# Patient Record
Sex: Female | Born: 1935 | Race: Black or African American | Hispanic: No | Smoking: Never smoker
Health system: Southern US, Community
[De-identification: ages and names within clinical notes are randomized; demographics above are authoritative.]

## PROBLEM LIST (undated history)

## (undated) DIAGNOSIS — Z8489 Family history of other specified conditions: Secondary | ICD-10-CM

## (undated) DIAGNOSIS — I219 Acute myocardial infarction, unspecified: Secondary | ICD-10-CM

## (undated) DIAGNOSIS — I350 Nonrheumatic aortic (valve) stenosis: Secondary | ICD-10-CM

## (undated) DIAGNOSIS — I2510 Atherosclerotic heart disease of native coronary artery without angina pectoris: Secondary | ICD-10-CM

## (undated) DIAGNOSIS — E785 Hyperlipidemia, unspecified: Secondary | ICD-10-CM

## (undated) DIAGNOSIS — I10 Essential (primary) hypertension: Secondary | ICD-10-CM

## (undated) DIAGNOSIS — Z8701 Personal history of pneumonia (recurrent): Secondary | ICD-10-CM

## (undated) DIAGNOSIS — M1990 Unspecified osteoarthritis, unspecified site: Secondary | ICD-10-CM

## (undated) DIAGNOSIS — R0602 Shortness of breath: Secondary | ICD-10-CM

## (undated) HISTORY — PX: KNEE ARTHROSCOPY: SUR90

## (undated) HISTORY — DX: Personal history of pneumonia (recurrent): Z87.01

## (undated) HISTORY — PX: PARTIAL HYSTERECTOMY: SHX80

## (undated) HISTORY — DX: Essential (primary) hypertension: I10

## (undated) HISTORY — DX: Atherosclerotic heart disease of native coronary artery without angina pectoris: I25.10

## (undated) HISTORY — DX: Acute myocardial infarction, unspecified: I21.9

## (undated) HISTORY — DX: Hyperlipidemia, unspecified: E78.5

## (undated) HISTORY — PX: TONSILLECTOMY: SUR1361

## (undated) HISTORY — DX: Nonrheumatic aortic (valve) stenosis: I35.0

## (undated) HISTORY — DX: Unspecified osteoarthritis, unspecified site: M19.90

---

## 1997-08-30 ENCOUNTER — Encounter: Admission: RE | Admit: 1997-08-30 | Discharge: 1997-11-28 | Payer: Self-pay | Admitting: Orthopedic Surgery

## 1997-11-26 ENCOUNTER — Encounter: Payer: Self-pay | Admitting: Orthopedic Surgery

## 1997-11-26 ENCOUNTER — Ambulatory Visit (HOSPITAL_COMMUNITY): Admission: RE | Admit: 1997-11-26 | Discharge: 1997-11-26 | Payer: Self-pay | Admitting: Orthopedic Surgery

## 1997-12-22 ENCOUNTER — Encounter: Admission: RE | Admit: 1997-12-22 | Discharge: 1998-02-02 | Payer: Self-pay | Admitting: Orthopedic Surgery

## 1998-01-21 ENCOUNTER — Ambulatory Visit (HOSPITAL_COMMUNITY): Admission: RE | Admit: 1998-01-21 | Discharge: 1998-01-21 | Payer: Self-pay | Admitting: Cardiology

## 1998-01-21 ENCOUNTER — Encounter: Payer: Self-pay | Admitting: Cardiology

## 1998-02-08 ENCOUNTER — Encounter: Payer: Self-pay | Admitting: Orthopedic Surgery

## 1998-02-08 ENCOUNTER — Ambulatory Visit (HOSPITAL_COMMUNITY): Admission: RE | Admit: 1998-02-08 | Discharge: 1998-02-09 | Payer: Self-pay | Admitting: Orthopedic Surgery

## 1998-03-14 ENCOUNTER — Encounter: Admission: RE | Admit: 1998-03-14 | Discharge: 1998-04-13 | Payer: Self-pay | Admitting: Orthopedic Surgery

## 2004-03-27 ENCOUNTER — Ambulatory Visit: Payer: Self-pay | Admitting: Internal Medicine

## 2004-07-20 ENCOUNTER — Ambulatory Visit: Payer: Self-pay | Admitting: Internal Medicine

## 2004-10-24 ENCOUNTER — Ambulatory Visit: Payer: Self-pay | Admitting: Internal Medicine

## 2005-02-01 ENCOUNTER — Ambulatory Visit: Payer: Self-pay | Admitting: Internal Medicine

## 2005-05-28 ENCOUNTER — Ambulatory Visit: Payer: Self-pay | Admitting: Internal Medicine

## 2005-07-23 ENCOUNTER — Ambulatory Visit: Payer: Self-pay | Admitting: Internal Medicine

## 2005-11-05 ENCOUNTER — Ambulatory Visit: Payer: Self-pay | Admitting: Internal Medicine

## 2005-12-03 ENCOUNTER — Ambulatory Visit: Payer: Self-pay | Admitting: Internal Medicine

## 2005-12-25 ENCOUNTER — Ambulatory Visit: Payer: Self-pay

## 2006-01-07 ENCOUNTER — Ambulatory Visit: Payer: Self-pay | Admitting: Internal Medicine

## 2006-01-07 LAB — CONVERTED CEMR LAB
BUN: 5 mg/dL — ABNORMAL LOW (ref 6–23)
Potassium: 4 meq/L (ref 3.5–5.1)

## 2006-02-19 DIAGNOSIS — Z8701 Personal history of pneumonia (recurrent): Secondary | ICD-10-CM

## 2006-02-19 HISTORY — DX: Personal history of pneumonia (recurrent): Z87.01

## 2006-04-01 ENCOUNTER — Ambulatory Visit: Payer: Self-pay | Admitting: Internal Medicine

## 2006-06-28 ENCOUNTER — Ambulatory Visit: Payer: Self-pay | Admitting: Internal Medicine

## 2006-07-16 ENCOUNTER — Ambulatory Visit: Payer: Self-pay | Admitting: Internal Medicine

## 2006-07-16 LAB — CONVERTED CEMR LAB
ALT: 23 units/L (ref 0–40)
AST: 39 units/L — ABNORMAL HIGH (ref 0–37)
Basophils Relative: 0.5 % (ref 0.0–1.0)
Bilirubin, Direct: 0.2 mg/dL (ref 0.0–0.3)
CO2: 30 meq/L (ref 19–32)
Calcium: 9.4 mg/dL (ref 8.4–10.5)
Chloride: 106 meq/L (ref 96–112)
Creatinine, Ser: 1.1 mg/dL (ref 0.4–1.2)
Eosinophils Relative: 6.9 % — ABNORMAL HIGH (ref 0.0–5.0)
Glucose, Bld: 130 mg/dL — ABNORMAL HIGH (ref 70–99)
Ketones, ur: NEGATIVE mg/dL
Lymphocytes Relative: 25.7 % (ref 12.0–46.0)
Nitrite: NEGATIVE
Platelets: 253 10*3/uL (ref 150–400)
RBC: 4.35 M/uL (ref 3.87–5.11)
RDW: 12.2 % (ref 11.5–14.6)
Specific Gravity, Urine: 1.025 (ref 1.000–1.03)
Total CHOL/HDL Ratio: 2.6
Total Protein: 7.2 g/dL (ref 6.0–8.3)
Urine Glucose: NEGATIVE mg/dL
WBC: 5.3 10*3/uL (ref 4.5–10.5)

## 2006-07-24 ENCOUNTER — Ambulatory Visit: Payer: Self-pay | Admitting: Internal Medicine

## 2006-11-06 ENCOUNTER — Ambulatory Visit: Payer: Self-pay | Admitting: Internal Medicine

## 2006-11-06 ENCOUNTER — Ambulatory Visit: Payer: Self-pay | Admitting: Family Medicine

## 2006-11-14 ENCOUNTER — Ambulatory Visit: Payer: Self-pay

## 2006-11-22 ENCOUNTER — Ambulatory Visit: Payer: Self-pay | Admitting: Internal Medicine

## 2007-01-29 ENCOUNTER — Encounter: Payer: Self-pay | Admitting: Internal Medicine

## 2007-03-13 ENCOUNTER — Telehealth (INDEPENDENT_AMBULATORY_CARE_PROVIDER_SITE_OTHER): Payer: Self-pay | Admitting: *Deleted

## 2007-03-17 ENCOUNTER — Ambulatory Visit: Payer: Self-pay | Admitting: Internal Medicine

## 2007-03-17 DIAGNOSIS — E785 Hyperlipidemia, unspecified: Secondary | ICD-10-CM

## 2007-03-17 DIAGNOSIS — J069 Acute upper respiratory infection, unspecified: Secondary | ICD-10-CM | POA: Insufficient documentation

## 2007-03-17 DIAGNOSIS — N760 Acute vaginitis: Secondary | ICD-10-CM | POA: Insufficient documentation

## 2007-03-17 DIAGNOSIS — I10 Essential (primary) hypertension: Secondary | ICD-10-CM | POA: Insufficient documentation

## 2007-04-14 ENCOUNTER — Encounter: Payer: Self-pay | Admitting: Internal Medicine

## 2007-04-14 ENCOUNTER — Telehealth: Payer: Self-pay | Admitting: Internal Medicine

## 2007-07-28 ENCOUNTER — Encounter: Payer: Self-pay | Admitting: Internal Medicine

## 2007-07-28 DIAGNOSIS — R930 Abnormal findings on diagnostic imaging of skull and head, not elsewhere classified: Secondary | ICD-10-CM

## 2007-07-29 ENCOUNTER — Ambulatory Visit: Payer: Self-pay | Admitting: Internal Medicine

## 2007-08-04 ENCOUNTER — Encounter: Payer: Self-pay | Admitting: Internal Medicine

## 2007-08-07 ENCOUNTER — Ambulatory Visit: Payer: Self-pay | Admitting: Cardiology

## 2007-11-17 ENCOUNTER — Encounter: Payer: Self-pay | Admitting: Internal Medicine

## 2008-01-16 ENCOUNTER — Telehealth (INDEPENDENT_AMBULATORY_CARE_PROVIDER_SITE_OTHER): Payer: Self-pay | Admitting: *Deleted

## 2008-01-16 ENCOUNTER — Telehealth: Payer: Self-pay | Admitting: Internal Medicine

## 2008-02-04 ENCOUNTER — Ambulatory Visit: Payer: Self-pay | Admitting: Internal Medicine

## 2008-02-04 DIAGNOSIS — M25569 Pain in unspecified knee: Secondary | ICD-10-CM | POA: Insufficient documentation

## 2008-03-11 ENCOUNTER — Ambulatory Visit: Payer: Self-pay | Admitting: Internal Medicine

## 2008-03-11 DIAGNOSIS — R609 Edema, unspecified: Secondary | ICD-10-CM

## 2008-03-11 DIAGNOSIS — M1990 Unspecified osteoarthritis, unspecified site: Secondary | ICD-10-CM | POA: Insufficient documentation

## 2008-03-30 ENCOUNTER — Telehealth: Payer: Self-pay | Admitting: Internal Medicine

## 2008-04-07 ENCOUNTER — Encounter: Payer: Self-pay | Admitting: Internal Medicine

## 2008-04-21 ENCOUNTER — Ambulatory Visit: Payer: Self-pay | Admitting: Internal Medicine

## 2008-04-23 ENCOUNTER — Ambulatory Visit: Payer: Self-pay | Admitting: Internal Medicine

## 2008-08-30 ENCOUNTER — Ambulatory Visit: Payer: Self-pay | Admitting: Internal Medicine

## 2008-08-30 LAB — CONVERTED CEMR LAB
ALT: 17 units/L (ref 0–35)
AST: 28 units/L (ref 0–37)
BUN: 8 mg/dL (ref 6–23)
Basophils Absolute: 0 10*3/uL (ref 0.0–0.1)
Basophils Relative: 0.5 % (ref 0.0–3.0)
Bilirubin Urine: NEGATIVE
Bilirubin, Direct: 0.1 mg/dL (ref 0.0–0.3)
CO2: 30 meq/L (ref 19–32)
Chloride: 106 meq/L (ref 96–112)
Cholesterol: 220 mg/dL — ABNORMAL HIGH (ref 0–200)
Creatinine, Ser: 0.9 mg/dL (ref 0.4–1.2)
Direct LDL: 125 mg/dL
Eosinophils Relative: 7 % — ABNORMAL HIGH (ref 0.0–5.0)
HCT: 41.9 % (ref 36.0–46.0)
Hemoglobin: 14.5 g/dL (ref 12.0–15.0)
Ketones, ur: NEGATIVE mg/dL
Lymphs Abs: 1.3 10*3/uL (ref 0.7–4.0)
Monocytes Relative: 9.7 % (ref 3.0–12.0)
Neutro Abs: 3.1 10*3/uL (ref 1.4–7.7)
Potassium: 3.6 meq/L (ref 3.5–5.1)
RBC: 4.52 M/uL (ref 3.87–5.11)
RDW: 12.5 % (ref 11.5–14.6)
Specific Gravity, Urine: 1.01 (ref 1.000–1.030)
TSH: 1.45 microintl units/mL (ref 0.35–5.50)
Total Protein: 7.5 g/dL (ref 6.0–8.3)
Triglycerides: 185 mg/dL — ABNORMAL HIGH (ref 0.0–149.0)
Urine Glucose: NEGATIVE mg/dL
Vit D, 25-Hydroxy: 38 ng/mL (ref 30–89)
pH: 7 (ref 5.0–8.0)

## 2008-09-02 ENCOUNTER — Ambulatory Visit: Payer: Self-pay | Admitting: Internal Medicine

## 2008-09-02 DIAGNOSIS — R739 Hyperglycemia, unspecified: Secondary | ICD-10-CM

## 2008-11-10 ENCOUNTER — Telehealth: Payer: Self-pay | Admitting: Internal Medicine

## 2008-11-10 ENCOUNTER — Encounter: Payer: Self-pay | Admitting: Internal Medicine

## 2009-02-07 ENCOUNTER — Ambulatory Visit: Payer: Self-pay | Admitting: Internal Medicine

## 2009-02-08 ENCOUNTER — Telehealth: Payer: Self-pay | Admitting: Internal Medicine

## 2009-02-08 LAB — CONVERTED CEMR LAB
AST: 30 units/L (ref 0–37)
Albumin: 4 g/dL (ref 3.5–5.2)
Alkaline Phosphatase: 64 units/L (ref 39–117)
Calcium: 9.6 mg/dL (ref 8.4–10.5)
GFR calc non Af Amer: 69.91 mL/min (ref >60)
HDL: 59 mg/dL (ref >39.00)
Hgb A1c MFr Bld: 6.1 % (ref 4.6–6.5)
LDL Cholesterol: 106 mg/dL — ABNORMAL HIGH (ref 0–99)
Potassium: 3.1 meq/L — ABNORMAL LOW (ref 3.5–5.1)
Sodium: 140 meq/L (ref 135–145)
Total Bilirubin: 1 mg/dL (ref 0.3–1.2)
VLDL: 20.2 mg/dL (ref 0.0–40.0)

## 2009-02-17 ENCOUNTER — Ambulatory Visit: Payer: Self-pay | Admitting: Internal Medicine

## 2009-02-17 DIAGNOSIS — E876 Hypokalemia: Secondary | ICD-10-CM

## 2009-02-17 DIAGNOSIS — M2550 Pain in unspecified joint: Secondary | ICD-10-CM

## 2009-05-12 ENCOUNTER — Ambulatory Visit: Payer: Self-pay | Admitting: Internal Medicine

## 2009-05-12 LAB — CONVERTED CEMR LAB
CO2: 29 meq/L (ref 19–32)
Calcium: 9.7 mg/dL (ref 8.4–10.5)
Cholesterol: 290 mg/dL — ABNORMAL HIGH (ref 0–200)
Creatinine, Ser: 0.8 mg/dL (ref 0.4–1.2)
Direct LDL: 174 mg/dL
GFR calc non Af Amer: 90.37 mL/min (ref >60)
Glucose, Bld: 119 mg/dL — ABNORMAL HIGH (ref 70–99)
Sodium: 142 meq/L (ref 135–145)
Total CHOL/HDL Ratio: 5
Triglycerides: 290 mg/dL — ABNORMAL HIGH (ref 0.0–149.0)

## 2009-05-19 ENCOUNTER — Ambulatory Visit: Payer: Self-pay | Admitting: Internal Medicine

## 2009-07-04 ENCOUNTER — Ambulatory Visit: Payer: Self-pay | Admitting: Internal Medicine

## 2009-07-04 DIAGNOSIS — H9209 Otalgia, unspecified ear: Secondary | ICD-10-CM | POA: Insufficient documentation

## 2009-07-04 DIAGNOSIS — G500 Trigeminal neuralgia: Secondary | ICD-10-CM | POA: Insufficient documentation

## 2009-07-19 ENCOUNTER — Telehealth: Payer: Self-pay | Admitting: Internal Medicine

## 2009-09-02 ENCOUNTER — Ambulatory Visit: Payer: Self-pay | Admitting: Internal Medicine

## 2009-09-02 LAB — CONVERTED CEMR LAB
Albumin: 4 g/dL (ref 3.5–5.2)
CO2: 29 meq/L (ref 19–32)
Chloride: 111 meq/L (ref 96–112)
Cholesterol: 191 mg/dL (ref 0–200)
Hgb A1c MFr Bld: 6 % (ref 4.6–6.5)
LDL Cholesterol: 101 mg/dL — ABNORMAL HIGH (ref 0–99)
Potassium: 3.6 meq/L (ref 3.5–5.1)
Total CHOL/HDL Ratio: 3
Total Protein: 6.9 g/dL (ref 6.0–8.3)
Triglycerides: 130 mg/dL (ref 0.0–149.0)
VLDL: 26 mg/dL (ref 0.0–40.0)

## 2009-09-13 ENCOUNTER — Telehealth: Payer: Self-pay | Admitting: Internal Medicine

## 2009-09-14 ENCOUNTER — Ambulatory Visit: Payer: Self-pay | Admitting: Internal Medicine

## 2009-12-05 ENCOUNTER — Ambulatory Visit: Payer: Self-pay | Admitting: Internal Medicine

## 2009-12-05 ENCOUNTER — Encounter: Payer: Self-pay | Admitting: Internal Medicine

## 2009-12-05 DIAGNOSIS — J40 Bronchitis, not specified as acute or chronic: Secondary | ICD-10-CM | POA: Insufficient documentation

## 2010-01-02 ENCOUNTER — Telehealth: Payer: Self-pay | Admitting: Internal Medicine

## 2010-01-18 ENCOUNTER — Encounter: Payer: Self-pay | Admitting: Internal Medicine

## 2010-01-18 ENCOUNTER — Ambulatory Visit: Payer: Self-pay | Admitting: Internal Medicine

## 2010-01-18 DIAGNOSIS — R32 Unspecified urinary incontinence: Secondary | ICD-10-CM

## 2010-01-18 DIAGNOSIS — R509 Fever, unspecified: Secondary | ICD-10-CM

## 2010-01-18 DIAGNOSIS — R079 Chest pain, unspecified: Secondary | ICD-10-CM

## 2010-01-19 LAB — CONVERTED CEMR LAB
Nitrite: POSITIVE
Specific Gravity, Urine: >=1.03 (ref 1.000–1.030)
Urine Glucose: NEGATIVE mg/dL
Urobilinogen, UA: 0.2 (ref 0.0–1.0)

## 2010-01-31 LAB — CONVERTED CEMR LAB
AST: 25 units/L (ref 0–37)
Albumin: 3.9 g/dL (ref 3.5–5.2)
Alkaline Phosphatase: 82 units/L (ref 39–117)
Basophils Absolute: 0.1 10*3/uL (ref 0.0–0.1)
Basophils Relative: 0.9 % (ref 0.0–3.0)
Calcium: 9.9 mg/dL (ref 8.4–10.5)
Direct LDL: 168 mg/dL
Eosinophils Relative: 7.4 % — ABNORMAL HIGH (ref 0.0–5.0)
GFR calc non Af Amer: 80.8 mL/min (ref >60.00)
HDL: 56.2 mg/dL (ref >39.00)
Hemoglobin: 14 g/dL (ref 12.0–15.0)
Ketones, ur: NEGATIVE mg/dL
Lymphocytes Relative: 22.9 % (ref 12.0–46.0)
Monocytes Relative: 12.7 % — ABNORMAL HIGH (ref 3.0–12.0)
Neutro Abs: 3.2 10*3/uL (ref 1.4–7.7)
RBC: 4.46 M/uL (ref 3.87–5.11)
RDW: 13.4 % (ref 11.5–14.6)
Sodium: 143 meq/L (ref 135–145)
Specific Gravity, Urine: 1.02 (ref 1.000–1.030)
Total CHOL/HDL Ratio: 5
Triglycerides: 174 mg/dL — ABNORMAL HIGH (ref 0.0–149.0)
Urine Glucose: NEGATIVE mg/dL
Urobilinogen, UA: 0.2 (ref 0.0–1.0)
WBC: 5.7 10*3/uL (ref 4.5–10.5)

## 2010-02-02 ENCOUNTER — Ambulatory Visit: Payer: Self-pay | Admitting: Internal Medicine

## 2010-02-09 ENCOUNTER — Telehealth: Payer: Self-pay | Admitting: Internal Medicine

## 2010-02-09 ENCOUNTER — Ambulatory Visit: Payer: Self-pay | Admitting: Internal Medicine

## 2010-02-09 DIAGNOSIS — I2510 Atherosclerotic heart disease of native coronary artery without angina pectoris: Secondary | ICD-10-CM

## 2010-02-16 ENCOUNTER — Encounter: Payer: Self-pay | Admitting: Internal Medicine

## 2010-03-21 NOTE — Progress Notes (Signed)
Summary: temazepam  Phone Note Refill Request Message from:  Scriptline on Jul 19, 2009 9:39 AM  Refills Requested: Medication #1:  TEMAZEPAM 15 MG CAPS 1 to 2 at bedtime as needed # [address] [facility] [place] [phone] Last ov 07/04/09  Next Appointment Scheduled: 09/14/09 Initial call taken by: Orlan Leavens,  Jul 19, 2009 9:41 AM Caller: [facility] #[id number]*  Follow-up for Phone Call        Is this ok to refill ? Follow-up by: Orlan Leavens,  Jul 19, 2009 9:41 AM  Additional Follow-up for Phone Call Additional follow up Details #1::        ok 6 ref Additional Follow-up by: Tresa Garter MD,  Jul 19, 2009 2:23 PM    Additional Follow-up for Phone Call Additional follow up Details #2::    Notified pharm spoke with Abbie ok # 60 with 5 refills Follow-up by: Orlan Leavens,  Jul 19, 2009 3:36 PM  Prescriptions: TEMAZEPAM 15 MG CAPS (TEMAZEPAM) 1 to 2 at bedtime as needed  #60 x 5   Entered by:   Orlan Leavens   Authorized by:   Tresa Garter MD   Signed by:   Orlan Leavens on 07/19/2009   Method used:   Telephoned to ...       [facility]. [phone]* (retail)       [address]       [place], [place]  [address]       Ph: [phone]       Fax: [phone]   RxID:   [id number]

## 2010-03-21 NOTE — Assessment & Plan Note (Signed)
Summary: 4 MO ROV /NWS  #   Vital Signs:  Patient profile:   76 year old female Height:      63 inches Weight:      181 pounds BMI:     32.18 O2 Sat:      96 % on Room air Temp:     98.5 degrees F oral Pulse rate:   75 / minute Pulse rhythm:   regular Resp:     16 per minute BP sitting:   140 / 70  (left arm) Cuff size:   regular  Vitals Entered By: Lanier Prude, CMA(AAMA) (September 14, 2009 10:06 AM)  O2 Flow:  Room air CC: 4 MO F/U Is Patient Diabetic? No Comments pt is currently taking Lyrica but soon will change to gabapentin when she completes Lyrica due to non coverage   CC:  4 MO F/U.  History of Present Illness: The patient presents for a follow up of hypertension, diabetes, hyperlipidemia, knee pain   Current Medications (verified): 1)  Maxzide-25 37.5-25 Mg Tabs (Triamterene-Hctz) .Marland Kitchen.. 1 Once Daily 2)  Norvasc 5 Mg Tabs (Amlodipine Besylate) .Marland Kitchen.. 1 Once Daily 3)  Temazepam 15 Mg Caps (Temazepam) .Marland Kitchen.. 1 To 2 At Bedtime As Needed 4)  Alprazolam 0.25 Mg Tabs (Alprazolam) .Marland Kitchen.. 1 Two Times A Day As Needed For Anxiety 5)  Vitamin D3 1000 Unit  Tabs (Cholecalciferol) .Marland Kitchen.. 1 Qd 6)  Hydrocodone-Acetaminophen 5-325 Mg Tabs (Hydrocodone-Acetaminophen) .Marland Kitchen.. 1 By Mouth Up To 4 Times Per Day As Needed For Pain 7)  Aspirin 81 Mg Tabs (Aspirin) .... Once Daily 8)  Toprol Xl 100 Mg Xr24h-Tab (Metoprolol Succinate) .... Once Daily 9)  Zantac 150 Maximum Strength 150 Mg Tabs (Ranitidine Hcl) .... Once Daily 10)  Klor-Con M20 20 Meq Cr-Tabs (Potassium Chloride Crys Cr) .Marland Kitchen.. 1 Once Daily 11)  Crestor 10 Mg Tabs (Rosuvastatin Calcium) .Marland Kitchen.. 1 By Mouth Once Daily For Cholesterol 12)  Prednisone 10 Mg Tabs (Prednisone) .... Take 40mg  Qd For 3 Days, Then 20 Mg Qd For 3 Days, Then 10mg  Qd For 6 Days, Then Stop. Take Pc. 13)  Lyrica 75 Mg Caps (Pregabalin) .Marland Kitchen.. 1 By Mouth Three Times A Day For Pain 14)  Gabapentin 100 Mg Caps (Gabapentin) .Marland Kitchen.. 1-2 By Mouth At Bedtime As Needed  Numbness  Allergies (verified): 1)  ! Iodine 2)  ! 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    Physical Exam  General:  NAD Eyes:  No corneal or conjunctival inflammation noted. EOMI. Perrla. Funduscopic exam benign, without hemorrhages, exudates or papilledema. Vision grossly normal. Mouth:  Oral mucosa and oropharynx without lesions or exudates.  Teeth in good repair. Neck:  No deformities, masses, or tenderness noted. Lungs:  Normal respiratory effort, chest expands symmetrically. Lungs are clear to auscultation, no crackles or wheezes. Heart:  Normal rate and regular rhythm. S1 and S2 normal without gallop, murmur, click, rub or other extra sounds. Abdomen:  Bowel sounds positive,abdomen soft and non-tender without masses, organomegaly or hernias noted. Msk:  No deformity or scoliosis noted of thoracic or lumbar spine.  Knees tender w/ROM R>>L Neurologic:  No cranial nerve deficits noted. Station and gait are normal. Plantar reflexes are down-going bilaterally. DTRs are symmetrical throughout. Sensory, motor and coordinative functions appear intact. Skin:  Intact without suspicious lesions or rashes Inguinal Nodes:  no R inguinal adenopathy.   Psych:  Cognition and judgment appear intact. Alert and cooperative with normal attention span and concentration. No apparent delusions, illusions, hallucinations   Impression & Recommendations:  Problem # 1:  KNEE PAIN (ICD-719.46) Assessment Improved Lyrica helped Her updated medication list for this problem includes:    Hydrocodone-acetaminophen 5-325 Mg Tabs (Hydrocodone-acetaminophen) .Marland Kitchen... 1 by mouth up to 4 times per day as needed for pain    Aspirin 81 Mg Tabs (Aspirin) ..... Once  daily  Problem # 2:  EAR PAIN (ICD-388.70) resolved  Problem # 3:  NEURALGIA, TRIGEMINAL (ICD-350.1) resolved  Problem # 4:  PURE HYPERCHOLESTEROLEMIA (ICD-272.0) Assessment: Improved  Her updated medication list for this problem includes:    Crestor 10 Mg Tabs (Rosuvastatin calcium) .Marland Kitchen... 1 by mouth once daily for cholesterol  Problem # 5:  HYPERGLYCEMIA (ICD-790.29) Assessment: Improved  Problem # 6:  HYPERTENSION (ICD-401.9) Assessment: Improved  Her updated medication list for this problem includes:    Maxzide-25 37.5-25 Mg Tabs (Triamterene-hctz) .Marland Kitchen... 1 once daily    Norvasc 5 Mg Tabs (Amlodipine besylate) .Marland Kitchen... 1 once daily    Toprol Xl 100 Mg Xr24h-tab (Metoprolol succinate) ..... Once daily  Complete Medication List: 1)  Maxzide-25 37.5-25 Mg Tabs (Triamterene-hctz) .Marland Kitchen.. 1 once daily 2)  Norvasc 5 Mg Tabs (Amlodipine besylate) .Marland Kitchen.. 1 once daily 3)  Temazepam 15 Mg Caps (Temazepam) .Marland Kitchen.. 1 to 2 at bedtime as needed 4)  Alprazolam 0.25 Mg Tabs (Alprazolam) .Marland Kitchen.. 1 two times a day as needed for anxiety 5)  Vitamin D3 1000 Unit Tabs (Cholecalciferol) .Marland Kitchen.. 1 qd 6)  Hydrocodone-acetaminophen 5-325 Mg Tabs (Hydrocodone-acetaminophen) .Marland Kitchen.. 1 by mouth up to 4 times per day as needed for pain 7)  Aspirin 81 Mg Tabs (Aspirin) .... Once daily 8)  Toprol Xl 100 Mg Xr24h-tab (Metoprolol succinate) .... Once daily 9)  Zantac 150 Maximum Strength 150 Mg Tabs (Ranitidine hcl) .... Once daily 10)  Klor-con M20 20 Meq Cr-tabs (Potassium chloride crys cr) .Marland Kitchen.. 1 once daily 11)  Crestor 10 Mg Tabs (Rosuvastatin calcium) .Marland Kitchen.. 1 by mouth once daily for cholesterol 12)  Prednisone 10 Mg Tabs (Prednisone) .... Take 40mg  qd for 3 days, then 20 mg qd for 3 days, then 10mg  qd for 6 days, then stop. take pc. 13)  Lyrica 75 Mg Caps (Pregabalin) .Marland Kitchen.. 1 by mouth three times a day for pain 14)  Gabapentin 100 Mg Caps (Gabapentin) .Marland Kitchen.. 1-2 by mouth at bedtime as needed numbness  Patient Instructions: 1)   Please schedule a follow-up appointment in 6 months well w/labs. 2)  HbgA1C prior to visit, ICD-9:790.29

## 2010-03-21 NOTE — Assessment & Plan Note (Signed)
Summary: STOMACH VIRUS/ HURTS UNDER RIBS AND BACK/ NO FEVER /NWS   Vital Signs:  Patient profile:   76 year old female Height:      63 inches (160.02 cm) Weight:      181 pounds (82.27 kg) O2 Sat:      98 % on Room air Temp:     97.8 degrees F (36.56 degrees C) oral Pulse rate:   68 / minute BP sitting:   132 / 62  (left arm) Cuff size:   regular  Vitals Entered By: Orlan Leavens RMA (December 05, 2009 10:39 AM)  O2 Flow:  Room air CC: ? stomach virus x's 1 week/ diarrhea & vomiting/ Now pt has develop pain (L) rib/ cough Is Patient Diabetic? No Pain Assessment Patient in pain? yes     Location: (L) rib Type: soreness   Primary Care Provider:  Tresa Garter MD  CC:  ? stomach virus x's 1 week/ diarrhea & vomiting/ Now pt has develop pain (L) rib/ cough.  History of Present Illness: here with "stomach virus" onset symptoms 7 days ago a/w diarrhea and vomitting - last episode this AM no abd pain or SS chest pain but aches at left lower rib cage concerned about PNA given hx same with these symptoms in 2008 denies fever, melena or BRBPR - no hematemesiss also a/w cough, nonproductive and overwhelming fatigue symptoms not improved with use of OTC meds +sick contacts at work with same symptoms   Clinical Review Panels:  Lipid Management   Cholesterol:  191 (09/02/2009)   LDL (bad choesterol):  101 (09/02/2009)   HDL (good cholesterol):  63.70 (09/02/2009)  CBC   WBC:  5.3 (08/30/2008)   RBC:  4.52 (08/30/2008)   Hgb:  14.5 (08/30/2008)   Hct:  41.9 (08/30/2008)   Platelets:  260.0 (08/30/2008)   MCV  92.8 (08/30/2008)   MCHC  34.6 (08/30/2008)   RDW  12.5 (08/30/2008)   PMN:  59.2 (08/30/2008)   Lymphs:  23.6 (08/30/2008)   Monos:  9.7 (08/30/2008)   Eosinophils:  7.0 (08/30/2008)   Basophil:  0.5 (08/30/2008)  Complete Metabolic Panel   Glucose:  118 (09/02/2009)   Sodium:  142 (09/02/2009)   Potassium:  3.6 (09/02/2009)   Chloride:  111 (09/02/2009)   CO2:  29 (09/02/2009)   BUN:  10 (09/02/2009)   Creatinine:  0.8 (09/02/2009)   Albumin:  4.0 (09/02/2009)   Total Protein:  6.9 (09/02/2009)   Calcium:  9.5 (09/02/2009)   Total Bili:  0.8 (09/02/2009)   Alk Phos:  77 (09/02/2009)   SGPT (ALT):  12 (09/02/2009)   SGOT (AST):  23 (09/02/2009)   Current Medications (verified): 1)  Maxzide-25 37.5-25 Mg Tabs (Triamterene-Hctz) .Marland Kitchen.. 1 Once Daily 2)  Norvasc 5 Mg Tabs (Amlodipine Besylate) .Marland Kitchen.. 1 Once Daily 3)  Temazepam 15 Mg Caps (Temazepam) .Marland Kitchen.. 1 To 2 At Bedtime As Needed 4)  Alprazolam 0.25 Mg Tabs (Alprazolam) .Marland Kitchen.. 1 Two Times A Day As Needed For Anxiety 5)  Vitamin D3 1000 Unit  Tabs (Cholecalciferol) .Marland Kitchen.. 1 Qd 6)  Hydrocodone-Acetaminophen 5-325 Mg Tabs (Hydrocodone-Acetaminophen) .Marland Kitchen.. 1 By Mouth Up To 4 Times Per Day As Needed For Pain 7)  Aspirin 81 Mg Tabs (Aspirin) .... Once Daily 8)  Toprol Xl 100 Mg Xr24h-Tab (Metoprolol Succinate) .... Once Daily 9)  Zantac 150 Maximum Strength 150 Mg Tabs (Ranitidine Hcl) .... Once Daily 10)  Klor-Con M20 20 Meq Cr-Tabs (Potassium Chloride Crys Cr) .Marland Kitchen.. 1 Once Daily  11)  Crestor 10 Mg Tabs (Rosuvastatin Calcium) .Marland Kitchen.. 1 By Mouth Once Daily For Cholesterol 12)  Prednisone 10 Mg Tabs (Prednisone) .... Take 40mg  Qd For 3 Days, Then 20 Mg Qd For 3 Days, Then 10mg  Qd For 6 Days, Then Stop. Take Pc. 13)  Lyrica 75 Mg Caps (Pregabalin) .Marland Kitchen.. 1 By Mouth Three Times A Day For Pain 14)  Gabapentin 100 Mg Caps (Gabapentin) .Marland Kitchen.. 1-2 By Mouth At Bedtime As Needed Numbness  Allergies (verified): 1)  ! Iodine 2)  ! Ibuprofen 3)  Simvastatin  Past History:  Past Medical History: H/o pneumonia 2008   Hyperlipidemia Hypertension  Osteoarthritis R knee injury 2009  Review of Systems  The patient denies syncope, headaches, and hematochezia.    Physical Exam  General:  alert, well-developed, well-nourished, and cooperative to examination.   nontoxic but fatigued Lungs:  normal respiratory  effort, no intercostal retractions or use of accessory muscles; normal breath sounds bilaterally - no crackles and no wheezes.    Heart:  normal rate, regular rhythm, no murmur, and no rub. BLE without edema.  Abdomen:  soft, non-tender, normal bowel sounds, no distention; no masses and no appreciable hepatomegaly or splenomegaly.     Impression & Recommendations:  Problem # 1:  BRONCHITIS NOT SPECIFIED AS ACUTE OR CHRONIC (ICD-490)  check cxr and start abx now for bronchitis symptoms given pulm hx also symptoms relief of cough and n/v symptoms with phenergan+ codiene syrup out of work this week - note done  Her updated medication list for this problem includes:    Ceftin 500 Mg Tabs (Cefuroxime axetil) .Marland Kitchen... 1 by mouth two times a day x 7 days    Promethazine-codeine 6.25-10 Mg/80ml Syrp (Promethazine-codeine) .Marland KitchenMarland KitchenMarland KitchenMarland Kitchen 5 cc by mouth every 4 hours as needed for cough symptoms  Orders: T-2 View CXR (71020TC) Rocephin  250mg  (H0865) Admin of Therapeutic Inj  intramuscular or subcutaneous (78469)  Take antibiotics and other medications as directed. Encouraged to push clear liquids, get enough rest, and take acetaminophen as needed. To be seen in 5-7 days if no improvement, sooner if worse.  Complete Medication List: 1)  Maxzide-25 37.5-25 Mg Tabs (Triamterene-hctz) .Marland Kitchen.. 1 once daily 2)  Norvasc 5 Mg Tabs (Amlodipine besylate) .Marland Kitchen.. 1 once daily 3)  Temazepam 15 Mg Caps (Temazepam) .Marland Kitchen.. 1 to 2 at bedtime as needed 4)  Alprazolam 0.25 Mg Tabs (Alprazolam) .Marland Kitchen.. 1 two times a day as needed for anxiety 5)  Vitamin D3 1000 Unit Tabs (Cholecalciferol) .Marland Kitchen.. 1 qd 6)  Hydrocodone-acetaminophen 5-325 Mg Tabs (Hydrocodone-acetaminophen) .Marland Kitchen.. 1 by mouth up to 4 times per day as needed for pain 7)  Aspirin 81 Mg Tabs (Aspirin) .... Once daily 8)  Toprol Xl 100 Mg Xr24h-tab (Metoprolol succinate) .... Once daily 9)  Zantac 150 Maximum Strength 150 Mg Tabs (Ranitidine hcl) .... Once daily 10)  Klor-con M20  20 Meq Cr-tabs (Potassium chloride crys cr) .Marland Kitchen.. 1 once daily 11)  Crestor 10 Mg Tabs (Rosuvastatin calcium) .Marland Kitchen.. 1 by mouth once daily for cholesterol 12)  Prednisone 10 Mg Tabs (Prednisone) .... Take 40mg  qd for 3 days, then 20 mg qd for 3 days, then 10mg  qd for 6 days, then stop. take pc. 13)  Lyrica 75 Mg Caps (Pregabalin) .Marland Kitchen.. 1 by mouth three times a day for pain 14)  Gabapentin 100 Mg Caps (Gabapentin) .Marland Kitchen.. 1-2 by mouth at bedtime as needed numbness 15)  Ceftin 500 Mg Tabs (Cefuroxime axetil) .Marland Kitchen.. 1 by mouth two times a day x  7 days 16)  Promethazine-codeine 6.25-10 Mg/30ml Syrp (Promethazine-codeine) .... 5 cc by mouth every 4 hours as needed for cough symptoms  Patient Instructions: 1)  it was good to see you today. 2)  shot of antibioitcs today - also start ceftin antibiotics and cough syrup - your prescriptions have been given to you to submit to your pharmacy. Please take as directed. Contact our office if you believe you're having problems with the medication(s).  3)  xray ordered today - your results will be called to you after review in 48 hours from the time of test completion 4)  work note for this week - done as requested 5)  Get plenty of rest, drink lots of clear liquids, and use Tylenol or Ibuprofen for fever and comfort. Return in 7-10 days if you're not better:sooner if you're feeling worse. Prescriptions: PROMETHAZINE-CODEINE 6.25-10 MG/5ML SYRP (PROMETHAZINE-CODEINE) 5 cc by mouth every 4 hours as needed for cough symptoms  #200cc x 0   Entered and Authorized by:   Newt Lukes MD   Signed by:   Newt Lukes MD on 12/05/2009   Method used:   Print then Give to Patient   RxID:   [id number] CEFTIN 500 MG TABS (CEFUROXIME AXETIL) 1 by mouth two times a day x 7 days  #14 x 0   Entered and Authorized by:   Newt Lukes MD   Signed by:   Newt Lukes MD on 12/05/2009   Method used:   Print then Give to Patient   RxID:    [id number]    Medication Administration  Injection # 1:    Medication: Rocephin  250mg     Diagnosis: BRONCHITIS NOT SPECIFIED AS ACUTE OR CHRONIC (ICD-490)    Route: IM    Site: LUOQ gluteus    Exp Date: 06/2012    Lot #: [id number]    Mfr: NOVAPLUS    Comments: Gave total of 1000mg     Patient tolerated injection without complications    Given by: Orlan Leavens RMA (December 05, 2009 11:29 AM)  Orders Added: 1)  Est. Patient Level IV [06237] 2)  T-2 View CXR [71020TC] 3)  Rocephin  250mg  [J0696] 4)  Admin of Therapeutic Inj  intramuscular or subcutaneous [62831]

## 2010-03-21 NOTE — Assessment & Plan Note (Signed)
Summary: ?bladder inf/cd   Vital Signs:  Patient profile:   76 year old female Height:      63 inches Weight:      177 pounds BMI:     31.47 Temp:     99.2 degrees F oral Pulse rate:   72 / minute Pulse rhythm:   regular Resp:     16 per minute BP sitting:   148 / 70  (left arm) Cuff size:   regular  Vitals Entered By: Lanier Prude, CMA(AAMA) (January 18, 2010 9:28 AM) CC: urinary frequency/urgency and arm/chest pain after urinating X 3 days Is Patient Diabetic? No   Primary Care Provider:  Tresa Garter MD  CC:  urinary frequency/urgency and arm/chest pain after urinating X 3 days.  History of Present Illness: C/o urinary urgency and frequency x few days, incontinence. When she urinates she would have pain in B arms and chest.  Current Medications (verified): 1)  Maxzide-25 37.5-25 Mg Tabs (Triamterene-Hctz) .Marland Kitchen.. 1 Once Daily 2)  Norvasc 5 Mg Tabs (Amlodipine Besylate) .Marland Kitchen.. 1 Once Daily 3)  Temazepam 15 Mg Caps (Temazepam) .Marland Kitchen.. 1 To 2 At Bedtime As Needed 4)  Alprazolam 0.25 Mg Tabs (Alprazolam) .Marland Kitchen.. 1 Two Times A Day As Needed For Anxiety 5)  Vitamin D3 1000 Unit  Tabs (Cholecalciferol) .Marland Kitchen.. 1 Qd 6)  Hydrocodone-Acetaminophen 5-325 Mg Tabs (Hydrocodone-Acetaminophen) .Marland Kitchen.. 1 By Mouth Up To 4 Times Per Day As Needed For Pain 7)  Aspirin 81 Mg Tabs (Aspirin) .... Once Daily 8)  Toprol Xl 100 Mg Xr24h-Tab (Metoprolol Succinate) .... Once Daily 9)  Zantac 150 Maximum Strength 150 Mg Tabs (Ranitidine Hcl) .... Once Daily 10)  Klor-Con M20 20 Meq Cr-Tabs (Potassium Chloride Crys Cr) .Marland Kitchen.. 1 Once Daily 11)  Crestor 10 Mg Tabs (Rosuvastatin Calcium) .Marland Kitchen.. 1 By Mouth Once Daily For Cholesterol 12)  Prednisone 10 Mg Tabs (Prednisone) .... Take 40mg  Qd For 3 Days, Then 20 Mg Qd For 3 Days, Then 10mg  Qd For 6 Days, Then Stop. Take Pc. 13)  Lyrica 75 Mg Caps (Pregabalin) .Marland Kitchen.. 1 By Mouth Three Times A Day For Pain 14)  Gabapentin 100 Mg Caps (Gabapentin) .Marland Kitchen.. 1-2 By Mouth At  Bedtime As Needed Numbness 15)  Promethazine-Codeine 6.25-10 Mg/58ml Syrp (Promethazine-Codeine) .... 5 Cc By Mouth Every 4 Hours As Needed For Cough Symptoms  Allergies (verified): 1)  ! Iodine 2)  ! 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    Physical Exam  General:  alert, well-developed, well-nourished, and cooperative to examination.   nontoxic but fatigued Nose:  External nasal examination shows no deformity or inflammation. Nasal mucosa are pink and moist without lesions or exudates. Mouth:  Oral mucosa and oropharynx without lesions or exudates.  Teeth in good repair. Lungs:  normal respiratory effort, no intercostal retractions or use of accessory muscles; normal breath sounds bilaterally - no crackles and no wheezes.    Heart:  normal rate, regular rhythm, no murmur, and no rub. BLE without edema.  Abdomen:  soft, non-tender, normal bowel sounds, no distention; no masses and no appreciable hepatomegaly or splenomegaly.   Msk:  No deformity or scoliosis noted of thoracic or lumbar  spine. Extremities:  No edema Skin:  Intact without suspicious lesions or rashes Psych:  Cognition and judgment appear intact. Alert and cooperative with normal attention span and concentration. No apparent delusions, illusions, hallucinations   Impression & Recommendations:  Problem # 1:  URINARY INCONTINENCE (ICD-788.30) poss UTI Assessment New  Orders: TLB-Udip ONLY (81003-UDIP)  Problem # 2:  CHEST PAIN (ICD-786.50) w/urination only - atypical; due to #4 Assessment: New  Orders: EKG w/ Interpretation (93000) TLB-Udip ONLY  (81003-UDIP)  Problem # 3:  FEVER UNSPECIFIED (ICD-780.60) Assessment: Improved  Orders: TLB-Udip ONLY (81003-UDIP)  Problem # 4:  UTI Assessment: New Cipro Toviaz  Complete Medication List: 1)  Maxzide-25 37.5-25 Mg Tabs (Triamterene-hctz) .Marland Kitchen.. 1 once daily 2)  Norvasc 5 Mg Tabs (Amlodipine besylate) .Marland Kitchen.. 1 once daily 3)  Temazepam 15 Mg Caps (Temazepam) .Marland Kitchen.. 1 to 2 at bedtime as needed 4)  Alprazolam 0.25 Mg Tabs (Alprazolam) .Marland Kitchen.. 1 two times a day as needed for anxiety 5)  Vitamin D3 1000 Unit Tabs (Cholecalciferol) .Marland Kitchen.. 1 qd 6)  Hydrocodone-acetaminophen 5-325 Mg Tabs (Hydrocodone-acetaminophen) .Marland Kitchen.. 1 by mouth up to 4 times per day as needed for pain 7)  Aspirin 81 Mg Tabs (Aspirin) .... Once daily 8)  Toprol Xl 100 Mg Xr24h-tab (Metoprolol succinate) .... Once daily 9)  Zantac 150 Maximum Strength 150 Mg Tabs (Ranitidine hcl) .... Once daily 10)  Klor-con M20 20 Meq Cr-tabs (Potassium chloride crys cr) .Marland Kitchen.. 1 once daily 11)  Crestor 10 Mg Tabs (Rosuvastatin calcium) .Marland Kitchen.. 1 by mouth once daily for cholesterol 12)  Prednisone 10 Mg Tabs (Prednisone) .... Take 40mg  qd for 3 days, then 20 mg qd for 3 days, then 10mg  qd for 6 days, then stop. take pc. 13)  Lyrica 75 Mg Caps (Pregabalin) .Marland Kitchen.. 1 by mouth three times a day for pain 14)  Gabapentin 100 Mg Caps (Gabapentin) .Marland Kitchen.. 1-2 by mouth at bedtime as needed numbness 15)  Promethazine-codeine 6.25-10 Mg/74ml Syrp (Promethazine-codeine) .... 5 cc by mouth every 4 hours as needed for cough symptoms 16)  Ciprofloxacin Hcl 500 Mg Tabs (Ciprofloxacin hcl) .Marland Kitchen.. 1 by mouth bid  Patient Instructions: 1)  Call if you are not better in a reasonable amount of time or if worse. Go to ER if feeling really bad!  2)  Toviaz 8 mg 1 a day as needed urinary symptoms  Prescriptions: CIPROFLOXACIN HCL 500 MG TABS (CIPROFLOXACIN HCL) 1 by mouth bid  #20 x 1   Entered and Authorized by:   Tresa Garter MD   Signed by:   Tresa Garter MD on  01/18/2010   Method used:   Electronically to        [facility]. [phone]* (retail)       [address]       [place], [place]  [address]       Ph: [phone]       Fax: [phone]   RxID:   [id number]    Orders Added: 1)  EKG w/ Interpretation [93000] 2)  TLB-Udip ONLY [81003-UDIP] 3)  Est. Patient Level IV [87564]

## 2010-03-21 NOTE — Letter (Signed)
Summary: Out of Work  [facility]  [address] [place], [place] [address]   Phone: [phone]  Fax: [phone]    December 05, 2009   Employee:  Terri King    To Whom It May Concern:   For Medical reasons, please excuse the above named employee from work for the following dates:  Start:   12/05/09  End:   12/09/09, return monday 12/11/09  If you need additional information, please feel free to contact our office.         Sincerely,     Newt Lukes MD

## 2010-03-21 NOTE — Assessment & Plan Note (Signed)
Summary: ear ache  stc   Vital Signs:  Patient profile:   76 year old female Height:      63 inches Weight:      181.50 pounds BMI:     32.27 O2 Sat:      99 % on Room air Temp:     98.7 degrees F oral Pulse rate:   73 / minute BP sitting:   150 / 70  (left arm) Cuff size:   large  Vitals Entered By: Lucious Groves (Jul 04, 2009 3:33 PM)  O2 Flow:  Room air CC: C/O ear ache./kb Is Patient Diabetic? No Pain Assessment Patient in pain? yes     Location: ear/head/neck Intensity: 10 Onset of pain  Saturday Comments Patient notes that she woke with the ear ache on Saturday and it is causing ear, head, neck, and shoulder pain./kb   CC:  C/O ear ache./kb.  History of Present Illness: C/o severe lancinating pain in R ear and R face q 20 sec x 2 d. no injury  Current Medications (verified): 1)  Maxzide-25 37.5-25 Mg Tabs (Triamterene-Hctz) .Marland Kitchen.. 1 Once Daily 2)  Norvasc 5 Mg Tabs (Amlodipine Besylate) .Marland Kitchen.. 1 Once Daily 3)  Temazepam 15 Mg Caps (Temazepam) .Marland Kitchen.. 1 To 2 At Bedtime As Needed 4)  Alprazolam 0.25 Mg Tabs (Alprazolam) .Marland Kitchen.. 1 Two Times A Day As Needed For Anxiety 5)  Vitamin D3 1000 Unit  Tabs (Cholecalciferol) .Marland Kitchen.. 1 Qd 6)  Hydrocodone-Acetaminophen 5-325 Mg Tabs (Hydrocodone-Acetaminophen) .Marland Kitchen.. 1 By Mouth Up To 4 Times Per Day As Needed For Pain 7)  Aspirin 81 Mg Tabs (Aspirin) .... Once Daily 8)  Toprol Xl 100 Mg Xr24h-Tab (Metoprolol Succinate) .... Once Daily 9)  Zantac 150 Maximum Strength 150 Mg Tabs (Ranitidine Hcl) .... Once Daily 10)  Klor-Con M20 20 Meq Cr-Tabs (Potassium Chloride Crys Cr) .Marland Kitchen.. 1 Once Daily 11)  Crestor 10 Mg Tabs (Rosuvastatin Calcium) .Marland Kitchen.. 1 By Mouth Once Daily For Cholesterol  Allergies (verified): 1)  ! Iodine 2)  ! 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    Physical Exam  General:  NAD Grimacing wpain at times Head:  Normocephalic and atraumatic without obvious abnormalities. No apparent alopecia or balding. Eyes:  No corneal or conjunctival inflammation noted. EOMI. Perrla. Funduscopic exam benign, without hemorrhages, exudates or papilledema. Vision grossly normal. Ears:  External ear exam shows no significant lesions or deformities.  Otoscopic examination reveals clear canals, tympanic membranes are intact bilaterally without bulging, retraction, inflammation or discharge. Hearing is grossly normal bilaterally. Nose:  External nasal examination shows no deformity or inflammation. Nasal mucosa are pink and moist without lesions or exudates. Mouth:  Oral mucosa and oropharynx without lesions or exudates.  Teeth in good repair. Neck:  No deformities, masses, or tenderness noted. Lungs:  Normal respiratory effort, chest expands symmetrically. Lungs are clear to auscultation, no crackles or wheezes. Heart:  Normal rate and regular rhythm. S1 and S2 normal without gallop, murmur, click, rub or other extra sounds. Abdomen:  Bowel sounds positive,abdomen soft and non-tender without masses, organomegaly or hernias noted. Msk:  No deformity or scoliosis noted of thoracic or lumbar spine.   Neurologic:  No  cranial nerve deficits noted. Station and gait are normal. Plantar reflexes are down-going bilaterally. DTRs are symmetrical throughout. Sensory, motor and coordinative functions appear intact. Skin:  Intact without suspicious lesions or rashes Cervical Nodes:  No lymphadenopathy noted Psych:  Cognition and judgment appear intact. Alert and cooperative with normal attention span and concentration. No apparent delusions, illusions,  hallucinations   Impression & Recommendations:  Problem # 1:  EAR PAIN (ICD-388.70) R due to #2 Assessment New see meds  Problem # 2:  NEURALGIA, TRIGEMINAL (ICD-350.1) R Assessment: New see meds  Problem # 3:  HYPERTENSION (ICD-401.9) Assessment: Deteriorated  Her updated medication list for this problem includes:    Maxzide-25 37.5-25 Mg Tabs (Triamterene-hctz) .Marland Kitchen... 1 once daily    Norvasc 5 Mg Tabs (Amlodipine besylate) .Marland Kitchen... 1 once daily    Toprol Xl 100 Mg Xr24h-tab (Metoprolol succinate) ..... Once daily  BP today: 150/70 Prior BP: 124/54 (05/19/2009)  Labs Reviewed: K+: 3.8 (05/12/2009) Creat: : 0.8 (05/12/2009)   Chol: 290 (05/12/2009)   HDL: 52.80 (05/12/2009)   LDL: 106 (02/07/2009)   TG: 290.0 (05/12/2009)  Complete Medication List: 1)  Maxzide-25 37.5-25 Mg Tabs (Triamterene-hctz) .Marland Kitchen.. 1 once daily 2)  Norvasc 5 Mg Tabs (Amlodipine besylate) .Marland Kitchen.. 1 once daily 3)  Temazepam 15 Mg Caps (Temazepam) .Marland Kitchen.. 1 to 2 at bedtime as needed 4)  Alprazolam 0.25 Mg Tabs (Alprazolam) .Marland Kitchen.. 1 two times a day as needed for anxiety 5)  Vitamin D3 1000 Unit Tabs (Cholecalciferol) .Marland Kitchen.. 1 qd 6)  Hydrocodone-acetaminophen 5-325 Mg Tabs (Hydrocodone-acetaminophen) .Marland Kitchen.. 1 by mouth up to 4 times per day as needed for pain 7)  Aspirin 81 Mg Tabs (Aspirin) .... Once daily 8)  Toprol Xl 100 Mg Xr24h-tab (Metoprolol succinate) .... Once daily 9)  Zantac 150 Maximum Strength 150 Mg Tabs (Ranitidine hcl) .... Once daily 10)  Klor-con M20 20 Meq Cr-tabs (Potassium chloride crys cr) .Marland Kitchen.. 1 once daily 11)  Crestor 10 Mg Tabs (Rosuvastatin calcium) .Marland Kitchen.. 1 by mouth once daily for cholesterol 12)  Prednisone 10 Mg Tabs (Prednisone) .... Take 40mg  qd for 3 days, then 20 mg qd for 3 days, then 10mg  qd for 6 days, then stop. take pc. 13)  Acyclovir 800 Mg Tabs (Acyclovir) .Marland Kitchen.. 1 by mouth 5 times a day 14)  Lyrica 75 Mg Caps (Pregabalin) .Marland Kitchen.. 1 by mouth three times a day for pain  Patient  Instructions: 1)  Call if you are not better in a reasonable amount of time or if worse Prescriptions: LYRICA 75 MG CAPS (PREGABALIN) 1 by mouth three times a day for pain  #90 x 3   Entered and Authorized by:   Tresa Garter MD   Signed by:   Tresa Garter MD on 07/04/2009   Method used:   Print then Give to Patient   RxID:   [id number] ACYCLOVIR 800 MG TABS (ACYCLOVIR) 1 by mouth 5 times a day  #35 x 1   Entered and Authorized by:   Tresa Garter MD   Signed by:   Tresa Garter MD on 07/04/2009   Method used:   Print then Give to Patient   RxID:   [id number] PREDNISONE 10 MG TABS (PREDNISONE) Take 40mg  qd for 3 days, then 20 mg qd for 3 days, then 10mg  qd for 6 days, then stop. Take pc.  #24 x 1   Entered and Authorized by:   Tresa Garter MD   Signed by:   Georgina Quint  Medrith Veillon MD on 07/04/2009   Method used:   Print then Give to Patient   RxID:   [id number]

## 2010-03-21 NOTE — Progress Notes (Signed)
Summary: Rf temazepam  Phone Note Refill Request Message from:  Pharmacy  Refills Requested: Medication #1:  TEMAZEPAM 15 MG CAPS 1 to 2 at bedtime as needed   Dosage confirmed as above?Dosage Confirmed   Supply Requested: 60   Last Refilled: 12/06/2009  Method Requested: Telephone to Pharmacy Next Appointment Scheduled: 02-09-10 Initial call taken by: Lanier Prude, [facility]),  January 02, 2010 10:05 AM  Follow-up for Phone Call        ok 3 ref Follow-up by: Tresa Garter MD,  January 02, 2010 8:59 PM  Additional Follow-up for Phone Call Additional follow up Details #1::        Rx called to pharmacy Additional Follow-up by: Lanier Prude, [facility]),  January 03, 2010 8:23 AM    Prescriptions: TEMAZEPAM 15 MG CAPS (TEMAZEPAM) 1 to 2 at bedtime as needed  #60 x 3   Entered by:   Lanier Prude, [facility])   Authorized by:   Tresa Garter MD   Signed by:   Lanier Prude, CMA(AAMA) on 01/03/2010   Method used:   Telephoned to ...       [facility]. [phone]* (retail)       [address]       [place], [place]  [address]       Ph: [phone]       Fax: [phone]   RxID:   [id number]

## 2010-03-21 NOTE — Assessment & Plan Note (Signed)
Summary: 3 mo rov /nws  #   Vital Signs:  Patient profile:   76 year old female Weight:      187 pounds Temp:     98.6 degrees F oral Pulse rate:   91 / minute BP sitting:   124 / 54  (left arm)  Vitals Entered By: Tora Perches (May 19, 2009 9:36 AM) CC: f/u Is Patient Diabetic? No   CC:  f/u.  History of Present Illness: The patient presents for a follow up of hypertension, diabetes, hyperlipidemia, OA C/o fatigue: she is working all the time - day and night. Arthralgias have improved a lot after she stopped Simvastatin...   Preventive Screening-Counseling & Management  Alcohol-Tobacco     Smoking Status: never  Current Medications (verified): 1)  Maxzide-25 37.5-25 Mg Tabs (Triamterene-Hctz) .Marland Kitchen.. 1 Once Daily 2)  Norvasc 5 Mg Tabs (Amlodipine Besylate) .Marland Kitchen.. 1 Once Daily 3)  Temazepam 15 Mg Caps (Temazepam) .Marland Kitchen.. 1 To 2 At Bedtime As Needed 4)  Alprazolam 0.25 Mg Tabs (Alprazolam) .Marland Kitchen.. 1 Two Times A Day As Needed For Anxiety 5)  Vitamin D3 1000 Unit  Tabs (Cholecalciferol) .Marland Kitchen.. 1 Qd 6)  Hydrocodone-Acetaminophen 5-325 Mg Tabs (Hydrocodone-Acetaminophen) .Marland Kitchen.. 1 By Mouth Up To 4 Times Per Day As Needed For Pain 7)  Aspirin 81 Mg Tabs (Aspirin) .... Once Daily 8)  Toprol Xl 100 Mg Xr24h-Tab (Metoprolol Succinate) .... Once Daily 9)  Zantac 150 Maximum Strength 150 Mg Tabs (Ranitidine Hcl) .... Once Daily 10)  Klor-Con M20 20 Meq Cr-Tabs (Potassium Chloride Crys Cr) .Marland Kitchen.. 1 Once Daily  Allergies: 1)  ! Iodine 2)  ! 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 Nasal mucosa are pink and moist without lesions or exudates. Mouth:  Oral mucosa and oropharynx without lesions or exudates.  Teeth in good repair. Lungs:  CTA Heart:  RRR Abdomen:  S/NT Msk:  No deformity or scoliosis noted of thoracic or lumbar spine.  R knee is  less  tender w/ROM B calves are nl Extremities:  No edema B Neurologic:  No cranial nerve deficits noted. Station and gait are normal. Plantar reflexes are down-going bilaterally. DTRs are symmetrical throughout. Sensory, motor and coordinative functions appear intact. Skin:  Intact without suspicious lesions or rashes Psych:  Cognition and judgment appear intact. Alert and cooperative with normal attention span and concentration. No apparent delusions, illusions, hallucinations   Impression & Recommendations:  Problem # 1:  HYPERTENSION (ICD-401.9) Assessment Improved  Her updated medication list for this problem includes:    Maxzide-25 37.5-25 Mg Tabs (Triamterene-hctz) .Marland Kitchen... 1 once daily    Norvasc 5 Mg Tabs (Amlodipine besylate) .Marland Kitchen... 1 once daily    Toprol Xl 100 Mg Xr24h-tab (Metoprolol succinate) ..... Once daily  Problem # 2:  HYPERLIPIDEMIA (ICD-272.4) Assessment: Deteriorated  Her updated medication list for this problem includes:    Crestor 10 Mg Tabs (Rosuvastatin calcium) .Marland Kitchen... 1 by mouth once daily for cholesterol - start 1/2 M-W-F  Problem # 3:  ARTHRALGIA (ICD-719.40) Assessment: Improved  Problem # 4:  HYPERGLYCEMIA (ICD-790.29) Assessment: Unchanged Wt loss rec.  Problem # 5:  KNEE PAIN (ICD-719.46) Assessment: Improved  Her updated medication list for this problem includes:    Hydrocodone-acetaminophen 5-325 Mg Tabs (Hydrocodone-acetaminophen) .Marland Kitchen... 1 by mouth up to 4 times per day as needed for pain    Aspirin 81 Mg Tabs (Aspirin) ..... Once  daily  Complete Medication List: 1)  Maxzide-25 37.5-25 Mg Tabs (Triamterene-hctz) .Marland Kitchen.. 1 once daily 2)  Norvasc 5 Mg Tabs (Amlodipine besylate) .Marland Kitchen.. 1 once daily 3)  Temazepam 15 Mg Caps (Temazepam) .Marland Kitchen.. 1 to 2 at bedtime as needed 4)  Alprazolam 0.25 Mg Tabs (Alprazolam) .Marland Kitchen.. 1 two times a day as needed for anxiety 5)  Vitamin D3 1000 Unit Tabs (Cholecalciferol) .Marland Kitchen.. 1 qd 6)  Hydrocodone-acetaminophen 5-325 Mg Tabs (Hydrocodone-acetaminophen) .Marland Kitchen.. 1 by mouth up to 4 times per day as needed for pain 7)  Aspirin 81 Mg Tabs (Aspirin) .... Once daily 8)  Toprol Xl 100 Mg Xr24h-tab (Metoprolol succinate) .... Once daily 9)  Zantac 150 Maximum Strength 150 Mg Tabs (Ranitidine hcl) .... Once daily 10)  Klor-con M20 20 Meq Cr-tabs (Potassium chloride crys cr) .Marland Kitchen.. 1 once daily 11)  Crestor 10 Mg Tabs (Rosuvastatin calcium) .Marland Kitchen.. 1 by mouth once daily for cholesterol  Patient Instructions: 1)  Cut back on work hours 2)  Take a power nap 3)  Please schedule a follow-up appointment in 4 months. 4)  BMP prior to visit, ICD-9: 5)  Hepatic Panel prior to visit, ICD-9: 6)  Lipid Panel prior to visit, ICD-9:272.0 790.29  995.20 7)  HbgA1C prior to visit, ICD-9: Prescriptions: CRESTOR 10 MG TABS (ROSUVASTATIN CALCIUM) 1 by mouth once daily for cholesterol  #30 x 12   Entered and Authorized by:   Tresa Garter MD   Signed by:   Tresa Garter MD on 05/19/2009   Method used:   Print then Give to Patient   RxID:   [id number]

## 2010-03-21 NOTE — Progress Notes (Signed)
Summary: Lyrica  Phone Note From Pharmacy   Caller: [facility]. #[id number]* Summary of Call: Insurance will not cover Lyrica, they want pt to try gabapentin. Please advise.  Initial call taken by: Lamar Sprinkles, CMA,  September 13, 2009 3:49 PM  Follow-up for Phone Call        ok GABA Follow-up by: Tresa Garter MD,  September 13, 2009 6:01 PM  Additional Follow-up for Phone Call Additional follow up Details #1::        Pt informed  Additional Follow-up by: Lamar Sprinkles, CMA,  September 13, 2009 6:13 PM    New/Updated Medications: GABAPENTIN 100 MG CAPS (GABAPENTIN) 1-2 by mouth at bedtime as needed numbness Prescriptions: GABAPENTIN 100 MG CAPS (GABAPENTIN) 1-2 by mouth at bedtime as needed numbness  #60 x 6   Entered by:   Lamar Sprinkles, CMA   Authorized by:   Tresa Garter MD   Signed by:   Lamar Sprinkles, CMA on 09/13/2009   Method used:   Electronically to        [facility]. [phone]* (retail)       [address]       [place], [place]  [address]       Ph: [phone]       Fax: [phone]   RxID:   [id number] GABAPENTIN 100 MG CAPS (GABAPENTIN) 1-2 by mouth at bedtime as needed numbness  #60 x 6   Entered and Authorized by:   Tresa Garter MD   Signed by:   Tresa Garter MD on 09/13/2009   Method used:   Print then Give to Patient   RxID:   [id number]

## 2010-03-23 NOTE — Miscellaneous (Signed)
Summary: mammogram 2011   Clinical Lists Changes  Observations: Added new observation of MAMMOGRAM: normal (02/14/2010 16:16)      Preventive Care Screening  Mammogram:    Date:  02/14/2010    Results:  normal

## 2010-03-23 NOTE — Assessment & Plan Note (Signed)
Summary: YEARLY PER PT D/T-U[employer] AND MEDICARE---STC   Vital Signs:  Patient profile:   76 year old female Height:      63 inches Weight:      176 pounds BMI:     31.29 Temp:     98.5 degrees F oral Pulse rate:   76 / minute Pulse rhythm:   regular Resp:     16 per minute BP sitting:   132 / 80  (left arm) Cuff size:   regular  Vitals Entered By: Lanier Prude, Beverly Gust) (February 09, 2010 8:44 AM) CC: CPX Is Patient Diabetic? No   Primary Care Provider:  Tresa Garter MD  CC:  CPX.  History of Present Illness: The patient presents for a preventive health examination    Current Medications (verified): 1)  Maxzide-25 37.5-25 Mg Tabs (Triamterene-Hctz) .Marland Kitchen.. 1 Once Daily 2)  Norvasc 5 Mg Tabs (Amlodipine Besylate) .Marland Kitchen.. 1 Once Daily 3)  Temazepam 15 Mg Caps (Temazepam) .Marland Kitchen.. 1 To 2 At Bedtime As Needed 4)  Alprazolam 0.25 Mg Tabs (Alprazolam) .Marland Kitchen.. 1 Two Times A Day As Needed For Anxiety 5)  Vitamin D3 1000 Unit  Tabs (Cholecalciferol) .Marland Kitchen.. 1 Qd 6)  Hydrocodone-Acetaminophen 5-325 Mg Tabs (Hydrocodone-Acetaminophen) .Marland Kitchen.. 1 By Mouth Up To 4 Times Per Day As Needed For Pain 7)  Aspirin 81 Mg Tabs (Aspirin) .... Once Daily 8)  Toprol Xl 100 Mg Xr24h-Tab (Metoprolol Succinate) .... Once Daily 9)  Zantac 150 Maximum Strength 150 Mg Tabs (Ranitidine Hcl) .... Once Daily 10)  Klor-Con M20 20 Meq Cr-Tabs (Potassium Chloride Crys Cr) .Marland Kitchen.. 1 Once Daily 11)  Crestor 10 Mg Tabs (Rosuvastatin Calcium) .Marland Kitchen.. 1 By Mouth Once Daily For Cholesterol 12)  Prednisone 10 Mg Tabs (Prednisone) .... Take 40mg  Qd For 3 Days, Then 20 Mg Qd For 3 Days, Then 10mg  Qd For 6 Days, Then Stop. Take Pc. 13)  Lyrica 75 Mg Caps (Pregabalin) .Marland Kitchen.. 1 By Mouth Three Times A Day For Pain 14)  Gabapentin 100 Mg Caps (Gabapentin) .Marland Kitchen.. 1-2 By Mouth At Bedtime As Needed Numbness 15)  Promethazine-Codeine 6.25-10 Mg/73ml Syrp (Promethazine-Codeine) .... 5 Cc By Mouth Every 4 Hours As Needed For Cough Symptoms  Allergies  (verified): 1)  ! Iodine 2)  ! 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[employer], industrial/product) Never Smoked Alcohol use-no  Past Medical History: H/o pneumonia 2008   Hyperlipidemia Hypertension  Osteoarthritis R knee injury 2009 Coronary artery disease/MI   Dr Tenny Craw  Past Surgical History: tonsillectomy Partial Hysterectomy L knee arthroscopy Colonoscopy 2009  Family History: Family History Hypertension F glaucoma 1 S died in her sleep  Review of Systems  The patient denies anorexia, fever, weight loss, weight gain, vision loss, decreased hearing, hoarseness, chest pain, syncope, dyspnea on exertion, peripheral edema, prolonged cough, headaches, hemoptysis, abdominal pain, melena, hematochezia, severe indigestion/heartburn, hematuria, incontinence, genital sores, muscle weakness, suspicious skin lesions, transient blindness, difficulty walking, depression, unusual weight change, abnormal bleeding, enlarged lymph nodes, angioedema, and breast masses.    Physical Exam  General:  alert, well-developed, well-nourished, and cooperative to examination.   nontoxic but fatigued Head:  Normocephalic and atraumatic without obvious abnormalities. No apparent alopecia or balding. Eyes:  No corneal or conjunctival inflammation noted. EOMI. Perrla.  Ears:  External ear exam shows no significant lesions or deformities.  Otoscopic examination reveals clear canals, tympanic membranes are intact bilaterally without bulging, retraction,  inflammation or discharge. Hearing is grossly normal bilaterally. Nose:  External nasal examination shows no deformity or inflammation. Nasal mucosa are pink and moist without lesions or exudates. Mouth:  Oral mucosa and oropharynx without lesions or exudates.  Teeth in good repair. Neck:  No deformities, masses, or tenderness noted. Lungs:  normal  respiratory effort, no intercostal retractions or use of accessory muscles; normal breath sounds bilaterally - no crackles and no wheezes.    Heart:  normal rate, regular rhythm, no murmur, and no rub. BLE without edema.  Abdomen:  soft, non-tender, normal bowel sounds, no distention; no masses and no appreciable hepatomegaly or splenomegaly.   Msk:  No deformity or scoliosis noted of thoracic or lumbar spine. Pulses:  R and L carotid,radial,femoral,dorsalis pedis and posterior tibial pulses are full and equal bilaterally Extremities:  No clubbing, cyanosis, edema, or deformity noted with normal full range of motion of all joints.   Neurologic:  No cranial nerve deficits noted. Station and gait are normal. Plantar reflexes are down-going bilaterally. DTRs are symmetrical throughout. Sensory, motor and coordinative functions appear intact. Skin:  Intact without suspicious lesions or rashes Cervical Nodes:  No lymphadenopathy noted Inguinal Nodes:  No significant adenopathy Psych:  Cognition and judgment appear intact. Alert and cooperative with normal attention span and concentration. No apparent delusions, illusions, hallucinations   Impression & Recommendations:  Problem # 1:  HEALTH MAINTENANCE EXAM (ICD-V70.0) Assessment New Mammo is pending 12/27 Colon up to date The labs were reviewed with the patient.  Health and age related issues were discussed. Available screening tests and vaccinations were discussed as well. Healthy life style including good diet and exercise was discussed.   Problem # 2:  CORONARY ARTERY DISEASE (ICD-414.00) Assessment: Unchanged F/u w/Dr Tenny Craw Her updated medication list for this problem includes:    Maxzide-25 37.5-25 Mg Tabs (Triamterene-hctz) .Marland Kitchen... 1 once daily    Norvasc 5 Mg Tabs (Amlodipine besylate) .Marland Kitchen... 1 once daily    Aspirin 81 Mg Tabs (Aspirin) ..... Once daily    Toprol Xl 100 Mg Xr24h-tab (Metoprolol succinate) ..... Once daily  Problem # 3:   PURE HYPERCHOLESTEROLEMIA (ICD-272.0) Assessment: Deteriorated  Her updated medication list for this problem includes:    Crestor 10 Mg Tabs (Rosuvastatin calcium) .Marland Kitchen... 1 by mouth once daily for cholesterol    Lipitor 10 Mg Tabs (Atorvastatin calcium) .Marland Kitchen... 1 by mouth once daily for cholesterol  Problem # 4:  NEURALGIA, TRIGEMINAL (ICD-350.1) Assessment: Improved Lyrica prn  Problem # 5:  HYPERTENSION (ICD-401.9) Assessment: Improved  Her updated medication list for this problem includes:    Maxzide-25 37.5-25 Mg Tabs (Triamterene-hctz) .Marland Kitchen... 1 once daily    Norvasc 5 Mg Tabs (Amlodipine besylate) .Marland Kitchen... 1 once daily    Toprol Xl 100 Mg Xr24h-tab (Metoprolol succinate) ..... Once daily  BP today: 132/80 Prior BP: 148/70 (01/18/2010)  Labs Reviewed: K+: 3.9 (01/31/2010) Creat: : 0.9 (01/31/2010)   Chol: 284 (01/31/2010)   HDL: 56.20 (01/31/2010)   LDL: 101 (09/02/2009)   TG: 174.0 (01/31/2010)  Problem # 6:  KNEE PAIN (ZOX-096.04) L>>R Assessment: Improved  Her updated medication list for this problem includes:    Hydrocodone-acetaminophen 5-325 Mg Tabs (Hydrocodone-acetaminophen) .Marland Kitchen... 1 by mouth up to 4 times per day as needed for pain    Aspirin 81 Mg Tabs (Aspirin) ..... Once daily  Complete Medication List: 1)  Maxzide-25 37.5-25 Mg Tabs (Triamterene-hctz) .Marland Kitchen.. 1 once daily 2)  Norvasc 5 Mg Tabs (Amlodipine besylate) .Marland Kitchen.. 1 once daily 3)  Temazepam 15 Mg Caps (Temazepam) .Marland Kitchen.. 1 to 2 at bedtime as needed 4)  Alprazolam 0.25 Mg Tabs (Alprazolam) .Marland Kitchen.. 1 two times a day as needed for anxiety 5)  Vitamin D3 1000 Unit Tabs (Cholecalciferol) .Marland Kitchen.. 1 qd 6)  Hydrocodone-acetaminophen 5-325 Mg Tabs (Hydrocodone-acetaminophen) .Marland Kitchen.. 1 by mouth up to 4 times per day as needed for pain 7)  Aspirin 81 Mg Tabs (Aspirin) .... Once daily 8)  Toprol Xl 100 Mg Xr24h-tab (Metoprolol succinate) .... Once daily 9)  Zantac 150 Maximum Strength 150 Mg Tabs (Ranitidine hcl) .... Once daily 10)   Klor-con M20 20 Meq Cr-tabs (Potassium chloride crys cr) .Marland Kitchen.. 1 once daily 11)  Crestor 10 Mg Tabs (Rosuvastatin calcium) .Marland Kitchen.. 1 by mouth once daily for cholesterol 12)  Prednisone 10 Mg Tabs (Prednisone) .... Take 40mg  qd for 3 days, then 20 mg qd for 3 days, then 10mg  qd for 6 days, then stop. take pc. 13)  Lyrica 75 Mg Caps (Pregabalin) .Marland Kitchen.. 1 by mouth three times a day for pain 14)  Lipitor 10 Mg Tabs (Atorvastatin calcium) .Marland Kitchen.. 1 by mouth once daily for cholesterol  Other Orders: TD Toxoids IM 7 YR + (04540) Admin 1st Vaccine (98119)  Patient Instructions: 1)  Please schedule a follow-up appointment in 4 months. 2)  BMP prior to visit, ICD-9: 3)  Hepatic Panel prior to visit, ICD-9: 4)  Lipid Panel prior to visit, ICD-9:272.20 790.29 5)  HbgA1C prior to visit, ICD-9: Prescriptions: LYRICA 75 MG CAPS (PREGABALIN) 1 by mouth three times a day for pain  #90 x 3   Entered and Authorized by:   Tresa Garter MD   Signed by:   Tresa Garter MD on 02/09/2010   Method used:   Print then Give to Patient   RxID:   [id number] HYDROCODONE-ACETAMINOPHEN 5-325 MG TABS (HYDROCODONE-ACETAMINOPHEN) 1 by mouth up to 4 times per day as needed for pain  #90 x 1   Entered and Authorized by:   Tresa Garter MD   Signed by:   Tresa Garter MD on 02/09/2010   Method used:   Print then Give to Patient   RxID:   [id number] LIPITOR 10 MG TABS (ATORVASTATIN CALCIUM) 1 by mouth once daily for cholesterol  #30 x 12   Entered and Authorized by:   Tresa Garter MD   Signed by:   Tresa Garter MD on 02/09/2010   Method used:   Electronically to        [facility]. [phone]* (retail)       [address]       [place], [place]  [address]       Ph: [phone]       Fax: [phone]   RxID:   [id number]    Orders Added: 1)  Est. Patient age 56&> [01093] 2)  TD Toxoids IM 7 YR + [90714] 3)  Admin 1st Vaccine  [90471]   Immunizations Administered:  Tetanus Vaccine:    Vaccine Type: Td    Site: right deltoid    Mfr: Sanofi Pasteur    Dose: 0.5 ml    Route: IM    Given by: Lanier Prude, CMA(AAMA)    Exp. Date: 03/23/2011    Lot #: [id number]    VIS given: 01/07/08 version given February 09, 2010.   Contraindications/Deferment of Procedures/Staging:    Test/Procedure: FLU VAX    Reason for deferment: patient  declined   Immunizations Administered:  Tetanus Vaccine:    Vaccine Type: Td    Site: right deltoid    Mfr: Sanofi Pasteur    Dose: 0.5 ml    Route: IM    Given by: Lanier Prude, CMA(AAMA)    Exp. Date: 03/23/2011    Lot #: [id number]    VIS given: 01/07/08 version given February 09, 2010.

## 2010-03-23 NOTE — Progress Notes (Signed)
Summary: med change  Phone Note From Pharmacy Call back at [phone]   Caller: [facility]. #[phone]* Summary of Call: Per Abby at [facility], there are requesting a med change. Insurance will not pay for Lyrica that was rx instead they want rx changed to Gabapentin Initial call taken by: Brenton Grills CMA Duncan Dull),  February 09, 2010 10:44 AM  Follow-up for Phone Call        She tried Gabapentin and had nausea on it Please PA Lyrica Thank you!  Follow-up by: Tresa Garter MD,  February 09, 2010 1:18 PM  Additional Follow-up for Phone Call Additional follow up Details #1::        Pharmacy states that insurance refuses to pay for Lyrica unless pt goes through a mail order service and per pharmacy pt doesnt want to order rx through a mail order service.  Additional Follow-up by: Brenton Grills CMA Duncan Dull),  February 09, 2010 2:13 PM    Additional Follow-up for Phone Call Additional follow up Details #2::    left vm for pt req name of mail order pharm & advised that was only way ins will pay................Marland KitchenLamar Sprinkles, CMA  February 09, 2010 2:20 PM   Spoke w/pt, no samples avail at this time. She does not use mail order. Pt will call and confirm this is how to get med and then call our office w/any questions.  Follow-up by: Lamar Sprinkles, CMA,  February 09, 2010 5:03 PM

## 2010-05-01 ENCOUNTER — Telehealth: Payer: Self-pay | Admitting: Internal Medicine

## 2010-05-09 NOTE — Progress Notes (Signed)
Summary: refill request    Caller: [facility]. #[phone]* Reason for Call: Needs renewal Details for Reason: Temazepam 15mg  Summary of Call: Pharmacy request refill for Pt. via fax 04/30/10. Initial call taken by: Burnard Leigh [facility]),  May 01, 2010 10:02 AM    Additional Follow-up for Phone Call Additional follow up Details #2::    ok 3 ref Follow-up by: Tresa Garter MD,  May 01, 2010 6:03 PM  Prescriptions: TEMAZEPAM 15 MG CAPS (TEMAZEPAM) 1 to 2 at bedtime as needed  #60 x 3   Entered by:   Lanier Prude, [facility])   Authorized by:   Tresa Garter MD   Signed by:   Lanier Prude, CMA(AAMA) on 05/02/2010   Method used:   Telephoned to ...       [facility]. [phone]* (retail)       [address]       [place], [place]  [address]       Ph: [phone]       Fax: [phone]   RxID:   [id number]

## 2010-07-03 ENCOUNTER — Other Ambulatory Visit: Payer: Self-pay | Admitting: Internal Medicine

## 2010-07-04 ENCOUNTER — Other Ambulatory Visit (INDEPENDENT_AMBULATORY_CARE_PROVIDER_SITE_OTHER): Payer: Managed Care, Other (non HMO)

## 2010-07-04 ENCOUNTER — Other Ambulatory Visit (INDEPENDENT_AMBULATORY_CARE_PROVIDER_SITE_OTHER): Payer: Self-pay | Admitting: Internal Medicine

## 2010-07-04 DIAGNOSIS — Z13220 Encounter for screening for lipoid disorders: Secondary | ICD-10-CM

## 2010-07-04 DIAGNOSIS — R7309 Other abnormal glucose: Secondary | ICD-10-CM

## 2010-07-04 DIAGNOSIS — E7800 Pure hypercholesterolemia, unspecified: Secondary | ICD-10-CM

## 2010-07-04 LAB — BASIC METABOLIC PANEL
Calcium: 9.9 mg/dL (ref 8.4–10.5)
Creatinine, Ser: 0.9 mg/dL (ref 0.4–1.2)
GFR: 66.7 mL/min (ref >60.00)
Sodium: 142 mEq/L (ref 135–145)

## 2010-07-04 LAB — HEPATIC FUNCTION PANEL
Alkaline Phosphatase: 81 U/L (ref 39–117)
Bilirubin, Direct: 0.1 mg/dL (ref 0.0–0.3)
Total Bilirubin: 0.8 mg/dL (ref 0.3–1.2)
Total Protein: 7 g/dL (ref 6.0–8.3)

## 2010-07-04 LAB — LIPID PANEL
HDL: 77 mg/dL (ref >39.00)
Total CHOL/HDL Ratio: 3
Triglycerides: 98 mg/dL (ref 0.0–149.0)
VLDL: 19.6 mg/dL (ref 0.0–40.0)

## 2010-07-04 LAB — HEMOGLOBIN A1C: Hgb A1c MFr Bld: 5.9 % (ref 4.6–6.5)

## 2010-07-04 NOTE — Assessment & Plan Note (Signed)
[facility]                            CARDIOLOGY OFFICE NOTE   Terri, King                      MRN:          [id number]  DATE:04/23/2008                            DOB:          1934/03/15    IDENTIFICATION:  The patient is a 76 year old woman who I have seen in  the past. old woman who I have seen in  the past.  Her last clinic visit in cardiology was back in 2008.  She  has a history of hypertension, dyslipidemia.  She did have a normal  cardiac catheterization in 1986.   I have got a note from Dr. Leim Fabry office that she was being  evaluated for knee arthroscopy.  The patient was called to make an  appointment if she was going to undergo a preop risk stratification.   On talking to the patient, she injured her knee when she tripped over an  opened door at work back in December.  She is using a cane for this now  and has some limitation.  She has had arthroscopy on the left.   On talking to her, she denies chest pain, breathing is okay as noted, no  decline in her activity from a breathing standpoint.   CURRENT MEDICATIONS:  1. Maxzide 37.5/25  2. Norvasc 5.  3. Vitamin D.  4. Simvastatin 20.  5. Aspirin 81.  6. Toprol-XL 100.  7. Zantac.   PHYSICAL EXAMINATION:  GENERAL:  The patient is in no distress at rest.  VITAL SIGNS:  Blood pressure 147/69, pulse 75, weight 184 down 10 pounds  from 2008.  NECK:  JVP is normal.  No audible bruits.  LUNGS:  Clear.  No rales or wheezes.  CARDIAC:  Regular rate and rhythm.  S1, S2.  No S3, no significant  murmurs.  ABDOMEN:  Supple.  No hepatomegaly.  EXTREMITIES:  Good pulses.  No edema.   12-lead EKG shows normal sinus rhythm 70 beats per minute.   IMPRESSION:  1. Terri King is a 76 year old woman with no known history of      coronary artery disease. woman with no known history of      coronary artery disease.  She does have a history of hypertension,      dyslipidemia.  On evaluation today, she denies chest pain.      Overall, I think her activity is adequate to say she  would be at      low-risk if she has plans to undergo any surgical procedure as      planned.  I do not think any further testing is indicated.  2. Hypertension, fair control.  Again followed by Dr. Posey Rea in      Internal Medicine.  We will not make any changes.  3. Dyslipidemia, on Zocor.  Total cholesterol of 169, LDL of 88 in the      past, HDL 65, this was in May 2008.  Would continue again followup      by Dr. Posey Rea.   I have not set a definite followup for the patient.  I will be happy to  see her if her symptoms change.  I encouraged her to try to stay as  active as she can.  Pricilla Riffle, MD, [facility]  Electronically Signed    PVR/MedQ  DD: 04/25/2008  DT: 04/26/2008  Job #: [id number]

## 2010-07-04 NOTE — Assessment & Plan Note (Signed)
[facility]                            CARDIOLOGY OFFICE NOTE   Terri, King                      MRN:          [id number]  DATE:11/22/2006                            DOB:          05/22/1934    IDENTIFICATION:  Terri King is a 76 year old woman with a history of  hypertension and dyslipidemia.  I saw her last in February.   When I saw her, she seemed to be doing fairly well.  No chest pain.  No  shortness of breath.  Over the past 8 months though she has noted  increased shortness of breath, fatigability, she has slowed down.  She  actually has had some chest tightness walking hills at work.  She  actually took a nitro after 1 of the spells.  She said it was a very hot  day in the summer, and attributed it to that, but she is slowing down.   She was seen by Macarthur Critchley [name] and set up for a Myoview scan.  She  had this done November 14, 2006.  She walked only 4-1/2 minutes to peak  heart rate of 131, which is 87% maximal.  She had some chest pain that  eased off in recovery.  EKG had some changes, though she has had these  before on a remote Myoview.  Myoview scan showed normal perfusion  though.  Normal function.   CURRENT MEDICATIONS:  Include:  1. Temazepam daily.  2. Amlodipine 5 daily.  3. Zantac.  4. Toprol XL 50 daily.  5. K-Dur 10 mEq.  6. Simvastatin 20 (had been taken off Vytorin because of high CK).  7. Baby aspirin daily.   PHYSICAL EXAMINATION:  Patient is in no distress.  Blood pressure is 150/80.  Pulse is 70.  Weight 194.  LUNGS:  Good airflow.  No wheezes.  CARDIAC EXAM:  Regular rate and rhythm.  S1 and S2.  No S3.  No murmurs.  ABDOMEN:  Benign.  No right upper quadrant tenderness.  EXTREMITIES:  No edema.   IMPRESSION:  1. Chest pain.  Patient had a normal catheterization back in 1986.      She did not walk very long on the current test, and though she had      normal perfusion, it is concerning about the  spell on the hill      which she took a nitroglycerin for.  I have asked her now with the      heat down to go ahead and try doing some things and see if she can      reproduce any of the symptoms again.  If she can, I would recommend      a cardiac catheterization (right, left) to define pressures      anatomy.  I will call her back to see how she is doing.  2. Hypertension.  I did increase her amlodipine to 10 daily.  Continue      other medications.  3. Dyslipidemia.  She will need to have a fasting lipid checked.  I      will check a  CBC and a CK today to confirm resolution.   Tentatively, I will put followup for a few months, but again, I will be  in touch with her about her progress.     Pricilla Riffle, MD, [facility]  Electronically Signed    PVR/MedQ  DD: 11/22/2006  DT: 11/23/2006  Job #: [id number]

## 2010-07-05 ENCOUNTER — Other Ambulatory Visit: Payer: Self-pay | Admitting: Internal Medicine

## 2010-07-05 DIAGNOSIS — E7800 Pure hypercholesterolemia, unspecified: Secondary | ICD-10-CM

## 2010-07-05 DIAGNOSIS — R7309 Other abnormal glucose: Secondary | ICD-10-CM

## 2010-07-07 NOTE — Assessment & Plan Note (Signed)
[facility]                            CARDIOLOGY OFFICE NOTE   AHSLEY, ATTWOOD                      MRN:          [id number]  DATE:04/02/2006                            DOB:          11-02-1934    IDENTIFICATION:  The patient is a 76 year old woman, history of  hypertension, dyslipidemia.  I saw her back in June.   She comes in today without complaint.  No chest pain, no shortness of  breath.   CURRENT MEDICATIONS:  1. Triamterine/hydrochlorothiazide 37.5/25 mg.  2. Vytorin 10/20 mg.  3. Temazepam 15 mg.  4. Amlodipine 5 mg.  5. Zantac daily.  6. Potassium chloride 20 mEq daily.  7. Potassium 50 mg daily.   PHYSICAL EXAMINATION:  GENERAL:  The patient is in no distress.  VITAL SIGNS:  Blood pressure 154/76 on the left, 152/78 on my check.  Pulse is 78 and regular.  Weight not taken.  NECK:  JVP is normal.  Mild bruit.  LUNGS:  Clear.  CARDIAC:  A grade 1-2/6 systolic murmur heard best at the left sternal  border.  ABDOMEN:  Benign.  EXTREMITIES:  No edema.   IMPRESSION:  1. Hypertension.  The patient says she has been under increased stress      the past week.  Still, she will need follow-up.  Continue on the      medications.  She will follow up with Dr. Posey Rea in about a      month.  If high, her regimen should go up probably with increased      amlodipine.  I will follow up in October.  2. Carotid disease.  Mild vascular disease, stable from 2005.  This      last study was done in November 2007.  Continue on statin.  3. Vytorin.  Will need to follow up fasting lipids.  4. Again, follow up as scheduled in November.  I encouraged the      patient to stay active.     Pricilla Riffle, MD, [facility]  Electronically Signed    PVR/MedQ  DD: 04/01/2006  DT: 04/02/2006  Job #: [id number]

## 2010-07-11 ENCOUNTER — Encounter: Payer: Self-pay | Admitting: Internal Medicine

## 2010-07-12 ENCOUNTER — Ambulatory Visit (INDEPENDENT_AMBULATORY_CARE_PROVIDER_SITE_OTHER)
Admission: RE | Admit: 2010-07-12 | Discharge: 2010-07-12 | Disposition: A | Discharge status: Home / Self Care | Payer: Managed Care, Other (non HMO) | Source: Ambulatory Visit | Attending: Internal Medicine | Admitting: Internal Medicine

## 2010-07-12 ENCOUNTER — Other Ambulatory Visit: Payer: Managed Care, Other (non HMO)

## 2010-07-12 ENCOUNTER — Other Ambulatory Visit (INDEPENDENT_AMBULATORY_CARE_PROVIDER_SITE_OTHER): Payer: Managed Care, Other (non HMO) | Admitting: Internal Medicine

## 2010-07-12 ENCOUNTER — Ambulatory Visit (INDEPENDENT_AMBULATORY_CARE_PROVIDER_SITE_OTHER): Payer: Managed Care, Other (non HMO) | Admitting: Internal Medicine

## 2010-07-12 DIAGNOSIS — R05 Cough: Secondary | ICD-10-CM

## 2010-07-12 DIAGNOSIS — R202 Paresthesia of skin: Secondary | ICD-10-CM

## 2010-07-12 DIAGNOSIS — M2550 Pain in unspecified joint: Secondary | ICD-10-CM

## 2010-07-12 DIAGNOSIS — Z0000 Encounter for general adult medical examination without abnormal findings: Secondary | ICD-10-CM

## 2010-07-12 DIAGNOSIS — R5383 Other fatigue: Secondary | ICD-10-CM

## 2010-07-12 DIAGNOSIS — I2510 Atherosclerotic heart disease of native coronary artery without angina pectoris: Secondary | ICD-10-CM

## 2010-07-12 DIAGNOSIS — I10 Essential (primary) hypertension: Secondary | ICD-10-CM

## 2010-07-12 DIAGNOSIS — R209 Unspecified disturbances of skin sensation: Secondary | ICD-10-CM

## 2010-07-12 DIAGNOSIS — M1990 Unspecified osteoarthritis, unspecified site: Secondary | ICD-10-CM

## 2010-07-12 DIAGNOSIS — E785 Hyperlipidemia, unspecified: Secondary | ICD-10-CM

## 2010-07-12 LAB — URINALYSIS, ROUTINE W REFLEX MICROSCOPIC
Bilirubin Urine: NEGATIVE
Ketones, ur: NEGATIVE
Urobilinogen, UA: 0.2 (ref 0.0–1.0)

## 2010-07-12 MED ORDER — HYDROCODONE-ACETAMINOPHEN 5-325 MG PO TABS: 1.0000 | ORAL_TABLET | Freq: Four times a day (QID) | ORAL | Status: DC | PRN

## 2010-07-12 MED ORDER — MOXIFLOXACIN HCL 400 MG PO TABS: 400.0000 mg | ORAL_TABLET | Freq: Every day | ORAL | Status: AC

## 2010-07-12 MED ORDER — PREDNISONE 10 MG PO TABS: 10.0000 mg | ORAL_TABLET | ORAL | Status: DC

## 2010-07-12 MED ORDER — METOPROLOL SUCCINATE ER 50 MG PO TB24: 50.0000 mg | ORAL_TABLET | Freq: Every day | ORAL | Status: DC

## 2010-07-12 NOTE — Progress Notes (Signed)
  Subjective:    Patient ID: Terri King, female    DOB: [birth year], 76 y.o.   MRN: [id number]  HPI  C/o fatigue, sweats and am cough  X 1 mo  Review of Systems  Constitutional: Positive for fatigue. Negative for chills.  HENT: Negative for mouth sores and tinnitus.   Respiratory: Negative for cough.   Musculoskeletal: Positive for arthralgias and gait problem.  Psychiatric/Behavioral: Negative for behavioral problems and agitation. The patient is nervous/anxious.    Wt Readings from Last 3 Encounters:  07/12/10 173 lb (78.472 kg)  02/09/10 176 lb (79.833 kg)  01/18/10 177 lb (80.287 kg)   BP Readings from Last 3 Encounters:  07/12/10 108/60  02/09/10 132/80  01/18/10 148/70        Objective:   Physical Exam  Constitutional: She appears well-developed and well-nourished. No distress.  HENT:  Head: Normocephalic.  Right Ear: External ear normal.  Left Ear: External ear normal.  Nose: Nose normal.  Mouth/Throat: Oropharynx is clear and moist.  Eyes: Conjunctivae are normal. Pupils are equal, round, and reactive to light. Right eye exhibits no discharge. Left eye exhibits no discharge.  Neck: Normal range of motion. Neck supple. No JVD present. No tracheal deviation present. No thyromegaly present.  Cardiovascular: Normal rate, regular rhythm and normal heart sounds.   Pulmonary/Chest: No stridor. No respiratory distress. She has no wheezes.  Abdominal: Soft. Bowel sounds are normal. She exhibits no distension and no mass. There is no tenderness. There is no rebound and no guarding.  Musculoskeletal: She exhibits tenderness (B knees are tender). She exhibits no edema.  Lymphadenopathy:    She has no cervical adenopathy.  Neurological: She displays normal reflexes. No cranial nerve deficit. She exhibits normal muscle tone. Coordination normal.  Skin: No rash noted. No erythema.  Psychiatric: She has a normal mood and affect. Her behavior is normal. Judgment and thought  content normal.          Assessment & Plan:   OSTEOARTHRITIS On rx  HYPERTENSION BP Readings from Last 3 Encounters:  07/12/10 108/60  02/09/10 132/80  01/18/10 148/70  On Rx   HYPERLIPIDEMIA On Rx  CORONARY ARTERY DISEASE No sxs    See Orders

## 2010-07-12 NOTE — Patient Instructions (Signed)
Stop Lyrica Take Metoprolol 50 mg at night Call if worse

## 2010-07-13 ENCOUNTER — Telehealth: Payer: Self-pay | Admitting: Internal Medicine

## 2010-07-13 NOTE — Telephone Encounter (Signed)
Patient informed. 

## 2010-07-13 NOTE — Telephone Encounter (Signed)
Pt had xray & lab results yesterday, pt requests results today

## 2010-07-13 NOTE — Telephone Encounter (Signed)
Yes, except for Avelox (unless she has a lot of sinus congestion and colored drainage: then take Avelox too) Thx

## 2010-07-13 NOTE — Telephone Encounter (Signed)
Pt called again req results. She wants to know if she should fill med given?

## 2010-07-17 ENCOUNTER — Encounter: Payer: Self-pay | Admitting: Internal Medicine

## 2010-07-17 NOTE — Assessment & Plan Note (Signed)
On Rx 

## 2010-07-17 NOTE — Assessment & Plan Note (Signed)
On rx 

## 2010-07-17 NOTE — Assessment & Plan Note (Signed)
BP Readings from Last 3 Encounters:  07/12/10 108/60  02/09/10 132/80  01/18/10 148/70  On Rx

## 2010-07-17 NOTE — Assessment & Plan Note (Signed)
No sx's 

## 2010-07-30 ENCOUNTER — Other Ambulatory Visit: Payer: Self-pay | Admitting: Internal Medicine

## 2010-09-03 ENCOUNTER — Other Ambulatory Visit: Payer: Self-pay | Admitting: Internal Medicine

## 2010-09-04 ENCOUNTER — Telehealth: Payer: Self-pay | Admitting: *Deleted

## 2010-09-04 NOTE — Telephone Encounter (Signed)
OK to fill this Temazepam prescription with additional refills x5 Thank you!

## 2010-09-04 NOTE — Telephone Encounter (Signed)
   [name] - 9:35 AM More Detail >>      Rx Auth Request      Surescripts In Interface        Sent: Sun September 03, 2010  9:35 AM    To: Lysbeth Galas [name] Rx Refill          Message     The demographic information from the pharmacy is: Patient Name: Terri King, Terri King Patient DOB: Mar 23, 1934 Patient Gender: Female Address:    [address]    [facility]    [id number]      Terri King  76 y.o. / Female ([birth year])  Pharmacy: [facility] - [place], [place] - [address] Ph: [phone]   MRN: [id number]  PCP: Sonda Primes, MD  Wt: 173 lb (78.472 kg) (07/12/2010)   Home: [phone]  Work: [phone]  Mobile: [phone]       Requested Medications     temazepam (RESTORIL) 15 MG capsule [Pharmacy Med Name: TEMAZEPAM 15 MG CAPSULE]    take 1 to 2 capsules by mouth at bedtime if needed    Disp: 60 capsule R: 3  DAW Start: 09/03/2010 Class: Normal    Originally ordered on: 04/14/2010 Last refill: 07/31/2010 Order History       Contacts         Type Contact Phone    09/03/2010  9:35 AM Interface (Incoming) [facility] [phone]      Allergies as of 09/03/2010   Date Reviewed: 07/12/2010        Noted Type Reactions    Gabapentin 02/09/2010         REACTION: nausea         Ibuprofen 04/23/2008              Iodine 03/17/2007              Simvastatin 02/17/2009         REACTION: achy           Patient Flags     No FYI flags for this patient      Future Appointments   Date & Time Provider Department Center    09/15/2010 9:00 AM Sonda Primes, MD [facility]               Pharmacy     [facility] [address] - [place], [place] - [address]    [address] [place] [address]    Phone: [phone] Fax: [phone]

## 2010-09-05 MED ORDER — TEMAZEPAM 15 MG PO CAPS: 15.0000 mg | ORAL_CAPSULE | Freq: Every evening | ORAL | Status: DC | PRN

## 2010-09-11 ENCOUNTER — Other Ambulatory Visit (INDEPENDENT_AMBULATORY_CARE_PROVIDER_SITE_OTHER): Payer: Managed Care, Other (non HMO)

## 2010-09-11 DIAGNOSIS — R202 Paresthesia of skin: Secondary | ICD-10-CM

## 2010-09-11 DIAGNOSIS — M2550 Pain in unspecified joint: Secondary | ICD-10-CM

## 2010-09-11 DIAGNOSIS — R5383 Other fatigue: Secondary | ICD-10-CM

## 2010-09-11 DIAGNOSIS — R209 Unspecified disturbances of skin sensation: Secondary | ICD-10-CM

## 2010-09-11 DIAGNOSIS — R5381 Other malaise: Secondary | ICD-10-CM

## 2010-09-11 LAB — CBC WITH DIFFERENTIAL/PLATELET
Basophils Relative: 0.4 % (ref 0.0–3.0)
Eosinophils Absolute: 0.2 10*3/uL (ref 0.0–0.7)
Eosinophils Relative: 1.5 % (ref 0.0–5.0)
HCT: 42.6 % (ref 36.0–46.0)
Hemoglobin: 14.6 g/dL (ref 12.0–15.0)
Lymphs Abs: 2 10*3/uL (ref 0.7–4.0)
MCHC: 34.3 g/dL (ref 30.0–36.0)
MCV: 94.1 fl (ref 78.0–100.0)
Monocytes Absolute: 1.1 10*3/uL — ABNORMAL HIGH (ref 0.1–1.0)
Neutro Abs: 6.9 10*3/uL (ref 1.4–7.7)
RBC: 4.53 Mil/uL (ref 3.87–5.11)

## 2010-09-11 LAB — COMPREHENSIVE METABOLIC PANEL
AST: 26 U/L (ref 0–37)
Albumin: 4.2 g/dL (ref 3.5–5.2)
Alkaline Phosphatase: 76 U/L (ref 39–117)
BUN: 10 mg/dL (ref 6–23)
Creatinine, Ser: 0.8 mg/dL (ref 0.4–1.2)
Potassium: 3.6 mEq/L (ref 3.5–5.1)
Total Bilirubin: 0.7 mg/dL (ref 0.3–1.2)

## 2010-09-11 LAB — TSH: TSH: 1.52 u[IU]/mL (ref 0.35–5.50)

## 2010-09-11 LAB — URIC ACID: Uric Acid, Serum: 5.3 mg/dL (ref 2.4–7.0)

## 2010-09-11 LAB — SEDIMENTATION RATE: Sed Rate: 20 mm/hr (ref 0–22)

## 2010-09-12 LAB — VITAMIN D 25 HYDROXY (VIT D DEFICIENCY, FRACTURES): Vit D, 25-Hydroxy: 54 ng/mL (ref 30–89)

## 2010-09-15 ENCOUNTER — Ambulatory Visit (INDEPENDENT_AMBULATORY_CARE_PROVIDER_SITE_OTHER): Payer: Managed Care, Other (non HMO) | Admitting: Internal Medicine

## 2010-09-15 ENCOUNTER — Encounter: Payer: Self-pay | Admitting: Internal Medicine

## 2010-09-15 DIAGNOSIS — R509 Fever, unspecified: Secondary | ICD-10-CM

## 2010-09-15 DIAGNOSIS — I10 Essential (primary) hypertension: Secondary | ICD-10-CM

## 2010-09-15 DIAGNOSIS — M25569 Pain in unspecified knee: Secondary | ICD-10-CM

## 2010-09-15 DIAGNOSIS — R079 Chest pain, unspecified: Secondary | ICD-10-CM

## 2010-09-15 DIAGNOSIS — G500 Trigeminal neuralgia: Secondary | ICD-10-CM

## 2010-09-15 DIAGNOSIS — E785 Hyperlipidemia, unspecified: Secondary | ICD-10-CM

## 2010-09-15 DIAGNOSIS — H9209 Otalgia, unspecified ear: Secondary | ICD-10-CM

## 2010-09-15 MED ORDER — NITROGLYCERIN 0.4 MG SL SUBL: 0.4000 mg | SUBLINGUAL_TABLET | SUBLINGUAL | Status: DC | PRN

## 2010-09-15 MED ORDER — GABAPENTIN 100 MG PO CAPS: 100.0000 mg | ORAL_CAPSULE | Freq: Three times a day (TID) | ORAL | Status: DC | PRN

## 2010-09-15 NOTE — Assessment & Plan Note (Signed)
See Meds 

## 2010-09-15 NOTE — Assessment & Plan Note (Signed)
On Rx 

## 2010-09-15 NOTE — Assessment & Plan Note (Signed)
Will watch 

## 2010-09-15 NOTE — Assessment & Plan Note (Addendum)
Card cons Dr Tenny Craw NTG To ER if CP reccures

## 2010-09-15 NOTE — Assessment & Plan Note (Signed)
Better  

## 2010-09-15 NOTE — Progress Notes (Signed)
  Subjective:    Patient ID: Terri King, female    DOB: [birth year], 76 y.o.   MRN: [id number]  HPI  F/u knee OA, elev chol C/o episode of CP while she was cleaning her house, then it migrated to shoulders and back, heart fluttered, she was sweaty (45 min) - she went to bed - it was gone next am.  Review of Systems  Constitutional: Negative for chills, activity change, appetite change, fatigue and unexpected weight change.  HENT: Negative for congestion, mouth sores and sinus pressure.   Eyes: Negative for visual disturbance.  Respiratory: Negative for cough, chest tightness, wheezing and stridor.   Cardiovascular: Negative for chest pain, palpitations and leg swelling.  Gastrointestinal: Negative for nausea and abdominal pain.  Genitourinary: Negative for frequency, difficulty urinating and vaginal pain.  Musculoskeletal: Positive for joint swelling and gait problem. Negative for back pain.  Skin: Negative for pallor and rash.  Neurological: Negative for dizziness, tremors, weakness, numbness and headaches.  Psychiatric/Behavioral: Negative for confusion and sleep disturbance.       Objective:   Physical Exam  Constitutional: She appears well-developed and well-nourished. No distress.  HENT:  Head: Normocephalic.  Right Ear: External ear normal.  Left Ear: External ear normal.  Nose: Nose normal.  Mouth/Throat: Oropharynx is clear and moist.  Eyes: Conjunctivae are normal. Pupils are equal, round, and reactive to light. Right eye exhibits no discharge. Left eye exhibits no discharge.  Neck: Normal range of motion. Neck supple. No JVD present. No tracheal deviation present. No thyromegaly present.  Cardiovascular: Normal rate, regular rhythm and normal heart sounds.   Pulmonary/Chest: No stridor. No respiratory distress. She has no wheezes.  Abdominal: Soft. Bowel sounds are normal. She exhibits no distension and no mass. There is no tenderness. There is no rebound and no  guarding.  Musculoskeletal: She exhibits no edema and no tenderness.  Lymphadenopathy:    She has no cervical adenopathy.  Neurological: She displays normal reflexes. No cranial nerve deficit. She exhibits normal muscle tone. Coordination normal.  Skin: No rash noted. No erythema.  Psychiatric: She has a normal mood and affect. Her behavior is normal. Judgment and thought content normal.      Lab Results  Component Value Date   WBC 10.1 09/11/2010   HGB 14.6 09/11/2010   HCT 42.6 09/11/2010   PLT 272.0 09/11/2010   CHOL 229* 07/04/2010   TRIG 98.0 07/04/2010   HDL 77.00 07/04/2010   LDLDIRECT 133.6 07/04/2010   ALT 19 09/11/2010   AST 26 09/11/2010   NA 141 09/11/2010   K 3.6 09/11/2010   CL 104 09/11/2010   CREATININE 0.8 09/11/2010   BUN 10 09/11/2010   CO2 29 09/11/2010   TSH 1.52 09/11/2010   HGBA1C 5.9 07/04/2010       Assessment & Plan:   A complex case

## 2010-09-15 NOTE — Assessment & Plan Note (Signed)
Try Neurontin

## 2010-09-15 NOTE — Patient Instructions (Signed)
Wt Readings from Last 3 Encounters:  09/15/10 173 lb (78.472 kg)  07/12/10 173 lb (78.472 kg)  02/09/10 176 lb (79.833 kg)

## 2010-10-02 ENCOUNTER — Institutional Professional Consult (permissible substitution): Payer: Managed Care, Other (non HMO) | Admitting: Internal Medicine

## 2010-11-02 ENCOUNTER — Institutional Professional Consult (permissible substitution): Payer: Managed Care, Other (non HMO) | Admitting: Internal Medicine

## 2010-12-05 ENCOUNTER — Other Ambulatory Visit: Payer: Self-pay | Admitting: *Deleted

## 2010-12-05 ENCOUNTER — Telehealth: Payer: Self-pay | Admitting: *Deleted

## 2010-12-05 NOTE — Telephone Encounter (Signed)
Called patient: she has aches, myalgias and a rash under both breasts. She is concerned for shingles recurrence. Advised that recurrence is 5% risk, that shingles would not be bilateral. Offered to see her as a walk in tomorrow. Rx request denied.

## 2010-12-05 NOTE — Telephone Encounter (Signed)
Rec Rf req for Acyclovir 800 mg 1 po five times day. Med is not active in Epic or centricity but was prescribed in 06/2009. Ok to Rf?

## 2010-12-08 ENCOUNTER — Ambulatory Visit (INDEPENDENT_AMBULATORY_CARE_PROVIDER_SITE_OTHER): Payer: Managed Care, Other (non HMO) | Admitting: Internal Medicine

## 2010-12-08 ENCOUNTER — Encounter: Payer: Self-pay | Admitting: Internal Medicine

## 2010-12-08 DIAGNOSIS — R079 Chest pain, unspecified: Secondary | ICD-10-CM

## 2010-12-08 DIAGNOSIS — R609 Edema, unspecified: Secondary | ICD-10-CM

## 2010-12-08 DIAGNOSIS — I10 Essential (primary) hypertension: Secondary | ICD-10-CM

## 2010-12-08 DIAGNOSIS — R0602 Shortness of breath: Secondary | ICD-10-CM

## 2010-12-08 LAB — BASIC METABOLIC PANEL
Chloride: 102 mEq/L (ref 96–112)
Potassium: 3.5 mEq/L (ref 3.5–5.3)

## 2010-12-08 NOTE — Patient Instructions (Signed)
Your physician has requested that you have an echocardiogram. Echocardiography is a painless test that uses sound waves to create images of your heart. It provides your doctor with information about the size and shape of your heart and how well your heart's chambers and valves are working. This procedure takes approximately one hour. There are no restrictions for this procedure.  Please have lab work today.  Continue all medications as listed.  Follow up will be based on the results of your testing.

## 2010-12-10 NOTE — Assessment & Plan Note (Signed)
Tr LE edema.  Will check labs.  I am not convinced of signif volume overload.

## 2010-12-10 NOTE — Assessment & Plan Note (Signed)
BP is higher than it should be  Will need to be followed.

## 2010-12-10 NOTE — Assessment & Plan Note (Signed)
I am not convinced the patients chest pain is cardiac.  Occurs at any time. With her dyspnea I would recomm an echo  I will also check a bmet and BNP.

## 2010-12-10 NOTE — Progress Notes (Signed)
HPI Patient is a 76 year old who I last saw in 2010.  She has a hsitory of HTN and dyslpidemia.    She was seen by A [name]  And referred for CP and SOB.  On taking to the patient she says that had chest pains with and without activity.  She does say that she gets more SOB with activity. Does wake up with sweats.  NO chest pain with this.  Allergies  Allergen Reactions  . Gabapentin     REACTION: nausea  . Ibuprofen   . Iodine   . Simvastatin     REACTION: achy    Current Outpatient Prescriptions  Medication Sig Dispense Refill  . ALPRAZolam (XANAX) 0.25 MG tablet Take 0.25 mg by mouth 2 (two) times daily as needed. For anxiety       . amLODipine (NORVASC) 5 MG tablet TAKE 1 TABLET BY MOUTH ONCE DAILY  30 tablet  5  . aspirin 81 MG EC tablet Take 81 mg by mouth daily.        Marland Kitchen atorvastatin (LIPITOR) 10 MG tablet Take 10 mg by mouth daily. For cholesterol       . Cholecalciferol 1000 UNITS tablet Take 1,000 Units by mouth daily.        Marland Kitchen HYDROcodone-acetaminophen (NORCO) 5-325 MG per tablet Take 1 tablet by mouth 4 (four) times daily as needed. For pain  100 tablet  2  . metoprolol (TOPROL XL) 50 MG 24 hr tablet Take 1 tablet (50 mg total) by mouth daily. Take at hs  30 tablet  11  . nitroGLYCERIN (NITROSTAT) 0.4 MG SL tablet Place 1 tablet (0.4 mg total) under the tongue every 5 (five) minutes as needed for chest pain.  20 tablet  2  . potassium chloride SA (K-DUR,KLOR-CON) 20 MEQ tablet Take 20 mEq by mouth daily.        . ranitidine (ZANTAC) 150 MG capsule Take 150 mg by mouth daily.        . rosuvastatin (CRESTOR) 10 MG tablet Take 10 mg by mouth daily. For cholesterol       . temazepam (RESTORIL) 15 MG capsule Take 1-2 capsules (15-30 mg total) by mouth at bedtime as needed.  60 capsule  5  . triamterene-hydrochlorothiazide (MAXZIDE-25) 37.5-25 MG per tablet take 1 tablet by mouth once daily  30 tablet  5    Past Medical History  Diagnosis Date  . History of pneumonia 2008    . Hyperlipemia   . HTN (hypertension)   . Osteoarthritis   . CAD (coronary artery disease)     DR Tenny Craw  . MI (myocardial infarction)     DR Tenny Craw    Past Surgical History  Procedure Date  . Tonsillectomy   . Partial hysterectomy   . Knee arthroscopy     left    Family History  Problem Relation Age of Onset  . Hypertension    . Glaucoma Father   . Hypertension Mother     History   Social History  . Marital Status: Widowed    Spouse Name: N/A    Number of Children: N/A  . Years of Education: N/A   Occupational History  . 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 Smoking status: Never Smoker   . Smokeless tobacco: Not on file  . Alcohol Use: No  . Drug Use: No  . Sexually Active: No   Other Topics Concern  .  Not on file   Social History Narrative  . No narrative on file    Review of Systems:  All systems reviewed.  They are negative to the above problem except as previously stated.  Vital Signs: BP 155/75  Pulse 92  Ht 5\' 3"  (1.6 m)  Wt 179 lb (81.194 kg)  BMI 31.71 kg/m2  Physical Exam  Patient is in NAD  HEENT:  Normocephalic, atraumatic. EOMI, PERRLA.  Neck: JVP is normal. No thyromegaly. No bruits.  Lungs: clear to auscultation. No rales no wheezes.  Heart: Regular rate and rhythm. Normal S1, S2. No S3.   No significant murmurs. PMI not displaced.  Abdomen:  Supple, nontender. Normal bowel sounds. No masses. No hepatomegaly.  Extremities:   Good distal pulses throughout. Tr lower extremity edema.  Musculoskeletal :moving all extremities.  Neuro:   alert and oriented x3.  CN II-XII grossly intact.  EKG  87 bpm.    Assessment and Plan:

## 2010-12-11 ENCOUNTER — Telehealth: Payer: Self-pay | Admitting: *Deleted

## 2010-12-11 MED ORDER — ACYCLOVIR 800 MG PO TABS: 800.0000 mg | ORAL_TABLET | Freq: Every day | ORAL | Status: AC

## 2010-12-11 NOTE — Telephone Encounter (Signed)
OK to fill this prescription with additional refills x1 Thank you!  

## 2010-12-11 NOTE — Telephone Encounter (Signed)
Pharm faxed RF request -  Acyclovir 800 mg 1 po 5 x's day # 35. Last RX was #35 with 1 Rf.  OK FOR RF?

## 2010-12-11 NOTE — Telephone Encounter (Signed)
DONE

## 2010-12-14 ENCOUNTER — Ambulatory Visit (HOSPITAL_COMMUNITY): Payer: Managed Care, Other (non HMO) | Attending: Internal Medicine | Admitting: Radiology

## 2010-12-14 DIAGNOSIS — R072 Precordial pain: Secondary | ICD-10-CM

## 2010-12-14 DIAGNOSIS — I379 Nonrheumatic pulmonary valve disorder, unspecified: Secondary | ICD-10-CM | POA: Insufficient documentation

## 2010-12-14 DIAGNOSIS — M7989 Other specified soft tissue disorders: Secondary | ICD-10-CM | POA: Insufficient documentation

## 2010-12-14 DIAGNOSIS — R0609 Other forms of dyspnea: Secondary | ICD-10-CM | POA: Insufficient documentation

## 2010-12-14 DIAGNOSIS — R0989 Other specified symptoms and signs involving the circulatory and respiratory systems: Secondary | ICD-10-CM | POA: Insufficient documentation

## 2010-12-14 DIAGNOSIS — I10 Essential (primary) hypertension: Secondary | ICD-10-CM | POA: Insufficient documentation

## 2010-12-14 DIAGNOSIS — I059 Rheumatic mitral valve disease, unspecified: Secondary | ICD-10-CM | POA: Insufficient documentation

## 2010-12-14 DIAGNOSIS — R0602 Shortness of breath: Secondary | ICD-10-CM

## 2010-12-14 DIAGNOSIS — I079 Rheumatic tricuspid valve disease, unspecified: Secondary | ICD-10-CM | POA: Insufficient documentation

## 2010-12-19 ENCOUNTER — Telehealth: Payer: Self-pay | Admitting: Internal Medicine

## 2010-12-19 MED ORDER — DEXLANSOPRAZOLE 60 MG PO CPDR: 60.0000 mg | DELAYED_RELEASE_CAPSULE | Freq: Every day | ORAL | Status: DC

## 2010-12-19 NOTE — Telephone Encounter (Signed)
Rx sent electronically to [facility] [address] per pt request.  Pt was notified that it had been done.

## 2010-12-19 NOTE — Telephone Encounter (Signed)
Lab results given to pt.

## 2010-12-19 NOTE — Telephone Encounter (Signed)
Terri King states Dr Tenny Craw gave her some samples of Dexilant, she is requesting an rx be called to [facility].  Is this ok and what dose and strength?

## 2010-12-19 NOTE — Telephone Encounter (Signed)
Pt had test last thurs and would like results, pt was on xanax but was switched to something that starts with a  d, uses [facility] [address], pls call before 10a if poss

## 2010-12-19 NOTE — Telephone Encounter (Signed)
OK to call in Rx.  One pill daily (60 mg)

## 2010-12-22 ENCOUNTER — Other Ambulatory Visit: Payer: Self-pay | Admitting: *Deleted

## 2010-12-25 ENCOUNTER — Encounter: Payer: Self-pay | Admitting: Internal Medicine

## 2010-12-25 ENCOUNTER — Ambulatory Visit (INDEPENDENT_AMBULATORY_CARE_PROVIDER_SITE_OTHER): Payer: Managed Care, Other (non HMO) | Admitting: Internal Medicine

## 2010-12-25 ENCOUNTER — Telehealth: Payer: Self-pay | Admitting: *Deleted

## 2010-12-25 ENCOUNTER — Encounter: Payer: Self-pay | Admitting: Gastroenterology

## 2010-12-25 VITALS — BP 120/62 | HR 84 | Temp 98.9°F | Resp 16 | Wt 177.0 lb

## 2010-12-25 DIAGNOSIS — M1990 Unspecified osteoarthritis, unspecified site: Secondary | ICD-10-CM

## 2010-12-25 DIAGNOSIS — K219 Gastro-esophageal reflux disease without esophagitis: Secondary | ICD-10-CM

## 2010-12-25 DIAGNOSIS — R1310 Dysphagia, unspecified: Secondary | ICD-10-CM | POA: Insufficient documentation

## 2010-12-25 DIAGNOSIS — R079 Chest pain, unspecified: Secondary | ICD-10-CM

## 2010-12-25 DIAGNOSIS — I10 Essential (primary) hypertension: Secondary | ICD-10-CM

## 2010-12-25 MED ORDER — DEXLANSOPRAZOLE 60 MG PO CPDR: 60.0000 mg | DELAYED_RELEASE_CAPSULE | Freq: Every day | ORAL | Status: DC

## 2010-12-25 NOTE — Assessment & Plan Note (Signed)
2012 - new w/dysphagia Better w/Dexilant Wt Readings from Last 3 Encounters:  12/25/10 177 lb (80.287 kg)  12/08/10 179 lb (81.194 kg)  09/15/10 173 lb (78.472 kg)  GI consult

## 2010-12-25 NOTE — Assessment & Plan Note (Signed)
CL is pending 

## 2010-12-25 NOTE — Progress Notes (Signed)
  Subjective:    Patient ID: Terri King, female    DOB: [birth year], 76 y.o.   MRN: [id number]  HPI  The patient presents for a follow-up of  chronic hypertension, chronic dyslipidemia, CP controlled with medicines. ECHO was ok. C/o GERD sx's    Review of Systems  Constitutional: Negative.  Negative for fever, chills, diaphoresis, activity change, appetite change, fatigue and unexpected weight change.  HENT: Negative for hearing loss, ear pain, nosebleeds, congestion, sore throat, facial swelling, rhinorrhea, sneezing, mouth sores, trouble swallowing, neck pain, neck stiffness, postnasal drip, sinus pressure and tinnitus.   Eyes: Negative for pain, discharge, redness, itching and visual disturbance.  Respiratory: Positive for shortness of breath. Negative for cough, chest tightness, wheezing and stridor.   Cardiovascular: Negative for chest pain, palpitations and leg swelling.  Gastrointestinal: Negative for nausea, diarrhea, constipation, blood in stool, abdominal distention, anal bleeding and rectal pain.  Genitourinary: Negative for dysuria, urgency, frequency, hematuria, flank pain, vaginal bleeding, vaginal discharge, difficulty urinating, genital sores and pelvic pain.  Musculoskeletal: Positive for back pain, arthralgias and gait problem. Negative for joint swelling.  Skin: Negative.  Negative for rash.  Neurological: Negative for dizziness, tremors, seizures, syncope, speech difficulty, weakness, numbness and headaches.  Hematological: Negative for adenopathy. Does not bruise/bleed easily.  Psychiatric/Behavioral: Negative for suicidal ideas, behavioral problems, sleep disturbance, dysphoric mood and decreased concentration. The patient is not nervous/anxious.        Objective:   Physical Exam  Constitutional: She appears well-developed and well-nourished. No distress.  HENT:  Head: Normocephalic.  Right Ear: External ear normal.  Left Ear: External ear normal.  Nose: Nose  normal.  Mouth/Throat: Oropharynx is clear and moist.  Eyes: Conjunctivae are normal. Pupils are equal, round, and reactive to light. Right eye exhibits no discharge. Left eye exhibits no discharge.  Neck: Normal range of motion. Neck supple. No JVD present. No tracheal deviation present. No thyromegaly present.  Cardiovascular: Normal rate, regular rhythm and normal heart sounds.   Pulmonary/Chest: No stridor. No respiratory distress. She has no wheezes.  Abdominal: Soft. Bowel sounds are normal. She exhibits no distension and no mass. There is no tenderness. There is no rebound and no guarding.  Musculoskeletal: She exhibits tenderness (Knees B pain). She exhibits no edema.  Lymphadenopathy:    She has no cervical adenopathy.  Neurological: She displays normal reflexes. No cranial nerve deficit. She exhibits normal muscle tone. Coordination normal.  Skin: No rash noted. No erythema.  Psychiatric: She has a normal mood and affect. Her behavior is normal. Judgment and thought content normal.    Study Conclusions ECHO 2012:  - Left ventricle: The cavity size was normal. Wall thickness was normal. Systolic function was normal. The estimated ejection fraction was in the range of 55% to 60%. - Mitral valve: Calcified annulus. Mildly thickened leaflets . - Left atrium: The atrium was mildly dilated. - Atrial septum: No defect or patent foramen ovale was identified.        Assessment & Plan:

## 2010-12-25 NOTE — Assessment & Plan Note (Signed)
Continue with current prescription therapy as reflected on the Med list.  

## 2010-12-25 NOTE — Assessment & Plan Note (Signed)
GI cons dr Russella Dar

## 2010-12-25 NOTE — Telephone Encounter (Signed)
Called [employer] for approval of Dexilant 60mg  ID [id number] at [phone]. This medication was approved indefinitely. Patient called and is aware.

## 2010-12-25 NOTE — Assessment & Plan Note (Signed)
Study Conclusions ECHO 2012:  - Left ventricle: The cavity size was normal. Wall thickness was normal. Systolic function was normal. The estimated ejection fraction was in the range of 55% to 60%. - Mitral valve: Calcified annulus. Mildly thickened leaflets . - Left atrium: The atrium was mildly dilated. - Atrial septum: No defect or patent foramen ovale was Identified.  Continue with current prescription therapy as reflected on the Med list.

## 2010-12-28 ENCOUNTER — Other Ambulatory Visit: Payer: Self-pay | Admitting: *Deleted

## 2010-12-28 MED ORDER — TRIAMTERENE-HCTZ 37.5-25 MG PO TABS: 1.0000 | ORAL_TABLET | Freq: Every day | ORAL | Status: DC

## 2011-01-03 ENCOUNTER — Encounter (HOSPITAL_COMMUNITY): Payer: Managed Care, Other (non HMO) | Admitting: Radiology

## 2011-01-16 ENCOUNTER — Ambulatory Visit: Payer: Managed Care, Other (non HMO) | Admitting: Gastroenterology

## 2011-01-23 ENCOUNTER — Other Ambulatory Visit: Payer: Self-pay | Admitting: *Deleted

## 2011-01-23 MED ORDER — AMLODIPINE BESYLATE 5 MG PO TABS: 5.0000 mg | ORAL_TABLET | Freq: Every day | ORAL | Status: DC

## 2011-02-21 ENCOUNTER — Other Ambulatory Visit: Payer: Self-pay | Admitting: *Deleted

## 2011-02-21 ENCOUNTER — Telehealth: Payer: Self-pay | Admitting: *Deleted

## 2011-02-21 MED ORDER — ATORVASTATIN CALCIUM 10 MG PO TABS: 10.0000 mg | ORAL_TABLET | Freq: Every day | ORAL | Status: DC

## 2011-02-21 NOTE — Telephone Encounter (Signed)
OK to fill this prescription with additional refills x5 Thank you!  

## 2011-02-21 NOTE — Telephone Encounter (Signed)
Rf req for Temazepam 15 mg 1-2 po qhs. Ok to Rf?

## 2011-02-22 ENCOUNTER — Telehealth: Payer: Self-pay | Admitting: *Deleted

## 2011-02-22 MED ORDER — TEMAZEPAM 15 MG PO CAPS: 15.0000 mg | ORAL_CAPSULE | Freq: Every evening | ORAL | Status: DC | PRN

## 2011-02-22 NOTE — Telephone Encounter (Signed)
Rf req for Hydroco/APAP 5-325 1 po qid prn pain # 120. Ok to Rf?

## 2011-02-22 NOTE — Telephone Encounter (Signed)
OK to fill this prescription with additional refills x2 Thank you!  

## 2011-02-23 MED ORDER — HYDROCODONE-ACETAMINOPHEN 5-325 MG PO TABS: 1.0000 | ORAL_TABLET | Freq: Four times a day (QID) | ORAL | Status: DC | PRN

## 2011-02-26 ENCOUNTER — Other Ambulatory Visit: Payer: Self-pay

## 2011-02-26 MED ORDER — DEXLANSOPRAZOLE 60 MG PO CPDR: 60.0000 mg | DELAYED_RELEASE_CAPSULE | Freq: Every day | ORAL | Status: DC

## 2011-03-21 ENCOUNTER — Other Ambulatory Visit: Payer: Self-pay | Admitting: *Deleted

## 2011-03-21 MED ORDER — POTASSIUM CHLORIDE CRYS ER 20 MEQ PO TBCR: 20.0000 meq | EXTENDED_RELEASE_TABLET | Freq: Every day | ORAL | Status: DC

## 2011-03-27 ENCOUNTER — Ambulatory Visit: Payer: Managed Care, Other (non HMO) | Admitting: Internal Medicine

## 2011-03-28 ENCOUNTER — Encounter: Payer: Self-pay | Admitting: Internal Medicine

## 2011-03-28 ENCOUNTER — Other Ambulatory Visit (INDEPENDENT_AMBULATORY_CARE_PROVIDER_SITE_OTHER): Payer: Managed Care, Other (non HMO)

## 2011-03-28 ENCOUNTER — Ambulatory Visit (INDEPENDENT_AMBULATORY_CARE_PROVIDER_SITE_OTHER): Payer: Managed Care, Other (non HMO) | Admitting: Internal Medicine

## 2011-03-28 DIAGNOSIS — F329 Major depressive disorder, single episode, unspecified: Secondary | ICD-10-CM

## 2011-03-28 DIAGNOSIS — R209 Unspecified disturbances of skin sensation: Secondary | ICD-10-CM

## 2011-03-28 DIAGNOSIS — R202 Paresthesia of skin: Secondary | ICD-10-CM

## 2011-03-28 DIAGNOSIS — E785 Hyperlipidemia, unspecified: Secondary | ICD-10-CM

## 2011-03-28 DIAGNOSIS — E7800 Pure hypercholesterolemia, unspecified: Secondary | ICD-10-CM

## 2011-03-28 DIAGNOSIS — E876 Hypokalemia: Secondary | ICD-10-CM

## 2011-03-28 DIAGNOSIS — R7309 Other abnormal glucose: Secondary | ICD-10-CM

## 2011-03-28 DIAGNOSIS — F4323 Adjustment disorder with mixed anxiety and depressed mood: Secondary | ICD-10-CM | POA: Insufficient documentation

## 2011-03-28 DIAGNOSIS — M1990 Unspecified osteoarthritis, unspecified site: Secondary | ICD-10-CM

## 2011-03-28 LAB — CBC WITH DIFFERENTIAL/PLATELET
Basophils Absolute: 0 10*3/uL (ref 0.0–0.1)
Basophils Relative: 0.2 % (ref 0.0–3.0)
HCT: 38.9 % (ref 36.0–46.0)
Hemoglobin: 13.6 g/dL (ref 12.0–15.0)
Lymphs Abs: 1.7 10*3/uL (ref 0.7–4.0)
MCHC: 35.1 g/dL (ref 30.0–36.0)
Monocytes Relative: 12.1 % — ABNORMAL HIGH (ref 3.0–12.0)
Neutro Abs: 2.7 10*3/uL (ref 1.4–7.7)
RDW: 13.5 % (ref 11.5–14.6)

## 2011-03-28 LAB — BASIC METABOLIC PANEL
CO2: 29 mEq/L (ref 19–32)
GFR: 81.62 mL/min (ref >60.00)
Glucose, Bld: 102 mg/dL — ABNORMAL HIGH (ref 70–99)
Potassium: 3.2 mEq/L — ABNORMAL LOW (ref 3.5–5.1)
Sodium: 141 mEq/L (ref 135–145)

## 2011-03-28 MED ORDER — ESCITALOPRAM OXALATE 10 MG PO TABS: 10.0000 mg | ORAL_TABLET | Freq: Every day | ORAL | Status: DC

## 2011-03-28 MED ORDER — ALPRAZOLAM 0.25 MG PO TABS: 0.2500 mg | ORAL_TABLET | Freq: Two times a day (BID) | ORAL | Status: DC | PRN

## 2011-03-28 NOTE — Assessment & Plan Note (Signed)
Labs

## 2011-03-28 NOTE — Assessment & Plan Note (Signed)
Chronic Myalgias from the statins Labs Hold then restart Cresor 2/wk CK

## 2011-03-28 NOTE — Assessment & Plan Note (Signed)
Start rx

## 2011-03-28 NOTE — Assessment & Plan Note (Signed)
Continue with current prescription therapy as reflected on the Med list.  

## 2011-03-28 NOTE — Progress Notes (Signed)
  Subjective:    Patient ID: Terri King, female    DOB: [birth year], 77 y.o.   MRN: [id number]  HPI  The patient presents for a follow-up of  chronic hypertension, chronic dyslipidemia, OA controlled with medicines C/o depression, anxiety - worse    Review of Systems  Constitutional: Negative for chills, activity change, appetite change, fatigue and unexpected weight change.  HENT: Negative for congestion, mouth sores and sinus pressure.   Eyes: Negative for visual disturbance.  Respiratory: Negative for cough and chest tightness.   Gastrointestinal: Negative for nausea and abdominal pain.  Genitourinary: Negative for frequency, difficulty urinating and vaginal pain.  Musculoskeletal: Positive for myalgias, back pain and arthralgias. Negative for gait problem.  Skin: Negative for pallor, rash and wound.  Neurological: Negative for dizziness, tremors, weakness, numbness and headaches.  Psychiatric/Behavioral: Positive for sleep disturbance. Negative for suicidal ideas, behavioral problems, confusion and decreased concentration. The patient is nervous/anxious.        Objective:   Physical Exam  Constitutional: She appears well-developed. No distress.  HENT:  Head: Normocephalic.  Right Ear: External ear normal.  Left Ear: External ear normal.  Nose: Nose normal.  Mouth/Throat: Oropharynx is clear and moist.  Eyes: Conjunctivae are normal. Pupils are equal, round, and reactive to light. Right eye exhibits no discharge. Left eye exhibits no discharge.  Neck: Normal range of motion. Neck supple. No JVD present. No tracheal deviation present. No thyromegaly present.  Cardiovascular: Normal rate, regular rhythm and normal heart sounds.   Pulmonary/Chest: No stridor. No respiratory distress. She has no wheezes.  Abdominal: Soft. Bowel sounds are normal. She exhibits no distension and no mass. There is no tenderness. There is no rebound and no guarding.  Musculoskeletal: She exhibits  tenderness (L knee is in a brace). She exhibits no edema.  Lymphadenopathy:    She has no cervical adenopathy.  Neurological: She displays normal reflexes. No cranial nerve deficit. She exhibits normal muscle tone. Coordination normal.  Skin: No rash noted. No erythema.  Psychiatric: She has a normal mood and affect. Her behavior is normal. Judgment and thought content normal.       Not suicidal or homicidal          Assessment & Plan:

## 2011-03-29 ENCOUNTER — Telehealth: Payer: Self-pay | Admitting: Internal Medicine

## 2011-03-29 LAB — HEPATIC FUNCTION PANEL
ALT: 17 U/L (ref 0–35)
Albumin: 3.9 g/dL (ref 3.5–5.2)
Alkaline Phosphatase: 101 U/L (ref 39–117)
Total Protein: 7 g/dL (ref 6.0–8.3)

## 2011-03-29 NOTE — Telephone Encounter (Signed)
Terri King, please, inform patient that all labs are normal except for low potassium. Restart KCl if not taking Thx

## 2011-03-30 NOTE — Telephone Encounter (Signed)
Left detailed mess informing pt of below.  

## 2011-04-23 ENCOUNTER — Telehealth: Payer: Self-pay | Admitting: Internal Medicine

## 2011-04-23 NOTE — Telephone Encounter (Signed)
Called home phone and left message that I did not call her on Friday to set up a test.

## 2011-04-23 NOTE — Telephone Encounter (Signed)
FU Call: Pt returning call from our office from Friday regarding pt needing to schedule a test/procedure. Please return pt call to discuss further.

## 2011-06-11 ENCOUNTER — Ambulatory Visit: Payer: Managed Care, Other (non HMO) | Admitting: Internal Medicine

## 2011-06-19 ENCOUNTER — Other Ambulatory Visit: Payer: Self-pay | Admitting: Internal Medicine

## 2011-06-20 ENCOUNTER — Other Ambulatory Visit: Payer: Self-pay | Admitting: *Deleted

## 2011-06-20 MED ORDER — METOPROLOL SUCCINATE ER 50 MG PO TB24: 50.0000 mg | ORAL_TABLET | Freq: Every day | ORAL | Status: DC

## 2011-06-21 ENCOUNTER — Ambulatory Visit: Payer: Managed Care, Other (non HMO) | Admitting: Internal Medicine

## 2011-07-09 ENCOUNTER — Telehealth: Payer: Self-pay | Admitting: Internal Medicine

## 2011-07-09 ENCOUNTER — Ambulatory Visit (INDEPENDENT_AMBULATORY_CARE_PROVIDER_SITE_OTHER): Payer: Managed Care, Other (non HMO) | Admitting: Internal Medicine

## 2011-07-09 ENCOUNTER — Encounter: Payer: Self-pay | Admitting: Internal Medicine

## 2011-07-09 VITALS — BP 141/73 | HR 69 | Ht 63.0 in | Wt 171.4 lb

## 2011-07-09 DIAGNOSIS — M797 Fibromyalgia: Secondary | ICD-10-CM

## 2011-07-09 DIAGNOSIS — E7800 Pure hypercholesterolemia, unspecified: Secondary | ICD-10-CM

## 2011-07-09 DIAGNOSIS — R0602 Shortness of breath: Secondary | ICD-10-CM

## 2011-07-09 DIAGNOSIS — R61 Generalized hyperhidrosis: Secondary | ICD-10-CM

## 2011-07-09 DIAGNOSIS — E785 Hyperlipidemia, unspecified: Secondary | ICD-10-CM

## 2011-07-09 LAB — BASIC METABOLIC PANEL
BUN: 8 mg/dL (ref 6–23)
Calcium: 9.6 mg/dL (ref 8.4–10.5)
GFR: 88.56 mL/min (ref >60.00)
Potassium: 3.6 mEq/L (ref 3.5–5.1)
Sodium: 138 mEq/L (ref 135–145)

## 2011-07-09 LAB — AST: AST: 20 U/L (ref 0–37)

## 2011-07-09 LAB — CBC WITH DIFFERENTIAL/PLATELET
Basophils Relative: 1.1 % (ref 0.0–3.0)
Eosinophils Relative: 10.4 % — ABNORMAL HIGH (ref 0.0–5.0)
Lymphocytes Relative: 23.6 % (ref 12.0–46.0)
Neutrophils Relative %: 51.8 % (ref 43.0–77.0)
Platelets: 249 10*3/uL (ref 150.0–400.0)
RBC: 4.28 Mil/uL (ref 3.87–5.11)
WBC: 6 10*3/uL (ref 4.5–10.5)

## 2011-07-09 LAB — LIPID PANEL
HDL: 59.3 mg/dL (ref >39.00)
Total CHOL/HDL Ratio: 4
VLDL: 45.4 mg/dL — ABNORMAL HIGH (ref 0.0–40.0)

## 2011-07-09 LAB — LDL CHOLESTEROL, DIRECT: Direct LDL: 160.6 mg/dL

## 2011-07-09 NOTE — Telephone Encounter (Signed)
I had called the patient's pharmacy to verify what they were dispensing to her. I have compared this with the patient and her bottles at home. Her medication list includes: 1) Aspirin 81 mg once daily 2) Alprazolam 0.25 mg BID PRN 3) amlodipine 5 mg once daily 4) escitalopram (lexapro) 10 mg once daily 5) hydrocodone-actaminophen 5/325 PRN 6) metoprolol succ 50 mg once daily 7) temazepam (restoril) 15 mg 1-2 qhs PRN 8) triameterene-hctz 37.5-25 mg once daily 9) Gabapentin 100 mg (prescribed TID, but she only takes it QD) once daily 10) vit B12 once daily 11) vit D3 once daily 12) vit C once daily  I will forward to Dr. Tenny Craw as an FYI and update her office note from today if this is still open. Otherwise, I will adjust from here.

## 2011-07-09 NOTE — Telephone Encounter (Signed)
Pt call to give med list  triamtiepene 25mg  qd alprazolawi 25mg  qd gabapentin 100mg  qd esoitalopram 10mg  prn amlopdipine 5mg  qd Temazepam 15mg  qhs ASA 81mg  qd

## 2011-07-09 NOTE — Progress Notes (Signed)
HPI Patient is a 77 year old with a history of HTN, HL.  Cath in 1986 showed normal coronaries.  I saw her last in 2010 Breathing has been pretty good unless she gets into a hurry. Breaks out into sweats daily, sometimes at night.  Occasionally SOB with spells. Echo in the fall was normal.  She cancelled stress test because of work.  Says she has slowed down since the fall.  Allergies  Allergen Reactions  . Gabapentin     REACTION: nausea  . Ibuprofen   . Iodine   . Simvastatin     REACTION: achy    Current Outpatient Prescriptions  Medication Sig Dispense Refill  . aspirin 81 MG EC tablet Take 81 mg by mouth daily.        Marland Kitchen ALPRAZolam (XANAX) 0.25 MG tablet Take 1 tablet (0.25 mg total) by mouth 2 (two) times daily as needed. For anxiety  60 tablet  3  . amLODipine (NORVASC) 5 MG tablet Take 1 tablet (5 mg total) by mouth daily.  30 tablet  5  . atorvastatin (LIPITOR) 10 MG tablet Take 1 tablet (10 mg total) by mouth daily. For cholesterol  30 tablet  5  . Cholecalciferol 1000 UNITS tablet Take 1,000 Units by mouth daily.        Marland Kitchen dexlansoprazole (DEXILANT) 60 MG capsule Take 1 capsule (60 mg total) by mouth daily.  30 capsule  6  . dexlansoprazole (DEXILANT) 60 MG capsule Take 60 mg by mouth daily.      Marland Kitchen escitalopram (LEXAPRO) 10 MG tablet Take 1 tablet (10 mg total) by mouth daily.  30 tablet  5  . HYDROcodone-acetaminophen (NORCO) 5-325 MG per tablet Take 1 tablet by mouth 4 (four) times daily as needed. For pain  120 tablet  2  . metoprolol succinate (TOPROL XL) 50 MG 24 hr tablet Take 1 tablet (50 mg total) by mouth daily. Take at hs  30 tablet  5  . nitroGLYCERIN (NITROSTAT) 0.4 MG SL tablet Place 1 tablet (0.4 mg total) under the tongue every 5 (five) minutes as needed for chest pain.  20 tablet  2  . potassium chloride SA (K-DUR,KLOR-CON) 20 MEQ tablet Take 1 tablet (20 mEq total) by mouth daily.  30 tablet  5  . ranitidine (ZANTAC) 150 MG capsule Take 150 mg by mouth daily.         . rosuvastatin (CRESTOR) 10 MG tablet Take 10 mg by mouth daily. For cholesterol       . temazepam (RESTORIL) 15 MG capsule Take 1-2 capsules (15-30 mg total) by mouth at bedtime as needed.  60 capsule  5  . triamterene-hydrochlorothiazide (MAXZIDE-25) 37.5-25 MG per tablet take 1 tablet by mouth once daily  30 tablet  2    Past Medical History  Diagnosis Date  . History of pneumonia 2008  . Hyperlipemia   . HTN (hypertension)   . Osteoarthritis   . CAD (coronary artery disease)     DR Tenny Craw  . MI (myocardial infarction)     DR Tenny Craw    Past Surgical History  Procedure Date  . Tonsillectomy   . Partial hysterectomy   . Knee arthroscopy     left    Family History  Problem Relation Age of Onset  . Hypertension    . Glaucoma Father   . Hypertension Mother     History   Social History  . Marital Status: Widowed    Spouse Name: N/A  Number of Children: N/A  . Years of Education: N/A   Occupational History  . 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 Smoking status: Never Smoker   . Smokeless tobacco: Not on file  . Alcohol Use: No  . Drug Use: No  . Sexually Active: No   Other Topics Concern  . Not on file   Social History Narrative  . No narrative on file    Review of Systems:  All systems reviewed.  They are negative to the above problem except as previously stated.  Vital Signs: BP 141/73  Pulse 69  Ht 5\' 3"  (1.6 m)  Wt 171 lb 6.4 oz (77.747 kg)  BMI 30.36 kg/m2  Physical Exam Patient is in NAD HEENT:  Normocephalic, atraumatic. EOMI, PERRLA.  Neck: JVP is normal. No thyromegaly. No bruits.  Lungs: clear to auscultation. No rales no wheezes.  Heart: Regular rate and rhythm. Normal S1, S2. No S3.   No significant murmurs. PMI not displaced.  Abdomen:  Supple, nontender. Normal bowel sounds. No masses. No hepatomegaly.  Extremities:   Good distal pulses throughout. No lower extremity edema.    Musculoskeletal :moving all extremities.  Neuro:   alert and oriented x3.  CN II-XII grossly intact.   Assessment and Plan:  1.  SOB  Concerning with her slowing down.  I had recomm a stress test in the past  I would recomm again to make sure that this, along with sweats, is not an anginal equivalent  2.  HTN  Adequate control  3.  HL  Not toerating the new statin that she is on because of achiness.  I will need to clarify meds with pharmacy.  I have recomm that she get labs today.  Then, stop agent  Follow symptomsl  She said she did better after stopping crestor in the past.  4.  Hypokalemia:  Will check bmet  5.  Sweats:  Question angina equivalent.  Will also check CBC.  TSH normal in Feb.

## 2011-07-09 NOTE — Patient Instructions (Signed)
Your physician recommends that you have lab work today: lipid/CK-total/AST/cbc/bmp  Your physician has requested that you have an exercise stress myoview. For further information please visit [email or web address] Please follow instruction sheet, as given.  Your physician has recommended you make the following change in your medication:  1) Stop your cholesterol medication.  Your physician wants you to follow-up in: 6 months with Dr. Tenny Craw. You will receive a reminder letter in the mail two months in advance. If you don't receive a letter, please call our office to schedule the follow-up appointment.

## 2011-07-10 LAB — CK TOTAL AND CKMB (NOT AT ARMC)
CK, MB: 1 ng/mL (ref 0.3–4.0)
Total CK: 69 U/L (ref 7–177)

## 2011-07-11 ENCOUNTER — Ambulatory Visit (INDEPENDENT_AMBULATORY_CARE_PROVIDER_SITE_OTHER): Payer: Managed Care, Other (non HMO) | Admitting: Internal Medicine

## 2011-07-11 ENCOUNTER — Encounter: Payer: Self-pay | Admitting: Internal Medicine

## 2011-07-11 VITALS — BP 128/80 | HR 84 | Temp 98.6°F | Resp 16 | Wt 168.0 lb

## 2011-07-11 DIAGNOSIS — M25569 Pain in unspecified knee: Secondary | ICD-10-CM

## 2011-07-11 DIAGNOSIS — I2510 Atherosclerotic heart disease of native coronary artery without angina pectoris: Secondary | ICD-10-CM

## 2011-07-11 DIAGNOSIS — E785 Hyperlipidemia, unspecified: Secondary | ICD-10-CM

## 2011-07-11 DIAGNOSIS — F329 Major depressive disorder, single episode, unspecified: Secondary | ICD-10-CM

## 2011-07-11 DIAGNOSIS — F32A Depression, unspecified: Secondary | ICD-10-CM

## 2011-07-11 DIAGNOSIS — R7309 Other abnormal glucose: Secondary | ICD-10-CM

## 2011-07-11 DIAGNOSIS — I10 Essential (primary) hypertension: Secondary | ICD-10-CM

## 2011-07-11 DIAGNOSIS — K219 Gastro-esophageal reflux disease without esophagitis: Secondary | ICD-10-CM

## 2011-07-11 DIAGNOSIS — M25579 Pain in unspecified ankle and joints of unspecified foot: Secondary | ICD-10-CM

## 2011-07-11 MED ORDER — DICLOFENAC SODIUM 1 % TD GEL: 1.0000 "application " | Freq: Four times a day (QID) | TRANSDERMAL | Status: DC

## 2011-07-11 MED ORDER — TRIAMCINOLONE ACETONIDE 0.5 % EX CREA: TOPICAL_CREAM | Freq: Three times a day (TID) | CUTANEOUS | Status: DC

## 2011-07-11 NOTE — Progress Notes (Signed)
Patient ID: Terri King, female   DOB: Jul 12, 1934, 77 y.o.   MRN: [id number]  Subjective:    Patient ID: Terri King, female    DOB: Mar 31, 1934, 77 y.o.   MRN: [id number]  Ankle Pain  Pertinent negatives include no numbness.  Rash Pertinent negatives include no congestion, cough or fatigue.    The patient presents for a follow-up of  chronic hypertension, chronic dyslipidemia, OA controlled with medicines F/u depression, anxiety - better on Rx C/o R wrist rash - itchy x 1 wk R ankle swelling x 2 wks, mild pain - no injury  Wt Readings from Last 3 Encounters:  07/11/11 168 lb (76.204 kg)  07/09/11 171 lb 6.4 oz (77.747 kg)  03/28/11 174 lb (78.926 kg)   BP Readings from Last 3 Encounters:  07/11/11 128/80  07/09/11 141/73  03/28/11 140/80      Review of Systems  Constitutional: Negative for chills, activity change, appetite change, fatigue and unexpected weight change.  HENT: Negative for congestion, mouth sores and sinus pressure.   Eyes: Negative for visual disturbance.  Respiratory: Negative for cough and chest tightness.   Gastrointestinal: Negative for nausea and abdominal pain.  Genitourinary: Negative for frequency, difficulty urinating and vaginal pain.  Musculoskeletal: Positive for myalgias, back pain and arthralgias. Negative for gait problem.  Skin: Positive for rash. Negative for pallor and wound.  Neurological: Negative for dizziness, tremors, weakness, numbness and headaches.  Psychiatric/Behavioral: Positive for sleep disturbance. Negative for suicidal ideas, behavioral problems, confusion and decreased concentration. The patient is nervous/anxious.        Objective:   Physical Exam  Constitutional: She appears well-developed. No distress.  HENT:  Head: Normocephalic.  Right Ear: External ear normal.  Left Ear: External ear normal.  Nose: Nose normal.  Mouth/Throat: Oropharynx is clear and moist.  Eyes: Conjunctivae are normal. Pupils are equal,  round, and reactive to light. Right eye exhibits no discharge. Left eye exhibits no discharge.  Neck: Normal range of motion. Neck supple. No JVD present. No tracheal deviation present. No thyromegaly present.  Cardiovascular: Normal rate, regular rhythm and normal heart sounds.   Pulmonary/Chest: No stridor. No respiratory distress. She has no wheezes.  Abdominal: Soft. Bowel sounds are normal. She exhibits no distension and no mass. There is no tenderness. There is no rebound and no guarding.  Musculoskeletal: She exhibits tenderness (L knee is in a brace). She exhibits no edema.  Lymphadenopathy:    She has no cervical adenopathy.  Neurological: She displays normal reflexes. No cranial nerve deficit. She exhibits normal muscle tone. Coordination normal.  Skin: Rash (R wrist vesic rash) noted. No erythema.  Psychiatric: She has a normal mood and affect. Her behavior is normal. Judgment and thought content normal.       Not suicidal or homicidal  R ankle lat swelling  Lab Results  Component Value Date   WBC 6.0 07/09/2011   HGB 13.4 07/09/2011   HCT 39.8 07/09/2011   PLT 249.0 07/09/2011   GLUCOSE 101* 07/09/2011   CHOL 266* 07/09/2011   TRIG 227.0* 07/09/2011   HDL 59.30 07/09/2011   LDLDIRECT 160.6 07/09/2011   LDLCALC 101* 09/02/2009   ALT 17 03/28/2011   AST 20 07/09/2011   NA 138 07/09/2011   K 3.6 07/09/2011   CL 103 07/09/2011   CREATININE 0.8 07/09/2011   BUN 8 07/09/2011   CO2 29 07/09/2011   TSH 1.25 03/28/2011   HGBA1C 6.0 03/28/2011  Assessment & Plan:

## 2011-07-11 NOTE — Assessment & Plan Note (Signed)
Better on Metformin

## 2011-07-11 NOTE — Assessment & Plan Note (Signed)
Continue with current prescription therapy as reflected on the Med list.  

## 2011-07-11 NOTE — Assessment & Plan Note (Signed)
Better Continue with current prescription therapy as reflected on the Med list.  

## 2011-07-11 NOTE — Assessment & Plan Note (Signed)
Per Dr Tenny Craw CL is pending

## 2011-07-11 NOTE — Assessment & Plan Note (Signed)
  On diet  

## 2011-07-11 NOTE — Patient Instructions (Signed)
BP Readings from Last 3 Encounters:  07/11/11 128/80  07/09/11 141/73  03/28/11 140/80   Wt Readings from Last 3 Encounters:  07/11/11 168 lb (76.204 kg)  07/09/11 171 lb 6.4 oz (77.747 kg)  03/28/11 174 lb (78.926 kg)

## 2011-07-11 NOTE — Assessment & Plan Note (Signed)
Continue with current prescription therapy as reflected on the Med list. Better on meds

## 2011-07-11 NOTE — Assessment & Plan Note (Addendum)
Voltaren gel Elastic brace

## 2011-07-17 ENCOUNTER — Encounter: Payer: Self-pay | Admitting: Internal Medicine

## 2011-07-17 ENCOUNTER — Ambulatory Visit (HOSPITAL_COMMUNITY): Payer: Managed Care, Other (non HMO) | Attending: Internal Medicine | Admitting: Radiology

## 2011-07-17 VITALS — BP 136/75 | Ht 63.0 in | Wt 170.0 lb

## 2011-07-17 DIAGNOSIS — R61 Generalized hyperhidrosis: Secondary | ICD-10-CM | POA: Insufficient documentation

## 2011-07-17 DIAGNOSIS — R0602 Shortness of breath: Secondary | ICD-10-CM

## 2011-07-17 DIAGNOSIS — E785 Hyperlipidemia, unspecified: Secondary | ICD-10-CM | POA: Insufficient documentation

## 2011-07-17 DIAGNOSIS — I2510 Atherosclerotic heart disease of native coronary artery without angina pectoris: Secondary | ICD-10-CM

## 2011-07-17 DIAGNOSIS — E7800 Pure hypercholesterolemia, unspecified: Secondary | ICD-10-CM

## 2011-07-17 DIAGNOSIS — I10 Essential (primary) hypertension: Secondary | ICD-10-CM | POA: Insufficient documentation

## 2011-07-17 DIAGNOSIS — R0989 Other specified symptoms and signs involving the circulatory and respiratory systems: Secondary | ICD-10-CM | POA: Insufficient documentation

## 2011-07-17 DIAGNOSIS — R079 Chest pain, unspecified: Secondary | ICD-10-CM

## 2011-07-17 DIAGNOSIS — R0609 Other forms of dyspnea: Secondary | ICD-10-CM | POA: Insufficient documentation

## 2011-07-17 DIAGNOSIS — Z8249 Family history of ischemic heart disease and other diseases of the circulatory system: Secondary | ICD-10-CM | POA: Insufficient documentation

## 2011-07-17 MED ORDER — TECHNETIUM TC 99M TETROFOSMIN IV KIT
33.0000 | PACK | Freq: Once | INTRAVENOUS | Status: AC | PRN
  Administered 2011-07-17: 33 via INTRAVENOUS

## 2011-07-17 MED ORDER — REGADENOSON 0.4 MG/5ML IV SOLN
0.4000 mg | Freq: Once | INTRAVENOUS | Status: AC
  Administered 2011-07-17: 0.4 mg via INTRAVENOUS

## 2011-07-17 MED ORDER — TECHNETIUM TC 99M TETROFOSMIN IV KIT
11.0000 | PACK | Freq: Once | INTRAVENOUS | Status: AC | PRN
  Administered 2011-07-17: 11 via INTRAVENOUS

## 2011-07-17 NOTE — Progress Notes (Signed)
[facility] SITE 3 NUCLEAR MED [address] [place] [address] [phone]  Cardiology Nuclear Med Study  Terri King is a 77 y.o. female     MRN : [id number]     DOB: 1934-05-30  Procedure Date: 07/17/2011  Nuclear Med Background Indication for Stress Test:  Evaluation for Ischemia History:  '86 Heart Cath: NL '99 MPS: NL EF: 72% MI, 6/12: ECHO: EF: 60% mild TR Cardiac Risk Factors: Family History - CAD, Hypertension and Lipids  Symptoms:  Diaphoresis, DOE and SOB   Nuclear Pre-Procedure Caffeine/Decaff Intake:  None> 12 hrs NPO After: 11:00pm   Lungs:  clear O2 Sat: 96% on room air. IV 0.9% NS with Angio Cath:  20g  IV Site: R Antecubital x 1, tolerated welll IV Started by:  Irean Hong, RN  Chest Size (in):  36 Cup Size: C  Height: 5\' 3"  (1.6 m)  Weight:  170 lb (77.111 kg)  BMI:  Body mass index is 30.11 kg/(m^2). Tech Comments:  Changed to low level lexiscan due to  unable to walk fast on treadmill per patient    Nuclear Med Study 1 or 2 day study: 1 day  Stress Test Type:  Lexiscan  Reading MD: Cassell Clement, MD  Order Authorizing Provider:  Dietrich Pates, MD  Resting Radionuclide: Technetium 47m Tetrofosmin  Resting Radionuclide Dose: 11.0 mCi   Stress Radionuclide:  Technetium 8m Tetrofosmin  Stress Radionuclide Dose: 32.0 mCi           Stress Protocol Rest HR: 73 Stress HR: 118  Rest BP: 136/75 Stress BP: 175/57  Exercise Time (min): n/a METS: n/a   Predicted Max HR: 145 bpm % Max HR: 81.38 bpm Rate Pressure Product: 21308   Dose of Adenosine (mg):  n/a Dose of Lexiscan: 0.4 mg  Dose of Atropine (mg): n/a Dose of Dobutamine: n/a mcg/kg/min (at max HR)  Stress Test Technologist: Milana Na, EMT-P  Nuclear Technologist:  Domenic Polite, CNMT     Rest Procedure:  Myocardial perfusion imaging was performed at rest 45 minutes following the intravenous administration of Technetium 7m Tetrofosmin. Rest ECG: NSR - Normal  EKG  Stress Procedure:  The patient received IV Lexiscan 0.4 mg over 15-seconds.  Technetium 43m Tetrofosmin injected at 30-seconds.  There were no significant changes, + sob, fatigue, feel weird all over, and rare pacs with Lexiscan.  Quantitative spect images were obtained after a 45 minute delay. Stress ECG: No significant change from baseline ECG  QPS Raw Data Images:  Normal; no motion artifact; normal heart/lung ratio. Stress Images:  Normal homogeneous uptake in all areas of the myocardium. Rest Images:  Normal homogeneous uptake in all areas of the myocardium. Subtraction (SDS):  No evidence of ischemia. Transient Ischemic Dilatation (Normal <1.22):  1.10 Lung/Heart Ratio (Normal <0.45):  0.17  Quantitative Gated Spect Images QGS EDV:  58 ml QGS ESV:  14 ml  Impression Exercise Capacity:  Lexiscan with low level exercise. BP Response:  Normal blood pressure response. Clinical Symptoms:  No chest pain. ECG Impression:  No significant ST segment change suggestive of ischemia. Comparison with Prior Nuclear Study: No images to compare  Overall Impression:  Normal stress nuclear study.  LV Ejection Fraction: 76%.  LV Wall Motion:  NL LV Function; NL Wall Motion  [employer]

## 2011-07-18 ENCOUNTER — Other Ambulatory Visit: Payer: Self-pay | Admitting: Internal Medicine

## 2011-07-18 MED ORDER — ALPRAZOLAM 0.25 MG PO TABS: ORAL_TABLET | ORAL | Status: DC

## 2011-07-18 NOTE — Telephone Encounter (Signed)
Addended by: Anselm Jungling on: 07/18/2011 02:16 PM   Modules accepted: Orders

## 2011-07-18 NOTE — Telephone Encounter (Signed)
Addended by: Cristy Hilts F on: 07/18/2011 02:05 PM   Modules accepted: Orders

## 2011-07-18 NOTE — Telephone Encounter (Signed)
Rx called into pharmacy [facility]) because it Rx would not print

## 2011-07-24 NOTE — Telephone Encounter (Signed)
Would she be willing to try Crestor 2.5 mg 3x per wk? F/U lipids and AST in 8 wks with CK

## 2011-07-25 ENCOUNTER — Encounter: Payer: Self-pay | Admitting: Internal Medicine

## 2011-07-25 MED ORDER — ROSUVASTATIN CALCIUM 5 MG PO TABS: ORAL_TABLET | ORAL | Status: DC

## 2011-07-25 NOTE — Telephone Encounter (Signed)
Patient is willing to take Crestor 2.5 mg 3 times a week. A prescription was send electronically to [facility]. Patient has an appointment for Fasting lipid panel  AST and CK for July 31 st. Patient aware.

## 2011-08-14 ENCOUNTER — Telehealth: Payer: Self-pay

## 2011-08-14 NOTE — Telephone Encounter (Signed)
OK to fill this prescription with additional refills x3 Thank you!  

## 2011-08-14 NOTE — Telephone Encounter (Signed)
Received fax refill request from pharmacy requesting Temazepam 15 mg 1-2 cap qhs prn. Medication last filled 02/22/11 #60/5 rf and pt last seen 07/11/11. Please advise Thanks

## 2011-08-15 MED ORDER — TEMAZEPAM 15 MG PO CAPS: 15.0000 mg | ORAL_CAPSULE | Freq: Every evening | ORAL | Status: DC | PRN

## 2011-08-15 NOTE — Telephone Encounter (Signed)
Done

## 2011-09-15 ENCOUNTER — Other Ambulatory Visit: Payer: Self-pay | Admitting: Internal Medicine

## 2011-09-17 ENCOUNTER — Telehealth: Payer: Self-pay | Admitting: *Deleted

## 2011-09-17 NOTE — Telephone Encounter (Signed)
OK to fill this prescription with additional refills x1 Thank you!  

## 2011-09-17 NOTE — Telephone Encounter (Signed)
Rf req for Hydroco/APAP 5/325 1 po qid prn. Ok to Rf? 

## 2011-09-18 MED ORDER — HYDROCODONE-ACETAMINOPHEN 5-325 MG PO TABS: 1.0000 | ORAL_TABLET | Freq: Four times a day (QID) | ORAL | Status: DC | PRN

## 2011-09-18 NOTE — Telephone Encounter (Signed)
Done

## 2011-09-19 ENCOUNTER — Other Ambulatory Visit: Payer: Managed Care, Other (non HMO)

## 2011-09-19 ENCOUNTER — Ambulatory Visit (INDEPENDENT_AMBULATORY_CARE_PROVIDER_SITE_OTHER): Payer: Managed Care, Other (non HMO) | Admitting: Internal Medicine

## 2011-09-19 ENCOUNTER — Encounter: Payer: Self-pay | Admitting: Internal Medicine

## 2011-09-19 VITALS — BP 150/72 | HR 72 | Temp 98.1°F | Resp 16 | Wt 168.0 lb

## 2011-09-19 DIAGNOSIS — E785 Hyperlipidemia, unspecified: Secondary | ICD-10-CM

## 2011-09-19 DIAGNOSIS — I2510 Atherosclerotic heart disease of native coronary artery without angina pectoris: Secondary | ICD-10-CM

## 2011-09-19 DIAGNOSIS — R7309 Other abnormal glucose: Secondary | ICD-10-CM

## 2011-09-19 DIAGNOSIS — M1990 Unspecified osteoarthritis, unspecified site: Secondary | ICD-10-CM

## 2011-09-19 DIAGNOSIS — H9209 Otalgia, unspecified ear: Secondary | ICD-10-CM

## 2011-09-19 DIAGNOSIS — M2550 Pain in unspecified joint: Secondary | ICD-10-CM

## 2011-09-19 MED ORDER — ROSUVASTATIN CALCIUM 5 MG PO TABS: ORAL_TABLET | ORAL | Status: DC

## 2011-09-19 NOTE — Assessment & Plan Note (Signed)
Continue with current prescription therapy as reflected on the Med list.  

## 2011-09-19 NOTE — Assessment & Plan Note (Signed)
Dr Tenny Craw started Crestor Continue with current prescription therapy as reflected on the Med list.

## 2011-09-19 NOTE — Progress Notes (Signed)
  Subjective:    Patient ID: Terri King, female    DOB: 07-13-1934, 77 y.o.   MRN: [id number]  HPI  The patient presents for a follow-up of  Chronic CAD, hypertension, chronic dyslipidemia, OA controlled with medicines F/u depression, anxiety - better on Rx C/o scattered rash - occasionally  F/u on R ankle swelling -resolved on vacation  Wt Readings from Last 3 Encounters:  09/19/11 168 lb (76.204 kg)  07/17/11 170 lb (77.111 kg)  07/11/11 168 lb (76.204 kg)   BP Readings from Last 3 Encounters:  09/19/11 150/72  07/17/11 136/75  07/11/11 128/80      Review of Systems  Constitutional: Negative for chills, activity change, appetite change and unexpected weight change.  HENT: Negative for mouth sores and sinus pressure.   Eyes: Negative for visual disturbance.  Respiratory: Negative for chest tightness.   Gastrointestinal: Negative for nausea and abdominal pain.  Genitourinary: Negative for frequency, difficulty urinating and vaginal pain.  Musculoskeletal: Positive for myalgias, back pain and arthralgias. Negative for gait problem.  Skin: Negative for pallor and wound.  Neurological: Negative for dizziness, tremors, weakness and headaches.  Psychiatric/Behavioral: Positive for disturbed wake/sleep cycle. Negative for suicidal ideas, behavioral problems, confusion and decreased concentration. The patient is nervous/anxious.        Objective:   Physical Exam  Constitutional: She appears well-developed. No distress.  HENT:  Head: Normocephalic.  Right Ear: External ear normal.  Left Ear: External ear normal.  Nose: Nose normal.  Mouth/Throat: Oropharynx is clear and moist.  Eyes: Conjunctivae are normal. Pupils are equal, round, and reactive to light. Right eye exhibits no discharge. Left eye exhibits no discharge.  Neck: Normal range of motion. Neck supple. No JVD present. No tracheal deviation present. No thyromegaly present.  Cardiovascular: Normal rate, regular  rhythm and normal heart sounds.   Pulmonary/Chest: No stridor. No respiratory distress. She has no wheezes.  Abdominal: Soft. Bowel sounds are normal. She exhibits no distension and no mass. There is no tenderness. There is no rebound and no guarding.  Musculoskeletal: She exhibits tenderness (L knee is in a brace). She exhibits no edema.  Lymphadenopathy:    She has no cervical adenopathy.  Neurological: She displays normal reflexes. No cranial nerve deficit. She exhibits normal muscle tone. Coordination normal.  Skin: Rash (R wrist vesic rash) noted. No erythema.  Psychiatric: She has a normal mood and affect. Her behavior is normal. Judgment and thought content normal.       Not suicidal or homicidal  R ankle lat w/o swelling  Lab Results  Component Value Date   WBC 6.0 07/09/2011   HGB 13.4 07/09/2011   HCT 39.8 07/09/2011   PLT 249.0 07/09/2011   GLUCOSE 101* 07/09/2011   CHOL 266* 07/09/2011   TRIG 227.0* 07/09/2011   HDL 59.30 07/09/2011   LDLDIRECT 160.6 07/09/2011   LDLCALC 101* 09/02/2009   ALT 17 03/28/2011   AST 20 07/09/2011   NA 138 07/09/2011   K 3.6 07/09/2011   CL 103 07/09/2011   CREATININE 0.8 07/09/2011   BUN 8 07/09/2011   CO2 29 07/09/2011   TSH 1.25 03/28/2011   HGBA1C 6.0 03/28/2011         Assessment & Plan:

## 2011-09-19 NOTE — Assessment & Plan Note (Signed)
Chronic Myalgias from the statins - seems to be able to [employer]

## 2011-09-20 ENCOUNTER — Ambulatory Visit (INDEPENDENT_AMBULATORY_CARE_PROVIDER_SITE_OTHER): Payer: Managed Care, Other (non HMO) | Admitting: *Deleted

## 2011-09-20 DIAGNOSIS — I10 Essential (primary) hypertension: Secondary | ICD-10-CM

## 2011-09-20 DIAGNOSIS — I2510 Atherosclerotic heart disease of native coronary artery without angina pectoris: Secondary | ICD-10-CM

## 2011-09-20 DIAGNOSIS — E785 Hyperlipidemia, unspecified: Secondary | ICD-10-CM

## 2011-09-20 LAB — CARDIAC PANEL
CK-MB: 0.9 ng/mL (ref 0.3–4.0)
Relative Index: 1.2 calc (ref 0.0–2.5)
Total CK: 76 U/L (ref 7–177)

## 2011-09-20 LAB — AST: AST: 27 U/L (ref 0–37)

## 2011-09-21 ENCOUNTER — Other Ambulatory Visit: Payer: Self-pay | Admitting: *Deleted

## 2011-09-21 DIAGNOSIS — E782 Mixed hyperlipidemia: Secondary | ICD-10-CM

## 2011-11-14 ENCOUNTER — Telehealth: Payer: Self-pay | Admitting: *Deleted

## 2011-11-14 NOTE — Telephone Encounter (Signed)
1 po qd sig on the Rx Thx

## 2011-11-14 NOTE — Telephone Encounter (Signed)
Left detailed mess informing pt of below.  

## 2011-11-14 NOTE — Telephone Encounter (Signed)
Rec fax stating pt said you told her to take Crestor 1 po qd. Please advise as sig reads 1/2 tab on M, W, Fri.

## 2011-11-21 ENCOUNTER — Other Ambulatory Visit (INDEPENDENT_AMBULATORY_CARE_PROVIDER_SITE_OTHER): Payer: Managed Care, Other (non HMO)

## 2011-11-21 DIAGNOSIS — E782 Mixed hyperlipidemia: Secondary | ICD-10-CM

## 2011-11-21 LAB — LIPID PANEL
Cholesterol: 212 mg/dL — ABNORMAL HIGH (ref 0–200)
HDL: 63.4 mg/dL (ref >39.00)
Triglycerides: 140 mg/dL (ref 0.0–149.0)

## 2011-11-21 LAB — AST: AST: 25 U/L (ref 0–37)

## 2011-11-22 ENCOUNTER — Telehealth: Payer: Self-pay | Admitting: *Deleted

## 2011-11-22 DIAGNOSIS — E785 Hyperlipidemia, unspecified: Secondary | ICD-10-CM

## 2011-11-22 MED ORDER — ROSUVASTATIN CALCIUM 10 MG PO TABS: 10.0000 mg | ORAL_TABLET | Freq: Every day | ORAL | Status: DC

## 2011-11-22 NOTE — Addendum Note (Signed)
Addended by: Lisabeth Devoid F on: 11/22/2011 03:14 PM   Modules accepted: Orders

## 2011-11-22 NOTE — Telephone Encounter (Signed)
Message copied by Barrie Folk on Thu Nov 22, 2011  2:55 PM ------      Message from: Dietrich Pates V      Created: Thu Nov 22, 2011 12:35 PM       LDL is a little better. Would try to increase Crestor to 10.  Check lipids in 8 wks. With AST

## 2011-11-22 NOTE — Telephone Encounter (Signed)
I spoke with pt and she will increase crestor to 10mg  every MWF. Order sent in. Labs scheduled with pt for 02/04/12 Mylo Red RN

## 2011-12-06 ENCOUNTER — Telehealth: Payer: Self-pay | Admitting: *Deleted

## 2011-12-06 MED ORDER — METOPROLOL SUCCINATE ER 50 MG PO TB24: 50.0000 mg | ORAL_TABLET | Freq: Every day | ORAL | Status: DC

## 2011-12-06 NOTE — Telephone Encounter (Signed)
Rf req for Temazepam 15 mg 1-2 po qhs prn. Last filled 11/11/11. Ok to RF?

## 2011-12-06 NOTE — Telephone Encounter (Signed)
OK to fill this prescription with additional refills x2 Thank you!  

## 2011-12-07 MED ORDER — TEMAZEPAM 15 MG PO CAPS: 15.0000 mg | ORAL_CAPSULE | Freq: Every evening | ORAL | Status: DC | PRN

## 2011-12-07 NOTE — Telephone Encounter (Signed)
Done

## 2012-01-03 ENCOUNTER — Other Ambulatory Visit: Payer: Self-pay | Admitting: Internal Medicine

## 2012-01-08 ENCOUNTER — Telehealth: Payer: Self-pay | Admitting: *Deleted

## 2012-01-08 NOTE — Telephone Encounter (Signed)
Pt is scheduled to have LKR on 01/23/12. She needs your clearance note to be faxed to Dr. Eulah Pont at [phone].

## 2012-01-10 NOTE — Telephone Encounter (Signed)
pls sch ov Thx

## 2012-01-10 NOTE — Telephone Encounter (Signed)
OV set for 11/25/13at 9:30am.

## 2012-01-11 ENCOUNTER — Encounter: Payer: Self-pay | Admitting: Internal Medicine

## 2012-01-11 ENCOUNTER — Ambulatory Visit (INDEPENDENT_AMBULATORY_CARE_PROVIDER_SITE_OTHER): Payer: Managed Care, Other (non HMO) | Admitting: Internal Medicine

## 2012-01-11 VITALS — BP 147/70 | HR 65 | Ht 63.6 in | Wt 179.0 lb

## 2012-01-11 DIAGNOSIS — I10 Essential (primary) hypertension: Secondary | ICD-10-CM

## 2012-01-11 NOTE — Patient Instructions (Signed)
Your physician wants you to follow-up in:June/July 2013 You will receive a reminder letter in the mail two months in advance. If you don't receive a letter, please call our office to schedule the follow-up appointment.  

## 2012-01-11 NOTE — Progress Notes (Signed)
HPI Patient is a 77 year old with a history of HTN, HL  Cath in 1986 showed normal coronary arteries.  I saw her in May.  At that time she complained of SOB, slowing down.   Had problems with achiness from statin. Stress myoview was normal.  Since seen breathing has been ok  NoCP Still achy with crestor Having knee surgery in a couple weeks.  Allergies  Allergen Reactions  . Gabapentin     REACTION: nausea  . Ibuprofen   . Iodine   . Simvastatin     REACTION: achy    Current Outpatient Prescriptions  Medication Sig Dispense Refill  . amLODipine (NORVASC) 5 MG tablet take 1 tablet by mouth once daily  30 tablet  5  . Ascorbic Acid (VITAMIN C) 100 MG tablet Take 100 mg by mouth daily.      Marland Kitchen aspirin 81 MG EC tablet Take 81 mg by mouth daily.        . cholecalciferol (VITAMIN D) 400 UNITS TABS Take 400 Units by mouth daily.      . diclofenac sodium (VOLTAREN) 1 % GEL Apply 1 application topically 4 (four) times daily.  100 g  3  . escitalopram (LEXAPRO) 10 MG tablet take 1 tablet by mouth once daily  30 tablet  5  . gabapentin (NEURONTIN) 100 MG capsule take 1 capsule by mouth three times a day if needed  90 capsule  5  . HYDROcodone-acetaminophen (NORCO/VICODIN) 5-325 MG per tablet Take 1 tablet by mouth 4 (four) times daily as needed. For pain  120 tablet  1  . metoprolol succinate (TOPROL XL) 50 MG 24 hr tablet Take 1 tablet (50 mg total) by mouth daily. Take at hs  30 tablet  5  . rosuvastatin (CRESTOR) 10 MG tablet Take 1 tablet (10 mg total) by mouth daily. 1 tab on m w f  90 tablet  3  . temazepam (RESTORIL) 15 MG capsule Take 1-2 capsules (15-30 mg total) by mouth at bedtime as needed.  60 capsule  2  . triamcinolone cream (KENALOG) 0.5 % Apply topically 3 (three) times daily. rash  60 g  0  . triamterene-hydrochlorothiazide (MAXZIDE-25) 37.5-25 MG per tablet take 1 tablet by mouth once daily  30 tablet  5  . vitamin B-12 (CYANOCOBALAMIN) 100 MCG tablet Take 100 mcg by mouth  daily.        Past Medical History  Diagnosis Date  . History of pneumonia 2008  . Hyperlipemia   . HTN (hypertension)   . Osteoarthritis   . CAD (coronary artery disease)     DR Tenny Craw  . MI (myocardial infarction)     DR Tenny Craw    Past Surgical History  Procedure Date  . Tonsillectomy   . Partial hysterectomy   . Knee arthroscopy     left    Family History  Problem Relation Age of Onset  . Hypertension    . Glaucoma Father   . Hypertension Mother     History   Social History  . Marital Status: Widowed    Spouse Name: N/A    Number of Children: N/A  . Years of Education: N/A   Occupational History  . 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 Smoking status: Never Smoker   . Smokeless tobacco: Not on file  . Alcohol Use: No  . Drug Use: No  . Sexually Active: No  Other Topics Concern  . Not on file   Social History Narrative  . No narrative on file    Review of Systems:  All systems reviewed.  They are negative to the above problem except as previously stated.  Vital Signs: BP 147/70  Pulse 65  Ht 5' 3.6" (1.615 m)  Wt 179 lb (81.194 kg)  BMI 31.11 kg/m2  Physical Exam Patient is in NAD HEENT:  Normocephalic, atraumatic. EOMI, PERRLA.  Neck: JVP is normal.  No bruits.  Lungs: clear to auscultation. No rales no wheezes.  Heart: Regular rate and rhythm. Normal S1, S2. No S3.  II/VI systolic murmur base PMI not displaced.  Abdomen:  Supple, nontender. Normal bowel sounds. No masses. No hepatomegaly.  Extremities:   Good distal pulses throughout. No lower extremity edema.  Musculoskeletal :moving all extremities.  Neuro:   alert and oriented x3.  CN II-XII grossly intact.   Assessment and Plan:  1.  HTN  Adquate control 2.  HL  Will cut back on crestor to a few times per week  Just refilled  When don will call back to clinic .  QUestion trial of pravastatin.  Did not tolerate zocor and she reports  lipitor  Patient due to have surgery.  From cardiac standpoint is a low risk for complication  OK to proceed.  F/U in June 2014.

## 2012-01-14 ENCOUNTER — Encounter: Payer: Self-pay | Admitting: Internal Medicine

## 2012-01-14 ENCOUNTER — Ambulatory Visit (INDEPENDENT_AMBULATORY_CARE_PROVIDER_SITE_OTHER): Payer: Managed Care, Other (non HMO) | Admitting: Internal Medicine

## 2012-01-14 VITALS — BP 154/78 | HR 80 | Temp 98.4°F | Resp 16 | Wt 174.0 lb

## 2012-01-14 DIAGNOSIS — I2510 Atherosclerotic heart disease of native coronary artery without angina pectoris: Secondary | ICD-10-CM

## 2012-01-14 DIAGNOSIS — M25579 Pain in unspecified ankle and joints of unspecified foot: Secondary | ICD-10-CM

## 2012-01-14 DIAGNOSIS — E876 Hypokalemia: Secondary | ICD-10-CM

## 2012-01-14 DIAGNOSIS — R7309 Other abnormal glucose: Secondary | ICD-10-CM

## 2012-01-14 DIAGNOSIS — Z01818 Encounter for other preprocedural examination: Secondary | ICD-10-CM

## 2012-01-14 MED ORDER — DEXLANSOPRAZOLE 30 MG PO CPDR: 30.0000 mg | DELAYED_RELEASE_CAPSULE | Freq: Every day | ORAL | Status: DC

## 2012-01-14 NOTE — Progress Notes (Signed)
Subjective:    Patient ID: Terri King, female    DOB: 01-Jul-1934, 77 y.o.   MRN: [id number]  HPI  IM Consult Req by Dr D. Eulah Pont Reason: pre-op clearance for L TKR on 01/23/12  The patient presents for a follow-up of  Chronic CAD, hypertension, chronic dyslipidemia, OA controlled with medicines, stable F/u depression, anxiety - better on Rx  Past Medical History  Diagnosis Date  . History of pneumonia 2008  . Hyperlipemia   . HTN (hypertension)   . Osteoarthritis   . CAD (coronary artery disease)     DR Tenny Craw  . MI (myocardial infarction)     DR Tenny Craw   Past Surgical History  Procedure Date  . Tonsillectomy   . Partial hysterectomy   . Knee arthroscopy     left    reports that she has never smoked. She does not have any smokeless tobacco history on file. She reports that she does not drink alcohol or use illicit drugs. family history includes Glaucoma in her father and Hypertension in her mother and unspecified family member. Allergies  Allergen Reactions  . Gabapentin     REACTION: nausea  . Ibuprofen   . Iodine   . Simvastatin     REACTION: achy   Current Outpatient Prescriptions on File Prior to Visit  Medication Sig Dispense Refill  . amLODipine (NORVASC) 5 MG tablet take 1 tablet by mouth once daily  30 tablet  5  . Ascorbic Acid (VITAMIN C) 100 MG tablet Take 100 mg by mouth daily.      Marland Kitchen aspirin 81 MG EC tablet Take 81 mg by mouth daily.        . cholecalciferol (VITAMIN D) 400 UNITS TABS Take 400 Units by mouth daily.      . diclofenac sodium (VOLTAREN) 1 % GEL Apply 1 application topically 4 (four) times daily.  100 g  3  . escitalopram (LEXAPRO) 10 MG tablet take 1 tablet by mouth once daily  30 tablet  5  . gabapentin (NEURONTIN) 100 MG capsule take 1 capsule by mouth three times a day if needed  90 capsule  5  . HYDROcodone-acetaminophen (NORCO/VICODIN) 5-325 MG per tablet Take 1 tablet by mouth 4 (four) times daily as needed. For pain  120 tablet  1   . metoprolol succinate (TOPROL XL) 50 MG 24 hr tablet Take 1 tablet (50 mg total) by mouth daily. Take at hs  30 tablet  5  . rosuvastatin (CRESTOR) 10 MG tablet Take 1 tablet (10 mg total) by mouth daily. 1 tab on m w f  90 tablet  3  . temazepam (RESTORIL) 15 MG capsule Take 1-2 capsules (15-30 mg total) by mouth at bedtime as needed.  60 capsule  2  . triamcinolone cream (KENALOG) 0.5 % Apply topically 3 (three) times daily. rash  60 g  0  . triamterene-hydrochlorothiazide (MAXZIDE-25) 37.5-25 MG per tablet take 1 tablet by mouth once daily  30 tablet  5  . vitamin B-12 (CYANOCOBALAMIN) 100 MCG tablet Take 100 mcg by mouth daily.       BP 154/78  Pulse 80  Temp 98.4 F (36.9 C) (Oral)  Resp 16  Wt 174 lb (78.926 kg)    Wt Readings from Last 3 Encounters:  01/14/12 174 lb (78.926 kg)  01/11/12 179 lb (81.194 kg)  09/19/11 168 lb (76.204 kg)   BP Readings from Last 3 Encounters:  01/14/12 154/78  01/11/12 147/70  09/19/11  150/72      Review of Systems  Constitutional: Negative for chills, activity change, appetite change and unexpected weight change.  HENT: Negative for mouth sores and sinus pressure.   Eyes: Negative for visual disturbance.  Respiratory: Negative for chest tightness.   Gastrointestinal: Negative for nausea and abdominal pain.  Genitourinary: Negative for frequency, difficulty urinating and vaginal pain.  Musculoskeletal: Positive for myalgias, back pain and arthralgias. Negative for gait problem.  Skin: Negative for pallor and wound.  Neurological: Negative for dizziness, tremors, weakness and headaches.  Psychiatric/Behavioral: Positive for sleep disturbance. Negative for suicidal ideas, behavioral problems, confusion and decreased concentration. The patient is nervous/anxious.        Objective:   Physical Exam  Constitutional: She appears well-developed. No distress.  HENT:  Head: Normocephalic.  Right Ear: External ear normal.  Left Ear:  External ear normal.  Nose: Nose normal.  Mouth/Throat: Oropharynx is clear and moist.  Eyes: Conjunctivae normal are normal. Pupils are equal, round, and reactive to light. Right eye exhibits no discharge. Left eye exhibits no discharge.  Neck: Normal range of motion. Neck supple. No JVD present. No tracheal deviation present. No thyromegaly present.  Cardiovascular: Normal rate, regular rhythm and normal heart sounds.   Pulmonary/Chest: No stridor. No respiratory distress. She has no wheezes.  Abdominal: Soft. Bowel sounds are normal. She exhibits no distension and no mass. There is no tenderness. There is no rebound and no guarding.  Musculoskeletal: She exhibits tenderness (L knee is in a brace). She exhibits no edema.  Lymphadenopathy:    She has no cervical adenopathy.  Neurological: She displays normal reflexes. No cranial nerve deficit. She exhibits normal muscle tone. Coordination normal.  Skin: Rash (R wrist vesic rash) noted. No erythema.  Psychiatric: She has a normal mood and affect. Her behavior is normal. Judgment and thought content normal.       Not suicidal or homicidal  R ankle lat w/o swelling  Lab Results  Component Value Date   WBC 6.0 07/09/2011   HGB 13.4 07/09/2011   HCT 39.8 07/09/2011   PLT 249.0 07/09/2011   GLUCOSE 101* 07/09/2011   CHOL 212* 11/21/2011   TRIG 140.0 11/21/2011   HDL 63.40 11/21/2011   LDLDIRECT 119.2 11/21/2011   LDLCALC 101* 09/02/2009   ALT 17 03/28/2011   AST 25 11/21/2011   NA 138 07/09/2011   K 3.6 07/09/2011   CL 103 07/09/2011   CREATININE 0.8 07/09/2011   BUN 8 07/09/2011   CO2 29 07/09/2011   TSH 1.25 03/28/2011   HGBA1C 6.0 03/28/2011   07/17/11 stress test--  Impression  Exercise Capacity: Lexiscan with low level exercise.  BP Response: Normal blood pressure response.  Clinical Symptoms: No chest pain.  ECG Impression: No significant ST segment change suggestive of ischemia.  Comparison with Prior Nuclear Study: No images to compare    Overall Impression: Normal stress nuclear study.  LV Ejection Fraction: 76%. LV Wall Motion: NL LV Function; NL Wall Motion  Cassell Clement       Assessment & Plan:

## 2012-01-15 ENCOUNTER — Encounter (HOSPITAL_COMMUNITY): Payer: Self-pay | Admitting: Pharmacy Technician

## 2012-01-16 DIAGNOSIS — Z01818 Encounter for other preprocedural examination: Secondary | ICD-10-CM | POA: Insufficient documentation

## 2012-01-16 NOTE — Assessment & Plan Note (Addendum)
Terri King is clear medically for TKR surgery assuming her pre-op labs and EKG are acceptable (pending). Thank you!

## 2012-01-16 NOTE — Assessment & Plan Note (Signed)
Continue with current prescription therapy as reflected on the Med list.  

## 2012-01-16 NOTE — Assessment & Plan Note (Signed)
Better  

## 2012-01-16 NOTE — Assessment & Plan Note (Signed)
Resolved

## 2012-01-16 NOTE — Assessment & Plan Note (Signed)
preop labs

## 2012-01-16 NOTE — Assessment & Plan Note (Signed)
Monitoring CBGs 

## 2012-01-18 ENCOUNTER — Encounter (HOSPITAL_COMMUNITY)
Admission: RE | Admit: 2012-01-18 | Discharge: 2012-01-18 | Disposition: A | Discharge status: Home / Self Care | Payer: Managed Care, Other (non HMO) | Source: Ambulatory Visit | Attending: Orthopedic Surgery | Admitting: Orthopedic Surgery

## 2012-01-18 ENCOUNTER — Encounter (HOSPITAL_COMMUNITY): Payer: Self-pay

## 2012-01-18 LAB — URINALYSIS, ROUTINE W REFLEX MICROSCOPIC
Bilirubin Urine: NEGATIVE
Glucose, UA: NEGATIVE mg/dL
Hgb urine dipstick: NEGATIVE
Ketones, ur: NEGATIVE mg/dL
Protein, ur: NEGATIVE mg/dL
Urobilinogen, UA: 1 mg/dL (ref 0.0–1.0)

## 2012-01-18 LAB — CBC
HCT: 39.4 % (ref 36.0–46.0)
Hemoglobin: 13.3 g/dL (ref 12.0–15.0)
MCHC: 33.8 g/dL (ref 30.0–36.0)
MCV: 88.9 fL (ref 78.0–100.0)

## 2012-01-18 LAB — APTT: aPTT: 32 seconds (ref 24–37)

## 2012-01-18 LAB — TYPE AND SCREEN: Antibody Screen: NEGATIVE

## 2012-01-18 LAB — BASIC METABOLIC PANEL
BUN: 8 mg/dL (ref 6–23)
CO2: 29 mEq/L (ref 19–32)
Chloride: 101 mEq/L (ref 96–112)
Creatinine, Ser: 0.86 mg/dL (ref 0.50–1.10)
GFR calc Af Amer: 75 mL/min — ABNORMAL LOW (ref >90)
Potassium: 3 mEq/L — ABNORMAL LOW (ref 3.5–5.1)

## 2012-01-18 LAB — URINE MICROSCOPIC-ADD ON

## 2012-01-18 LAB — SURGICAL PCR SCREEN: MRSA, PCR: NEGATIVE

## 2012-01-18 LAB — PROTIME-INR: INR: 1 (ref 0.00–1.49)

## 2012-01-18 NOTE — Pre-Procedure Instructions (Signed)
20 Terri King  01/18/2012   Your procedure is scheduled on:  01/23/12  Report to [facility] at 730 AM.  Call this number if you have problems the morning of surgery: [phone]   Remember:   Do not eat food:After Midnight.    Take these medicines the morning of surgery with A SIP OF WATER: norvasc,dexilant,lexapro,hydrocodone,metoprolol,neurontin   Do not wear jewelry, make-up or nail polish.  Do not wear lotions, powders, or perfumes. You may wear deodorant.  Do not shave 48 hours prior to surgery. Men may shave face and neck.  Do not bring valuables to the hospital.  Contacts, dentures or bridgework may not be worn into surgery.  Leave suitcase in the car. After surgery it may be brought to your room.  For patients admitted to the hospital, checkout time is 11:00 AM the day of discharge.   Patients discharged the day of surgery will not be allowed to drive home.  Name and phone number of your driver: family  Special Instructions: Shower using CHG 2 nights before surgery and the night before surgery.  If you shower the day of surgery use CHG.  Use special wash - you have one bottle of CHG for all showers.  You should use approximately 1/3 of the bottle for each shower.   Please read over the following fact sheets that you were given: Pain Booklet, Coughing and Deep Breathing, Blood Transfusion Information, MRSA Information and Surgical Site Infection Prevention

## 2012-01-20 HISTORY — PX: JOINT REPLACEMENT: SHX530

## 2012-01-21 ENCOUNTER — Encounter (HOSPITAL_COMMUNITY): Payer: Self-pay | Admitting: Vascular Surgery

## 2012-01-21 NOTE — Consult Note (Signed)
Anesthesia chart review: Patient is a 77 year old female scheduled for left TKA by Dr. Eulah Pont on 01/23/2012. History includes nonsmoker, hypertension, osteoarthritis, pneumonia 2008.  Health history also lists CAD/MI but this is not reflected in her most recent cardiology notes which do not mention a history of MI and state she had normal coronaries by cath in 1986 and normal Myoview in May 2013.  PCP is Dr. Posey Rea who medically cleared patient for this procedure.  Cardiologist is Dr. Tenny Craw who felt patient was low risk for cardiac complication and "OK to proceed."    EKG on 01/18/12 showed NSR.  Nuclear stress test on 07/17/11 showed: Normal stress nuclear study. LV Ejection Fraction: 76%. LV Wall Motion: NL LV Function; NL Wall Motion.   Chest x-ray from 01/18/2012 showed no active cardiopulmonary disease.   Preoperative labs noted.   Anticipate she can proceed as planned.  Shonna Chock, PA-C

## 2012-01-22 MED ORDER — CEFAZOLIN SODIUM-DEXTROSE 2-3 GM-% IV SOLR
2.0000 g | INTRAVENOUS | Status: AC
  Administered 2012-01-23: 2 g via INTRAVENOUS
  Filled 2012-01-22: qty 50

## 2012-01-22 NOTE — Progress Notes (Signed)
Pt notified of time change--msg left for pt to arrive at [facility]

## 2012-01-22 NOTE — H&P (Signed)
  [facility] [address]   [address] [place], [place] [address] [phone] A Division of [facility]  Loreta Ave, M.D.   Robert A. Thurston Hole, M.D.   Burnell Blanks, M.D.   Eulas Post, M.D.   Lunette Stands, M.D Buford Dresser, M.D.  Charlsie Quest, M.D.   Estell Harpin, M.D.   Melina Fiddler, M.D. Genene Churn. Barry Dienes, PA-C            Kirstin A. Shepperson, PA-C Josh [place], PA-C [place], [place]   RE: [place], [place]   [id number]      DOB: 1934-07-27 PROGRESS NOTE: 01-15-12 77 year old female with left knee end stage degenerative joint disease and chronic pain returns. She wants to proceed with total knee replacement as scheduled. She'll need rehab placement post-op and has looked into [facility]. Current medications: Amlodipine, aspirin, Voltaren gel, Lexapro, Neurontin, Hydrocodone Toprol, Crestor, Restoril, Kenalog, Maxzide and vitamins. Allergies: Ibuprofen iodine and Simvastatin. Past medical/surgical history: pneumonia hyperlipidemia hypertension osteoarthritis coronary artery disease tonsillectomy hysterectomy depression/anxiety. Family history: unremarkable. Social history: she denies smoking or alcohol use. Review of systems: is significant for exertional dyspnea and palpations with increased activity with occasional dizziness. No fever chills nausea vomiting chest pain   EXAMINATION:. Height 5'3" weight 179 pounds. Blood pressure 132/70. Pulse 69. Alert and oriented x3. No acute distress. NO increase in respirations. PERRLA and EOMI. Neck unremarkable. Lungs CTA bilaterally. No wheezes noted. Heart regular rate and rhythm No murmurs. Abdomen round non-distended. NABS x4. Soft non-tender. Left knee decreased range of motion  positive crepitus joint line tenderness positive effusion ligaments table calf non-tender neurovascularly intact   Skin warm and dry.   X-RAYS: AP lateral sunrise views show end  stage degenerative joint disease with periarticular spurs.   IMPRESSION: Left knee end stage degenerative joint disease with chronic pain.  DISPOSITION: Proceed with left knee total knee replacement as scheduled.  All questions answered.  Loreta Ave, M.D.  Electronically verified by Loreta Ave, M.D. DFM(JMO):kh D 01-21-12 T 01-21-12

## 2012-01-22 NOTE — Progress Notes (Signed)
Notified Sherri at Dr. Greig Right office that we do not have orders.

## 2012-01-23 ENCOUNTER — Encounter (HOSPITAL_COMMUNITY)
Admission: RE | Disposition: A | Discharge status: Skilled Nursing Facility | Payer: Self-pay | Source: Ambulatory Visit | Attending: Orthopedic Surgery

## 2012-01-23 ENCOUNTER — Inpatient Hospital Stay (HOSPITAL_COMMUNITY): Payer: Managed Care, Other (non HMO) | Admitting: Vascular Surgery

## 2012-01-23 ENCOUNTER — Encounter (HOSPITAL_COMMUNITY): Payer: Self-pay | Admitting: Vascular Surgery

## 2012-01-23 ENCOUNTER — Inpatient Hospital Stay (HOSPITAL_COMMUNITY)
Admission: RE | Admit: 2012-01-23 | Discharge: 2012-01-25 | DRG: 470 | Disposition: A | Discharge status: Skilled Nursing Facility | Payer: Managed Care, Other (non HMO) | Source: Ambulatory Visit | Attending: Orthopedic Surgery | Admitting: Orthopedic Surgery

## 2012-01-23 ENCOUNTER — Ambulatory Visit: Payer: Managed Care, Other (non HMO) | Admitting: Internal Medicine

## 2012-01-23 ENCOUNTER — Inpatient Hospital Stay (HOSPITAL_COMMUNITY): Payer: Managed Care, Other (non HMO)

## 2012-01-23 DIAGNOSIS — F411 Generalized anxiety disorder: Secondary | ICD-10-CM | POA: Diagnosis present

## 2012-01-23 DIAGNOSIS — Z888 Allergy status to other drugs, medicaments and biological substances status: Secondary | ICD-10-CM

## 2012-01-23 DIAGNOSIS — Z79899 Other long term (current) drug therapy: Secondary | ICD-10-CM

## 2012-01-23 DIAGNOSIS — F3289 Other specified depressive episodes: Secondary | ICD-10-CM | POA: Diagnosis present

## 2012-01-23 DIAGNOSIS — Z96659 Presence of unspecified artificial knee joint: Secondary | ICD-10-CM

## 2012-01-23 DIAGNOSIS — Z9089 Acquired absence of other organs: Secondary | ICD-10-CM

## 2012-01-23 DIAGNOSIS — G8929 Other chronic pain: Secondary | ICD-10-CM | POA: Diagnosis present

## 2012-01-23 DIAGNOSIS — K219 Gastro-esophageal reflux disease without esophagitis: Secondary | ICD-10-CM | POA: Diagnosis present

## 2012-01-23 DIAGNOSIS — M1710 Unilateral primary osteoarthritis, unspecified knee: Principal | ICD-10-CM | POA: Diagnosis present

## 2012-01-23 DIAGNOSIS — E785 Hyperlipidemia, unspecified: Secondary | ICD-10-CM | POA: Diagnosis present

## 2012-01-23 DIAGNOSIS — I10 Essential (primary) hypertension: Secondary | ICD-10-CM | POA: Diagnosis present

## 2012-01-23 DIAGNOSIS — M25469 Effusion, unspecified knee: Secondary | ICD-10-CM | POA: Diagnosis present

## 2012-01-23 DIAGNOSIS — Z90710 Acquired absence of both cervix and uterus: Secondary | ICD-10-CM

## 2012-01-23 DIAGNOSIS — I252 Old myocardial infarction: Secondary | ICD-10-CM

## 2012-01-23 DIAGNOSIS — F329 Major depressive disorder, single episode, unspecified: Secondary | ICD-10-CM | POA: Diagnosis present

## 2012-01-23 DIAGNOSIS — I2510 Atherosclerotic heart disease of native coronary artery without angina pectoris: Secondary | ICD-10-CM | POA: Diagnosis present

## 2012-01-23 DIAGNOSIS — Z23 Encounter for immunization: Secondary | ICD-10-CM

## 2012-01-23 DIAGNOSIS — Z7982 Long term (current) use of aspirin: Secondary | ICD-10-CM

## 2012-01-23 HISTORY — PX: TOTAL KNEE ARTHROPLASTY: SHX125

## 2012-01-23 HISTORY — DX: Family history of other specified conditions: Z84.89

## 2012-01-23 HISTORY — DX: Shortness of breath: R06.02

## 2012-01-23 LAB — COMPREHENSIVE METABOLIC PANEL
Albumin: 3.6 g/dL (ref 3.5–5.2)
Alkaline Phosphatase: 84 U/L (ref 39–117)
BUN: 12 mg/dL (ref 6–23)
CO2: 27 mEq/L (ref 19–32)
Chloride: 105 mEq/L (ref 96–112)
Creatinine, Ser: 0.89 mg/dL (ref 0.50–1.10)
GFR calc non Af Amer: 62 mL/min — ABNORMAL LOW (ref >90)
Glucose, Bld: 118 mg/dL — ABNORMAL HIGH (ref 70–99)
Potassium: 3.2 mEq/L — ABNORMAL LOW (ref 3.5–5.1)
Total Bilirubin: 0.4 mg/dL (ref 0.3–1.2)

## 2012-01-23 SURGERY — ARTHROPLASTY, KNEE, TOTAL
Anesthesia: General | Site: Knee | Laterality: Left | Wound class: Clean

## 2012-01-23 MED ORDER — GLYCOPYRROLATE 0.2 MG/ML IJ SOLN
INTRAMUSCULAR | Status: DC | PRN
  Administered 2012-01-23: 0.2 mg via INTRAVENOUS

## 2012-01-23 MED ORDER — FENTANYL CITRATE 0.05 MG/ML IJ SOLN
INTRAMUSCULAR | Status: DC | PRN
  Administered 2012-01-23 (×3): 50 ug via INTRAVENOUS

## 2012-01-23 MED ORDER — BUPIVACAINE HCL (PF) 0.25 % IJ SOLN
INTRAMUSCULAR | Status: DC | PRN
  Administered 2012-01-23: 30 mL

## 2012-01-23 MED ORDER — MENTHOL 3 MG MT LOZG: 1.0000 | LOZENGE | OROMUCOSAL | Status: DC | PRN

## 2012-01-23 MED ORDER — METHOCARBAMOL 500 MG PO TABS
500.0000 mg | ORAL_TABLET | Freq: Four times a day (QID) | ORAL | Status: DC | PRN
  Administered 2012-01-24: 500 mg via ORAL
  Filled 2012-01-23 (×2): qty 1

## 2012-01-23 MED ORDER — AMLODIPINE BESYLATE 5 MG PO TABS
5.0000 mg | ORAL_TABLET | Freq: Every day | ORAL | Status: DC
  Administered 2012-01-23 – 2012-01-25 (×3): 5 mg via ORAL
  Filled 2012-01-23 (×3): qty 1

## 2012-01-23 MED ORDER — ONDANSETRON HCL 4 MG PO TABS: 4.0000 mg | ORAL_TABLET | Freq: Four times a day (QID) | ORAL | Status: DC | PRN

## 2012-01-23 MED ORDER — PANTOPRAZOLE SODIUM 40 MG PO TBEC
40.0000 mg | DELAYED_RELEASE_TABLET | Freq: Every day | ORAL | Status: DC
  Administered 2012-01-24 – 2012-01-25 (×2): 40 mg via ORAL
  Filled 2012-01-23 (×2): qty 1

## 2012-01-23 MED ORDER — METHOCARBAMOL 100 MG/ML IJ SOLN: 500.0000 mg | Freq: Four times a day (QID) | INTRAVENOUS | Status: DC | PRN

## 2012-01-23 MED ORDER — GABAPENTIN 100 MG PO CAPS
100.0000 mg | ORAL_CAPSULE | Freq: Every day | ORAL | Status: DC
  Administered 2012-01-24 – 2012-01-25 (×2): 100 mg via ORAL
  Filled 2012-01-23 (×2): qty 1

## 2012-01-23 MED ORDER — ALUM & MAG HYDROXIDE-SIMETH 200-200-20 MG/5ML PO SUSP: 30.0000 mL | ORAL | Status: DC | PRN

## 2012-01-23 MED ORDER — COUMADIN BOOK
Freq: Once | Status: AC
  Administered 2012-01-23: 13:00:00
  Filled 2012-01-23: qty 1

## 2012-01-23 MED ORDER — BUPIVACAINE HCL (PF) 0.25 % IJ SOLN
INTRAMUSCULAR | Status: AC
  Filled 2012-01-23: qty 30

## 2012-01-23 MED ORDER — WHITE PETROLATUM GEL
Status: AC
  Administered 2012-01-23: 13:00:00
  Filled 2012-01-23: qty 5

## 2012-01-23 MED ORDER — ACETAMINOPHEN 650 MG RE SUPP: 650.0000 mg | Freq: Four times a day (QID) | RECTAL | Status: DC | PRN

## 2012-01-23 MED ORDER — POTASSIUM CHLORIDE IN NACL 20-0.9 MEQ/L-% IV SOLN
INTRAVENOUS | Status: DC
  Filled 2012-01-23 (×7): qty 1000

## 2012-01-23 MED ORDER — BISACODYL 10 MG RE SUPP: 10.0000 mg | Freq: Every day | RECTAL | Status: DC | PRN

## 2012-01-23 MED ORDER — TRIAMTERENE-HCTZ 37.5-25 MG PO TABS
1.0000 | ORAL_TABLET | Freq: Every day | ORAL | Status: DC
  Administered 2012-01-23 – 2012-01-25 (×3): 1 via ORAL
  Filled 2012-01-23 (×3): qty 1

## 2012-01-23 MED ORDER — SODIUM CHLORIDE 0.9 % IR SOLN
Status: DC | PRN
  Administered 2012-01-23: 3000 mL

## 2012-01-23 MED ORDER — HYDROMORPHONE HCL PF 1 MG/ML IJ SOLN: 0.2500 mg | INTRAMUSCULAR | Status: DC | PRN

## 2012-01-23 MED ORDER — DOCUSATE SODIUM 100 MG PO CAPS
100.0000 mg | ORAL_CAPSULE | Freq: Two times a day (BID) | ORAL | Status: DC
  Administered 2012-01-23 – 2012-01-25 (×4): 100 mg via ORAL
  Filled 2012-01-23 (×4): qty 1

## 2012-01-23 MED ORDER — PNEUMOCOCCAL VAC POLYVALENT 25 MCG/0.5ML IJ INJ
0.5000 mL | INJECTION | INTRAMUSCULAR | Status: AC
  Administered 2012-01-24: 0.5 mL via INTRAMUSCULAR
  Filled 2012-01-23: qty 0.5

## 2012-01-23 MED ORDER — ACETAMINOPHEN 325 MG PO TABS: 650.0000 mg | ORAL_TABLET | Freq: Four times a day (QID) | ORAL | Status: DC | PRN

## 2012-01-23 MED ORDER — SENNOSIDES-DOCUSATE SODIUM 8.6-50 MG PO TABS: 1.0000 | ORAL_TABLET | Freq: Every evening | ORAL | Status: DC | PRN

## 2012-01-23 MED ORDER — METOCLOPRAMIDE HCL 10 MG PO TABS: 5.0000 mg | ORAL_TABLET | Freq: Three times a day (TID) | ORAL | Status: DC | PRN

## 2012-01-23 MED ORDER — HYDROCODONE-ACETAMINOPHEN 10-325 MG PO TABS
1.0000 | ORAL_TABLET | ORAL | Status: DC | PRN
  Administered 2012-01-24 (×2): 2 via ORAL
  Filled 2012-01-23 (×2): qty 2

## 2012-01-23 MED ORDER — LACTATED RINGERS IV SOLN
INTRAVENOUS | Status: DC | PRN
  Administered 2012-01-23 (×2): via INTRAVENOUS

## 2012-01-23 MED ORDER — ONDANSETRON HCL 4 MG/2ML IJ SOLN: 4.0000 mg | Freq: Four times a day (QID) | INTRAMUSCULAR | Status: DC | PRN

## 2012-01-23 MED ORDER — TEMAZEPAM 15 MG PO CAPS
30.0000 mg | ORAL_CAPSULE | Freq: Every day | ORAL | Status: DC
  Administered 2012-01-23 – 2012-01-24 (×2): 30 mg via ORAL
  Filled 2012-01-23: qty 2
  Filled 2012-01-23 (×2): qty 1

## 2012-01-23 MED ORDER — WARFARIN - PHARMACIST DOSING INPATIENT: Freq: Every day | Status: DC

## 2012-01-23 MED ORDER — MORPHINE SULFATE 4 MG/ML IJ SOLN
INTRAMUSCULAR | Status: DC | PRN
  Administered 2012-01-23: 4 mg

## 2012-01-23 MED ORDER — CEFAZOLIN SODIUM 1-5 GM-% IV SOLN
1.0000 g | Freq: Three times a day (TID) | INTRAVENOUS | Status: AC
  Administered 2012-01-23 (×2): 1 g via INTRAVENOUS
  Filled 2012-01-23 (×3): qty 50

## 2012-01-23 MED ORDER — WARFARIN VIDEO: Freq: Once | Status: DC

## 2012-01-23 MED ORDER — ATORVASTATIN CALCIUM 20 MG PO TABS
20.0000 mg | ORAL_TABLET | Freq: Every day | ORAL | Status: DC
  Administered 2012-01-23 – 2012-01-24 (×2): 20 mg via ORAL
  Filled 2012-01-23 (×3): qty 1

## 2012-01-23 MED ORDER — ENOXAPARIN SODIUM 30 MG/0.3ML ~~LOC~~ SOLN
30.0000 mg | Freq: Two times a day (BID) | SUBCUTANEOUS | Status: DC
  Administered 2012-01-24 – 2012-01-25 (×3): 30 mg via SUBCUTANEOUS
  Filled 2012-01-23 (×6): qty 0.3

## 2012-01-23 MED ORDER — ESCITALOPRAM OXALATE 10 MG PO TABS
10.0000 mg | ORAL_TABLET | Freq: Every day | ORAL | Status: DC
  Administered 2012-01-24 – 2012-01-25 (×2): 10 mg via ORAL
  Filled 2012-01-23 (×2): qty 1

## 2012-01-23 MED ORDER — METOPROLOL SUCCINATE ER 50 MG PO TB24
50.0000 mg | ORAL_TABLET | Freq: Every day | ORAL | Status: DC
  Administered 2012-01-23 – 2012-01-25 (×2): 50 mg via ORAL
  Filled 2012-01-23 (×3): qty 1

## 2012-01-23 MED ORDER — PROPOFOL 10 MG/ML IV BOLUS
INTRAVENOUS | Status: DC | PRN
  Administered 2012-01-23: 130 mg via INTRAVENOUS

## 2012-01-23 MED ORDER — MORPHINE SULFATE 4 MG/ML IJ SOLN
INTRAMUSCULAR | Status: AC
  Filled 2012-01-23: qty 1

## 2012-01-23 MED ORDER — ONDANSETRON HCL 4 MG/2ML IJ SOLN: 4.0000 mg | Freq: Once | INTRAMUSCULAR | Status: DC | PRN

## 2012-01-23 MED ORDER — LIDOCAINE HCL (CARDIAC) 20 MG/ML IV SOLN
INTRAVENOUS | Status: DC | PRN
  Administered 2012-01-23: 60 mg via INTRAVENOUS

## 2012-01-23 MED ORDER — METOCLOPRAMIDE HCL 5 MG/ML IJ SOLN: 5.0000 mg | Freq: Three times a day (TID) | INTRAMUSCULAR | Status: DC | PRN

## 2012-01-23 MED ORDER — PHENOL 1.4 % MT LIQD
1.0000 | OROMUCOSAL | Status: DC | PRN
  Administered 2012-01-24: 1 via OROMUCOSAL
  Filled 2012-01-23: qty 177

## 2012-01-23 MED ORDER — BUPIVACAINE-EPINEPHRINE PF 0.5-1:200000 % IJ SOLN
INTRAMUSCULAR | Status: DC | PRN
  Administered 2012-01-23: 25 mL

## 2012-01-23 MED ORDER — WARFARIN SODIUM 5 MG PO TABS
5.0000 mg | ORAL_TABLET | Freq: Once | ORAL | Status: AC
  Administered 2012-01-23: 5 mg via ORAL
  Filled 2012-01-23 (×2): qty 1

## 2012-01-23 MED ORDER — HYDROMORPHONE HCL PF 1 MG/ML IJ SOLN
1.0000 mg | INTRAMUSCULAR | Status: DC | PRN
  Administered 2012-01-23 – 2012-01-24 (×3): 1 mg via INTRAVENOUS
  Filled 2012-01-23 (×3): qty 1

## 2012-01-23 SURGICAL SUPPLY — 61 items
BANDAGE ESMARK 6X9 LF (GAUZE/BANDAGES/DRESSINGS) ×1 IMPLANT
BLADE SAG 18X100X1.27 (BLADE) ×4 IMPLANT
BNDG CMPR 9X6 STRL LF SNTH (GAUZE/BANDAGES/DRESSINGS) ×1
BNDG ESMARK 6X9 LF (GAUZE/BANDAGES/DRESSINGS) ×2
BOOTCOVER CLEANROOM LRG (PROTECTIVE WEAR) ×4 IMPLANT
BOWL SMART MIX CTS (DISPOSABLE) ×2 IMPLANT
CEMENT BONE SIMPLEX SPEEDSET (Cement) ×4 IMPLANT
CHLORAPREP W/TINT 26ML (MISCELLANEOUS) ×2 IMPLANT
CLOTH BEACON ORANGE TIMEOUT ST (SAFETY) ×2 IMPLANT
COVER BACK TABLE 24X17X13 BIG (DRAPES) ×2 IMPLANT
COVER SURGICAL LIGHT HANDLE (MISCELLANEOUS) ×2 IMPLANT
CUFF TOURNIQUET SINGLE 34IN LL (TOURNIQUET CUFF) ×2 IMPLANT
DRAPE EXTREMITY T 121X128X90 (DRAPE) ×2 IMPLANT
DRAPE INCISE 23X17 IOBAN STRL (DRAPES) ×1
DRAPE INCISE 23X17 STRL (DRAPES) IMPLANT
DRAPE INCISE IOBAN 23X17 STRL (DRAPES) ×1 IMPLANT
DRAPE PROXIMA HALF (DRAPES) ×2 IMPLANT
DRAPE U-SHAPE 47X51 STRL (DRAPES) ×2 IMPLANT
DRSG PAD ABDOMINAL 8X10 ST (GAUZE/BANDAGES/DRESSINGS) ×2 IMPLANT
DURAPREP 26ML APPLICATOR (WOUND CARE) ×1 IMPLANT
ELECT CAUTERY BLADE 6.4 (BLADE) ×2 IMPLANT
ELECT REM PT RETURN 9FT ADLT (ELECTROSURGICAL) ×2
ELECTRODE REM PT RTRN 9FT ADLT (ELECTROSURGICAL) ×1 IMPLANT
EVACUATOR 1/8 PVC DRAIN (DRAIN) ×2 IMPLANT
FACESHIELD LNG OPTICON STERILE (SAFETY) ×2 IMPLANT
GAUZE XEROFORM 5X9 LF (GAUZE/BANDAGES/DRESSINGS) ×2 IMPLANT
GLOVE BIOGEL PI IND STRL 8 (GLOVE) ×1 IMPLANT
GLOVE BIOGEL PI INDICATOR 8 (GLOVE) ×1
GLOVE ORTHO TXT STRL SZ7.5 (GLOVE) ×3 IMPLANT
GOWN PREVENTION PLUS XLARGE (GOWN DISPOSABLE) ×4 IMPLANT
GOWN STRL NON-REIN LRG LVL3 (GOWN DISPOSABLE) ×3 IMPLANT
GOWN STRL REIN 2XL XLG LVL4 (GOWN DISPOSABLE) ×2 IMPLANT
HANDPIECE INTERPULSE COAX TIP (DISPOSABLE) ×2
IMMOBILIZER KNEE 22 UNIV (SOFTGOODS) ×2 IMPLANT
IMMOBILIZER KNEE 24 THIGH 36 (MISCELLANEOUS) IMPLANT
IMMOBILIZER KNEE 24 UNIV (MISCELLANEOUS)
KIT BASIN OR (CUSTOM PROCEDURE TRAY) ×2 IMPLANT
KIT ROOM TURNOVER OR (KITS) ×2 IMPLANT
MANIFOLD NEPTUNE II (INSTRUMENTS) ×2 IMPLANT
NS IRRIG 1000ML POUR BTL (IV SOLUTION) ×1 IMPLANT
PACK TOTAL JOINT (CUSTOM PROCEDURE TRAY) ×2 IMPLANT
PAD ARMBOARD 7.5X6 YLW CONV (MISCELLANEOUS) ×4 IMPLANT
PAD CAST 4YDX4 CTTN HI CHSV (CAST SUPPLIES) ×1 IMPLANT
PADDING CAST COTTON 4X4 STRL (CAST SUPPLIES) ×2
PADDING CAST COTTON 6X4 STRL (CAST SUPPLIES) ×2 IMPLANT
RUBBERBAND STERILE (MISCELLANEOUS) ×2 IMPLANT
SET HNDPC FAN SPRY TIP SCT (DISPOSABLE) ×1 IMPLANT
SPONGE GAUZE 4X4 12PLY (GAUZE/BANDAGES/DRESSINGS) ×2 IMPLANT
STAPLER VISISTAT 35W (STAPLE) ×2 IMPLANT
STEM CEMENTED (Stem) ×1 IMPLANT
SUCTION FRAZIER TIP 10 FR DISP (SUCTIONS) ×2 IMPLANT
SUT VIC AB 1 CTX 36 (SUTURE) ×4
SUT VIC AB 1 CTX36XBRD ANBCTR (SUTURE) ×2 IMPLANT
SUT VIC AB 2-0 CT1 27 (SUTURE) ×4
SUT VIC AB 2-0 CT1 TAPERPNT 27 (SUTURE) ×2 IMPLANT
SYR 30ML LL (SYRINGE) ×2 IMPLANT
SYR 30ML SLIP (SYRINGE) ×2 IMPLANT
TOWEL OR 17X24 6PK STRL BLUE (TOWEL DISPOSABLE) ×2 IMPLANT
TOWEL OR 17X26 10 PK STRL BLUE (TOWEL DISPOSABLE) ×2 IMPLANT
TRAY FOLEY CATH 14FR (SET/KITS/TRAYS/PACK) ×2 IMPLANT
WATER STERILE IRR 1000ML POUR (IV SOLUTION) ×5 IMPLANT

## 2012-01-23 NOTE — Anesthesia Postprocedure Evaluation (Signed)
  Anesthesia Post-op Note  Patient: Terri King  Procedure(s) Performed: Procedure(s) (LRB) with comments: TOTAL KNEE ARTHROPLASTY (Left)  Patient Location: PACU  Anesthesia Type:GA combined with regional for post-op pain  Level of Consciousness: awake, oriented, sedated and patient cooperative  Airway and Oxygen Therapy: Patient Spontanous Breathing  Post-op Pain: mild  Post-op Assessment: Post-op Vital signs reviewed, Patient's Cardiovascular Status Stable, Respiratory Function Stable, Patent Airway, No signs of Nausea or vomiting and Pain level controlled  Post-op Vital Signs: stable  Complications: No apparent anesthesia complications

## 2012-01-23 NOTE — Evaluation (Signed)
Physical Therapy Evaluation Patient Details Name: Terri King MRN: [id number] DOB: 27-Aug-1934 Today's Date: 01/23/2012 Time: 9147-8295 PT Time Calculation (min): 53 min  PT Assessment / Plan / Recommendation Clinical Impression  Pt is a77 y/o female s/p L TKA. Pt will benefit from PT in acute settting in preparation for d/c to short term SNF for intensive rehab.      PT Assessment  Patient needs continued PT services    Follow Up Recommendations  SNF;Supervision/Assistance - 24 hour    Does the patient have the potential to tolerate intense rehabilitation      Barriers to Discharge Decreased caregiver support;Inaccessible home environment 3 level home and pt lives alone.     Equipment Recommendations  Rolling walker with 5" wheels;3 in 1 bedside comode    Recommendations for Other Services     Frequency 7X/week    Precautions / Restrictions Precautions Precautions: Knee Restrictions Weight Bearing Restrictions: Yes   Pertinent Vitals/Pain Pt c/o 8/10 pain in L knee. Notified RN of pt's pain.       Mobility  Bed Mobility Bed Mobility: Supine to Sit;Sitting - Scoot to Edge of Bed Supine to Sit: 3: Mod assist;HOB flat Sitting - Scoot to Edge of Bed: 3: Mod assist Details for Bed Mobility Assistance: cues for technique, assist for L LE assist to raise shoulders from bed.   Transfers Transfers: Sit to Stand;Stand to Sit;Stand Pivot Transfers Sit to Stand: 3: Mod assist;From bed Stand to Sit: 3: Mod assist;To chair/3-in-1 Stand Pivot Transfers: 2: Max assist Details for Transfer Assistance: cues for hand and L LE placement assist to manage pt's body weight, assist for controlled descent to chair, Assist to manage walker.  Step by ste cueing for stand-pivot sequencing and assist to maintain balance,  Ambulation/Gait Ambulation/Gait Assistance: Not tested (comment)    Shoulder Instructions     Exercises Total Joint Exercises Ankle Circles/Pumps: Both;10 reps   PT  Diagnosis: Difficulty walking;Generalized weakness;Acute pain  PT Problem List: Decreased strength;Decreased range of motion;Decreased activity tolerance;Decreased balance;Decreased mobility;Decreased knowledge of use of DME;Decreased knowledge of precautions;Pain PT Treatment Interventions: Gait training;DME instruction;Functional mobility training;Therapeutic activities;Therapeutic exercise;Patient/family education   PT Goals Acute Rehab PT Goals PT Goal Formulation: With patient Time For Goal Achievement: 02/06/12 Potential to Achieve Goals: Good Pt will go Supine/Side to Sit: with supervision PT Goal: Supine/Side to Sit - Progress: Goal set today Pt will go Sit to Supine/Side: with supervision PT Goal: Sit to Supine/Side - Progress: Goal set today Pt will go Sit to Stand: with supervision PT Goal: Sit to Stand - Progress: Goal set today Pt will go Stand to Sit: with supervision PT Goal: Stand to Sit - Progress: Goal set today Pt will Transfer Bed to Chair/Chair to Bed: with supervision PT Transfer Goal: Bed to Chair/Chair to Bed - Progress: Goal set today Pt will Ambulate: 51 - 150 feet;with supervision PT Goal: Ambulate - Progress: Goal set today Pt will Perform Home Exercise Program: with supervision, verbal cues required/provided PT Goal: Perform Home Exercise Program - Progress: Goal set today  Visit Information  Last PT Received On: 01/23/12    Subjective Data  Subjective: Agree to PT eval  Patient Stated Goal: walk without pain   Prior Functioning  Home Living Lives With: Alone Available Help at Discharge: Skilled Nursing Facility Type of Home: House Home Access: Level entry Home Layout: Multi-level Prior Function Level of Independence: Independent with assistive device(s) Able to Take Stairs?: Yes Driving: Yes 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 Communication Communication: No difficulties Dominant Hand: Left    Cognition   Overall Cognitive Status: Appears within functional limits for tasks assessed/performed Arousal/Alertness: Awake/alert Orientation Level: Appears intact for tasks assessed Behavior During Session: [facility] for tasks performed    Extremity/Trunk Assessment Right Lower Extremity Assessment RLE ROM/Strength/Tone: Within functional levels Left Lower Extremity Assessment LLE ROM/Strength/Tone: Unable to fully assess;Due to pain   Balance Balance Balance Assessed: Yes Static Sitting Balance Static Sitting - Balance Support: Feet supported Static Sitting - Level of Assistance: 5: Stand by assistance Static Sitting - Comment/# of Minutes: Pt c/o dizziness but no lob noted.  Static Standing Balance Static Standing - Balance Support: Bilateral upper extremity supported Static Standing - Level of Assistance: 3: Mod assist Static Standing - Comment/# of Minutes: Pt presents with posterior lean in standing. Lost balance during stand pivot transfer. Pt unable to regain without max assist from PT.   End of Session PT - End of Session Equipment Utilized During Treatment: Gait belt;Left knee immobilizer Activity Tolerance: Patient limited by fatigue;Patient limited by pain Patient left: in chair;with family/visitor present;with call bell/phone within reach Nurse Communication: Mobility status;Weight bearing status CPM Right Knee CPM Right Knee: Off Additional Comments: CPM off at 5 pm.  GP     Solomiya Pascale 01/23/2012, 6:39 PM  Makaylah Oddo L. Camyah Pultz DPT [phone]

## 2012-01-23 NOTE — Progress Notes (Signed)
Orthopedic Tech Progress Note Patient Details:  Terri King January 11, 1935 [id number]  CPM Right Knee CPM Right Knee: On Right Knee Flexion (Degrees): 60  Right Knee Extension (Degrees): 0  Additional Comments: trapeze bar   Cammer, Mickie Bail 01/23/2012, 12:21 PM

## 2012-01-23 NOTE — Anesthesia Preprocedure Evaluation (Addendum)
Anesthesia Evaluation  Patient identified by MRN, date of birth, ID band Patient awake    Reviewed: Allergy & Precautions, H&P , NPO status , Patient's Chart, lab work & pertinent test results, reviewed documented beta blocker date and time   History of Anesthesia Complications Negative for: history of anesthetic complications  Airway Mallampati: II TM Distance: >3 FB Neck ROM: Full    Dental  (+) Edentulous Upper   Pulmonary          Cardiovascular hypertension, Pt. on medications and Pt. on home beta blockers + CAD and + Past MI     Neuro/Psych PSYCHIATRIC DISORDERS Depression  Neuromuscular disease    GI/Hepatic GERD-  ,  Endo/Other    Renal/GU      Musculoskeletal   Abdominal   Peds  Hematology   Anesthesia Other Findings   Reproductive/Obstetrics                          Anesthesia Physical Anesthesia Plan  ASA: II  Anesthesia Plan: General   Post-op Pain Management: MAC Combined w/ Regional for Post-op pain   Induction: Intravenous  Airway Management Planned: LMA  Additional Equipment:   Intra-op Plan:   Post-operative Plan:   Informed Consent: I have reviewed the patients History and Physical, chart, labs and discussed the procedure including the risks, benefits and alternatives for the proposed anesthesia with the patient or authorized representative who has indicated his/her understanding and acceptance.   Dental advisory given  Plan Discussed with: Anesthesiologist and Surgeon  Anesthesia Plan Comments:        Anesthesia Quick Evaluation

## 2012-01-23 NOTE — Transfer of Care (Signed)
Immediate Anesthesia Transfer of Care Note  Patient: Terri King  Procedure(s) Performed: Procedure(s) (LRB) with comments: TOTAL KNEE ARTHROPLASTY (Left)  Patient Location: PACU  Anesthesia Type:General and Regional  Level of Consciousness: awake, alert  and oriented  Airway & Oxygen Therapy: Patient Spontanous Breathing  Post-op Assessment: Report given to PACU RN and Post -op Vital signs reviewed and stable  Post vital signs: Reviewed and stable  Complications: No apparent anesthesia complications

## 2012-01-23 NOTE — Interval H&P Note (Signed)
History and Physical Interval Note:  01/23/2012 8:15 AM  Terri King  has presented today for surgery, with the diagnosis of DJD LEFT KNEE  The various methods of treatment have been discussed with the patient and family. After consideration of risks, benefits and other options for treatment, the patient has consented to  Procedure(s) (LRB) with comments: TOTAL KNEE ARTHROPLASTY (Left) as a surgical intervention .  The patient's history has been reviewed, patient examined, no change in status, stable for surgery.  I have reviewed the patient's chart and labs.  Questions were answered to the patient's satisfaction.     [name] F

## 2012-01-23 NOTE — Brief Op Note (Signed)
01/23/2012  2:09 PM  PATIENT:  Terri King  77 y.o. female  PRE-OPERATIVE DIAGNOSIS:  DJD LEFT KNEE  POST-OPERATIVE DIAGNOSIS:  DJD LEFT KNEE  PROCEDURE:  Procedure(s) (LRB) with comments: TOTAL KNEE ARTHROPLASTY (Left)  SURGEON:  Surgeon(s) and Role:    * Loreta Ave, MD - Primary  PHYSICIAN ASSISTANT:  Zonia Kief M      ANESTHESIA:   regional and general  EBL:  Total I/O In: 1050 [I.V.:1050] Out: -    SPECIMEN:  No Specimen  DISPOSITION OF SPECIMEN:  N/A  COUNTS:  YES  TOURNIQUET:   Total Tourniquet Time Documented: Thigh (Left) - 183 minutes   PATIENT DISPOSITION:  PACU - hemodynamically stable.

## 2012-01-23 NOTE — Progress Notes (Signed)
ANTICOAGULATION CONSULT NOTE - Initial Consult  Pharmacy Consult for Coumadin Indication: VTE prophylaxis  Allergies  Allergen Reactions  . Codeine Other (See Comments)    Abnormal behavior  . Gabapentin     REACTION: nausea  . Ibuprofen Nausea And Vomiting  . Simvastatin     REACTION: achy  . Iodine Swelling and Rash    Vital Signs: Temp: 97.8 F (36.6 C) (12/04 1240) Temp src: Oral (12/04 0752) BP: 182/64 mmHg (12/04 1242) Pulse Rate: 71  (12/04 1240)  Labs:  [facility] 01/23/12 0806  HGB --  HCT --  PLT --  APTT --  LABPROT --  INR --  HEPARINUNFRC --  CREATININE 0.89  CKTOTAL --  CKMB --  TROPONINI --    The CrCl is unknown because both a height and weight (above a minimum accepted value) are required for this calculation.   Medical History: Past Medical History  Diagnosis Date  . History of pneumonia 2008  . Hyperlipemia   . Osteoarthritis   . MI (myocardial infarction)     DR Tenny Craw  . HTN (hypertension)     dr Posey Rea  . CAD (coronary artery disease)     DR Tenny Craw; by notes normal cath in 1986, normal stress 2013    Medications:  Prescriptions prior to admission  Medication Sig Dispense Refill  . amLODipine (NORVASC) 5 MG tablet Take 5 mg by mouth daily.      . Ascorbic Acid (VITAMIN C) 100 MG tablet Take 100 mg by mouth daily.      Marland Kitchen aspirin 81 MG EC tablet Take 81 mg by mouth daily.        . cholecalciferol (VITAMIN D) 400 UNITS TABS Take 400 Units by mouth daily.      Marland Kitchen dexlansoprazole (DEXILANT) 60 MG capsule Take 60 mg by mouth daily.      . diclofenac sodium (VOLTAREN) 1 % GEL Apply 1 application topically 4 (four) times daily.      Marland Kitchen escitalopram (LEXAPRO) 10 MG tablet Take 10 mg by mouth daily.      Marland Kitchen gabapentin (NEURONTIN) 100 MG capsule Take 100 mg by mouth daily.      Marland Kitchen HYDROcodone-acetaminophen (NORCO/VICODIN) 5-325 MG per tablet Take 1 tablet by mouth 4 (four) times daily as needed. For pain      . metoprolol succinate (TOPROL-XL)  50 MG 24 hr tablet Take 50 mg by mouth at bedtime. Take at hs      . rosuvastatin (CRESTOR) 5 MG tablet Take 5 mg by mouth 2 (two) times a week.      . temazepam (RESTORIL) 15 MG capsule Take 30 mg by mouth at bedtime.      . triamterene-hydrochlorothiazide (MAXZIDE-25) 37.5-25 MG per tablet Take 1 tablet by mouth daily.      . vitamin B-12 (CYANOCOBALAMIN) 100 MCG tablet Take 100 mcg by mouth daily.      Marland Kitchen triamcinolone cream (KENALOG) 0.5 % Apply 1 application topically daily as needed. rash        Assessment: 77 y/o female patient admitted with left DJD, s/p L TKA requiring coumadin for DVT px. Baseline INR wnl. No drug interactions identified. Lovenox initiated.  Goal of Therapy:  INR 2-3 Monitor platelets by anticoagulation protocol: Yes   Plan:  Coumadin 5mg  today, continue lovenox until INR>2 and f/u daily protime.  Verlene Mayer, PharmD, BCPS Pager [phone] 01/23/2012,1:09 PM

## 2012-01-23 NOTE — Anesthesia Procedure Notes (Addendum)
Anesthesia Regional Block:  Femoral nerve block  Pre-Anesthetic Checklist: ,, timeout performed, Correct Patient, Correct Site, Correct Laterality, Correct Procedure, Correct Position, site marked, Risks and benefits discussed,  Surgical consent,  Pre-op evaluation,  At surgeon's request and post-op pain management  Laterality: Left  Prep: Maximum Sterile Barrier Precautions used, chloraprep and alcohol swabs       Needles:  Injection technique: Single-shot  Needle Type: Stimulator Needle - 80          Additional Needles:  Procedures: nerve stimulator Femoral nerve block  Nerve Stimulator or Paresthesia:  Response: 0.5 mA, 0.1 ms, 6 cm  Additional Responses:   Narrative:  Start time: 01/23/2012 9:20 AM End time: 01/23/2012 9:25 AM Injection made incrementally with aspirations every 5 mL.  Performed by: Personally  Anesthesiologist: Maren Beach MD  Additional Notes: Pt accepts procedure and risks. 25 cc 0.5% Marcaine w/ epi w/o difficulty or discomfort . Pt tolerated well. Feliberto Gottron MD   Procedure Name: LMA Insertion Date/Time: 01/23/2012 10:03 AM Performed by: Sheppard Evens Pre-anesthesia Checklist: Patient identified, Emergency Drugs available, Suction available, Patient being monitored and Timeout performed Patient Re-evaluated:Patient Re-evaluated prior to inductionOxygen Delivery Method: Circle system utilized Preoxygenation: Pre-oxygenation with 100% oxygen Intubation Type: IV induction LMA: LMA inserted LMA Size: 4.0 Number of attempts: 1 Placement Confirmation: positive ETCO2 and breath sounds checked- equal and bilateral Tube secured with: Tape Dental Injury: Teeth and Oropharynx as per pre-operative assessment

## 2012-01-24 ENCOUNTER — Encounter (HOSPITAL_COMMUNITY): Payer: Self-pay | Admitting: Orthopedic Surgery

## 2012-01-24 LAB — CBC
MCH: 31.7 pg (ref 26.0–34.0)
MCHC: 35.4 g/dL (ref 30.0–36.0)
MCV: 89.7 fL (ref 78.0–100.0)
Platelets: 192 10*3/uL (ref 150–400)
RBC: 3.12 MIL/uL — ABNORMAL LOW (ref 3.87–5.11)

## 2012-01-24 LAB — BASIC METABOLIC PANEL
CO2: 24 mEq/L (ref 19–32)
Calcium: 8.4 mg/dL (ref 8.4–10.5)
Creatinine, Ser: 0.75 mg/dL (ref 0.50–1.10)
GFR calc non Af Amer: 81 mL/min — ABNORMAL LOW (ref >90)
Glucose, Bld: 124 mg/dL — ABNORMAL HIGH (ref 70–99)

## 2012-01-24 LAB — PROTIME-INR: Prothrombin Time: 14.2 seconds (ref 11.6–15.2)

## 2012-01-24 MED ORDER — POTASSIUM CHLORIDE 20 MEQ PO PACK
40.0000 meq | PACK | Freq: Once | ORAL | Status: DC
  Filled 2012-01-24: qty 2

## 2012-01-24 MED ORDER — OXYCODONE HCL 5 MG PO TABS
10.0000 mg | ORAL_TABLET | ORAL | Status: DC | PRN
  Administered 2012-01-24 – 2012-01-25 (×2): 10 mg via ORAL
  Filled 2012-01-24 (×2): qty 2

## 2012-01-24 MED ORDER — POTASSIUM CHLORIDE CRYS ER 20 MEQ PO TBCR
40.0000 meq | EXTENDED_RELEASE_TABLET | Freq: Once | ORAL | Status: AC
  Administered 2012-01-24: 40 meq via ORAL
  Filled 2012-01-24: qty 2

## 2012-01-24 MED ORDER — WARFARIN SODIUM 5 MG PO TABS
5.0000 mg | ORAL_TABLET | Freq: Once | ORAL | Status: AC
  Administered 2012-01-24: 5 mg via ORAL
  Filled 2012-01-24: qty 1

## 2012-01-24 NOTE — Progress Notes (Signed)
Subjective: Doing ok.  C/o left sided back pain.  Knee pain controlled.   Objective: Vital signs in last 24 hours: Temp:  [97.8 F (36.6 C)-99.3 F (37.4 C)] 98.8 F (37.1 C) (12/05 0628) Pulse Rate:  [68-81] 79  (12/05 0628) Resp:  [13-18] 18  (12/05 0628) BP: (115-189)/(44-81) 117/44 mmHg (12/05 0628) SpO2:  [95 %-100 %] 100 % (12/05 0628)  Intake/Output from previous day: 12/04 0701 - 12/05 0700 In: 1530 [P.O.:480; I.V.:1050] Out: 1100 [Urine:800; Drains:300] Intake/Output this shift:     Basename 01/24/12 0600  HGB 9.9*    Basename 01/24/12 0600  WBC 9.6  RBC 3.12*  HCT 28.0*  PLT 192    Basename 01/24/12 0600 01/23/12 0806  NA 134* 140  K 3.2* 3.2*  CL 100 105  CO2 24 27  BUN 10 12  CREATININE 0.75 0.89  GLUCOSE 124* 118*  CALCIUM 8.4 9.3    Basename 01/24/12 0600  LABPT --  INR 1.11    Exam:  Dressing c/d/i.  Calf nt.  Nvi.    Assessment/Plan: Transfer to snf Friday or Saturday.  D/c dilaudid.     Shacora Zynda M 01/24/2012, 11:45 AM

## 2012-01-24 NOTE — Evaluation (Signed)
Occupational Therapy Evaluation Patient Details Name: Terri King MRN: [id number] DOB: [birth year] Today's Date: 01/24/2012 Time: 3474-2595 OT Time Calculation (min): 34 min  OT Assessment / Plan / Recommendation Clinical Impression  Pt admitted for L TKA with deficits listed below.  Pt lives alone in 3 level home and will need continuted OT to increase I with basic adls and adl mobilty so she can safely return to 3 level home w/o assist.    OT Assessment  Patient needs continued OT Services    Follow Up Recommendations  SNF    Barriers to Discharge Decreased caregiver support Pt lives alone in 3 level home.  Equipment Recommendations  Rolling walker with 5" wheels    Recommendations for Other Services    Frequency  Min 2X/week    Precautions / Restrictions Precautions Precautions: Knee Precaution Comments: Reviewed precautions with patient. Nothing under knee and KI restrictions Restrictions Weight Bearing Restrictions: Yes LLE Weight Bearing: Weight bearing as tolerated   Pertinent Vitals/Pain Pt with 8/10 pain.  Nursing game meds during session for pain.      ADL  Eating/Feeding: Performed;Independent Where Assessed - Eating/Feeding: Chair Grooming: Performed;Denture care;Wash/dry face;Wash/dry hands;Set up Where Assessed - Grooming: Unsupported sitting Upper Body Bathing: Performed;Set up Where Assessed - Upper Body Bathing: Unsupported sitting Lower Body Bathing: Performed;Moderate assistance Where Assessed - Lower Body Bathing: Supported sit to stand Upper Body Dressing: Performed;Set up Where Assessed - Upper Body Dressing: Unsupported sitting Lower Body Dressing: Performed;Moderate assistance Where Assessed - Lower Body Dressing: Supported sit to Pharmacist, hospital: Performed;Minimal Dentist Method: Surveyor, minerals: Materials engineer and Hygiene: Performed;Minimal assistance Where  Assessed - Engineer, mining and Hygiene: Sit to stand from 3-in-1 or toilet Equipment Used: Rolling walker Transfers/Ambulation Related to ADLs: Pt with a great amount of pain but was able to do all mobility with min assist.  Pt good at recalling all safety info from previous PT session. ADL Comments: Pt doing well with adls.  Greater assist needed for LE adls due to pain.    OT Diagnosis: Generalized weakness;Acute pain  OT Problem List: Decreased knowledge of use of DME or AE;Pain;Decreased strength OT Treatment Interventions: Self-care/ADL training;DME and/or AE instruction;Therapeutic activities   OT Goals Acute Rehab OT Goals OT Goal Formulation: With patient/family Time For Goal Achievement: 02/07/12 Potential to Achieve Goals: Good ADL Goals Pt Will Perform Grooming: with supervision;Standing at sink ADL Goal: Grooming - Progress: Goal set today Pt Will Perform Lower Body Bathing: with supervision;Sit to stand from chair ADL Goal: Lower Body Bathing - Progress: Goal set today Pt Will Perform Lower Body Dressing: with supervision;Sit to stand from bed;Sit to stand from chair ADL Goal: Lower Body Dressing - Progress: Goal set today Pt Will Perform Tub/Shower Transfer: Tub transfer;Shower seat with back;with min assist ADL Goal: Tub/Shower Transfer - Progress: Goal set today Additional ADL Goal #1: Pt will complete all aspects of toileting on comfort height commode with S. ADL Goal: Additional Goal #1 - Progress: Goal set today  Visit Information  Last OT Received On: 01/24/12 Assistance Needed: +1    Subjective Data  Subjective: "I live alone and am very independent." Patient Stated Goal: to get back home.   Prior Functioning     Home Living Lives With: Alone Available Help at Discharge: Skilled Nursing Facility Type of Home: House Home Access: Level entry Home Layout: Multi-level Alternate Level Stairs-Number of Steps: Pt uses all three levels of her  home.  10+ steps. Bathroom Shower/Tub: Tub/shower unit;Door [employer] Toilet: Handicapped height Home Adaptive Equipment: None Prior Function Level of Independence: Independent Able to Take Stairs?: Yes Driving: Yes Vocation: Full time employment Comments: Engineer, manufacturing systems at [employer]: No difficulties Dominant Hand: Left         Vision/Perception Vision - Assessment Vision Assessment: Vision not tested   Cognition  Overall Cognitive Status: Appears within functional limits for tasks assessed/performed Arousal/Alertness: Awake/alert Orientation Level: Oriented X4 / Intact Behavior During Session: [facility] for tasks performed    Extremity/Trunk Assessment Right Upper Extremity Assessment RUE ROM/Strength/Tone: Within functional levels RUE Sensation: WFL - Light Touch RUE Coordination: WFL - gross/fine motor Left Upper Extremity Assessment LUE ROM/Strength/Tone: Within functional levels LUE Sensation: WFL - Light Touch LUE Coordination: WFL - gross/fine motor Trunk Assessment Trunk Assessment: Normal     Mobility Bed Mobility Bed Mobility: Sit to Supine Sit to Supine: 4: Min assist;HOB flat Details for Bed Mobility Assistance: assist needed to get L leg onto the bed. Transfers Transfers: Sit to Stand;Stand to Sit Sit to Stand: 4: Min assist;With upper extremity assist;From chair/3-in-1;From bed Stand to Sit: To bed;To chair/3-in-1;4: Min assist;With upper extremity assist Details for Transfer Assistance: Pt very safe today with hand placement and backing all the way up to item sitting on.     Shoulder Instructions     Exercise Total Joint Exercises Quad Sets: AROM;Left;10 reps Heel Slides: AAROM;Left;10 reps   Balance     End of Session OT - End of Session Equipment Utilized During Treatment: Left knee immobilizer Activity Tolerance: Patient tolerated treatment well Patient left: in bed;with call bell/phone within  reach;with family/visitor present Nurse Communication: Mobility status  GO     Hope Budds 01/24/2012, 11:05 AM [phone]

## 2012-01-24 NOTE — Progress Notes (Signed)
ANTICOAGULATION CONSULT NOTE - Follow up Consult  Pharmacy Consult for Coumadin Indication: VTE prophylaxis  Allergies  Allergen Reactions  . Codeine Other (See Comments)    Abnormal behavior  . Gabapentin     REACTION: nausea  . Ibuprofen Nausea And Vomiting  . Simvastatin     REACTION: achy  . Iodine Swelling and Rash    Vital Signs: Temp: 98.8 F (37.1 C) (12/05 0628) BP: 117/44 mmHg (12/05 0628) Pulse Rate: 79  (12/05 0628)  Labs:  Basename 01/24/12 0600 01/23/12 0806  HGB 9.9* --  HCT 28.0* --  PLT 192 --  APTT -- --  LABPROT 14.2 --  INR 1.11 --  HEPARINUNFRC -- --  CREATININE 0.75 0.89  CKTOTAL -- --  CKMB -- --  TROPONINI -- --    The CrCl is unknown because both a height and weight (above a minimum accepted value) are required for this calculation.   Medical History: Past Medical History  Diagnosis Date  . History of pneumonia 2008  . Hyperlipemia   . Osteoarthritis   . MI (myocardial infarction)     DR Tenny Craw  . HTN (hypertension)     dr Posey Rea  . CAD (coronary artery disease)     DR Tenny Craw; by notes normal cath in 1986, normal stress 2013    Medications:  Prescriptions prior to admission  Medication Sig Dispense Refill  . amLODipine (NORVASC) 5 MG tablet Take 5 mg by mouth daily.      . Ascorbic Acid (VITAMIN C) 100 MG tablet Take 100 mg by mouth daily.      Marland Kitchen aspirin 81 MG EC tablet Take 81 mg by mouth daily.        . cholecalciferol (VITAMIN D) 400 UNITS TABS Take 400 Units by mouth daily.      Marland Kitchen dexlansoprazole (DEXILANT) 60 MG capsule Take 60 mg by mouth daily.      . diclofenac sodium (VOLTAREN) 1 % GEL Apply 1 application topically 4 (four) times daily.      Marland Kitchen escitalopram (LEXAPRO) 10 MG tablet Take 10 mg by mouth daily.      Marland Kitchen gabapentin (NEURONTIN) 100 MG capsule Take 100 mg by mouth daily.      Marland Kitchen HYDROcodone-acetaminophen (NORCO/VICODIN) 5-325 MG per tablet Take 1 tablet by mouth 4 (four) times daily as needed. For pain      .  metoprolol succinate (TOPROL-XL) 50 MG 24 hr tablet Take 50 mg by mouth at bedtime. Take at hs      . rosuvastatin (CRESTOR) 5 MG tablet Take 5 mg by mouth 2 (two) times a week.      . temazepam (RESTORIL) 15 MG capsule Take 30 mg by mouth at bedtime.      . triamterene-hydrochlorothiazide (MAXZIDE-25) 37.5-25 MG per tablet Take 1 tablet by mouth daily.      . vitamin B-12 (CYANOCOBALAMIN) 100 MCG tablet Take 100 mcg by mouth daily.      Marland Kitchen triamcinolone cream (KENALOG) 0.5 % Apply 1 application topically daily as needed. rash        Assessment: 77 y/o female patient admitted with left DJD, s/p L TKA requiring coumadin for DVT px. Baseline INR wnl. No drug interactions identified. Lovenox initiated.  Goal of Therapy:  INR 2-3 Monitor platelets by anticoagulation protocol: Yes   Plan:  Repeat Coumadin 5mg  today, continue lovenox until INR>2 and f/u daily protime.  Verlene Mayer, PharmD, BCPS Pager [phone] 01/24/2012,11:24 AM

## 2012-01-24 NOTE — Clinical Social Work Psychosocial (Signed)
Clinical Social Worker received referral for the potential need for post acute placement at a SNF. CSW reviewed chart and met with patient at bedside. CSW introduced self, explained role, and provided emotional support. CSW discussed skilled nursing home placement, reviewed the process of placement and answered all the patient's questions. After reviewing the SNF list, Patient reported that she toured [place] and would like to go to there after d/c. CSW encouraged patient to ask questions as needed of CSW and medical staff. CSW will begin all paperwork necessary to get patient to [facility]. CSW will continue to follow and assist with all d/c planning.  Patient and patient's family were very appreciative of support and information provided by CSW. CSW will continue to follow and will assist with all d/c planning needs Sabino Niemann, MSW, [phone]

## 2012-01-24 NOTE — Progress Notes (Signed)
Utilization review completed. Kruze Atchley, RN, BSN. 

## 2012-01-24 NOTE — Progress Notes (Signed)
Physical Therapy Treatment Patient Details Name: Terri King MRN: [id number] DOB: [birth year] Today's Date: 01/24/2012 Time: 7829-5621 PT Time Calculation (min): 33 min  PT Assessment / Plan / Recommendation Comments on Treatment Session  Patient continuing to make good progress with therapy. Continue with current POC    Follow Up Recommendations  SNF;Supervision/Assistance - 24 hour     Does the patient have the potential to tolerate intense rehabilitation     Barriers to Discharge        Equipment Recommendations  Rolling walker with 5" wheels    Recommendations for Other Services    Frequency 7X/week   Plan Discharge plan remains appropriate;Frequency remains appropriate    Precautions / Restrictions Precautions Precautions: Knee Precaution Comments: Reviewed precautions with patient. Nothing under knee and KI restrictions Restrictions Weight Bearing Restrictions: Yes LLE Weight Bearing: Weight bearing as tolerated   Pertinent Vitals/Pain     Mobility  Bed Mobility Bed Mobility: Sit to Supine Supine to Sit: 4: Min assist;With rails Sitting - Scoot to Edge of Bed: With rail;4: Min assist Sit to Supine: 4: Min assist;HOB flat Details for Bed Mobility Assistance: A for L LE out of bed Transfers Sit to Stand: 4: Min assist;With upper extremity assist;From toilet;From bed Stand to Sit: 4: Min assist;With upper extremity assist;To chair/3-in-1;To toilet Details for Transfer Assistance: Cues for safe technique and hand placement Ambulation/Gait Ambulation/Gait Assistance: 4: Min assist Ambulation Distance (Feet): 80 Feet Assistive device: Rolling walker Ambulation/Gait Assistance Details: Cues for gait sequence and step length. A for balance and safety Gait Pattern: Step-to pattern;Trunk flexed;Decreased step length - right;Decreased step length - left Gait velocity: decreased    Exercises Total Joint Exercises Quad Sets: AROM;Left;10 reps Heel Slides:  AAROM;Left;10 reps Straight Leg Raises: AAROM;Left;10 reps   PT Diagnosis:    PT Problem List:   PT Treatment Interventions:     PT Goals Acute Rehab PT Goals PT Goal: Supine/Side to Sit - Progress: Progressing toward goal PT Goal: Sit to Stand - Progress: Progressing toward goal PT Goal: Stand to Sit - Progress: Progressing toward goal PT Transfer Goal: Bed to Chair/Chair to Bed - Progress: Progressing toward goal PT Goal: Ambulate - Progress: Progressing toward goal PT Goal: Perform Home Exercise Program - Progress: Progressing toward goal  Visit Information  Last PT Received On: 01/24/12 Assistance Needed: +1    Subjective Data      Cognition  Overall Cognitive Status: Appears within functional limits for tasks assessed/performed Arousal/Alertness: Awake/alert Orientation Level: Appears intact for tasks assessed Behavior During Session: [facility] for tasks performed    Balance     End of Session PT - End of Session Equipment Utilized During Treatment: Gait belt;Left knee immobilizer Activity Tolerance: Patient tolerated treatment well Patient left: in chair;with call bell/phone within reach Nurse Communication: Mobility status   GP     Fredrich Birks 01/24/2012, 1:58 PM 01/24/2012 Fredrich Birks PTA [phone] pager [phone] office

## 2012-01-24 NOTE — Op Note (Signed)
NAMEKORINNA, TAT NO.:  [id number]  MEDICAL RECORD NO.:  [id number]  LOCATION:  [id number]                        FACILITY:  [facility]  PHYSICIAN:  Loreta Ave, M.D. DATE OF BIRTH:  Oct 03, 1934  DATE OF PROCEDURE:  01/23/2012 DATE OF DISCHARGE:                              OPERATIVE REPORT   PREOPERATIVE DIAGNOSES:  Left knee end-stage degenerative arthritis. Marked varus and flexion contracture.  Bone loss medial compartment, especially on the proximal tibia.  POSTOPERATIVE DIAGNOSES:  Left knee end-stage degenerative arthritis. Marked varus and flexion contracture.  Bone loss medial compartment, especially on the proximal tibia with moderate to extreme localized osteopenia throughout the knee, femur, patella and tibia.  PROCEDURE:  Modified minimally invasive left total knee replacement, Stryker triathlon prosthesis.  Soft tissue balancing.  Cemented pegged posterior stabilized #4 femoral component.  Cemented #5 tibial component with a 12 x 100 mm centralizing stem.  An 11 mm polyethylene component. Cemented resurfacing 32 mm patellar component.  SURGEON:  Loreta Ave, M.D.  ASSISTANT:  Genene Churn. Barry Dienes, PA-C, present throughout the entire case, necessary for timely completion of procedure.  ANESTHESIA:  General.  BLOOD LOSS:  Minimal.  SPECIMENS:  None.  CULTURES:  None.  COMPLICATION:  None.  DRESSING:  Soft compressive knee immobilizer.  DRAIN:  Hemovac x1.  TOURNIQUET TIME:  1 hour.  PROCEDURE:  The patient was brought to the operating room, placed on the operating table in supine position.  After adequate anesthesia had been obtained, tourniquet applied, prepped and draped in usual sterile fashion.  Exsanguinated with elevation of Esmarch.  Tourniquet inflated to 350 mmHg.  Knee was examined.  More than 5 degree flexion contracture fixed.  A 10 degrees of varus fixed.  Anterior approach.  Straight incision above the patella down to  tibial tubercle.  Skin and subcutaneous tissue divided.  Hemostasis with cautery.  Medial arthrotomy.  Medial capsule release relatively generous.  Throughout the entire procedure noted to be very osteopenic soft bone throughout. Distal femur exposed.  Intramedullary guide placed.  A 10 mm resection 5 degrees of valgus.  Using epicondylar axis, the femur was sized, cut, and fitted for a pegged #4 posterior stabilized component.  Proximal tibial resection extramedullary guide 0-degree cut.  Marked varus at her knee from bone loss.  Also tibia varus throughout the entire length.  I felt like I got a very nice perpendicular cut.  Obviously, much more resection of bone laterally staying above the fibular head. Periarticular spurs removed.  Patella exposed.  Posterior 10 mm removed. Drilled, sized, and fitted for a 32 mm component.  Debris cleared throughout anterior posterior aspect of the knee.  Nicely balanced in flexion and extension.  Trials put in place.  A #5 on the tibia, #4 on the femur, and an 11 mm insert and 32 patella.  With this construct, after bony cuts and soft tissue balancing very pleased with biomechanical axis, full motion, good balancing in flexion, extension with good stability in both.  Excellent patellofemoral tracking.  Tibia was marked for rotation.  This was then hand reamed.  I added a centralizing 12 x 100 mm stem because of her degree of  osteopenia and softness of her tibia.  All the reaming complete.  All trials removed. Copious irrigation with a pulse irrigating device.  Cement prepared, placed on all components.  The tibial component assembled, seated in place.  Polyethylene attached.  Knee reduced.  Patella with a clamp. After cement hardened, the knee was reexamined.  Again, pleased with biomechanical axis, balancing motion and tracking.  Hemovac was placed and brought out through a separate stab wound.  Arthrotomy closed with #1 Vicryl.  Skin and  subcutaneous tissue with Vicryl and staples. Sterile compressive dressing applied.  Tourniquet deflated and removed. Knee immobilizer applied.  Anesthesia reversed.  Brought to recovery room.  Tolerated the surgery well.  No complications.     Loreta Ave, M.D.     DFM/MEDQ  D:  01/23/2012  T:  01/24/2012  Job:  [id number]

## 2012-01-24 NOTE — Progress Notes (Signed)
Physical Therapy Treatment Patient Details Name: Terri King MRN: [id number] DOB: 16-Mar-1934 Today's Date: 01/24/2012 Time: 0835-0900 PT Time Calculation (min): 25 min  PT Assessment / Plan / Recommendation Comments on Treatment Session  Patient progressing well with ambulation this session. Continues to progress with mobility this afternoon. Son was educated on mobility and precautions.     Follow Up Recommendations  SNF;Supervision/Assistance - 24 hour     Does the patient have the potential to tolerate intense rehabilitation     Barriers to Discharge        Equipment Recommendations  Rolling walker with 5" wheels;3 in 1 bedside comode    Recommendations for Other Services    Frequency 7X/week   Plan Discharge plan remains appropriate;Frequency remains appropriate    Precautions / Restrictions Precautions Precautions: Knee Precaution Comments: Reviewed precautions with patient. Nothing under knee and KI restrictions Restrictions Weight Bearing Restrictions: Yes LLE Weight Bearing: Weight bearing as tolerated   Pertinent Vitals/Pain     Mobility  Bed Mobility Bed Mobility: Not assessed Transfers Sit to Stand: 4: Min assist;With upper extremity assist;From chair/3-in-1;From bed Stand to Sit: To bed;To chair/3-in-1;4: Min assist;With upper extremity assist Details for Transfer Assistance: A to intitiate stand and to shift weight anteriorly  Ambulation/Gait Ambulation/Gait Assistance: 4: Min assist Ambulation Distance (Feet): 40 Feet Assistive device: Rolling walker Ambulation/Gait Assistance Details: Cues for gait sequence, use of RW, weight bearing status and posture.  Gait Pattern: Step-to pattern;Antalgic;Trunk flexed Gait velocity: decreased    Exercises Total Joint Exercises Quad Sets: AROM;Left;10 reps Heel Slides: AAROM;Left;10 reps   PT Diagnosis:    PT Problem List:   PT Treatment Interventions:     PT Goals Acute Rehab PT Goals PT Goal: Sit to  Stand - Progress: Progressing toward goal PT Goal: Stand to Sit - Progress: Progressing toward goal PT Transfer Goal: Bed to Chair/Chair to Bed - Progress: Progressing toward goal PT Goal: Ambulate - Progress: Progressing toward goal PT Goal: Perform Home Exercise Program - Progress: Progressing toward goal  Visit Information  Last PT Received On: 01/24/12 Assistance Needed: +1    Subjective Data      Cognition  Overall Cognitive Status: Appears within functional limits for tasks assessed/performed Arousal/Alertness: Awake/alert Orientation Level: Appears intact for tasks assessed Behavior During Session: [facility] for tasks performed    Balance     End of Session PT - End of Session Equipment Utilized During Treatment: Gait belt;Left knee immobilizer Activity Tolerance: Patient tolerated treatment well Patient left: in chair;with call bell/phone within reach;with family/visitor present Nurse Communication: Mobility status   GP     Fredrich Birks 01/24/2012, 9:10 AM 01/24/2012 Fredrich Birks PTA [phone] pager [phone] office

## 2012-01-25 ENCOUNTER — Encounter (HOSPITAL_COMMUNITY): Payer: Self-pay | Admitting: General Practice

## 2012-01-25 LAB — CBC
HCT: 26.1 % — ABNORMAL LOW (ref 36.0–46.0)
Hemoglobin: 9 g/dL — ABNORMAL LOW (ref 12.0–15.0)
MCH: 30.6 pg (ref 26.0–34.0)
MCHC: 34.5 g/dL (ref 30.0–36.0)
RDW: 12.6 % (ref 11.5–15.5)

## 2012-01-25 LAB — BASIC METABOLIC PANEL
BUN: 6 mg/dL (ref 6–23)
Chloride: 99 mEq/L (ref 96–112)
GFR calc non Af Amer: 83 mL/min — ABNORMAL LOW (ref >90)
Glucose, Bld: 122 mg/dL — ABNORMAL HIGH (ref 70–99)
Potassium: 3.4 mEq/L — ABNORMAL LOW (ref 3.5–5.1)

## 2012-01-25 LAB — PROTIME-INR: Prothrombin Time: 17.2 seconds — ABNORMAL HIGH (ref 11.6–15.2)

## 2012-01-25 MED ORDER — WARFARIN SODIUM 5 MG PO TABS
5.0000 mg | ORAL_TABLET | Freq: Once | ORAL | Status: DC
  Filled 2012-01-25: qty 1

## 2012-01-25 MED ORDER — METHOCARBAMOL 500 MG PO TABS: 500.0000 mg | ORAL_TABLET | Freq: Four times a day (QID) | ORAL | Status: DC | PRN

## 2012-01-25 MED ORDER — HYDROCODONE-ACETAMINOPHEN 10-325 MG PO TABS: 1.0000 | ORAL_TABLET | ORAL | Status: DC | PRN

## 2012-01-25 MED ORDER — WARFARIN SODIUM 5 MG PO TABS: 5.0000 mg | ORAL_TABLET | Freq: Every day | ORAL | Status: DC

## 2012-01-25 MED ORDER — ENOXAPARIN SODIUM 30 MG/0.3ML ~~LOC~~ SOLN: 30.0000 mg | Freq: Two times a day (BID) | SUBCUTANEOUS | Status: DC

## 2012-01-25 NOTE — Progress Notes (Signed)
Clinical social worker assisted with patient discharge to skilled nursing facility, [facility].  CSW addressed all family questions and concerns. CSW copied chart and added all important documents. CSW also set up patient transportation with [employer]. Clinical Social Worker will sign off for now as social work intervention is no longer needed.   Sabino Niemann, MSW, [employer] [phone]

## 2012-01-25 NOTE — Progress Notes (Signed)
CARE MANAGEMENT NOTE 01/25/2012  Patient:  Terri King, Terri King   Account Number:  [id number]  Date Initiated:  01/25/2012  Documentation initiated by:  Vance Peper  Subjective/Objective Assessment:   77 yr old female s/p left total knee arthroplasty.     Action/Plan:   Patient is going to SNF for shortterm rehab.Social Worker is aware.   Anticipated DC Date:  01/25/2012   Anticipated DC Plan:  SKILLED NURSING FACILITY  In-house referral  Clinical Social Worker      DC Planning Services  CM consult      Choice offered to / List presented to:             Status of service:  Completed, signed off Medicare Important Message given?   (If response is "NO", the following Medicare IM given date fields will be blank) Date Medicare IM given:   Date Additional Medicare IM given:    Discharge Disposition:  SKILLED NURSING FACILITY  Per UR Regulation:    If discussed at Long Length of Stay Meetings, dates discussed:    Comments:

## 2012-01-25 NOTE — Progress Notes (Signed)
Physical Therapy Treatment Patient Details Name: Terri King MRN: [id number] DOB: 06-Aug-1934 Today's Date: 01/25/2012 Time: 0865-7846 PT Time Calculation (min): 20 min  PT Assessment / Plan / Recommendation Comments on Treatment Session  Patient continues to make steady progress with therapy.     Follow Up Recommendations  SNF;Supervision/Assistance - 24 hour     Does the patient have the potential to tolerate intense rehabilitation     Barriers to Discharge        Equipment Recommendations  Rolling walker with 5" wheels    Recommendations for Other Services    Frequency 7X/week   Plan Discharge plan remains appropriate;Frequency remains appropriate    Precautions / Restrictions Precautions Precautions: Knee Required Braces or Orthoses: Knee Immobilizer - Left Knee Immobilizer - Left: On when out of bed or walking Restrictions Weight Bearing Restrictions: No LLE Weight Bearing: Weight bearing as tolerated   Pertinent Vitals/Pain     Mobility  Bed Mobility Bed Mobility: Not assessed Transfers Sit to Stand: 4: Min assist;With upper extremity assist;From chair/3-in-1 Stand to Sit: 4: Min guard;With upper extremity assist;To chair/3-in-1 Details for Transfer Assistance: A for stability. Cues for technique Ambulation/Gait Ambulation/Gait Assistance: 4: Min assist Ambulation Distance (Feet): 120 Feet Assistive device: Rolling walker Ambulation/Gait Assistance Details: Cues for gait sequence and positionnig inside of RW Gait Pattern: Step-to pattern;Decreased step length - right;Decreased step length - left Gait velocity: decreased    Exercises Total Joint Exercises Quad Sets: AROM;Left;15 reps Heel Slides: AAROM;Left;15 reps Hip ABduction/ADduction: AAROM;Left;15 reps Straight Leg Raises: AAROM;Left;15 reps   PT Diagnosis:    PT Problem List:   PT Treatment Interventions:     PT Goals Acute Rehab PT Goals PT Goal: Sit to Stand - Progress: Progressing toward  goal PT Goal: Stand to Sit - Progress: Progressing toward goal PT Transfer Goal: Bed to Chair/Chair to Bed - Progress: Progressing toward goal PT Goal: Ambulate - Progress: Progressing toward goal PT Goal: Perform Home Exercise Program - Progress: Progressing toward goal  Visit Information  Last PT Received On: 01/25/12 Assistance Needed: +1    Subjective Data      Cognition  Overall Cognitive Status: Appears within functional limits for tasks assessed/performed Arousal/Alertness: Awake/alert Orientation Level: Appears intact for tasks assessed Behavior During Session: [facility] for tasks performed    Balance     End of Session PT - End of Session Equipment Utilized During Treatment: Gait belt;Left knee immobilizer Activity Tolerance: Patient tolerated treatment well Patient left: in chair;with call bell/phone within reach;with family/visitor present Nurse Communication: Mobility status CPM Right Knee CPM Right Knee: Off   GP     Fredrich Birks 01/25/2012, 11:37 AM 01/25/2012 Fredrich Birks PTA [phone] pager [phone] office

## 2012-01-25 NOTE — Progress Notes (Signed)
ANTICOAGULATION CONSULT NOTE - Follow up Consult  Pharmacy Consult for Coumadin Indication: VTE prophylaxis  Allergies  Allergen Reactions  . Codeine Other (See Comments)    Abnormal behavior  . Gabapentin     REACTION: nausea  . Ibuprofen Nausea And Vomiting  . Simvastatin     REACTION: achy  . Iodine Swelling and Rash    Vital Signs: Temp: 99.7 F (37.6 C) (12/06 0600) BP: 156/54 mmHg (12/06 0600) Pulse Rate: 95  (12/06 0600)  Labs:  Basename 01/25/12 0622 01/24/12 0600 01/23/12 0806  HGB 9.0* 9.9* --  HCT 26.1* 28.0* --  PLT 177 192 --  APTT -- -- --  LABPROT 17.2* 14.2 --  INR 1.44 1.11 --  HEPARINUNFRC -- -- --  CREATININE -- 0.75 0.89  CKTOTAL -- -- --  CKMB -- -- --  TROPONINI -- -- --    The CrCl is unknown because both a height and weight (above a minimum accepted value) are required for this calculation.   Medical History: Past Medical History  Diagnosis Date  . History of pneumonia 2008  . Hyperlipemia   . Osteoarthritis   . MI (myocardial infarction)     DR Tenny Craw  . HTN (hypertension)     dr Posey Rea  . CAD (coronary artery disease)     DR Tenny Craw; by notes normal cath in 1986, normal stress 2013    Medications:  Prescriptions prior to admission  Medication Sig Dispense Refill  . amLODipine (NORVASC) 5 MG tablet Take 5 mg by mouth daily.      . Ascorbic Acid (VITAMIN C) 100 MG tablet Take 100 mg by mouth daily.      Marland Kitchen aspirin 81 MG EC tablet Take 81 mg by mouth daily.        . cholecalciferol (VITAMIN D) 400 UNITS TABS Take 400 Units by mouth daily.      Marland Kitchen dexlansoprazole (DEXILANT) 60 MG capsule Take 60 mg by mouth daily.      . diclofenac sodium (VOLTAREN) 1 % GEL Apply 1 application topically 4 (four) times daily.      Marland Kitchen escitalopram (LEXAPRO) 10 MG tablet Take 10 mg by mouth daily.      Marland Kitchen gabapentin (NEURONTIN) 100 MG capsule Take 100 mg by mouth daily.      Marland Kitchen HYDROcodone-acetaminophen (NORCO/VICODIN) 5-325 MG per tablet Take 1 tablet by  mouth 4 (four) times daily as needed. For pain      . metoprolol succinate (TOPROL-XL) 50 MG 24 hr tablet Take 50 mg by mouth at bedtime. Take at hs      . rosuvastatin (CRESTOR) 5 MG tablet Take 5 mg by mouth 2 (two) times a week.      . temazepam (RESTORIL) 15 MG capsule Take 30 mg by mouth at bedtime.      . triamterene-hydrochlorothiazide (MAXZIDE-25) 37.5-25 MG per tablet Take 1 tablet by mouth daily.      . vitamin B-12 (CYANOCOBALAMIN) 100 MCG tablet Take 100 mcg by mouth daily.      Marland Kitchen triamcinolone cream (KENALOG) 0.5 % Apply 1 application topically daily as needed. rash        Assessment: 77 y/o female patient admitted with left DJD, s/p L TKA requiring coumadin and lovenox for DVT px.  INR increasing but still subtherapeutic.  No bleeding noted.   Goal of Therapy:  INR 2-3 Monitor platelets by anticoagulation protocol: Yes   Plan:  Repeat Coumadin 5mg  today. Continue lovenox until INR>2. Daily PT/INR.  Wendie Simmer, PharmD, BCPS Clinical Pharmacist  Pager: [phone]

## 2012-01-25 NOTE — Discharge Summary (Signed)
Physician Discharge Summary  Patient ID: Terri King MRN: [id number] DOB/AGE: 21-Aug-1934 77 y.o.  Admit date: 01/23/2012 Discharge date: 01/25/2012  Admission Diagnoses:  Left knee djd  Discharge Diagnoses:  Left total knee replacement  Discharged Condition: good  Hospital Course: 77 yo bf with hx of end stage djd left knee was taken to the OR 4 dec for tkr.  Tolerated procedure well without complication.  Transferred to ortho unit and protocol lovenox and coumadin started for dvt prophylaxis.  5 dec, doing well.  Awaiting snf placement.  Slight bleeding thru dressing.  6 dec, doing well.  No complaint.  Wound looks good, staples intact.  No drainage or signs of infection.  Calf nt, nvi.  Has bed available at snf.  Ready for transfer.    Consults: None    Discharge Exam: Blood pressure 156/54, pulse 95, temperature 99.7 F (37.6 C), temperature source Oral, resp. rate 18, SpO2 95.00%. Disposition: transfer to snf today.  Discharge Orders    Future Appointments: Provider: Department: Dept Phone: Center:   05/13/2012 9:30 AM Tresa Garter, MD [facility] [phone] [facility]     Future Orders Please Complete By Expires   Diet - low sodium heart healthy      Call MD / Call 911      Comments:   If you experience chest pain or shortness of breath, CALL 911 and be transported to the hospital emergency room.  If you develope a fever above 101 F, pus (white drainage) or increased drainage or redness at the wound, or calf pain, call your surgeon's office.   Constipation Prevention      Comments:   Drink plenty of fluids.  Prune juice may be helpful.  You may use a stool softener, such as Colace (over the counter) 100 mg twice a day.  Use MiraLax (over the counter) for constipation as needed.   Increase activity slowly as tolerated      Discharge instructions      Comments:   Ok to shower, but no tub soaking.  Do not apply any creams or ointments to  incision.  Continue physical therapy protocol.   Driving restrictions      Comments:   No driving until further notice.   CPM      Comments:   Continuous passive motion machine (CPM):      Use the CPM from 0 to 70 degrees for 6-8 hours per day.      You may increase by 10 degrees per day as tolerated.  You may break it up into 2 or 3 sessions per day.      Use CPM for 3-4 weeks or until you are told to stop.   TED hose      Comments:   Use stockings (TED hose) for 3-4 weeks on both leg(s).  You may remove them at night for sleeping.   Change dressing      Comments:   Change dressing on left knee daily with sterile 4 x 4 inch gauze dressing and apply TED hose.   Do not put a pillow under the knee. Place it under the heel.          Medication List     As of 01/25/2012  1:13 PM    STOP taking these medications         aspirin 81 MG EC tablet      diclofenac sodium 1 % Gel   Commonly known as:  VOLTAREN      HYDROcodone-acetaminophen 5-325 MG per tablet   Commonly known as: NORCO/VICODIN      temazepam 15 MG capsule   Commonly known as: RESTORIL      vitamin C 100 MG tablet      TAKE these medications         amLODipine 5 MG tablet   Commonly known as: NORVASC   Take 5 mg by mouth daily.      cholecalciferol 400 UNITS Tabs   Commonly known as: VITAMIN D   Take 400 Units by mouth daily.      DEXILANT 60 MG capsule   Generic drug: dexlansoprazole   Take 60 mg by mouth daily.      enoxaparin 30 MG/0.3ML injection   Commonly known as: LOVENOX   Inject 0.3 mLs (30 mg total) into the skin every 12 (twelve) hours.      escitalopram 10 MG tablet   Commonly known as: LEXAPRO   Take 10 mg by mouth daily.      gabapentin 100 MG capsule   Commonly known as: NEURONTIN   Take 100 mg by mouth daily.      HYDROcodone-acetaminophen 10-325 MG per tablet   Commonly known as: NORCO   Take 1-2 tablets by mouth every 4 (four) hours as needed (breakthrough pain).       methocarbamol 500 MG tablet   Commonly known as: ROBAXIN   Take 1 tablet (500 mg total) by mouth every 6 (six) hours as needed (spasms).      metoprolol succinate 50 MG 24 hr tablet   Commonly known as: TOPROL-XL   Take 50 mg by mouth at bedtime. Take at hs      rosuvastatin 5 MG tablet   Commonly known as: CRESTOR   Take 5 mg by mouth 2 (two) times a week.      triamcinolone cream 0.5 %   Commonly known as: KENALOG   Apply 1 application topically daily as needed. rash      triamterene-hydrochlorothiazide 37.5-25 MG per tablet   Commonly known as: MAXZIDE-25   Take 1 tablet by mouth daily.      vitamin B-12 100 MCG tablet   Commonly known as: CYANOCOBALAMIN   Take 100 mcg by mouth daily.      warfarin 5 MG tablet   Commonly known as: COUMADIN   Take 1 tablet (5 mg total) by mouth daily.           Follow-up Information    Follow up with Loreta Ave, MD. (need return office visit 2 weeks postop)    Contact information:   [address] [place] [address] [phone]          Signed: Naida Sleight 01/25/2012, 1:13 PM

## 2012-01-25 NOTE — Progress Notes (Signed)
Pt discharged to [facility] SNF. Report called to nurse at [facility]. Pts IV was removed. Pt left unit in a stable condition via wheelchair accompanied by son.

## 2012-01-25 NOTE — Progress Notes (Signed)
Subjective Doing well.  No complaints.     Objective: Vital signs in last 24 hours: Temp:  [98 F (36.7 C)-100.4 F (38 C)] 99.7 F (37.6 C) (12/06 0600) Pulse Rate:  [65-95] 95  (12/06 0600) Resp:  [18-19] 18  (12/06 0600) BP: (137-156)/(41-54) 156/54 mmHg (12/06 0600) SpO2:  [95 %-100 %] 95 % (12/06 0600)  Intake/Output from previous day: 12/05 0701 - 12/06 0700 In: 510 [P.O.:510] Out: 140 [Drains:140] Intake/Output this shift: Total I/O In: 120 [P.O.:120] Out: -    Basename 01/25/12 0622 01/24/12 0600  HGB 9.0* 9.9*    Basename 01/25/12 0622 01/24/12 0600  WBC 10.4 9.6  RBC 2.94* 3.12*  HCT 26.1* 28.0*  PLT 177 192    Basename 01/25/12 0830 01/24/12 0600  NA 134* 134*  K 3.4* 3.2*  CL 99 100  CO2 25 24  BUN 6 10  CREATININE 0.68 0.75  GLUCOSE 122* 124*  CALCIUM 8.9 8.4    Basename 01/25/12 0622 01/24/12 0600  LABPT -- --  INR 1.44 1.11    Exam:  Wound looks good, staples intact.  No signs of infection.  Calf nt, nvi.  Drain removed.  Assessment/Plan: Transfer to snf today if bed available.   OWENS,JAMES M 01/25/2012, 1:19 PM

## 2012-01-25 NOTE — Progress Notes (Signed)
Physical Therapy Treatment Patient Details Name: Terri King MRN: [id number] DOB: 1934/06/29 Today's Date: 01/25/2012 Time: 1400-1420 PT Time Calculation (min): 20 min  PT Assessment / Plan / Recommendation Comments on Treatment Session  Patient continuing to make good progress. Plans to DC to [facility] today.     Follow Up Recommendations  SNF     Does the patient have the potential to tolerate intense rehabilitation     Barriers to Discharge        Equipment Recommendations  Rolling walker with 5" wheels    Recommendations for Other Services    Frequency 7X/week   Plan Discharge plan remains appropriate;Frequency remains appropriate    Precautions / Restrictions Precautions Precautions: Knee Required Braces or Orthoses: Knee Immobilizer - Left Knee Immobilizer - Left: On when out of bed or walking Restrictions LLE Weight Bearing: Weight bearing as tolerated   Pertinent Vitals/Pain     Mobility  Bed Mobility Bed Mobility: Not assessed Sit to Supine: 4: Min assist Details for Bed Mobility Assistance: A for L LE Transfers Sit to Stand: 4: Min guard;With upper extremity assist Stand to Sit: 4: Min guard;With upper extremity assist Details for Transfer Assistance: Cues for technique Ambulation/Gait Ambulation/Gait Assistance: 4: Min guard Ambulation Distance (Feet): 100 Feet Assistive device: Rolling walker Ambulation/Gait Assistance Details: Patient starting to ambuilate with step through gait pattern. Progressing well Gait Pattern: Step-through pattern;Decreased stride length Gait velocity: decresed    Exercises Total Joint Exercises Quad Sets: AROM;Left;15 reps Heel Slides: AAROM;Left;15 reps Hip ABduction/ADduction: AAROM;Left;15 reps Straight Leg Raises: AAROM;Left;15 reps   PT Diagnosis:    PT Problem List:   PT Treatment Interventions:     PT Goals Acute Rehab PT Goals PT Goal: Sit to Supine/Side - Progress: Progressing toward goal PT Goal: Sit  to Stand - Progress: Progressing toward goal PT Goal: Stand to Sit - Progress: Progressing toward goal PT Transfer Goal: Bed to Chair/Chair to Bed - Progress: Progressing toward goal PT Goal: Ambulate - Progress: Progressing toward goal PT Goal: Perform Home Exercise Program - Progress: Progressing toward goal  Visit Information  Last PT Received On: 01/25/12 Assistance Needed: +1    Subjective Data      Cognition  Overall Cognitive Status: Appears within functional limits for tasks assessed/performed Arousal/Alertness: Awake/alert Orientation Level: Appears intact for tasks assessed Behavior During Session: [facility] for tasks performed    Balance     End of Session PT - End of Session Equipment Utilized During Treatment: Gait belt;Left knee immobilizer Activity Tolerance: Patient tolerated treatment well Patient left: with call bell/phone within reach;in bed Nurse Communication: Mobility status   GP     Fredrich Birks 01/25/2012, 2:41 PM 01/25/2012 Fredrich Birks PTA [phone] pager [phone] office

## 2012-02-03 ENCOUNTER — Other Ambulatory Visit: Payer: Self-pay | Admitting: Internal Medicine

## 2012-02-04 ENCOUNTER — Other Ambulatory Visit: Payer: Self-pay | Admitting: Internal Medicine

## 2012-02-04 ENCOUNTER — Other Ambulatory Visit: Payer: Managed Care, Other (non HMO)

## 2012-02-12 LAB — PROTIME-INR

## 2012-03-04 ENCOUNTER — Telehealth: Payer: Self-pay | Admitting: *Deleted

## 2012-03-04 NOTE — Telephone Encounter (Signed)
Faxed completed PA form for Dexilant to ins.

## 2012-03-18 ENCOUNTER — Other Ambulatory Visit: Payer: Self-pay | Admitting: Internal Medicine

## 2012-03-19 ENCOUNTER — Telehealth: Payer: Self-pay | Admitting: *Deleted

## 2012-03-19 MED ORDER — TEMAZEPAM 15 MG PO CAPS: 15.0000 mg | ORAL_CAPSULE | Freq: Every evening | ORAL | Status: DC | PRN

## 2012-03-19 NOTE — Telephone Encounter (Signed)
Rf req for Temazepam 15 mg 1-2 po qhs if needed. Last filled 02/22/12. Ok to Rf?

## 2012-03-19 NOTE — Telephone Encounter (Signed)
OK to fill this prescription with additional refills x5 Thank you!  

## 2012-03-19 NOTE — Telephone Encounter (Signed)
Done

## 2012-05-13 ENCOUNTER — Ambulatory Visit: Payer: Managed Care, Other (non HMO) | Admitting: Internal Medicine

## 2012-05-20 ENCOUNTER — Encounter: Payer: Self-pay | Admitting: Internal Medicine

## 2012-05-20 ENCOUNTER — Ambulatory Visit (INDEPENDENT_AMBULATORY_CARE_PROVIDER_SITE_OTHER): Payer: Medicare PPO | Admitting: Internal Medicine

## 2012-05-20 ENCOUNTER — Other Ambulatory Visit (INDEPENDENT_AMBULATORY_CARE_PROVIDER_SITE_OTHER): Payer: Medicare PPO

## 2012-05-20 VITALS — BP 148/60 | HR 68 | Temp 97.7°F | Resp 16 | Wt 168.0 lb

## 2012-05-20 DIAGNOSIS — M25562 Pain in left knee: Secondary | ICD-10-CM

## 2012-05-20 DIAGNOSIS — M25569 Pain in unspecified knee: Secondary | ICD-10-CM

## 2012-05-20 DIAGNOSIS — I10 Essential (primary) hypertension: Secondary | ICD-10-CM

## 2012-05-20 DIAGNOSIS — R7309 Other abnormal glucose: Secondary | ICD-10-CM

## 2012-05-20 DIAGNOSIS — M1990 Unspecified osteoarthritis, unspecified site: Secondary | ICD-10-CM

## 2012-05-20 DIAGNOSIS — E785 Hyperlipidemia, unspecified: Secondary | ICD-10-CM

## 2012-05-20 LAB — BASIC METABOLIC PANEL
BUN: 10 mg/dL (ref 6–23)
Chloride: 103 mEq/L (ref 96–112)
Glucose, Bld: 91 mg/dL (ref 70–99)
Potassium: 3.5 mEq/L (ref 3.5–5.1)

## 2012-05-20 MED ORDER — IBUPROFEN 600 MG PO TABS: ORAL_TABLET | ORAL | Status: DC

## 2012-05-20 MED ORDER — HYDROCODONE-ACETAMINOPHEN 10-325 MG PO TABS: 1.0000 | ORAL_TABLET | Freq: Three times a day (TID) | ORAL | Status: DC | PRN

## 2012-05-20 NOTE — Assessment & Plan Note (Signed)
Labs  Lost wt 

## 2012-05-20 NOTE — Assessment & Plan Note (Signed)
Continue with current prescription therapy as reflected on the Med list.  

## 2012-05-20 NOTE — Assessment & Plan Note (Signed)
Ibuprofen Elevate knee Vicodin prn

## 2012-05-20 NOTE — Patient Instructions (Signed)
Hold crestor  Wt Readings from Last 3 Encounters:  05/20/12 168 lb (76.204 kg)  01/18/12 174 lb 6 oz (79.096 kg)  01/14/12 174 lb (78.926 kg)

## 2012-05-20 NOTE — Assessment & Plan Note (Signed)
Hold crestor 

## 2012-05-20 NOTE — Progress Notes (Signed)
Subjective:     HPI    F/u s/p L TKR on 01/23/12 - she is still in a lot of pain...  The patient presents for a follow-up of  Chronic CAD, hypertension, chronic dyslipidemia, OA controlled with medicines, stable F/u depression, anxiety - better on Rx  Past Medical History  Diagnosis Date  . History of pneumonia 2008  . Hyperlipemia   . Osteoarthritis   . MI (myocardial infarction)     DR Tenny Craw  . HTN (hypertension)     dr Posey Rea  . CAD (coronary artery disease)     DR Tenny Craw; by notes normal cath in 1986, normal stress 2013  . Family history of anesthesia complication     NIENCE had sensitivity  . Shortness of breath    Past Surgical History  Procedure Laterality Date  . Tonsillectomy    . Partial hysterectomy    . Knee arthroscopy      left  . Total knee arthroplasty  01/23/2012    Procedure: TOTAL KNEE ARTHROPLASTY;  Surgeon: Loreta Ave, MD;  Location: [facility] OR;  Service: Orthopedics;  Laterality: Left;  . Total knee arthroplasty  12/'05/2011    left knee    reports that she has never smoked. She has never used smokeless tobacco. She reports that she does not drink alcohol or use illicit drugs. family history includes Glaucoma in her father and Hypertension in her mother and unspecified family member. Allergies  Allergen Reactions  . Codeine Other (See Comments)    Abnormal behavior  . Gabapentin     REACTION: nausea  . Ibuprofen Nausea And Vomiting  . Simvastatin     REACTION: achy  . Tylenol (Acetaminophen)   . Iodine Swelling and Rash   Current Outpatient Prescriptions on File Prior to Visit  Medication Sig Dispense Refill  . amLODipine (NORVASC) 5 MG tablet Take 5 mg by mouth daily.      . cholecalciferol (VITAMIN D) 400 UNITS TABS Take 400 Units by mouth daily.      Marland Kitchen DEXILANT 60 MG capsule take 1 capsule by mouth once daily  90 capsule  3  . enoxaparin (LOVENOX) 30 MG/0.3ML injection Inject 0.3 mLs (30 mg total) into the skin every 12 (twelve)  hours.  0 Syringe    . escitalopram (LEXAPRO) 10 MG tablet take 1 tablet by mouth once daily  30 tablet  5  . gabapentin (NEURONTIN) 100 MG capsule Take 100 mg by mouth daily.      Marland Kitchen HYDROcodone-acetaminophen (NORCO) 10-325 MG per tablet Take 1-2 tablets by mouth every 4 (four) hours as needed (breakthrough pain).  30 tablet    . methocarbamol (ROBAXIN) 500 MG tablet Take 1 tablet (500 mg total) by mouth every 6 (six) hours as needed (spasms).      . metoprolol succinate (TOPROL-XL) 50 MG 24 hr tablet Take 50 mg by mouth at bedtime. Take at hs      . rosuvastatin (CRESTOR) 5 MG tablet Take 5 mg by mouth 2 (two) times a week.      . temazepam (RESTORIL) 15 MG capsule Take 1-2 capsules (15-30 mg total) by mouth at bedtime as needed.  60 capsule  5  . triamcinolone cream (KENALOG) 0.5 % Apply 1 application topically daily as needed. rash      . triamterene-hydrochlorothiazide (MAXZIDE-25) 37.5-25 MG per tablet take 1 tablet by mouth once daily  30 tablet  5  . vitamin B-12 (CYANOCOBALAMIN) 100 MCG tablet Take 100 mcg  by mouth daily.      Marland Kitchen warfarin (COUMADIN) 5 MG tablet Take 1 tablet (5 mg total) by mouth daily.       No current facility-administered medications on file prior to visit.   BP 148/60  Pulse 68  Temp(Src) 97.7 F (36.5 C) (Oral)  Resp 16  Wt 168 lb (76.204 kg)  BMI 29.77 kg/m2    Wt Readings from Last 3 Encounters:  05/20/12 168 lb (76.204 kg)  01/18/12 174 lb 6 oz (79.096 kg)  01/14/12 174 lb (78.926 kg)   BP Readings from Last 3 Encounters:  05/20/12 148/60  01/25/12 156/54  01/25/12 156/54      Review of Systems  Constitutional: Negative for chills, activity change, appetite change and unexpected weight change.  HENT: Negative for mouth sores and sinus pressure.   Eyes: Negative for visual disturbance.  Respiratory: Negative for chest tightness.   Gastrointestinal: Negative for nausea and abdominal pain.  Genitourinary: Negative for frequency, difficulty  urinating and vaginal pain.  Musculoskeletal: Positive for myalgias, back pain, arthralgias and gait problem.  Skin: Negative for pallor and wound.  Neurological: Negative for dizziness, tremors, weakness and headaches.  Psychiatric/Behavioral: Positive for sleep disturbance. Negative for suicidal ideas, behavioral problems, confusion and decreased concentration. The patient is nervous/anxious.        Objective:   Physical Exam  Constitutional: She appears well-developed. No distress.  HENT:  Head: Normocephalic.  Right Ear: External ear normal.  Left Ear: External ear normal.  Nose: Nose normal.  Mouth/Throat: Oropharynx is clear and moist.  Eyes: Conjunctivae are normal. Pupils are equal, round, and reactive to light. Right eye exhibits no discharge. Left eye exhibits no discharge.  Neck: Normal range of motion. Neck supple. No JVD present. No tracheal deviation present. No thyromegaly present.  Cardiovascular: Normal rate, regular rhythm and normal heart sounds.   Pulmonary/Chest: No stridor. No respiratory distress. She has no wheezes.  Abdominal: Soft. Bowel sounds are normal. She exhibits no distension and no mass. There is no tenderness. There is no rebound and no guarding.  Musculoskeletal: She exhibits tenderness (L knee is warm and swollen). She exhibits no edema.  Lymphadenopathy:    She has no cervical adenopathy.  Neurological: She displays normal reflexes. No cranial nerve deficit. She exhibits normal muscle tone. Coordination normal.  Skin: No rash noted. No erythema.  Psychiatric: She has a normal mood and affect. Her behavior is normal. Judgment and thought content normal.  Not suicidal or homicidal  Cane   Lab Results  Component Value Date   WBC 10.4 01/25/2012   HGB 9.0* 01/25/2012   HCT 26.1* 01/25/2012   PLT 177 01/25/2012   GLUCOSE 122* 01/25/2012   CHOL 212* 11/21/2011   TRIG 140.0 11/21/2011   HDL 63.40 11/21/2011   LDLDIRECT 119.2 11/21/2011   LDLCALC 101*  09/02/2009   ALT 11 01/23/2012   AST 21 01/23/2012   NA 134* 01/25/2012   K 3.4* 01/25/2012   CL 99 01/25/2012   CREATININE 0.68 01/25/2012   BUN 6 01/25/2012   CO2 25 01/25/2012   TSH 1.25 03/28/2011   INR 1.44 01/25/2012   HGBA1C 6.0 03/28/2011   07/17/11 stress test--  Impression  Exercise Capacity: Lexiscan with low level exercise.  BP Response: Normal blood pressure response.  Clinical Symptoms: No chest pain.  ECG Impression: No significant ST segment change suggestive of ischemia.  Comparison with Prior Nuclear Study: No images to compare  Overall Impression: Normal stress nuclear study.  LV Ejection Fraction: 76%. LV Wall Motion: NL LV Function; NL Wall Motion  Cassell Clement       Assessment & Plan:

## 2012-05-21 NOTE — Progress Notes (Signed)
Quick Note:  Patient Informed and voiced understanding ______ 

## 2012-06-14 ENCOUNTER — Other Ambulatory Visit: Payer: Self-pay | Admitting: Internal Medicine

## 2012-07-08 ENCOUNTER — Other Ambulatory Visit: Payer: Self-pay | Admitting: Internal Medicine

## 2012-07-21 ENCOUNTER — Ambulatory Visit: Payer: Medicare PPO | Admitting: Internal Medicine

## 2012-07-31 ENCOUNTER — Encounter: Payer: Self-pay | Admitting: Internal Medicine

## 2012-07-31 ENCOUNTER — Ambulatory Visit (INDEPENDENT_AMBULATORY_CARE_PROVIDER_SITE_OTHER): Payer: Medicare PPO | Admitting: Internal Medicine

## 2012-07-31 VITALS — BP 158/90 | HR 80 | Temp 98.6°F | Resp 16 | Wt 174.0 lb

## 2012-07-31 DIAGNOSIS — R7309 Other abnormal glucose: Secondary | ICD-10-CM

## 2012-07-31 DIAGNOSIS — F32A Depression, unspecified: Secondary | ICD-10-CM

## 2012-07-31 DIAGNOSIS — M1990 Unspecified osteoarthritis, unspecified site: Secondary | ICD-10-CM

## 2012-07-31 DIAGNOSIS — F329 Major depressive disorder, single episode, unspecified: Secondary | ICD-10-CM

## 2012-07-31 DIAGNOSIS — I10 Essential (primary) hypertension: Secondary | ICD-10-CM

## 2012-07-31 DIAGNOSIS — I2510 Atherosclerotic heart disease of native coronary artery without angina pectoris: Secondary | ICD-10-CM

## 2012-07-31 MED ORDER — TRIAMCINOLONE ACETONIDE 0.5 % EX CREA: TOPICAL_CREAM | Freq: Three times a day (TID) | CUTANEOUS | Status: DC

## 2012-07-31 MED ORDER — TEMAZEPAM 30 MG PO CAPS: 30.0000 mg | ORAL_CAPSULE | Freq: Every evening | ORAL | Status: DC | PRN

## 2012-07-31 NOTE — Progress Notes (Signed)
Subjective:     HPI    F/u s/p L TKR on 01/23/12 - she is better... She is retired  The patient presents for a follow-up of  chronic CAD, hypertension, chronic dyslipidemia, OA controlled with medicines, stable F/u depression, anxiety - better on Rx  Past Medical History  Diagnosis Date  . History of pneumonia 2008  . Hyperlipemia   . Osteoarthritis   . MI (myocardial infarction)     DR Tenny Craw  . HTN (hypertension)     dr Posey Rea  . CAD (coronary artery disease)     DR Tenny Craw; by notes normal cath in 1986, normal stress 2013  . Family history of anesthesia complication     NIENCE had sensitivity  . Shortness of breath    Past Surgical History  Procedure Laterality Date  . Tonsillectomy    . Partial hysterectomy    . Knee arthroscopy      left  . Total knee arthroplasty  01/23/2012    Procedure: TOTAL KNEE ARTHROPLASTY;  Surgeon: Loreta Ave, MD;  Location: [facility] OR;  Service: Orthopedics;  Laterality: Left;  . Total knee arthroplasty  12/'05/2011    left knee  . Joint replacement  12/13    L TKR     reports that she has never smoked. She has never used smokeless tobacco. She reports that she does not drink alcohol or use illicit drugs. family history includes Glaucoma in her father and Hypertension in her mother and unspecified family member. Allergies  Allergen Reactions  . Codeine Other (See Comments)    Abnormal behavior  . Gabapentin     REACTION: nausea  . Ibuprofen Nausea And Vomiting  . Simvastatin     REACTION: achy  . Tylenol (Acetaminophen)   . Iodine Swelling and Rash   Current Outpatient Prescriptions on File Prior to Visit  Medication Sig Dispense Refill  . amLODipine (NORVASC) 5 MG tablet take 1 tablet by mouth once daily  30 tablet  5  . cholecalciferol (VITAMIN D) 400 UNITS TABS Take 400 Units by mouth daily.      Marland Kitchen enoxaparin (LOVENOX) 30 MG/0.3ML injection Inject 0.3 mLs (30 mg total) into the skin every 12 (twelve) hours.  0 Syringe    .  escitalopram (LEXAPRO) 10 MG tablet take 1 tablet by mouth once daily  30 tablet  5  . gabapentin (NEURONTIN) 100 MG capsule Take 100 mg by mouth daily.      Marland Kitchen gabapentin (NEURONTIN) 100 MG capsule take 1 capsule by mouth three times a day  90 capsule  5  . HYDROcodone-acetaminophen (NORCO) 10-325 MG per tablet take 1 to 2 tablets by mouth every 8 hours if needed for Breakthrough pain  100 tablet  0  . ibuprofen (ADVIL,MOTRIN) 600 MG tablet Take twice a day x 2-3 weeks, then prn pain  60 tablet  3  . methocarbamol (ROBAXIN) 500 MG tablet Take 1 tablet (500 mg total) by mouth every 6 (six) hours as needed (spasms).      . metoprolol succinate (TOPROL-XL) 50 MG 24 hr tablet Take 50 mg by mouth at bedtime. Take at hs      . temazepam (RESTORIL) 15 MG capsule Take 1-2 capsules (15-30 mg total) by mouth at bedtime as needed.  60 capsule  5  . triamterene-hydrochlorothiazide (MAXZIDE-25) 37.5-25 MG per tablet take 1 tablet by mouth once daily  30 tablet  5  . vitamin B-12 (CYANOCOBALAMIN) 100 MCG tablet Take 100 mcg by  mouth daily.      Marland Kitchen DEXILANT 60 MG capsule take 1 capsule by mouth once daily  90 capsule  3  . rosuvastatin (CRESTOR) 5 MG tablet Take 5 mg by mouth 2 (two) times a week.       No current facility-administered medications on file prior to visit.   BP 158/90  Pulse 80  Temp(Src) 98.6 F (37 C) (Oral)  Resp 16  Wt 174 lb (78.926 kg)  BMI 30.83 kg/m2    Wt Readings from Last 3 Encounters:  07/31/12 174 lb (78.926 kg)  05/20/12 168 lb (76.204 kg)  01/18/12 174 lb 6 oz (79.096 kg)   BP Readings from Last 3 Encounters:  07/31/12 158/90  05/20/12 148/60  01/25/12 156/54      Review of Systems  Constitutional: Negative for chills, activity change, appetite change and unexpected weight change.  HENT: Negative for mouth sores and sinus pressure.   Eyes: Negative for visual disturbance.  Respiratory: Negative for chest tightness.   Gastrointestinal: Negative for nausea and  abdominal pain.  Genitourinary: Negative for frequency, difficulty urinating and vaginal pain.  Musculoskeletal: Positive for myalgias, back pain, arthralgias and gait problem.  Skin: Negative for pallor and wound.  Neurological: Negative for dizziness, tremors, weakness and headaches.  Psychiatric/Behavioral: Positive for sleep disturbance. Negative for suicidal ideas, behavioral problems, confusion and decreased concentration. The patient is nervous/anxious.        Objective:   Physical Exam  Constitutional: She appears well-developed. No distress.  HENT:  Head: Normocephalic.  Right Ear: External ear normal.  Left Ear: External ear normal.  Nose: Nose normal.  Mouth/Throat: Oropharynx is clear and moist.  Eyes: Conjunctivae are normal. Pupils are equal, round, and reactive to light. Right eye exhibits no discharge. Left eye exhibits no discharge.  Neck: Normal range of motion. Neck supple. No JVD present. No tracheal deviation present. No thyromegaly present.  Cardiovascular: Normal rate, regular rhythm and normal heart sounds.   Pulmonary/Chest: No stridor. No respiratory distress. She has no wheezes.  Abdominal: Soft. Bowel sounds are normal. She exhibits no distension and no mass. There is no tenderness. There is no rebound and no guarding.  Musculoskeletal: She exhibits tenderness (L knee is warm and swollen). She exhibits no edema.  Lymphadenopathy:    She has no cervical adenopathy.  Neurological: She displays normal reflexes. No cranial nerve deficit. She exhibits normal muscle tone. Coordination normal.  Skin: No rash noted. No erythema.  Psychiatric: She has a normal mood and affect. Her behavior is normal. Judgment and thought content normal.  Not suicidal or homicidal  Cane   Lab Results  Component Value Date   WBC 10.4 01/25/2012   HGB 9.0* 01/25/2012   HCT 26.1* 01/25/2012   PLT 177 01/25/2012   GLUCOSE 91 05/20/2012   CHOL 212* 11/21/2011   TRIG 140.0 11/21/2011    HDL 63.40 11/21/2011   LDLDIRECT 119.2 11/21/2011   LDLCALC 101* 09/02/2009   ALT 11 01/23/2012   AST 21 01/23/2012   NA 139 05/20/2012   K 3.5 05/20/2012   CL 103 05/20/2012   CREATININE 0.9 05/20/2012   BUN 10 05/20/2012   CO2 29 05/20/2012   TSH 1.25 03/28/2011   INR 1.44 01/25/2012   HGBA1C 6.3 05/20/2012        Assessment & Plan:

## 2012-07-31 NOTE — Assessment & Plan Note (Signed)
Continue with current prescription therapy as reflected on the Med list.  

## 2012-07-31 NOTE — Assessment & Plan Note (Signed)
Continue with current prescription therapy as reflected on the Med list. No CP 

## 2012-07-31 NOTE — Assessment & Plan Note (Signed)
Labs Wt loss 

## 2012-08-04 ENCOUNTER — Ambulatory Visit: Payer: Medicare PPO | Admitting: Internal Medicine

## 2012-09-21 ENCOUNTER — Other Ambulatory Visit: Payer: Self-pay | Admitting: Internal Medicine

## 2012-09-26 ENCOUNTER — Other Ambulatory Visit: Payer: Self-pay | Admitting: Internal Medicine

## 2012-11-26 ENCOUNTER — Other Ambulatory Visit (INDEPENDENT_AMBULATORY_CARE_PROVIDER_SITE_OTHER): Payer: Medicare PPO

## 2012-11-26 DIAGNOSIS — I2510 Atherosclerotic heart disease of native coronary artery without angina pectoris: Secondary | ICD-10-CM

## 2012-11-26 DIAGNOSIS — I10 Essential (primary) hypertension: Secondary | ICD-10-CM

## 2012-11-26 DIAGNOSIS — F329 Major depressive disorder, single episode, unspecified: Secondary | ICD-10-CM

## 2012-11-26 DIAGNOSIS — R7309 Other abnormal glucose: Secondary | ICD-10-CM

## 2012-11-26 LAB — HEPATIC FUNCTION PANEL
ALT: 15 U/L (ref 0–35)
AST: 22 U/L (ref 0–37)
Albumin: 4.2 g/dL (ref 3.5–5.2)
Alkaline Phosphatase: 67 U/L (ref 39–117)
Bilirubin, Direct: 0.1 mg/dL (ref 0.0–0.3)
Total Bilirubin: 0.7 mg/dL (ref 0.3–1.2)
Total Protein: 7.4 g/dL (ref 6.0–8.3)

## 2012-11-26 LAB — BASIC METABOLIC PANEL
BUN: 13 mg/dL (ref 6–23)
Chloride: 103 mEq/L (ref 96–112)
Potassium: 3.7 mEq/L (ref 3.5–5.1)

## 2012-11-26 LAB — TSH: TSH: 2.58 u[IU]/mL (ref 0.35–5.50)

## 2012-11-26 LAB — LIPID PANEL
Cholesterol: 304 mg/dL — ABNORMAL HIGH (ref 0–200)
HDL: 58.3 mg/dL
Total CHOL/HDL Ratio: 5
Triglycerides: 251 mg/dL — ABNORMAL HIGH (ref 0.0–149.0)
VLDL: 50.2 mg/dL — ABNORMAL HIGH (ref 0.0–40.0)

## 2012-11-26 LAB — HEMOGLOBIN A1C: Hgb A1c MFr Bld: 6 % (ref 4.6–6.5)

## 2012-12-03 ENCOUNTER — Encounter: Payer: Self-pay | Admitting: Internal Medicine

## 2012-12-03 ENCOUNTER — Ambulatory Visit (INDEPENDENT_AMBULATORY_CARE_PROVIDER_SITE_OTHER): Payer: Medicare PPO | Admitting: Internal Medicine

## 2012-12-03 VITALS — BP 132/82 | HR 80 | Temp 98.6°F | Resp 16 | Wt 178.0 lb

## 2012-12-03 DIAGNOSIS — I2510 Atherosclerotic heart disease of native coronary artery without angina pectoris: Secondary | ICD-10-CM

## 2012-12-03 DIAGNOSIS — Z23 Encounter for immunization: Secondary | ICD-10-CM

## 2012-12-03 DIAGNOSIS — I10 Essential (primary) hypertension: Secondary | ICD-10-CM

## 2012-12-03 DIAGNOSIS — E785 Hyperlipidemia, unspecified: Secondary | ICD-10-CM

## 2012-12-03 DIAGNOSIS — R7309 Other abnormal glucose: Secondary | ICD-10-CM

## 2012-12-03 DIAGNOSIS — K219 Gastro-esophageal reflux disease without esophagitis: Secondary | ICD-10-CM

## 2012-12-03 DIAGNOSIS — F329 Major depressive disorder, single episode, unspecified: Secondary | ICD-10-CM

## 2012-12-03 MED ORDER — ACYCLOVIR 400 MG PO TABS: 400.0000 mg | ORAL_TABLET | Freq: Three times a day (TID) | ORAL | Status: DC

## 2012-12-03 MED ORDER — HYDROCODONE-ACETAMINOPHEN 10-325 MG PO TABS: ORAL_TABLET | ORAL | Status: DC

## 2012-12-03 MED ORDER — RANITIDINE HCL 150 MG PO TABS: 150.0000 mg | ORAL_TABLET | Freq: Two times a day (BID) | ORAL | Status: DC

## 2012-12-03 MED ORDER — TRIAMCINOLONE ACETONIDE 0.5 % EX CREA: TOPICAL_CREAM | Freq: Three times a day (TID) | CUTANEOUS | Status: DC

## 2012-12-03 NOTE — Assessment & Plan Note (Signed)
Labs Loose wt 

## 2012-12-03 NOTE — Progress Notes (Signed)
Subjective:     HPI    F/u s/p L TKR on 01/23/12 - she is better... She is retired  The patient presents for a follow-up of  chronic CAD, hypertension, chronic dyslipidemia, OA controlled with medicines, stable F/u depression, anxiety - better on Rx  Past Medical History  Diagnosis Date  . History of pneumonia 2008  . Hyperlipemia   . Osteoarthritis   . MI (myocardial infarction)     DR Tenny Craw  . HTN (hypertension)     dr Posey Rea  . CAD (coronary artery disease)     DR Tenny Craw; by notes normal cath in 1986, normal stress 2013  . Family history of anesthesia complication     NIENCE had sensitivity  . Shortness of breath    Past Surgical History  Procedure Laterality Date  . Tonsillectomy    . Partial hysterectomy    . Knee arthroscopy      left  . Total knee arthroplasty  01/23/2012    Procedure: TOTAL KNEE ARTHROPLASTY;  Surgeon: Loreta Ave, MD;  Location: [facility] OR;  Service: Orthopedics;  Laterality: Left;  . Total knee arthroplasty  12/'05/2011    left knee  . Joint replacement  12/13    L TKR     reports that she has never smoked. She has never used smokeless tobacco. She reports that she does not drink alcohol or use illicit drugs. family history includes Glaucoma in her father; Hypertension in her mother and another family member. Allergies  Allergen Reactions  . Codeine Other (See Comments)    Abnormal behavior  . Gabapentin     REACTION: nausea  . Ibuprofen Nausea And Vomiting  . Simvastatin     REACTION: achy  . Tylenol [Acetaminophen]   . Iodine Swelling and Rash   Current Outpatient Prescriptions on File Prior to Visit  Medication Sig Dispense Refill  . amLODipine (NORVASC) 5 MG tablet take 1 tablet by mouth once daily  30 tablet  5  . cholecalciferol (VITAMIN D) 400 UNITS TABS Take 400 Units by mouth daily.      Marland Kitchen enoxaparin (LOVENOX) 30 MG/0.3ML injection Inject 0.3 mLs (30 mg total) into the skin every 12 (twelve) hours.  0 Syringe    .  escitalopram (LEXAPRO) 10 MG tablet take 1 tablet by mouth once daily  30 tablet  5  . escitalopram (LEXAPRO) 10 MG tablet take 1 tablet by mouth once daily  30 tablet  5  . gabapentin (NEURONTIN) 100 MG capsule take 1 capsule by mouth three times a day  90 capsule  5  . HYDROcodone-acetaminophen (NORCO) 10-325 MG per tablet take 1 to 2 tablets by mouth every 8 hours if needed for Breakthrough pain  100 tablet  0  . ibuprofen (ADVIL,MOTRIN) 600 MG tablet Take twice a day x 2-3 weeks, then prn pain  60 tablet  3  . methocarbamol (ROBAXIN) 500 MG tablet Take 1 tablet (500 mg total) by mouth every 6 (six) hours as needed (spasms).      . metoprolol succinate (TOPROL-XL) 50 MG 24 hr tablet Take 50 mg by mouth at bedtime. Take at hs      . rosuvastatin (CRESTOR) 5 MG tablet Take 5 mg by mouth 2 (two) times a week.      . temazepam (RESTORIL) 30 MG capsule Take 1 capsule (30 mg total) by mouth at bedtime as needed for sleep.  30 capsule  5  . triamcinolone cream (KENALOG) 0.5 % Apply  topically 3 (three) times daily. rash  60 g  1  . triamterene-hydrochlorothiazide (MAXZIDE-25) 37.5-25 MG per tablet take 1 tablet by mouth once daily  30 tablet  11  . vitamin B-12 (CYANOCOBALAMIN) 100 MCG tablet Take 100 mcg by mouth daily.      Marland Kitchen DEXILANT 60 MG capsule take 1 capsule by mouth once daily  90 capsule  3   No current facility-administered medications on file prior to visit.   BP 132/82  Pulse 80  Temp(Src) 98.6 F (37 C) (Oral)  Resp 16  Wt 178 lb (80.74 kg)  BMI 31.54 kg/m2    Wt Readings from Last 3 Encounters:  12/03/12 178 lb (80.74 kg)  07/31/12 174 lb (78.926 kg)  05/20/12 168 lb (76.204 kg)   BP Readings from Last 3 Encounters:  12/03/12 132/82  07/31/12 158/90  05/20/12 148/60      Review of Systems  Constitutional: Negative for chills, activity change, appetite change and unexpected weight change.  HENT: Negative for mouth sores and sinus pressure.   Eyes: Negative for visual  disturbance.  Respiratory: Negative for chest tightness.   Gastrointestinal: Negative for nausea and abdominal pain.  Genitourinary: Negative for frequency, difficulty urinating and vaginal pain.  Musculoskeletal: Positive for arthralgias, back pain, gait problem and myalgias.  Skin: Negative for pallor and wound.  Neurological: Negative for dizziness, tremors, weakness and headaches.  Psychiatric/Behavioral: Positive for sleep disturbance. Negative for suicidal ideas, behavioral problems, confusion and decreased concentration. The patient is nervous/anxious.        Objective:   Physical Exam  Constitutional: She appears well-developed. No distress.  HENT:  Head: Normocephalic.  Right Ear: External ear normal.  Left Ear: External ear normal.  Nose: Nose normal.  Mouth/Throat: Oropharynx is clear and moist.  Eyes: Conjunctivae are normal. Pupils are equal, round, and reactive to light. Right eye exhibits no discharge. Left eye exhibits no discharge.  Neck: Normal range of motion. Neck supple. No JVD present. No tracheal deviation present. No thyromegaly present.  Cardiovascular: Normal rate, regular rhythm and normal heart sounds.   Pulmonary/Chest: No stridor. No respiratory distress. She has no wheezes.  Abdominal: Soft. Bowel sounds are normal. She exhibits no distension and no mass. There is no tenderness. There is no rebound and no guarding.  Musculoskeletal: She exhibits tenderness (L knee is warm and swollen). She exhibits no edema.  Lymphadenopathy:    She has no cervical adenopathy.  Neurological: She displays normal reflexes. No cranial nerve deficit. She exhibits normal muscle tone. Coordination normal.  Skin: No rash noted. No erythema.  Psychiatric: She has a normal mood and affect. Her behavior is normal. Judgment and thought content normal.  Not suicidal or homicidal  Cane   Lab Results  Component Value Date   WBC 10.4 01/25/2012   HGB 9.0* 01/25/2012   HCT 26.1*  01/25/2012   PLT 177 01/25/2012   GLUCOSE 124* 11/26/2012   CHOL 304* 11/26/2012   TRIG 251.0* 11/26/2012   HDL 58.30 11/26/2012   LDLDIRECT 184.3 11/26/2012   LDLCALC 101* 09/02/2009   ALT 15 11/26/2012   AST 22 11/26/2012   NA 141 11/26/2012   K 3.7 11/26/2012   CL 103 11/26/2012   CREATININE 0.9 11/26/2012   BUN 13 11/26/2012   CO2 27 11/26/2012   TSH 2.58 11/26/2012   INR 1.44 01/25/2012   HGBA1C 6.0 11/26/2012        Assessment & Plan:

## 2012-12-03 NOTE — Assessment & Plan Note (Signed)
Better  

## 2012-12-03 NOTE — Patient Instructions (Signed)
Gluten free trial (no wheat products) for 4-6 weeks. OK to use gluten-free bread and gluten-free pasta.  Milk free trial (no milk, ice cream, cheese and yogurt) for 4-6 weeks. OK to use almond, coconut, rice or soy milk. "Almond breeze" brand tastes good.  

## 2012-12-03 NOTE — Assessment & Plan Note (Signed)
Statin intolerant 

## 2012-12-03 NOTE — Assessment & Plan Note (Signed)
Continue with current prescription therapy as reflected on the Med list.  

## 2012-12-03 NOTE — Assessment & Plan Note (Signed)
Myalgias from the statins - unable to tol Crestor either Diet

## 2012-12-03 NOTE — Assessment & Plan Note (Signed)
Start Zantac 

## 2012-12-19 ENCOUNTER — Other Ambulatory Visit: Payer: Self-pay | Admitting: *Deleted

## 2012-12-19 MED ORDER — METOPROLOL SUCCINATE ER 50 MG PO TB24: 50.0000 mg | ORAL_TABLET | Freq: Every day | ORAL | Status: DC

## 2013-01-21 ENCOUNTER — Other Ambulatory Visit: Payer: Self-pay | Admitting: Internal Medicine

## 2013-01-21 ENCOUNTER — Other Ambulatory Visit: Payer: Self-pay | Admitting: *Deleted

## 2013-01-21 MED ORDER — AMLODIPINE BESYLATE 5 MG PO TABS: 5.0000 mg | ORAL_TABLET | Freq: Every day | ORAL | Status: DC

## 2013-02-03 ENCOUNTER — Telehealth: Payer: Self-pay | Admitting: *Deleted

## 2013-02-03 NOTE — Telephone Encounter (Signed)
Error

## 2013-02-18 ENCOUNTER — Other Ambulatory Visit: Payer: Self-pay | Admitting: *Deleted

## 2013-02-18 MED ORDER — GABAPENTIN 100 MG PO CAPS: 100.0000 mg | ORAL_CAPSULE | Freq: Three times a day (TID) | ORAL | Status: DC

## 2013-02-20 ENCOUNTER — Other Ambulatory Visit: Payer: Self-pay | Admitting: Internal Medicine

## 2013-03-21 ENCOUNTER — Other Ambulatory Visit: Payer: Self-pay | Admitting: Internal Medicine

## 2013-03-24 ENCOUNTER — Other Ambulatory Visit (INDEPENDENT_AMBULATORY_CARE_PROVIDER_SITE_OTHER): Payer: Medicare PPO

## 2013-03-24 DIAGNOSIS — K219 Gastro-esophageal reflux disease without esophagitis: Secondary | ICD-10-CM

## 2013-03-24 DIAGNOSIS — F32A Depression, unspecified: Secondary | ICD-10-CM

## 2013-03-24 DIAGNOSIS — F329 Major depressive disorder, single episode, unspecified: Secondary | ICD-10-CM

## 2013-03-24 DIAGNOSIS — I2510 Atherosclerotic heart disease of native coronary artery without angina pectoris: Secondary | ICD-10-CM

## 2013-03-24 DIAGNOSIS — I10 Essential (primary) hypertension: Secondary | ICD-10-CM

## 2013-03-24 DIAGNOSIS — F3289 Other specified depressive episodes: Secondary | ICD-10-CM

## 2013-03-24 DIAGNOSIS — E785 Hyperlipidemia, unspecified: Secondary | ICD-10-CM

## 2013-03-24 DIAGNOSIS — R7309 Other abnormal glucose: Secondary | ICD-10-CM

## 2013-03-24 LAB — BASIC METABOLIC PANEL
BUN: 11 mg/dL (ref 6–23)
CO2: 28 mEq/L (ref 19–32)
CREATININE: 1 mg/dL (ref 0.4–1.2)
Calcium: 10 mg/dL (ref 8.4–10.5)
Chloride: 104 mEq/L (ref 96–112)
GFR: 72.47 mL/min (ref >60.00)
Glucose, Bld: 107 mg/dL — ABNORMAL HIGH (ref 70–99)
Potassium: 3.8 mEq/L (ref 3.5–5.1)
Sodium: 139 mEq/L (ref 135–145)

## 2013-03-24 LAB — LDL CHOLESTEROL, DIRECT: LDL DIRECT: 186.1 mg/dL

## 2013-03-24 LAB — LIPID PANEL
CHOL/HDL RATIO: 5
Cholesterol: 288 mg/dL — ABNORMAL HIGH (ref 0–200)
HDL: 58.5 mg/dL (ref >39.00)
TRIGLYCERIDES: 184 mg/dL — AB (ref 0.0–149.0)
VLDL: 36.8 mg/dL (ref 0.0–40.0)

## 2013-03-24 LAB — HEMOGLOBIN A1C: HEMOGLOBIN A1C: 6 % (ref 4.6–6.5)

## 2013-04-08 ENCOUNTER — Telehealth: Payer: Self-pay | Admitting: *Deleted

## 2013-04-08 ENCOUNTER — Ambulatory Visit: Payer: Medicare PPO | Admitting: Internal Medicine

## 2013-04-08 NOTE — Telephone Encounter (Signed)
ToRoma Schanz: [place] Fax: [phone] From: [facility] Date/ Time: 04/06/2013 4:42 PM Taken By: Jethro BolusallerOkey Regal: [place] Facility: home Patient: Terri SkainsCheston, Terri DOB: 1934-03-30 Phone: [phone] Reason for Call: Pt has an appointment scheduled for 04/08/2013 at 10:15am that she is wanting to reschedule. Regarding Appointment: Appt Date: Appt Time: Unknown Provider: Reason: Details:

## 2013-04-08 NOTE — Telephone Encounter (Signed)
ToRoma Schanz: [place] Fax: [phone] From: [facility] Date/ Time: 04/07/2013 12:20 PM Taken By: Annalee GentaPatricia Carter, CSR Caller: Okey Regalarol Facility: not collected Patient: Terri SkainsCheston, Terri DOB: 21-Jun-1934 Phone: [phone] Reason for Call: See info below Regarding Appointment: Yes Appt Date: 04/08/2013 Appt Time: 10:15:00 AM Provider: Sonda PrimesPlotnikov, Alex (Adults only) Reason: Cancel Appointment Details: Pt will call office to reschedule when weather clears. Outcome: Cancelled appointment in [facility]

## 2013-04-20 ENCOUNTER — Ambulatory Visit (INDEPENDENT_AMBULATORY_CARE_PROVIDER_SITE_OTHER): Payer: Medicare HMO | Admitting: Internal Medicine

## 2013-04-20 ENCOUNTER — Encounter: Payer: Self-pay | Admitting: Internal Medicine

## 2013-04-20 VITALS — BP 140/92 | HR 79 | Temp 98.8°F | Wt 183.0 lb

## 2013-04-20 DIAGNOSIS — M1990 Unspecified osteoarthritis, unspecified site: Secondary | ICD-10-CM

## 2013-04-20 DIAGNOSIS — E785 Hyperlipidemia, unspecified: Secondary | ICD-10-CM

## 2013-04-20 DIAGNOSIS — Z23 Encounter for immunization: Secondary | ICD-10-CM

## 2013-04-20 DIAGNOSIS — Z2911 Encounter for prophylactic immunotherapy for respiratory syncytial virus (RSV): Secondary | ICD-10-CM

## 2013-04-20 DIAGNOSIS — I10 Essential (primary) hypertension: Secondary | ICD-10-CM

## 2013-04-20 DIAGNOSIS — I2510 Atherosclerotic heart disease of native coronary artery without angina pectoris: Secondary | ICD-10-CM

## 2013-04-20 MED ORDER — HYDROCODONE-ACETAMINOPHEN 10-325 MG PO TABS: ORAL_TABLET | ORAL | Status: DC

## 2013-04-20 MED ORDER — TRIAMCINOLONE ACETONIDE 0.5 % EX CREA: TOPICAL_CREAM | Freq: Three times a day (TID) | CUTANEOUS | Status: AC

## 2013-04-20 MED ORDER — TEMAZEPAM 30 MG PO CAPS: ORAL_CAPSULE | ORAL | Status: DC

## 2013-04-20 NOTE — Assessment & Plan Note (Signed)
Continue with current prescription therapy as reflected on the Med list.  

## 2013-04-20 NOTE — Progress Notes (Signed)
Patient ID: Terri GoldenCarol L Hampshire, female   DOB: September 28, 1934, 79 y.o.   MRN: [id number]   Subjective:     HPI    F/u s/p L TKR on 01/23/12 - she is better... She is retired  The patient presents for a follow-up of  chronic CAD, hypertension, chronic dyslipidemia, OA controlled with medicines, stable F/u depression, anxiety - better on Rx  Past Medical History  Diagnosis Date  . History of pneumonia 2008  . Hyperlipemia   . Osteoarthritis   . MI (myocardial infarction)     DR Tenny Crawoss  . HTN (hypertension)     dr Posey Reaplotnikov  . CAD (coronary artery disease)     DR Tenny Crawoss; by notes normal cath in 1986, normal stress 2013  . Family history of anesthesia complication     NIENCE had sensitivity  . Shortness of breath    Past Surgical History  Procedure Laterality Date  . Tonsillectomy    . Partial hysterectomy    . Knee arthroscopy      left  . Total knee arthroplasty  01/23/2012    Procedure: TOTAL KNEE ARTHROPLASTY;  Surgeon: Loreta Aveaniel F Murphy, MD;  [facility];  Service: Orthopedics;  Laterality: Left;  . Total knee arthroplasty  12/'05/2011    left knee  . Joint replacement  12/13    L TKR     reports that she has never smoked. She has never used smokeless tobacco. She reports that she does not drink alcohol or use illicit drugs. family history includes Glaucoma in her father; Hypertension in her mother and another family member. Allergies  Allergen Reactions  . Codeine Other (See Comments)    Abnormal behavior  . Crestor [Rosuvastatin]     Myalgias   . Dexilant [Dexlansoprazole]     Made me sick  . Gabapentin     REACTION: nausea  . Ibuprofen Nausea And Vomiting  . Simvastatin     REACTION: achy  . Tylenol [Acetaminophen]   . Iodine Swelling and Rash   Current Outpatient Prescriptions on File Prior to Visit  Medication Sig Dispense Refill  . acyclovir (ZOVIRAX) 400 MG tablet Take 1 tablet (400 mg total) by mouth 3 (three) times daily.  21 tablet  3  . amLODipine  (NORVASC) 5 MG tablet Take 1 tablet (5 mg total) by mouth daily.  30 tablet  5  . cholecalciferol (VITAMIN D) 400 UNITS TABS Take 400 Units by mouth daily.      . CRESTOR 5 MG tablet take 1 tablet by mouth once daily  30 tablet  11  . escitalopram (LEXAPRO) 10 MG tablet take 1 tablet by mouth once daily  30 tablet  5  . gabapentin (NEURONTIN) 100 MG capsule Take 1 capsule (100 mg total) by mouth 3 (three) times daily.  90 capsule  5  . HYDROcodone-acetaminophen (NORCO) 10-325 MG per tablet take 1 to 2 tablets by mouth every 8 hours if needed for  pain  100 tablet  0  . ibuprofen (ADVIL,MOTRIN) 600 MG tablet Take twice a day x 2-3 weeks, then prn pain  60 tablet  3  . methocarbamol (ROBAXIN) 500 MG tablet Take 1 tablet (500 mg total) by mouth every 6 (six) hours as needed (spasms).      . metoprolol succinate (TOPROL-XL) 50 MG 24 hr tablet Take 1 tablet (50 mg total) by mouth at bedtime.  30 tablet  5  . ranitidine (ZANTAC) 150 MG tablet Take 1 tablet (150 mg total)  by mouth 2 (two) times daily.  180 tablet  3  . temazepam (RESTORIL) 30 MG capsule take 1 capsule by mouth at bedtime if needed for sleep  30 capsule  5  . triamcinolone cream (KENALOG) 0.5 % Apply topically 3 (three) times daily. rash  60 g  1  . triamterene-hydrochlorothiazide (MAXZIDE-25) 37.5-25 MG per tablet take 1 tablet by mouth once daily  30 tablet  11  . vitamin B-12 (CYANOCOBALAMIN) 100 MCG tablet Take 100 mcg by mouth daily.      Marland Kitchen enoxaparin (LOVENOX) 30 MG/0.3ML injection Inject 0.3 mLs (30 mg total) into the skin every 12 (twelve) hours.  0 Syringe     No current facility-administered medications on file prior to visit.   BP 140/92  Pulse 79  Temp(Src) 98.8 F (37.1 C) (Oral)  Wt 183 lb (83.008 kg)    Wt Readings from Last 3 Encounters:  04/20/13 183 lb (83.008 kg)  12/03/12 178 lb (80.74 kg)  07/31/12 174 lb (78.926 kg)   BP Readings from Last 3 Encounters:  04/20/13 140/92  12/03/12 132/82  07/31/12  158/90      Review of Systems  Constitutional: Negative for chills, activity change, appetite change and unexpected weight change.  HENT: Negative for mouth sores and sinus pressure.   Eyes: Negative for visual disturbance.  Respiratory: Negative for chest tightness.   Gastrointestinal: Negative for nausea and abdominal pain.  Genitourinary: Negative for frequency, difficulty urinating and vaginal pain.  Musculoskeletal: Positive for arthralgias, back pain, gait problem and myalgias.  Skin: Negative for pallor and wound.  Neurological: Negative for dizziness, tremors, weakness and headaches.  Psychiatric/Behavioral: Positive for sleep disturbance. Negative for suicidal ideas, behavioral problems, confusion and decreased concentration. The patient is nervous/anxious.        Objective:   Physical Exam  Constitutional: She appears well-developed. No distress.  HENT:  Head: Normocephalic.  Right Ear: External ear normal.  Left Ear: External ear normal.  Nose: Nose normal.  Mouth/Throat: Oropharynx is clear and moist.  Eyes: Conjunctivae are normal. Pupils are equal, round, and reactive to light. Right eye exhibits no discharge. Left eye exhibits no discharge.  Neck: Normal range of motion. Neck supple. No JVD present. No tracheal deviation present. No thyromegaly present.  Cardiovascular: Normal rate, regular rhythm and normal heart sounds.   Pulmonary/Chest: No stridor. No respiratory distress. She has no wheezes.  Abdominal: Soft. Bowel sounds are normal. She exhibits no distension and no mass. There is no tenderness. There is no rebound and no guarding.  Musculoskeletal: She exhibits tenderness (L knee is warm and swollen). She exhibits no edema.  Lymphadenopathy:    She has no cervical adenopathy.  Neurological: She displays normal reflexes. No cranial nerve deficit. She exhibits normal muscle tone. Coordination normal.  Skin: No rash noted. No erythema.  Psychiatric: She has a  normal mood and affect. Her behavior is normal. Judgment and thought content normal.  Not suicidal or homicidal     Lab Results  Component Value Date   WBC 10.4 01/25/2012   HGB 9.0* 01/25/2012   HCT 26.1* 01/25/2012   PLT 177 01/25/2012   GLUCOSE 107* 03/24/2013   CHOL 288* 03/24/2013   TRIG 184.0* 03/24/2013   HDL 58.50 03/24/2013   LDLDIRECT 186.1 03/24/2013   LDLCALC 101* 09/02/2009   ALT 15 11/26/2012   AST 22 11/26/2012   NA 139 03/24/2013   K 3.8 03/24/2013   CL 104 03/24/2013   CREATININE 1.0 03/24/2013  BUN 11 03/24/2013   CO2 28 03/24/2013   TSH 2.58 11/26/2012   INR 1.44 01/25/2012   HGBA1C 6.0 03/24/2013        Assessment & Plan:

## 2013-04-20 NOTE — Progress Notes (Signed)
Pre visit review using our clinic review tool, if applicable. No additional management support is needed unless otherwise documented below in the visit note. 

## 2013-04-20 NOTE — Assessment & Plan Note (Signed)
D/c Crestor if cont to have myalgia

## 2013-04-20 NOTE — Assessment & Plan Note (Signed)
Continue with current prescription prn therapy as reflected on the Med list.  

## 2013-06-24 ENCOUNTER — Other Ambulatory Visit: Payer: Self-pay | Admitting: *Deleted

## 2013-06-24 MED ORDER — METOPROLOL SUCCINATE ER 50 MG PO TB24: 50.0000 mg | ORAL_TABLET | Freq: Every day | ORAL | Status: DC

## 2013-07-20 ENCOUNTER — Other Ambulatory Visit: Payer: Self-pay | Admitting: Internal Medicine

## 2013-07-22 ENCOUNTER — Encounter: Payer: Self-pay | Admitting: Internal Medicine

## 2013-07-22 ENCOUNTER — Ambulatory Visit (INDEPENDENT_AMBULATORY_CARE_PROVIDER_SITE_OTHER): Payer: Commercial Managed Care - HMO | Admitting: Internal Medicine

## 2013-07-22 VITALS — BP 144/88 | HR 76 | Temp 97.7°F | Ht 64.0 in | Wt 189.0 lb

## 2013-07-22 DIAGNOSIS — M2550 Pain in unspecified joint: Secondary | ICD-10-CM

## 2013-07-22 DIAGNOSIS — I2510 Atherosclerotic heart disease of native coronary artery without angina pectoris: Secondary | ICD-10-CM

## 2013-07-22 DIAGNOSIS — E785 Hyperlipidemia, unspecified: Secondary | ICD-10-CM

## 2013-07-22 DIAGNOSIS — M1990 Unspecified osteoarthritis, unspecified site: Secondary | ICD-10-CM

## 2013-07-22 DIAGNOSIS — I10 Essential (primary) hypertension: Secondary | ICD-10-CM

## 2013-07-22 MED ORDER — HYDROCODONE-ACETAMINOPHEN 10-325 MG PO TABS: ORAL_TABLET | ORAL | Status: DC

## 2013-07-22 MED ORDER — MAXIMUM RED KRILL 300 MG PO CAPS: ORAL_CAPSULE | ORAL | Status: AC

## 2013-07-22 NOTE — Assessment & Plan Note (Signed)
Try krill oil She had to stop Crestor

## 2013-07-22 NOTE — Assessment & Plan Note (Signed)
Try krill oil

## 2013-07-22 NOTE — Assessment & Plan Note (Signed)
Continue with current prescription therapy as reflected on the Med list. Better off Cestor

## 2013-07-22 NOTE — Assessment & Plan Note (Signed)
Continue with current prescription therapy as reflected on the Med list.  

## 2013-07-22 NOTE — Progress Notes (Signed)
Pre visit review using our clinic review tool, if applicable. No additional management support is needed unless otherwise documented below in the visit note. 

## 2013-07-22 NOTE — Assessment & Plan Note (Signed)
Continue with current prn prescription therapy as reflected on the Med list.  

## 2013-07-22 NOTE — Progress Notes (Signed)
Subjective:     HPI    F/u s/p L TKR on 01/23/12 - she is better... She is retired  The patient presents for a follow-up of  chronic CAD, hypertension, chronic dyslipidemia, OA controlled with medicines, stable F/u depression, anxiety - better on Rx  Past Medical History  Diagnosis Date  . History of pneumonia 2008  . Hyperlipemia   . Osteoarthritis   . MI (myocardial infarction)     DR Tenny Craw  . HTN (hypertension)     dr Posey Rea  . CAD (coronary artery disease)     DR Tenny Craw; by notes normal cath in 1986, normal stress 2013  . Family history of anesthesia complication     NIENCE had sensitivity  . Shortness of breath    Past Surgical History  Procedure Laterality Date  . Tonsillectomy    . Partial hysterectomy    . Knee arthroscopy      left  . Total knee arthroplasty  01/23/2012    Procedure: TOTAL KNEE ARTHROPLASTY;  Surgeon: Loreta Ave, MD;  Location: [facility] OR;  Service: Orthopedics;  Laterality: Left;  . Total knee arthroplasty  12/'05/2011    left knee  . Joint replacement  12/13    L TKR     reports that she has never smoked. She has never used smokeless tobacco. She reports that she does not drink alcohol or use illicit drugs. family history includes Glaucoma in her father; Hypertension in her mother and another family member. Allergies  Allergen Reactions  . Codeine Other (See Comments)    Abnormal behavior  . Crestor [Rosuvastatin]     Myalgias   . Dexilant [Dexlansoprazole]     Made me sick  . Gabapentin     REACTION: nausea  . Ibuprofen Nausea And Vomiting  . Simvastatin     REACTION: achy  . Tylenol [Acetaminophen]   . Iodine Swelling and Rash   Current Outpatient Prescriptions on File Prior to Visit  Medication Sig Dispense Refill  . acyclovir (ZOVIRAX) 400 MG tablet Take 1 tablet (400 mg total) by mouth 3 (three) times daily.  21 tablet  3  . amLODipine (NORVASC) 5 MG tablet take 1 tablet by mouth once daily  30 tablet  5  .  cholecalciferol (VITAMIN D) 400 UNITS TABS Take 400 Units by mouth daily.      . CRESTOR 5 MG tablet take 1 tablet by mouth once daily  30 tablet  11  . escitalopram (LEXAPRO) 10 MG tablet take 1 tablet by mouth once daily  30 tablet  5  . gabapentin (NEURONTIN) 100 MG capsule Take 1 capsule (100 mg total) by mouth 3 (three) times daily.  90 capsule  5  . HYDROcodone-acetaminophen (NORCO) 10-325 MG per tablet take 1 to 2 tablets by mouth every 8 hours if needed for  pain  100 tablet  0  . ibuprofen (ADVIL,MOTRIN) 600 MG tablet Take twice a day x 2-3 weeks, then prn pain  60 tablet  3  . methocarbamol (ROBAXIN) 500 MG tablet Take 1 tablet (500 mg total) by mouth every 6 (six) hours as needed (spasms).      . metoprolol succinate (TOPROL-XL) 50 MG 24 hr tablet Take 1 tablet (50 mg total) by mouth at bedtime.  30 tablet  5  . ranitidine (ZANTAC) 150 MG tablet Take 1 tablet (150 mg total) by mouth 2 (two) times daily.  180 tablet  3  . temazepam (RESTORIL) 30 MG capsule take  1 capsule by mouth at bedtime if needed for sleep  30 capsule  5  . triamcinolone cream (KENALOG) 0.5 % Apply topically 3 (three) times daily. rash  60 g  1  . triamterene-hydrochlorothiazide (MAXZIDE-25) 37.5-25 MG per tablet take 1 tablet by mouth once daily  30 tablet  11  . vitamin B-12 (CYANOCOBALAMIN) 100 MCG tablet Take 100 mcg by mouth daily.      Marland Kitchen. enoxaparin (LOVENOX) 30 MG/0.3ML injection Inject 0.3 mLs (30 mg total) into the skin every 12 (twelve) hours.  0 Syringe     No current facility-administered medications on file prior to visit.   BP 144/88  Pulse 76  Temp(Src) 97.7 F (36.5 C) (Oral)  Ht 5\' 4"  (1.626 m)  Wt 189 lb (85.73 kg)  BMI 32.43 kg/m2  SpO2 97%    Wt Readings from Last 3 Encounters:  07/22/13 189 lb (85.73 kg)  04/20/13 183 lb (83.008 kg)  12/03/12 178 lb (80.74 kg)   BP Readings from Last 3 Encounters:  07/22/13 144/88  04/20/13 140/92  12/03/12 132/82      Review of Systems   Constitutional: Negative for chills, activity change, appetite change and unexpected weight change.  HENT: Negative for mouth sores and sinus pressure.   Eyes: Negative for visual disturbance.  Respiratory: Negative for chest tightness.   Gastrointestinal: Negative for nausea and abdominal pain.  Genitourinary: Negative for frequency, difficulty urinating and vaginal pain.  Musculoskeletal: Positive for arthralgias, back pain, gait problem and myalgias.  Skin: Negative for pallor and wound.  Neurological: Negative for dizziness, tremors, weakness and headaches.  Psychiatric/Behavioral: Positive for sleep disturbance. Negative for suicidal ideas, behavioral problems, confusion and decreased concentration. The patient is nervous/anxious.        Objective:   Physical Exam  Constitutional: She appears well-developed. No distress.  HENT:  Head: Normocephalic.  Right Ear: External ear normal.  Left Ear: External ear normal.  Nose: Nose normal.  Mouth/Throat: Oropharynx is clear and moist.  Eyes: Conjunctivae are normal. Pupils are equal, round, and reactive to light. Right eye exhibits no discharge. Left eye exhibits no discharge.  Neck: Normal range of motion. Neck supple. No JVD present. No tracheal deviation present. No thyromegaly present.  Cardiovascular: Normal rate, regular rhythm and normal heart sounds.   Pulmonary/Chest: No stridor. No respiratory distress. She has no wheezes.  Abdominal: Soft. Bowel sounds are normal. She exhibits no distension and no mass. There is no tenderness. There is no rebound and no guarding.  Musculoskeletal: She exhibits tenderness (L knee is warm and swollen). She exhibits no edema.  Lymphadenopathy:    She has no cervical adenopathy.  Neurological: She displays normal reflexes. No cranial nerve deficit. She exhibits normal muscle tone. Coordination normal.  Skin: No rash noted. No erythema.  Psychiatric: She has a normal mood and affect. Her  behavior is normal. Judgment and thought content normal.  Not suicidal or homicidal     Lab Results  Component Value Date   WBC 10.4 01/25/2012   HGB 9.0* 01/25/2012   HCT 26.1* 01/25/2012   PLT 177 01/25/2012   GLUCOSE 107* 03/24/2013   CHOL 288* 03/24/2013   TRIG 184.0* 03/24/2013   HDL 58.50 03/24/2013   LDLDIRECT 186.1 03/24/2013   LDLCALC 101* 09/02/2009   ALT 15 11/26/2012   AST 22 11/26/2012   NA 139 03/24/2013   K 3.8 03/24/2013   CL 104 03/24/2013   CREATININE 1.0 03/24/2013   BUN 11 03/24/2013  CO2 28 03/24/2013   TSH 2.58 11/26/2012   INR 1.44 01/25/2012   HGBA1C 6.0 03/24/2013        Assessment & Plan:

## 2013-07-23 ENCOUNTER — Telehealth: Payer: Self-pay | Admitting: Internal Medicine

## 2013-07-23 NOTE — Telephone Encounter (Signed)
Relevant patient education mailed to patient.  

## 2013-07-28 ENCOUNTER — Encounter: Payer: Self-pay | Admitting: Internal Medicine

## 2013-08-24 ENCOUNTER — Other Ambulatory Visit: Payer: Commercial Managed Care - HMO

## 2013-08-24 ENCOUNTER — Encounter: Payer: Self-pay | Admitting: Internal Medicine

## 2013-08-24 ENCOUNTER — Ambulatory Visit (INDEPENDENT_AMBULATORY_CARE_PROVIDER_SITE_OTHER): Payer: Commercial Managed Care - HMO | Admitting: Internal Medicine

## 2013-08-24 VITALS — BP 130/70 | HR 76 | Temp 98.5°F | Resp 16 | Wt 183.0 lb

## 2013-08-24 DIAGNOSIS — N3000 Acute cystitis without hematuria: Secondary | ICD-10-CM

## 2013-08-24 DIAGNOSIS — N390 Urinary tract infection, site not specified: Secondary | ICD-10-CM | POA: Insufficient documentation

## 2013-08-24 MED ORDER — CIPROFLOXACIN HCL 250 MG PO TABS: 250.0000 mg | ORAL_TABLET | Freq: Two times a day (BID) | ORAL | Status: DC

## 2013-08-24 NOTE — Progress Notes (Signed)
Patient ID: Rennis GoldenCarol L Wilhite, female   DOB: 1935-01-21, 79 y.o.   MRN: [id number]   Subjective:     Urinary Frequency  This is a new problem. The current episode started in the past 7 days. The problem occurs every urination. The problem has been waxing and waning. The patient is experiencing no pain. Associated symptoms include chills, flank pain, frequency, sweats and urgency. Pertinent negatives include no nausea.      F/u s/p L TKR on 01/23/12 - she is better... She is retired  The patient presents for a follow-up of  chronic CAD, hypertension, chronic dyslipidemia, OA controlled with medicines, stable F/u depression, anxiety - better on Rx  Past Medical History  Diagnosis Date  . History of pneumonia 2008  . Hyperlipemia   . Osteoarthritis   . MI (myocardial infarction)     DR Tenny Crawoss  . HTN (hypertension)     dr Posey Reaplotnikov  . CAD (coronary artery disease)     DR Tenny Crawoss; by notes normal cath in 1986, normal stress 2013  . Family history of anesthesia complication     NIENCE had sensitivity  . Shortness of breath    Past Surgical History  Procedure Laterality Date  . Tonsillectomy    . Partial hysterectomy    . Knee arthroscopy      left  . Total knee arthroplasty  01/23/2012    Procedure: TOTAL KNEE ARTHROPLASTY;  Surgeon: Loreta Aveaniel F Murphy, MD;  [facility];  Service: Orthopedics;  Laterality: Left;  . Total knee arthroplasty  12/'05/2011    left knee  . Joint replacement  12/13    L TKR     reports that she has never smoked. She has never used smokeless tobacco. She reports that she does not drink alcohol or use illicit drugs. family history includes Glaucoma in her father; Hypertension in her mother and another family member. Allergies  Allergen Reactions  . Codeine Other (See Comments)    Abnormal behavior  . Crestor [Rosuvastatin]     Myalgias   . Dexilant [Dexlansoprazole]     Made me sick  . Gabapentin     REACTION: nausea  . Ibuprofen Nausea And Vomiting   . Simvastatin     REACTION: achy  . Tylenol [Acetaminophen]   . Iodine Swelling and Rash   Current Outpatient Prescriptions on File Prior to Visit  Medication Sig Dispense Refill  . acyclovir (ZOVIRAX) 400 MG tablet Take 1 tablet (400 mg total) by mouth 3 (three) times daily.  21 tablet  3  . amLODipine (NORVASC) 5 MG tablet take 1 tablet by mouth once daily  30 tablet  5  . cholecalciferol (VITAMIN D) 400 UNITS TABS Take 400 Units by mouth daily.      . CRESTOR 5 MG tablet take 1 tablet by mouth once daily  30 tablet  11  . escitalopram (LEXAPRO) 10 MG tablet take 1 tablet by mouth once daily  30 tablet  5  . gabapentin (NEURONTIN) 100 MG capsule Take 1 capsule (100 mg total) by mouth 3 (three) times daily.  90 capsule  5  . HYDROcodone-acetaminophen (NORCO) 10-325 MG per tablet take 1 to 2 tablets by mouth every 8 hours if needed for  pain  100 tablet  0  . ibuprofen (ADVIL,MOTRIN) 600 MG tablet Take twice a day x 2-3 weeks, then prn pain  60 tablet  3  . Krill Oil (MAXIMUM RED KRILL) 300 MG CAPS 1 po qd  100  capsule  3  . metoprolol succinate (TOPROL-XL) 50 MG 24 hr tablet Take 1 tablet (50 mg total) by mouth at bedtime.  30 tablet  5  . ranitidine (ZANTAC) 150 MG tablet Take 1 tablet (150 mg total) by mouth 2 (two) times daily.  180 tablet  3  . temazepam (RESTORIL) 30 MG capsule take 1 capsule by mouth at bedtime if needed for sleep  30 capsule  5  . triamcinolone cream (KENALOG) 0.5 % Apply topically 3 (three) times daily. rash  60 g  1  . triamterene-hydrochlorothiazide (MAXZIDE-25) 37.5-25 MG per tablet take 1 tablet by mouth once daily  30 tablet  11  . vitamin B-12 (CYANOCOBALAMIN) 100 MCG tablet Take 100 mcg by mouth daily.       No current facility-administered medications on file prior to visit.   BP 130/70  Pulse 76  Temp(Src) 98.5 F (36.9 C) (Oral)  Resp 16  Wt 183 lb (83.008 kg)    Wt Readings from Last 3 Encounters:  08/24/13 183 lb (83.008 kg)  07/22/13 189 lb  (85.73 kg)  04/20/13 183 lb (83.008 kg)   BP Readings from Last 3 Encounters:  08/24/13 130/70  07/22/13 144/88  04/20/13 140/92      Review of Systems  Constitutional: Positive for chills. Negative for activity change, appetite change and unexpected weight change.  HENT: Negative for mouth sores and sinus pressure.   Eyes: Negative for visual disturbance.  Respiratory: Negative for chest tightness.   Gastrointestinal: Negative for nausea and abdominal pain.  Genitourinary: Positive for urgency, frequency and flank pain. Negative for difficulty urinating and vaginal pain.  Musculoskeletal: Positive for arthralgias, back pain, gait problem and myalgias.  Skin: Negative for pallor and wound.  Neurological: Negative for dizziness, tremors, weakness and headaches.  Psychiatric/Behavioral: Positive for sleep disturbance. Negative for suicidal ideas, behavioral problems, confusion and decreased concentration. The patient is nervous/anxious.        Objective:   Physical Exam  Constitutional: She appears well-developed. No distress.  HENT:  Head: Normocephalic.  Right Ear: External ear normal.  Left Ear: External ear normal.  Nose: Nose normal.  Mouth/Throat: Oropharynx is clear and moist.  Eyes: Conjunctivae are normal. Pupils are equal, round, and reactive to light. Right eye exhibits no discharge. Left eye exhibits no discharge.  Neck: Normal range of motion. Neck supple. No JVD present. No tracheal deviation present. No thyromegaly present.  Cardiovascular: Normal rate, regular rhythm and normal heart sounds.   Pulmonary/Chest: No stridor. No respiratory distress. She has no wheezes.  Abdominal: Soft. Bowel sounds are normal. She exhibits no distension and no mass. There is no tenderness. There is no rebound and no guarding.  Musculoskeletal: She exhibits tenderness (L knee is warm and swollen). She exhibits no edema.  Lymphadenopathy:    She has no cervical adenopathy.   Neurological: She displays normal reflexes. No cranial nerve deficit. She exhibits normal muscle tone. Coordination normal.  Skin: No rash noted. No erythema.  Psychiatric: She has a normal mood and affect. Her behavior is normal. Judgment and thought content normal.  Not suicidal or homicidal  suprapubic area is sensitive    Lab Results  Component Value Date   WBC 10.4 01/25/2012   HGB 9.0* 01/25/2012   HCT 26.1* 01/25/2012   PLT 177 01/25/2012   GLUCOSE 107* 03/24/2013   CHOL 288* 03/24/2013   TRIG 184.0* 03/24/2013   HDL 58.50 03/24/2013   LDLDIRECT 186.1 03/24/2013   LDLCALC 101* 09/02/2009  ALT 15 11/26/2012   AST 22 11/26/2012   NA 139 03/24/2013   K 3.8 03/24/2013   CL 104 03/24/2013   CREATININE 1.0 03/24/2013   BUN 11 03/24/2013   CO2 28 03/24/2013   TSH 2.58 11/26/2012   INR 1.44 01/25/2012   HGBA1C 6.0 03/24/2013        Assessment & Plan:

## 2013-08-24 NOTE — Progress Notes (Signed)
Pre visit review using our clinic review tool, if applicable. No additional management support is needed unless otherwise documented below in the visit note. 

## 2013-08-24 NOTE — Assessment & Plan Note (Signed)
UA Cipro x 7 d

## 2013-09-14 ENCOUNTER — Other Ambulatory Visit: Payer: Self-pay | Admitting: Internal Medicine

## 2013-10-22 ENCOUNTER — Other Ambulatory Visit (INDEPENDENT_AMBULATORY_CARE_PROVIDER_SITE_OTHER): Payer: Commercial Managed Care - HMO

## 2013-10-22 ENCOUNTER — Other Ambulatory Visit: Payer: Self-pay

## 2013-10-22 ENCOUNTER — Encounter: Payer: Self-pay | Admitting: Internal Medicine

## 2013-10-22 ENCOUNTER — Ambulatory Visit (INDEPENDENT_AMBULATORY_CARE_PROVIDER_SITE_OTHER): Payer: Commercial Managed Care - HMO | Admitting: Internal Medicine

## 2013-10-22 VITALS — BP 148/84 | HR 76 | Temp 98.4°F | Resp 16 | Wt 183.0 lb

## 2013-10-22 DIAGNOSIS — Z23 Encounter for immunization: Secondary | ICD-10-CM

## 2013-10-22 DIAGNOSIS — M1990 Unspecified osteoarthritis, unspecified site: Secondary | ICD-10-CM

## 2013-10-22 DIAGNOSIS — I10 Essential (primary) hypertension: Secondary | ICD-10-CM

## 2013-10-22 DIAGNOSIS — N3000 Acute cystitis without hematuria: Secondary | ICD-10-CM

## 2013-10-22 DIAGNOSIS — G4700 Insomnia, unspecified: Secondary | ICD-10-CM

## 2013-10-22 LAB — URINALYSIS
BILIRUBIN URINE: NEGATIVE
HGB URINE DIPSTICK: NEGATIVE
KETONES UR: NEGATIVE
LEUKOCYTES UA: NEGATIVE
Nitrite: NEGATIVE
Specific Gravity, Urine: >=1.03 — AB (ref 1.000–1.030)
Urine Glucose: NEGATIVE
Urobilinogen, UA: 0.2 (ref 0.0–1.0)
pH: 6 (ref 5.0–8.0)

## 2013-10-22 MED ORDER — RANITIDINE HCL 150 MG PO TABS: 150.0000 mg | ORAL_TABLET | Freq: Two times a day (BID) | ORAL | Status: DC

## 2013-10-22 MED ORDER — HYDROCODONE-ACETAMINOPHEN 10-325 MG PO TABS: ORAL_TABLET | ORAL | Status: DC

## 2013-10-22 MED ORDER — TEMAZEPAM 30 MG PO CAPS: ORAL_CAPSULE | ORAL | Status: DC

## 2013-10-22 NOTE — Assessment & Plan Note (Signed)
B knees - chronic  Potential benefits of a long term opioids use as well as potential risks (i.e. addiction risk, apnea etc) and complications (i.e. Somnolence, constipation and others) were explained to the patient and were aknowledged.  Continue with current prn prescription therapy as reflected on the Med list.

## 2013-10-22 NOTE — Assessment & Plan Note (Addendum)
Continue with current prescription therapy as reflected on the Med list. Check BP at home  

## 2013-10-22 NOTE — Assessment & Plan Note (Signed)
Chronic  Potential benefits of a long term benzodiazepines  use as well as potential risks  and complications were explained to the patient and were aknowledged. 

## 2013-10-22 NOTE — Progress Notes (Signed)
Pre visit review using our clinic review tool, if applicable. No additional management support is needed unless otherwise documented below in the visit note. 

## 2014-01-17 ENCOUNTER — Other Ambulatory Visit: Payer: Self-pay | Admitting: Internal Medicine

## 2014-01-21 ENCOUNTER — Ambulatory Visit (INDEPENDENT_AMBULATORY_CARE_PROVIDER_SITE_OTHER): Payer: Commercial Managed Care - HMO | Admitting: Internal Medicine

## 2014-01-21 ENCOUNTER — Encounter: Payer: Self-pay | Admitting: Internal Medicine

## 2014-01-21 VITALS — BP 144/78 | HR 64 | Temp 98.1°F | Wt 187.0 lb

## 2014-01-21 DIAGNOSIS — I10 Essential (primary) hypertension: Secondary | ICD-10-CM

## 2014-01-21 DIAGNOSIS — M150 Primary generalized (osteo)arthritis: Secondary | ICD-10-CM

## 2014-01-21 DIAGNOSIS — M159 Polyosteoarthritis, unspecified: Secondary | ICD-10-CM

## 2014-01-21 DIAGNOSIS — F4323 Adjustment disorder with mixed anxiety and depressed mood: Secondary | ICD-10-CM

## 2014-01-21 MED ORDER — ALPRAZOLAM 0.25 MG PO TABS: 0.2500 mg | ORAL_TABLET | Freq: Two times a day (BID) | ORAL | Status: DC | PRN

## 2014-01-21 NOTE — Assessment & Plan Note (Signed)
Continue with current prescription therapy as reflected on the Med list.  Potential benefits of a long term benzodiazepines  use as well as potential risks  and complications were explained to the patient and were aknowledged.  

## 2014-01-21 NOTE — Progress Notes (Signed)
Pre visit review using our clinic review tool, if applicable. No additional management support is needed unless otherwise documented below in the visit note. 

## 2014-01-21 NOTE — Assessment & Plan Note (Signed)
Continue with current prescription therapy as reflected on the Med list.  

## 2014-01-21 NOTE — Progress Notes (Signed)
Subjective:     HPI    F/u s/p L TKR on 01/23/12 - she is better... She is retired  The patient presents for a follow-up of  chronic CAD, hypertension, chronic dyslipidemia, OA controlled with medicines, stable F/u depression, anxiety - better on Rx  Past Medical History  Diagnosis Date  . History of pneumonia 2008  . Hyperlipemia   . Osteoarthritis   . MI (myocardial infarction)     DR Tenny Crawoss  . HTN (hypertension)     dr Posey Reaplotnikov  . CAD (coronary artery disease)     DR Tenny Crawoss; by notes normal cath in 1986, normal stress 2013  . Family history of anesthesia complication     NIENCE had sensitivity  . Shortness of breath    Past Surgical History  Procedure Laterality Date  . Tonsillectomy    . Partial hysterectomy    . Knee arthroscopy      left  . Total knee arthroplasty  01/23/2012    Procedure: TOTAL KNEE ARTHROPLASTY;  Surgeon: Loreta Aveaniel F Murphy, MD;  Location: [facility] OR;  Service: Orthopedics;  Laterality: Left;  . Total knee arthroplasty  12/'05/2011    left knee  . Joint replacement  12/13    L TKR     reports that she has never smoked. She has never used smokeless tobacco. She reports that she does not drink alcohol or use illicit drugs. family history includes Glaucoma in her father; Hypertension in her mother and another family member. Allergies  Allergen Reactions  . Codeine Other (See Comments)    Abnormal behavior  . Crestor [Rosuvastatin]     Myalgias   . Dexilant [Dexlansoprazole]     Made me sick  . Gabapentin     REACTION: nausea  . Ibuprofen Nausea And Vomiting  . Simvastatin     REACTION: achy  . Tylenol [Acetaminophen]   . Iodine Swelling and Rash   Current Outpatient Prescriptions on File Prior to Visit  Medication Sig Dispense Refill  . acyclovir (ZOVIRAX) 400 MG tablet Take 1 tablet (400 mg total) by mouth 3 (three) times daily. 21 tablet 3  . amLODipine (NORVASC) 5 MG tablet take 1 tablet by mouth once daily 30 tablet 5  . cholecalciferol  (VITAMIN D) 400 UNITS TABS Take 400 Units by mouth daily.    . CRESTOR 5 MG tablet take 1 tablet by mouth once daily 30 tablet 11  . escitalopram (LEXAPRO) 10 MG tablet take 1 tablet by mouth once daily 30 tablet 5  . gabapentin (NEURONTIN) 100 MG capsule Take 1 capsule (100 mg total) by mouth 3 (three) times daily. 90 capsule 5  . HYDROcodone-acetaminophen (NORCO) 10-325 MG per tablet take 1 to 2 tablets by mouth every 8 hours if needed for  pain 100 tablet 0  . Krill Oil (MAXIMUM RED KRILL) 300 MG CAPS 1 po qd 100 capsule 3  . metoprolol succinate (TOPROL-XL) 50 MG 24 hr tablet Take 1 tablet (50 mg total) by mouth at bedtime. 30 tablet 5  . ranitidine (ZANTAC) 150 MG tablet Take 1 tablet (150 mg total) by mouth 2 (two) times daily. 180 tablet 3  . temazepam (RESTORIL) 30 MG capsule take 1 capsule by mouth at bedtime if needed for sleep 30 capsule 5  . triamcinolone cream (KENALOG) 0.5 % Apply topically 3 (three) times daily. rash 60 g 1  . triamterene-hydrochlorothiazide (MAXZIDE-25) 37.5-25 MG per tablet take 1 tablet by mouth once daily 30 tablet 11  .  vitamin B-12 (CYANOCOBALAMIN) 100 MCG tablet Take 100 mcg by mouth daily.     No current facility-administered medications on file prior to visit.   BP 144/78 mmHg  Pulse 64  Temp(Src) 98.1 F (36.7 C) (Oral)  Wt 187 lb (84.823 kg)  SpO2 98%    Wt Readings from Last 3 Encounters:  01/21/14 187 lb (84.823 kg)  10/22/13 183 lb (83.008 kg)  08/24/13 183 lb (83.008 kg)   BP Readings from Last 3 Encounters:  01/21/14 144/78  10/22/13 148/84  08/24/13 130/70      Review of Systems  Constitutional: Negative for chills, activity change, appetite change and unexpected weight change.  HENT: Negative for mouth sores and sinus pressure.   Eyes: Negative for visual disturbance.  Respiratory: Negative for chest tightness.   Gastrointestinal: Negative for nausea and abdominal pain.  Genitourinary: Negative for frequency, difficulty  urinating and vaginal pain.  Musculoskeletal: Positive for myalgias, back pain, arthralgias and gait problem.  Skin: Negative for pallor and wound.  Neurological: Negative for dizziness, tremors, weakness and headaches.  Psychiatric/Behavioral: Positive for sleep disturbance. Negative for suicidal ideas, behavioral problems, confusion and decreased concentration. The patient is nervous/anxious.        Objective:   Physical Exam  Constitutional: She appears well-developed. No distress.  HENT:  Head: Normocephalic.  Right Ear: External ear normal.  Left Ear: External ear normal.  Nose: Nose normal.  Mouth/Throat: Oropharynx is clear and moist.  Eyes: Conjunctivae are normal. Pupils are equal, round, and reactive to light. Right eye exhibits no discharge. Left eye exhibits no discharge.  Neck: Normal range of motion. Neck supple. No JVD present. No tracheal deviation present. No thyromegaly present.  Cardiovascular: Normal rate, regular rhythm and normal heart sounds.   Pulmonary/Chest: No stridor. No respiratory distress. She has no wheezes.  Abdominal: Soft. Bowel sounds are normal. She exhibits no distension and no mass. There is no tenderness. There is no rebound and no guarding.  Musculoskeletal: She exhibits no edema or tenderness.  Lymphadenopathy:    She has no cervical adenopathy.  Neurological: She displays normal reflexes. No cranial nerve deficit. She exhibits normal muscle tone. Coordination normal.  Skin: No rash noted. No erythema.  Psychiatric: She has a normal mood and affect. Her behavior is normal. Judgment and thought content normal.     Lab Results  Component Value Date   WBC 10.4 01/25/2012   HGB 9.0* 01/25/2012   HCT 26.1* 01/25/2012   PLT 177 01/25/2012   GLUCOSE 107* 03/24/2013   CHOL 288* 03/24/2013   TRIG 184.0* 03/24/2013   HDL 58.50 03/24/2013   LDLDIRECT 186.1 03/24/2013   LDLCALC 101* 09/02/2009   ALT 15 11/26/2012   AST 22 11/26/2012   NA 139  03/24/2013   K 3.8 03/24/2013   CL 104 03/24/2013   CREATININE 1.0 03/24/2013   BUN 11 03/24/2013   CO2 28 03/24/2013   TSH 2.58 11/26/2012   INR 1.44 01/25/2012   HGBA1C 6.0 03/24/2013        Assessment & Plan:

## 2014-03-18 ENCOUNTER — Other Ambulatory Visit: Payer: Self-pay | Admitting: Internal Medicine

## 2014-04-17 ENCOUNTER — Other Ambulatory Visit: Payer: Self-pay | Admitting: Internal Medicine

## 2014-04-20 ENCOUNTER — Other Ambulatory Visit: Payer: Self-pay | Admitting: Internal Medicine

## 2014-04-20 NOTE — Telephone Encounter (Signed)
Left msg on triage requesting status on refill...Raechel Chute/lmb

## 2014-04-20 NOTE — Telephone Encounter (Signed)
OK to fill this prescription with additional refills x3 Thank you!  

## 2014-04-20 NOTE — Telephone Encounter (Signed)
Called pharmacy spoke with christin gave md approval.../lmb

## 2014-05-07 ENCOUNTER — Other Ambulatory Visit: Payer: Self-pay | Admitting: Internal Medicine

## 2014-05-17 ENCOUNTER — Telehealth: Payer: Self-pay | Admitting: *Deleted

## 2014-05-17 NOTE — Telephone Encounter (Signed)
rf req for Alprazolam 0.25 mg 1 po bid prn anxiety. Last filled 04/19/14. Ok to rf?

## 2014-05-18 NOTE — Telephone Encounter (Signed)
OK to fill this prescription with additional refills x3 Thank you!  

## 2014-05-19 ENCOUNTER — Other Ambulatory Visit: Payer: Self-pay

## 2014-05-19 MED ORDER — ALPRAZOLAM 0.25 MG PO TABS: 0.2500 mg | ORAL_TABLET | Freq: Two times a day (BID) | ORAL | Status: DC | PRN

## 2014-05-19 NOTE — Patient Outreach (Signed)
[facility]) Care Management  05/19/2014  Terri King 12-Sep-1934 [id number]  RN CM attempted to reach patient to discuss the services of [facility].  Unable to reach patient and HIPPA compliant voice mail message left with return call back number.  RN CM will try again at a later date.  Terri Lawmanheryl Ayrton Mcvay, RN, Lowella DellMHA, [facility] Telephonic Care Coordinator [phone]

## 2014-05-19 NOTE — Telephone Encounter (Signed)
Rf phoned in.  

## 2014-05-20 ENCOUNTER — Other Ambulatory Visit: Payer: Self-pay

## 2014-05-20 NOTE — Patient Outreach (Signed)
[facility]) Care Management  05/20/2014  Terri King 1934-08-19 [id number]  RN CM spoke with patient and explained the services [facility].  Patient reports she has high blood pressure and heart disease.  Patient reports she takes her medications, watches what she eats, does not use salt, and keeps her appointments with her doctors.  Patient has a workable knowledge about how to self-manager her chronic diseases.  Patient did not have any acute health care concerns at the moment.  Patient is able to verbalize self-manage her chronic diseases and no further intervention is needed.  RN CM will close this case and notify primary physician.  RN CM will send a letter to patient with information about [facility] as per patient's request.  [name], RN, [facility], [facility] Telephonic Care Coordinator [phone]

## 2014-05-27 ENCOUNTER — Ambulatory Visit (INDEPENDENT_AMBULATORY_CARE_PROVIDER_SITE_OTHER): Payer: Commercial Managed Care - HMO | Admitting: Internal Medicine

## 2014-05-27 ENCOUNTER — Encounter: Payer: Self-pay | Admitting: Internal Medicine

## 2014-05-27 VITALS — BP 139/74 | HR 69 | Wt 183.0 lb

## 2014-05-27 DIAGNOSIS — E785 Hyperlipidemia, unspecified: Secondary | ICD-10-CM

## 2014-05-27 DIAGNOSIS — I2510 Atherosclerotic heart disease of native coronary artery without angina pectoris: Secondary | ICD-10-CM

## 2014-05-27 DIAGNOSIS — R739 Hyperglycemia, unspecified: Secondary | ICD-10-CM | POA: Diagnosis not present

## 2014-05-27 DIAGNOSIS — N3946 Mixed incontinence: Secondary | ICD-10-CM

## 2014-05-27 DIAGNOSIS — M2550 Pain in unspecified joint: Secondary | ICD-10-CM | POA: Diagnosis not present

## 2014-05-27 MED ORDER — FLUCONAZOLE 150 MG PO TABS: 150.0000 mg | ORAL_TABLET | Freq: Once | ORAL | Status: DC

## 2014-05-27 MED ORDER — IBUPROFEN 600 MG PO TABS: ORAL_TABLET | ORAL | Status: DC

## 2014-05-27 MED ORDER — HYDROCODONE-ACETAMINOPHEN 10-325 MG PO TABS: ORAL_TABLET | ORAL | Status: DC

## 2014-05-27 NOTE — Assessment & Plan Note (Signed)
Chronic  Potential benefits of a long term opioids use as well as potential risks (i.e. addiction risk, apnea etc) and complications (i.e. Somnolence, constipation and others) were explained to the patient and were aknowledged. Norco prn Ibuprofen 600 mg prn

## 2014-05-27 NOTE — Patient Outreach (Signed)
[facility]) Care Management  05/27/2014  Terri GoldenCarol L King 07-28-1934 [id number]   Received notification from [name] to close case due to consumer assessed and no further interventions needed.  Case closed at this point.  Corrie MckusickLisa O. [facility] Care [facility] CM Assistant Phone: [phone] Fax: [phone]

## 2014-05-27 NOTE — Progress Notes (Signed)
Pre visit review using our clinic review tool, if applicable. No additional management support is needed unless otherwise documented below in the visit note. 

## 2014-05-27 NOTE — Assessment & Plan Note (Signed)
Will check A1c

## 2014-05-27 NOTE — Assessment & Plan Note (Signed)
UA Treat yeast infection

## 2014-05-27 NOTE — Assessment & Plan Note (Signed)
Chronic Myalgias from the statins - able to tol Crestor qod. Vit D Labs

## 2014-05-27 NOTE — Assessment & Plan Note (Signed)
On Crestor and ASA

## 2014-05-27 NOTE — Progress Notes (Signed)
   Subjective:     HPI  F/u elevated glucose  F/u s/p L TKR on 01/23/12 - she is better... She is retired  The patient presents for a follow-up of  chronic CAD, hypertension, chronic dyslipidemia, OA controlled with medicines F/u depression, anxiety - better on Rx   Wt Readings from Last 3 Encounters:  05/27/14 183 lb (83.008 kg)  01/21/14 187 lb (84.823 kg)  10/22/13 183 lb (83.008 kg)   BP Readings from Last 3 Encounters:  05/27/14 140/74  01/21/14 144/78  10/22/13 148/84      Review of Systems  Constitutional: Negative for chills, activity change, appetite change and unexpected weight change.  HENT: Negative for mouth sores and sinus pressure.   Eyes: Negative for visual disturbance.  Respiratory: Negative for chest tightness.   Gastrointestinal: Negative for nausea and abdominal pain.  Genitourinary: Negative for frequency, difficulty urinating and vaginal pain.  Musculoskeletal: Positive for myalgias, back pain, arthralgias and gait problem.  Skin: Negative for pallor and wound.  Neurological: Negative for dizziness, tremors, weakness and headaches.  Psychiatric/Behavioral: Positive for sleep disturbance. Negative for suicidal ideas, behavioral problems, confusion and decreased concentration. The patient is nervous/anxious.        Objective:   Physical Exam  Constitutional: She appears well-developed. No distress.  HENT:  Head: Normocephalic.  Right Ear: External ear normal.  Left Ear: External ear normal.  Nose: Nose normal.  Mouth/Throat: Oropharynx is clear and moist.  Eyes: Conjunctivae are normal. Pupils are equal, round, and reactive to light. Right eye exhibits no discharge. Left eye exhibits no discharge.  Neck: Normal range of motion. Neck supple. No JVD present. No tracheal deviation present. No thyromegaly present.  Cardiovascular: Normal rate, regular rhythm and normal heart sounds.   Pulmonary/Chest: No stridor. No respiratory distress. She has no  wheezes.  Abdominal: Soft. Bowel sounds are normal. She exhibits no distension and no mass. There is no tenderness. There is no rebound and no guarding.  Musculoskeletal: She exhibits no edema or tenderness.  Lymphadenopathy:    She has no cervical adenopathy.  Neurological: She displays normal reflexes. No cranial nerve deficit. She exhibits normal muscle tone. Coordination normal.  Skin: No rash noted. No erythema.  Psychiatric: She has a normal mood and affect. Her behavior is normal. Judgment and thought content normal.  knees are tender B; stiff   Lab Results  Component Value Date   WBC 10.4 01/25/2012   HGB 9.0* 01/25/2012   HCT 26.1* 01/25/2012   PLT 177 01/25/2012   GLUCOSE 107* 03/24/2013   CHOL 288* 03/24/2013   TRIG 184.0* 03/24/2013   HDL 58.50 03/24/2013   LDLDIRECT 186.1 03/24/2013   LDLCALC 101* 09/02/2009   ALT 15 11/26/2012   AST 22 11/26/2012   NA 139 03/24/2013   K 3.8 03/24/2013   CL 104 03/24/2013   CREATININE 1.0 03/24/2013   BUN 11 03/24/2013   CO2 28 03/24/2013   TSH 2.58 11/26/2012   INR 1.44 01/25/2012   HGBA1C 6.0 03/24/2013        Assessment & Plan:

## 2014-07-16 ENCOUNTER — Other Ambulatory Visit: Payer: Self-pay | Admitting: Geriatric Medicine

## 2014-07-16 MED ORDER — METOPROLOL SUCCINATE ER 50 MG PO TB24: 50.0000 mg | ORAL_TABLET | Freq: Every day | ORAL | Status: DC

## 2014-08-11 ENCOUNTER — Other Ambulatory Visit: Payer: Self-pay | Admitting: Internal Medicine

## 2014-08-11 ENCOUNTER — Other Ambulatory Visit (INDEPENDENT_AMBULATORY_CARE_PROVIDER_SITE_OTHER): Payer: Commercial Managed Care - HMO

## 2014-08-11 DIAGNOSIS — I2510 Atherosclerotic heart disease of native coronary artery without angina pectoris: Secondary | ICD-10-CM

## 2014-08-11 DIAGNOSIS — M2550 Pain in unspecified joint: Secondary | ICD-10-CM

## 2014-08-11 DIAGNOSIS — R739 Hyperglycemia, unspecified: Secondary | ICD-10-CM | POA: Diagnosis not present

## 2014-08-11 DIAGNOSIS — E785 Hyperlipidemia, unspecified: Secondary | ICD-10-CM | POA: Diagnosis not present

## 2014-08-11 LAB — BASIC METABOLIC PANEL
BUN: 9 mg/dL (ref 6–23)
CHLORIDE: 103 meq/L (ref 96–112)
CO2: 27 mEq/L (ref 19–32)
Calcium: 9.8 mg/dL (ref 8.4–10.5)
Creatinine, Ser: 0.98 mg/dL (ref 0.40–1.20)
GFR: 70.51 mL/min (ref >60.00)
GLUCOSE: 129 mg/dL — AB (ref 70–99)
Potassium: 3.3 mEq/L — ABNORMAL LOW (ref 3.5–5.1)
Sodium: 139 mEq/L (ref 135–145)

## 2014-08-11 LAB — LIPID PANEL
Cholesterol: 193 mg/dL (ref 0–200)
HDL: 58.1 mg/dL (ref >39.00)
LDL CALC: 101 mg/dL — AB (ref 0–99)
NonHDL: 134.9
Total CHOL/HDL Ratio: 3
Triglycerides: 172 mg/dL — ABNORMAL HIGH (ref 0.0–149.0)
VLDL: 34.4 mg/dL (ref 0.0–40.0)

## 2014-08-11 LAB — HEMOGLOBIN A1C: HEMOGLOBIN A1C: 5.7 % (ref 4.6–6.5)

## 2014-08-11 LAB — URINALYSIS, ROUTINE W REFLEX MICROSCOPIC
Leukocytes, UA: NEGATIVE
Nitrite: NEGATIVE
PH: 6 (ref 5.0–8.0)
Specific Gravity, Urine: 1.025 (ref 1.000–1.030)
Total Protein, Urine: 30 — AB
UROBILINOGEN UA: 1 (ref 0.0–1.0)
Urine Glucose: NEGATIVE

## 2014-08-11 LAB — HEPATIC FUNCTION PANEL
ALT: 15 U/L (ref 0–35)
AST: 23 U/L (ref 0–37)
Albumin: 4.2 g/dL (ref 3.5–5.2)
Alkaline Phosphatase: 77 U/L (ref 39–117)
Bilirubin, Direct: 0.1 mg/dL (ref 0.0–0.3)
TOTAL PROTEIN: 7.6 g/dL (ref 6.0–8.3)
Total Bilirubin: 0.5 mg/dL (ref 0.2–1.2)

## 2014-08-11 LAB — TSH: TSH: 2.42 u[IU]/mL (ref 0.35–4.50)

## 2014-08-11 MED ORDER — POTASSIUM CHLORIDE CRYS ER 20 MEQ PO TBCR: 20.0000 meq | EXTENDED_RELEASE_TABLET | Freq: Every day | ORAL | Status: DC

## 2014-08-20 ENCOUNTER — Other Ambulatory Visit: Payer: Self-pay

## 2014-08-20 MED ORDER — ACYCLOVIR 400 MG PO TABS: 400.0000 mg | ORAL_TABLET | Freq: Three times a day (TID) | ORAL | Status: DC

## 2014-08-22 ENCOUNTER — Other Ambulatory Visit: Payer: Self-pay | Admitting: Internal Medicine

## 2014-08-24 DIAGNOSIS — Z1231 Encounter for screening mammogram for malignant neoplasm of breast: Secondary | ICD-10-CM | POA: Diagnosis not present

## 2014-08-24 LAB — HM MAMMOGRAPHY

## 2014-08-26 NOTE — Telephone Encounter (Signed)
Called in script for Restoril

## 2014-08-27 ENCOUNTER — Encounter: Payer: Self-pay | Admitting: Internal Medicine

## 2014-09-09 ENCOUNTER — Encounter: Payer: Self-pay | Admitting: Internal Medicine

## 2014-09-16 ENCOUNTER — Other Ambulatory Visit: Payer: Self-pay

## 2014-09-16 ENCOUNTER — Telehealth: Payer: Self-pay | Admitting: *Deleted

## 2014-09-16 ENCOUNTER — Other Ambulatory Visit: Payer: Self-pay | Admitting: *Deleted

## 2014-09-16 MED ORDER — POTASSIUM CHLORIDE CRYS ER 20 MEQ PO TBCR: 20.0000 meq | EXTENDED_RELEASE_TABLET | Freq: Every day | ORAL | Status: DC

## 2014-09-16 MED ORDER — ACYCLOVIR 400 MG PO TABS: 400.0000 mg | ORAL_TABLET | Freq: Three times a day (TID) | ORAL | Status: DC

## 2014-09-16 MED ORDER — METOPROLOL SUCCINATE ER 50 MG PO TB24: 50.0000 mg | ORAL_TABLET | Freq: Every day | ORAL | Status: DC

## 2014-09-16 MED ORDER — ROSUVASTATIN CALCIUM 5 MG PO TABS: 5.0000 mg | ORAL_TABLET | Freq: Every day | ORAL | Status: DC

## 2014-09-16 MED ORDER — RANITIDINE HCL 150 MG PO TABS: 150.0000 mg | ORAL_TABLET | Freq: Two times a day (BID) | ORAL | Status: DC

## 2014-09-16 MED ORDER — IBUPROFEN 600 MG PO TABS: ORAL_TABLET | ORAL | Status: DC

## 2014-09-16 MED ORDER — GABAPENTIN 100 MG PO CAPS: 100.0000 mg | ORAL_CAPSULE | Freq: Three times a day (TID) | ORAL | Status: DC

## 2014-09-16 MED ORDER — ESCITALOPRAM OXALATE 10 MG PO TABS: 10.0000 mg | ORAL_TABLET | Freq: Every day | ORAL | Status: DC

## 2014-09-16 MED ORDER — TRIAMTERENE-HCTZ 37.5-25 MG PO TABS: 1.0000 | ORAL_TABLET | Freq: Every day | ORAL | Status: DC

## 2014-09-16 NOTE — Telephone Encounter (Signed)
Rf req for Alprazolam 0.25 mg and Temazepam 30 mg. 90 day supply. Ok to Rf?   * If so, fax printed Rxs to [phone]*

## 2014-09-17 MED ORDER — ALPRAZOLAM 0.25 MG PO TABS: 0.2500 mg | ORAL_TABLET | Freq: Two times a day (BID) | ORAL | Status: DC | PRN

## 2014-09-17 MED ORDER — TEMAZEPAM 30 MG PO CAPS: 30.0000 mg | ORAL_CAPSULE | Freq: Every evening | ORAL | Status: DC | PRN

## 2014-09-17 NOTE — Telephone Encounter (Signed)
Rxs printed and faxed to pharmacy.

## 2014-09-17 NOTE — Telephone Encounter (Signed)
Ok to fill both w/3 ref Thx

## 2014-09-21 ENCOUNTER — Other Ambulatory Visit: Payer: Self-pay | Admitting: Internal Medicine

## 2014-09-21 ENCOUNTER — Other Ambulatory Visit: Payer: Self-pay | Admitting: *Deleted

## 2014-09-21 MED ORDER — AMLODIPINE BESYLATE 5 MG PO TABS: 5.0000 mg | ORAL_TABLET | Freq: Every day | ORAL | Status: DC

## 2014-09-22 ENCOUNTER — Other Ambulatory Visit: Payer: Self-pay | Admitting: *Deleted

## 2014-09-22 MED ORDER — IBUPROFEN 600 MG PO TABS: ORAL_TABLET | ORAL | Status: DC

## 2014-09-27 ENCOUNTER — Ambulatory Visit (INDEPENDENT_AMBULATORY_CARE_PROVIDER_SITE_OTHER): Payer: Commercial Managed Care - HMO | Admitting: Internal Medicine

## 2014-09-27 ENCOUNTER — Encounter: Payer: Self-pay | Admitting: Internal Medicine

## 2014-09-27 ENCOUNTER — Telehealth: Payer: Self-pay | Admitting: Internal Medicine

## 2014-09-27 VITALS — BP 128/70 | HR 68 | Wt 181.0 lb

## 2014-09-27 DIAGNOSIS — I10 Essential (primary) hypertension: Secondary | ICD-10-CM | POA: Diagnosis not present

## 2014-09-27 DIAGNOSIS — R739 Hyperglycemia, unspecified: Secondary | ICD-10-CM

## 2014-09-27 DIAGNOSIS — M2550 Pain in unspecified joint: Secondary | ICD-10-CM

## 2014-09-27 DIAGNOSIS — I2510 Atherosclerotic heart disease of native coronary artery without angina pectoris: Secondary | ICD-10-CM

## 2014-09-27 NOTE — Telephone Encounter (Signed)
Patient returned your phone call.

## 2014-09-27 NOTE — Progress Notes (Signed)
Subjective:  Patient ID: Terri King, female    DOB: 07-16-1934  Age: 80 y.o. MRN: [id number]  CC: No chief complaint on file.   HPI ROSEALIE REACH presents for OA, HTN, CAD f/u  Outpatient Prescriptions Prior to Visit  Medication Sig Dispense Refill  . acyclovir (ZOVIRAX) 400 MG tablet Take 1 tablet (400 mg total) by mouth 3 (three) times daily. 21 tablet 3  . ALPRAZolam (XANAX) 0.25 MG tablet Take 1 tablet (0.25 mg total) by mouth 2 (two) times daily as needed for anxiety. 60 tablet 3  . amLODipine (NORVASC) 5 MG tablet Take 1 tablet (5 mg total) by mouth daily. 90 tablet 3  . cholecalciferol (VITAMIN D) 400 UNITS TABS Take 400 Units by mouth daily.    Marland Kitchen escitalopram (LEXAPRO) 10 MG tablet Take 1 tablet (10 mg total) by mouth daily. 90 tablet 3  . escitalopram (LEXAPRO) 10 MG tablet take 1 tablet by mouth once daily 30 tablet 0  . fluconazole (DIFLUCAN) 150 MG tablet Take 1 tablet (150 mg total) by mouth once. 1 tablet 2  . gabapentin (NEURONTIN) 100 MG capsule Take 1 capsule (100 mg total) by mouth 3 (three) times daily. 270 capsule 3  . HYDROcodone-acetaminophen (NORCO) 10-325 MG per tablet take 1 to 2 tablets by mouth every 8 hours if needed for  pain 100 tablet 0  . ibuprofen (ADVIL,MOTRIN) 600 MG tablet Take 1 by mouth every 8 hours as needed for pain. 90 tablet 3  . Krill Oil (MAXIMUM RED KRILL) 300 MG CAPS 1 po qd 100 capsule 3  . metoprolol succinate (TOPROL-XL) 50 MG 24 hr tablet Take 1 tablet (50 mg total) by mouth at bedtime. 90 tablet 3  . potassium chloride SA (K-DUR,KLOR-CON) 20 MEQ tablet Take 1 tablet (20 mEq total) by mouth daily. 90 tablet 3  . ranitidine (ZANTAC) 150 MG tablet Take 1 tablet (150 mg total) by mouth 2 (two) times daily. 180 tablet 3  . rosuvastatin (CRESTOR) 5 MG tablet Take 1 tablet (5 mg total) by mouth daily. 90 tablet 3  . temazepam (RESTORIL) 30 MG capsule Take 1 capsule (30 mg total) by mouth at bedtime as needed for sleep. 90 capsule 3  .  triamterene-hydrochlorothiazide (MAXZIDE-25) 37.5-25 MG per tablet Take 1 tablet by mouth daily. 90 tablet 3  . triamterene-hydrochlorothiazide (MAXZIDE-25) 37.5-25 MG per tablet take 1 tablet by mouth once daily 30 tablet 0  . vitamin B-12 (CYANOCOBALAMIN) 100 MCG tablet Take 100 mcg by mouth daily.     No facility-administered medications prior to visit.    ROS Review of Systems  Constitutional: Negative for chills, activity change, appetite change, fatigue and unexpected weight change.  HENT: Negative for congestion, mouth sores and sinus pressure.   Eyes: Negative for visual disturbance.  Respiratory: Negative for cough and chest tightness.   Gastrointestinal: Negative for nausea and abdominal pain.  Genitourinary: Negative for frequency, difficulty urinating and vaginal pain.  Musculoskeletal: Positive for arthralgias and gait problem. Negative for back pain.  Skin: Negative for pallor and rash.  Neurological: Negative for dizziness, tremors, weakness, numbness and headaches.  Psychiatric/Behavioral: Negative for suicidal ideas, confusion, sleep disturbance and dysphoric mood.    Objective:  BP 128/70 mmHg  Pulse 68  Wt 181 lb (82.101 kg)  SpO2 95%  BP Readings from Last 3 Encounters:  09/27/14 128/70  05/27/14 139/74  01/21/14 144/78    Wt Readings from Last 3 Encounters:  09/27/14 181 lb (82.101 kg)  05/27/14 183 lb (83.008 kg)  01/21/14 187 lb (84.823 kg)    Physical Exam  Constitutional: She appears well-developed. No distress.  HENT:  Head: Normocephalic.  Right Ear: External ear normal.  Left Ear: External ear normal.  Nose: Nose normal.  Mouth/Throat: Oropharynx is clear and moist.  Eyes: Conjunctivae are normal. Pupils are equal, round, and reactive to light. Right eye exhibits no discharge. Left eye exhibits no discharge.  Neck: Normal range of motion. Neck supple. No JVD present. No tracheal deviation present. No thyromegaly present.  Cardiovascular:  Normal rate, regular rhythm and normal heart sounds.   Pulmonary/Chest: No stridor. No respiratory distress. She has no wheezes.  Abdominal: Soft. Bowel sounds are normal. She exhibits no distension and no mass. There is no tenderness. There is no rebound and no guarding.  Musculoskeletal: She exhibits no edema or tenderness.  Lymphadenopathy:    She has no cervical adenopathy.  Neurological: She displays normal reflexes. No cranial nerve deficit. She exhibits normal muscle tone. Coordination normal.  Skin: No rash noted. No erythema.  Psychiatric: She has a normal mood and affect. Her behavior is normal. Judgment and thought content normal.  RLE is shorter  Lab Results  Component Value Date   WBC 10.4 01/25/2012   HGB 9.0* 01/25/2012   HCT 26.1* 01/25/2012   PLT 177 01/25/2012   GLUCOSE 129* 08/11/2014   CHOL 193 08/11/2014   TRIG 172.0* 08/11/2014   HDL 58.10 08/11/2014   LDLDIRECT 186.1 03/24/2013   LDLCALC 101* 08/11/2014   ALT 15 08/11/2014   AST 23 08/11/2014   NA 139 08/11/2014   K 3.3* 08/11/2014   CL 103 08/11/2014   CREATININE 0.98 08/11/2014   BUN 9 08/11/2014   CO2 27 08/11/2014   TSH 2.42 08/11/2014   INR 1.44 01/25/2012   HGBA1C 5.7 08/11/2014    Dg Chest 2 View  01/18/2012   *RADIOLOGY REPORT*  Clinical Data: Hypertension.  CHEST - 2 VIEW  Comparison: 07/12/2010  Findings: Heart and mediastinal contours are within normal limits. No focal opacities or effusions.  No acute bony abnormality.  IMPRESSION: No active cardiopulmonary disease.   Original Report Authenticated By: Charlett Nose, M.D.    Assessment & Plan:   There are no diagnoses linked to this encounter. I am having Ms. Durnil maintain her vitamin B-12, cholecalciferol, MAXIMUM RED KRILL, HYDROcodone-acetaminophen, fluconazole, acyclovir, rosuvastatin, escitalopram, gabapentin, ranitidine, triamterene-hydrochlorothiazide, metoprolol succinate, potassium chloride SA, ALPRAZolam, temazepam, amLODipine,  triamterene-hydrochlorothiazide, escitalopram, and ibuprofen.  No orders of the defined types were placed in this encounter.     Follow-up: No Follow-up on file.  Sonda Primes, MD

## 2014-09-27 NOTE — Assessment & Plan Note (Signed)
RLE is shorter post-op

## 2014-09-27 NOTE — Progress Notes (Signed)
Pre visit review using our clinic review tool, if applicable. No additional management support is needed unless otherwise documented below in the visit note. 

## 2014-09-27 NOTE — Telephone Encounter (Signed)
Pt informed of below.  

## 2014-09-27 NOTE — Assessment & Plan Note (Signed)
Chronic Norvasc, ASA 81

## 2014-09-27 NOTE — Assessment & Plan Note (Signed)
Labs

## 2014-09-27 NOTE — Assessment & Plan Note (Signed)
Labs On Crestor and ASA, Norvasc

## 2014-09-27 NOTE — Telephone Encounter (Signed)
Left mess for patient to call back.  

## 2014-09-27 NOTE — Telephone Encounter (Signed)
Terri King, please, call For a short leg: 1. Try OTC heel lift/inserts 2. See Dr Michiel Sites here if a custom made insert is needed Thx

## 2014-10-05 ENCOUNTER — Other Ambulatory Visit: Payer: Self-pay

## 2014-12-23 ENCOUNTER — Other Ambulatory Visit (INDEPENDENT_AMBULATORY_CARE_PROVIDER_SITE_OTHER): Payer: Commercial Managed Care - HMO

## 2014-12-23 DIAGNOSIS — M2550 Pain in unspecified joint: Secondary | ICD-10-CM | POA: Diagnosis not present

## 2014-12-23 DIAGNOSIS — I10 Essential (primary) hypertension: Secondary | ICD-10-CM

## 2014-12-23 DIAGNOSIS — I2510 Atherosclerotic heart disease of native coronary artery without angina pectoris: Secondary | ICD-10-CM | POA: Diagnosis not present

## 2014-12-23 DIAGNOSIS — R739 Hyperglycemia, unspecified: Secondary | ICD-10-CM | POA: Diagnosis not present

## 2014-12-23 LAB — BASIC METABOLIC PANEL
BUN: 7 mg/dL (ref 6–23)
CALCIUM: 9.7 mg/dL (ref 8.4–10.5)
CHLORIDE: 105 meq/L (ref 96–112)
CO2: 28 meq/L (ref 19–32)
CREATININE: 0.84 mg/dL (ref 0.40–1.20)
GFR: 84.15 mL/min (ref >60.00)
GLUCOSE: 118 mg/dL — AB (ref 70–99)
Potassium: 3.5 mEq/L (ref 3.5–5.1)
Sodium: 141 mEq/L (ref 135–145)

## 2014-12-23 LAB — HEMOGLOBIN A1C: HEMOGLOBIN A1C: 5.7 % (ref 4.6–6.5)

## 2014-12-23 LAB — LIPID PANEL
CHOL/HDL RATIO: 3
Cholesterol: 181 mg/dL (ref 0–200)
HDL: 57 mg/dL (ref >39.00)
LDL Cholesterol: 89 mg/dL (ref 0–99)
NONHDL: 124.47
TRIGLYCERIDES: 179 mg/dL — AB (ref 0.0–149.0)
VLDL: 35.8 mg/dL (ref 0.0–40.0)

## 2014-12-23 LAB — HEPATIC FUNCTION PANEL
ALT: 11 U/L (ref 0–35)
AST: 20 U/L (ref 0–37)
Albumin: 4 g/dL (ref 3.5–5.2)
Alkaline Phosphatase: 73 U/L (ref 39–117)
BILIRUBIN DIRECT: 0.1 mg/dL (ref 0.0–0.3)
BILIRUBIN TOTAL: 0.6 mg/dL (ref 0.2–1.2)
Total Protein: 7.2 g/dL (ref 6.0–8.3)

## 2014-12-28 ENCOUNTER — Other Ambulatory Visit: Payer: Self-pay | Admitting: Internal Medicine

## 2014-12-29 ENCOUNTER — Ambulatory Visit (INDEPENDENT_AMBULATORY_CARE_PROVIDER_SITE_OTHER): Payer: Commercial Managed Care - HMO | Admitting: Internal Medicine

## 2014-12-29 ENCOUNTER — Encounter: Payer: Self-pay | Admitting: Internal Medicine

## 2014-12-29 VITALS — BP 148/64 | HR 68 | Wt 183.0 lb

## 2014-12-29 DIAGNOSIS — M170 Bilateral primary osteoarthritis of knee: Secondary | ICD-10-CM

## 2014-12-29 DIAGNOSIS — G4700 Insomnia, unspecified: Secondary | ICD-10-CM | POA: Diagnosis not present

## 2014-12-29 DIAGNOSIS — I10 Essential (primary) hypertension: Secondary | ICD-10-CM

## 2014-12-29 DIAGNOSIS — I2510 Atherosclerotic heart disease of native coronary artery without angina pectoris: Secondary | ICD-10-CM | POA: Diagnosis not present

## 2014-12-29 MED ORDER — ALPRAZOLAM 0.25 MG PO TABS: 0.2500 mg | ORAL_TABLET | Freq: Two times a day (BID) | ORAL | Status: DC | PRN

## 2014-12-29 NOTE — Progress Notes (Signed)
Pre visit review using our clinic review tool, if applicable. No additional management support is needed unless otherwise documented below in the visit note. 

## 2014-12-29 NOTE — Progress Notes (Signed)
Subjective:  Patient ID: Terri King, female    DOB: 04/17/1934  Age: 80 y.o. MRN: [id number]  CC: No chief complaint on file.   HPI Terri King presents for HTN, OA, anxiety f/u  Outpatient Prescriptions Prior to Visit  Medication Sig Dispense Refill  . acyclovir (ZOVIRAX) 400 MG tablet TAKE 1 TABLET THREE TIMES DAILY 21 tablet 3  . amLODipine (NORVASC) 5 MG tablet Take 1 tablet (5 mg total) by mouth daily. 90 tablet 3  . cholecalciferol (VITAMIN D) 400 UNITS TABS Take 400 Units by mouth daily.    Marland Kitchen. escitalopram (LEXAPRO) 10 MG tablet Take 1 tablet (10 mg total) by mouth daily. 90 tablet 3  . gabapentin (NEURONTIN) 100 MG capsule Take 1 capsule (100 mg total) by mouth 3 (three) times daily. 270 capsule 3  . HYDROcodone-acetaminophen (NORCO) 10-325 MG per tablet take 1 to 2 tablets by mouth every 8 hours if needed for  pain 100 tablet 0  . ibuprofen (ADVIL,MOTRIN) 600 MG tablet Take 1 by mouth every 8 hours as needed for pain. 90 tablet 3  . Krill Oil (MAXIMUM RED KRILL) 300 MG CAPS 1 po qd 100 capsule 3  . metoprolol succinate (TOPROL-XL) 50 MG 24 hr tablet Take 1 tablet (50 mg total) by mouth at bedtime. 90 tablet 3  . potassium chloride SA (K-DUR,KLOR-CON) 20 MEQ tablet Take 1 tablet (20 mEq total) by mouth daily. 90 tablet 3  . ranitidine (ZANTAC) 150 MG tablet Take 1 tablet (150 mg total) by mouth 2 (two) times daily. 180 tablet 3  . rosuvastatin (CRESTOR) 5 MG tablet Take 1 tablet (5 mg total) by mouth daily. 90 tablet 3  . temazepam (RESTORIL) 30 MG capsule Take 1 capsule (30 mg total) by mouth at bedtime as needed for sleep. 90 capsule 3  . triamterene-hydrochlorothiazide (MAXZIDE-25) 37.5-25 MG per tablet Take 1 tablet by mouth daily. 90 tablet 3  . vitamin B-12 (CYANOCOBALAMIN) 100 MCG tablet Take 100 mcg by mouth daily.    Marland Kitchen. ALPRAZolam (XANAX) 0.25 MG tablet Take 1 tablet (0.25 mg total) by mouth 2 (two) times daily as needed for anxiety. 60 tablet 3   No  facility-administered medications prior to visit.    ROS Review of Systems  Constitutional: Negative for chills, activity change, appetite change, fatigue and unexpected weight change.  HENT: Negative for congestion, mouth sores and sinus pressure.   Eyes: Negative for visual disturbance.  Respiratory: Negative for cough, chest tightness and shortness of breath.   Gastrointestinal: Negative for nausea and abdominal pain.  Genitourinary: Negative for frequency, difficulty urinating and vaginal pain.  Musculoskeletal: Positive for arthralgias. Negative for back pain and gait problem.  Skin: Negative for pallor and rash.  Neurological: Negative for dizziness, tremors, weakness, numbness and headaches.  Psychiatric/Behavioral: Negative for confusion and sleep disturbance. The patient is not nervous/anxious.     Objective:  BP 148/64 mmHg  Pulse 68  Wt 183 lb (83.008 kg)  SpO2 95%  BP Readings from Last 3 Encounters:  12/29/14 148/64  09/27/14 128/70  05/27/14 139/74    Wt Readings from Last 3 Encounters:  12/29/14 183 lb (83.008 kg)  09/27/14 181 lb (82.101 kg)  05/27/14 183 lb (83.008 kg)    Physical Exam  Constitutional: She appears well-developed. No distress.  HENT:  Head: Normocephalic.  Right Ear: External ear normal.  Left Ear: External ear normal.  Nose: Nose normal.  Mouth/Throat: Oropharynx is clear and moist.  Eyes: Conjunctivae are  normal. Pupils are equal, round, and reactive to light. Right eye exhibits no discharge. Left eye exhibits no discharge.  Neck: Normal range of motion. Neck supple. No JVD present. No tracheal deviation present. No thyromegaly present.  Cardiovascular: Normal rate, regular rhythm and normal heart sounds.   Pulmonary/Chest: No stridor. No respiratory distress. She has no wheezes.  Abdominal: Soft. Bowel sounds are normal. She exhibits no distension and no mass. There is no tenderness. There is no rebound and no guarding.    Musculoskeletal: She exhibits tenderness. She exhibits no edema.  Lymphadenopathy:    She has no cervical adenopathy.  Neurological: She displays normal reflexes. No cranial nerve deficit. She exhibits normal muscle tone. Coordination normal.  Skin: No rash noted. No erythema.  Psychiatric: She has a normal mood and affect. Her behavior is normal. Judgment and thought content normal.    Lab Results  Component Value Date   WBC 10.4 01/25/2012   HGB 9.0* 01/25/2012   HCT 26.1* 01/25/2012   PLT 177 01/25/2012   GLUCOSE 118* 12/23/2014   CHOL 181 12/23/2014   TRIG 179.0* 12/23/2014   HDL 57.00 12/23/2014   LDLDIRECT 186.1 03/24/2013   LDLCALC 89 12/23/2014   ALT 11 12/23/2014   AST 20 12/23/2014   NA 141 12/23/2014   K 3.5 12/23/2014   CL 105 12/23/2014   CREATININE 0.84 12/23/2014   BUN 7 12/23/2014   CO2 28 12/23/2014   TSH 2.42 08/11/2014   INR 1.44 01/25/2012   HGBA1C 5.7 12/23/2014    Dg Chest 2 View  01/18/2012  *RADIOLOGY REPORT* Clinical Data: Hypertension. CHEST - 2 VIEW Comparison: 07/12/2010 Findings: Heart and mediastinal contours are within normal limits. No focal opacities or effusions.  No acute bony abnormality. IMPRESSION: No active cardiopulmonary disease. Original Report Authenticated By: Charlett Nose, M.D.    Assessment & Plan:   There are no diagnoses linked to this encounter. I am having [name] maintain her vitamin B-12, cholecalciferol, MAXIMUM RED KRILL, HYDROcodone-acetaminophen, rosuvastatin, escitalopram, gabapentin, ranitidine, triamterene-hydrochlorothiazide, metoprolol succinate, potassium chloride SA, temazepam, amLODipine, ibuprofen, acyclovir, and ALPRAZolam.  Meds ordered this encounter  Medications  . DISCONTD: ALPRAZolam (XANAX) 0.25 MG tablet    Sig: Take 1 tablet (0.25 mg total) by mouth 2 (two) times daily as needed for anxiety.    Dispense:  60 tablet    Refill:  3  . ALPRAZolam (XANAX) 0.25 MG tablet    Sig: Take 1 tablet  (0.25 mg total) by mouth 2 (two) times daily as needed for anxiety.    Dispense:  180 tablet    Refill:  1     Follow-up: Return in about 4 months (around 04/28/2015) for Wellness Exam.  Sonda Primes, MD

## 2014-12-29 NOTE — Assessment & Plan Note (Signed)
Try a power nap after lunch

## 2014-12-29 NOTE — Assessment & Plan Note (Signed)
Crestor and ASA, Norvasc No CP

## 2014-12-29 NOTE — Assessment & Plan Note (Signed)
B knees - chronic S/p L TKR Norco prn  Potential benefits of a long term opioids use as well as potential risks (i.e. addiction risk, apnea etc) and complications (i.e. Somnolence, constipation and others) were explained to the patient and were aknowledged.

## 2014-12-29 NOTE — Assessment & Plan Note (Signed)
Norvasc, ASA 81 

## 2015-03-21 ENCOUNTER — Telehealth: Payer: Self-pay | Admitting: *Deleted

## 2015-03-21 MED ORDER — TEMAZEPAM 30 MG PO CAPS: 30.0000 mg | ORAL_CAPSULE | Freq: Every evening | ORAL | Status: DC | PRN

## 2015-03-21 NOTE — Telephone Encounter (Signed)
Notified pt rzx has been fax to [facility]...Raechel Chute

## 2015-03-21 NOTE — Telephone Encounter (Signed)
  OK to fill this prescription with additional refills x5 Thank you!  Thx

## 2015-03-21 NOTE — Telephone Encounter (Signed)
Receive call pt is needing refill on her Temazepam 30 mg sent to [facility]...Raechel Chute

## 2015-04-12 ENCOUNTER — Other Ambulatory Visit: Payer: Self-pay | Admitting: Internal Medicine

## 2015-04-21 ENCOUNTER — Other Ambulatory Visit: Payer: Commercial Managed Care - HMO

## 2015-04-28 ENCOUNTER — Ambulatory Visit: Payer: Commercial Managed Care - HMO | Admitting: Internal Medicine

## 2015-04-29 ENCOUNTER — Emergency Department (HOSPITAL_COMMUNITY)
Admission: EM | Admit: 2015-04-29 | Discharge: 2015-04-29 | Disposition: A | Discharge status: Home / Self Care | Payer: Commercial Managed Care - HMO | Attending: Emergency Medicine | Admitting: Emergency Medicine

## 2015-04-29 ENCOUNTER — Encounter (HOSPITAL_COMMUNITY): Payer: Self-pay | Admitting: Emergency Medicine

## 2015-04-29 ENCOUNTER — Emergency Department (HOSPITAL_COMMUNITY): Payer: Commercial Managed Care - HMO

## 2015-04-29 DIAGNOSIS — I252 Old myocardial infarction: Secondary | ICD-10-CM | POA: Diagnosis not present

## 2015-04-29 DIAGNOSIS — S199XXA Unspecified injury of neck, initial encounter: Secondary | ICD-10-CM | POA: Diagnosis not present

## 2015-04-29 DIAGNOSIS — Y9289 Other specified places as the place of occurrence of the external cause: Secondary | ICD-10-CM | POA: Insufficient documentation

## 2015-04-29 DIAGNOSIS — I2510 Atherosclerotic heart disease of native coronary artery without angina pectoris: Secondary | ICD-10-CM | POA: Insufficient documentation

## 2015-04-29 DIAGNOSIS — S29001A Unspecified injury of muscle and tendon of front wall of thorax, initial encounter: Secondary | ICD-10-CM | POA: Diagnosis present

## 2015-04-29 DIAGNOSIS — W19XXXA Unspecified fall, initial encounter: Secondary | ICD-10-CM

## 2015-04-29 DIAGNOSIS — I10 Essential (primary) hypertension: Secondary | ICD-10-CM | POA: Insufficient documentation

## 2015-04-29 DIAGNOSIS — J942 Hemothorax: Secondary | ICD-10-CM

## 2015-04-29 DIAGNOSIS — Z79899 Other long term (current) drug therapy: Secondary | ICD-10-CM | POA: Insufficient documentation

## 2015-04-29 DIAGNOSIS — W08XXXA Fall from other furniture, initial encounter: Secondary | ICD-10-CM | POA: Insufficient documentation

## 2015-04-29 DIAGNOSIS — Y998 Other external cause status: Secondary | ICD-10-CM | POA: Diagnosis not present

## 2015-04-29 DIAGNOSIS — R52 Pain, unspecified: Secondary | ICD-10-CM

## 2015-04-29 DIAGNOSIS — Z8701 Personal history of pneumonia (recurrent): Secondary | ICD-10-CM | POA: Diagnosis not present

## 2015-04-29 DIAGNOSIS — S4992XA Unspecified injury of left shoulder and upper arm, initial encounter: Secondary | ICD-10-CM | POA: Insufficient documentation

## 2015-04-29 DIAGNOSIS — M1990 Unspecified osteoarthritis, unspecified site: Secondary | ICD-10-CM | POA: Insufficient documentation

## 2015-04-29 DIAGNOSIS — E785 Hyperlipidemia, unspecified: Secondary | ICD-10-CM | POA: Diagnosis not present

## 2015-04-29 DIAGNOSIS — S2232XA Fracture of one rib, left side, initial encounter for closed fracture: Secondary | ICD-10-CM

## 2015-04-29 DIAGNOSIS — M542 Cervicalgia: Secondary | ICD-10-CM | POA: Diagnosis not present

## 2015-04-29 DIAGNOSIS — S29002A Unspecified injury of muscle and tendon of back wall of thorax, initial encounter: Secondary | ICD-10-CM | POA: Diagnosis not present

## 2015-04-29 DIAGNOSIS — S271XXA Traumatic hemothorax, initial encounter: Secondary | ICD-10-CM | POA: Diagnosis not present

## 2015-04-29 DIAGNOSIS — S2242XA Multiple fractures of ribs, left side, initial encounter for closed fracture: Secondary | ICD-10-CM | POA: Insufficient documentation

## 2015-04-29 DIAGNOSIS — Y9389 Activity, other specified: Secondary | ICD-10-CM | POA: Diagnosis not present

## 2015-04-29 DIAGNOSIS — S4991XA Unspecified injury of right shoulder and upper arm, initial encounter: Secondary | ICD-10-CM | POA: Diagnosis not present

## 2015-04-29 DIAGNOSIS — S299XXA Unspecified injury of thorax, initial encounter: Secondary | ICD-10-CM | POA: Diagnosis not present

## 2015-04-29 DIAGNOSIS — M546 Pain in thoracic spine: Secondary | ICD-10-CM | POA: Diagnosis not present

## 2015-04-29 LAB — CBC WITH DIFFERENTIAL/PLATELET
Basophils Absolute: 0 10*3/uL (ref 0.0–0.1)
Basophils Relative: 0 %
EOS ABS: 0.1 10*3/uL (ref 0.0–0.7)
EOS PCT: 2 %
HCT: 39.7 % (ref 36.0–46.0)
HEMOGLOBIN: 13.5 g/dL (ref 12.0–15.0)
LYMPHS ABS: 1.9 10*3/uL (ref 0.7–4.0)
LYMPHS PCT: 24 %
MCH: 30.6 pg (ref 26.0–34.0)
MCHC: 34 g/dL (ref 30.0–36.0)
MCV: 90 fL (ref 78.0–100.0)
MONOS PCT: 10 %
Monocytes Absolute: 0.8 10*3/uL (ref 0.1–1.0)
Neutro Abs: 5.3 10*3/uL (ref 1.7–7.7)
Neutrophils Relative %: 64 %
PLATELETS: 235 10*3/uL (ref 150–400)
RBC: 4.41 MIL/uL (ref 3.87–5.11)
RDW: 12.9 % (ref 11.5–15.5)
WBC: 8.1 10*3/uL (ref 4.0–10.5)

## 2015-04-29 LAB — BASIC METABOLIC PANEL WITH GFR
Anion gap: 12 (ref 5–15)
BUN: 5 mg/dL — ABNORMAL LOW (ref 6–20)
CO2: 24 mmol/L (ref 22–32)
Calcium: 9.9 mg/dL (ref 8.9–10.3)
Chloride: 102 mmol/L (ref 101–111)
Creatinine, Ser: 0.8 mg/dL (ref 0.44–1.00)
GFR calc Af Amer: >60 mL/min (ref >60)
GFR calc non Af Amer: >60 mL/min (ref >60)
Glucose, Bld: 113 mg/dL — ABNORMAL HIGH (ref 65–99)
Potassium: 3.2 mmol/L — ABNORMAL LOW (ref 3.5–5.1)
Sodium: 138 mmol/L (ref 135–145)

## 2015-04-29 MED ORDER — MORPHINE SULFATE (PF) 4 MG/ML IV SOLN
4.0000 mg | Freq: Once | INTRAVENOUS | Status: AC
  Administered 2015-04-29: 4 mg via INTRAVENOUS
  Filled 2015-04-29: qty 1

## 2015-04-29 MED ORDER — IBUPROFEN 400 MG PO TABS
400.0000 mg | ORAL_TABLET | Freq: Once | ORAL | Status: AC
  Administered 2015-04-29: 400 mg via ORAL
  Filled 2015-04-29: qty 1

## 2015-04-29 MED ORDER — HYDROCODONE-ACETAMINOPHEN 5-325 MG PO TABS: 1.0000 | ORAL_TABLET | ORAL | Status: DC | PRN

## 2015-04-29 NOTE — ED Notes (Signed)
Ordered reg dinner tray

## 2015-04-29 NOTE — Consult Note (Signed)
Reason for Consult:Rib fractures after fall Referring Physician: Dr. Gerri SporeLittle  Terri King is an 81 y.o. female.  HPI: Patient fell 4 days ago off of a short, 2 step, step-ladder onto her left side.  Pain has been getting worse since that time.  Drove herself to the ED where she was found to have 3 rib fractures on the left side with a small hemothorax, no pnemothorax.  Past Medical History  Diagnosis Date  . History of pneumonia 2008  . Hyperlipemia   . Osteoarthritis   . MI (myocardial infarction) (HCC)     DR Tenny Crawoss  . HTN (hypertension)     dr Posey Reaplotnikov  . CAD (coronary artery disease)     DR Tenny Crawoss; by notes normal cath in 1986, normal stress 2013  . Family history of anesthesia complication     NIENCE had sensitivity  . Shortness of breath     Past Surgical History  Procedure Laterality Date  . Tonsillectomy    . Partial hysterectomy    . Knee arthroscopy      left  . Total knee arthroplasty  01/23/2012    Procedure: TOTAL KNEE ARTHROPLASTY;  Surgeon: Loreta Aveaniel F Murphy, MD;  [facility];  Service: Orthopedics;  Laterality: Left;  . Total knee arthroplasty  12/'05/2011    left knee  . Joint replacement  12/13    L TKR     Family History  Problem Relation Age of Onset  . Hypertension    . Glaucoma Father   . Hypertension Mother     Social History:  reports that she has never smoked. She has never used smokeless tobacco. She reports that she does not drink alcohol or use illicit drugs.  Allergies:  Allergies  Allergen Reactions  . Codeine Other (See Comments)    Abnormal behavior  . Crestor [Rosuvastatin]     Myalgias   . Dexilant [Dexlansoprazole]     Made me sick  . Gabapentin     REACTION: nausea  . Ibuprofen Nausea And Vomiting  . Simvastatin     REACTION: achy  . Tylenol [Acetaminophen]   . Iodine Swelling and Rash    Medications: Prior to Admission:   acyclovir (ZOVIRAX) 400 MG tablet TAKE 1 TABLET THREE TIMES DAILY  ALPRAZolam (XANAX) 0.25  MG tablet Take 1 tablet (0.25 mg total) by mouth 2 (two) times daily as needed for anxiety amLODipine (NORVASC) 5 MG tablet Take 1 tablet (5 mg total) by mouth daily. cholecalciferol (VITAMIN D) 400 UNITS TABS Take 400 Units by mouth daily. escitalopram (LEXAPRO) 10 MG tablet Take 1 tablet (10 mg total) by mouth daily.  gabapentin (NEURONTIN) 100 MG capsule Take 1 capsule (100 mg total) by mouth 3 (three) times daily.  HYDROcodone-acetaminophen (NORCO) 10-325 MG per tablet take 1 to 2 tablets by mouth every 8 hours if needed for pain  ibuprofen (ADVIL,MOTRIN) 600 MG tablet TAKE 1 TABLET EVERY 8 HOURS AS NEEDED FOR PAIN (SUBSTITUTED FOR MOTRIN)  Krill Oil (MAXIMUM RED KRILL) 300 MG CAPS 1 po qd metoprolol succinate (TOPROL-XL) 50 MG 24 hr tablet Take 1 tablet (50 mg total) by mouth at bedtime.  potassium chloride SA (K-DUR,KLOR-CON) 20 MEQ tablet Take 1 tablet (20 mEq total) by mouth daily ranitidine (ZANTAC) 150 MG tablet Take 1 tablet (150 mg total) by mouth 2 (two) times daily rosuvastatin (CRESTOR) 5 MG tablet Take 1 tablet (5 mg total) by mouth daily. temazepam (RESTORIL) 30 MG capsule Take 1 capsule (30 mg total)  by mouth at bedtime as needed for sleep triamterene-hydrochlorothiazide (MAXZIDE-25) 37.5-25 MG per tablet Take 1 tablet by mouth daily. vitamin B-12 (CYANOCOBALAMIN) 100 MCG tablet Take 100 mcg by mouth daily.    Results for orders placed or performed during the hospital encounter of 04/29/15 (from the past 48 hour(s))  CBC with Differential     Status: None   Collection Time: 04/29/15  3:55 PM  Result Value Ref Range   WBC 8.1 4.0 - 10.5 K/uL   RBC 4.41 3.87 - 5.11 MIL/uL   Hemoglobin 13.5 12.0 - 15.0 g/dL   HCT 16.1 09.6 - 04.5 %   MCV 90.0 78.0 - 100.0 fL   MCH 30.6 26.0 - 34.0 pg   MCHC 34.0 30.0 - 36.0 g/dL   RDW 40.9 81.1 - 91.4 %   Platelets 235 150 - 400 K/uL   Neutrophils Relative % 64 %   Neutro Abs 5.3 1.7 - 7.7 K/uL   Lymphocytes Relative 24 %   Lymphs Abs  1.9 0.7 - 4.0 K/uL   Monocytes Relative 10 %   Monocytes Absolute 0.8 0.1 - 1.0 K/uL   Eosinophils Relative 2 %   Eosinophils Absolute 0.1 0.0 - 0.7 K/uL   Basophils Relative 0 %   Basophils Absolute 0.0 0.0 - 0.1 K/uL    Dg Chest 2 View  04/29/2015  CLINICAL DATA:  81 year old female chest pain following fall 4 days ago. Initial encounter. EXAM: CHEST  2 VIEW COMPARISON:  01/18/2012 and prior radiographs FINDINGS: The cardiomediastinal silhouette is unremarkable. Bibasilar subsegmental atelectasis is noted. A very small left pleural effusion is present. Fractures of left lateral fifth through eighth ribs noted. There is no evidence of pneumothorax or pulmonary edema. Degenerative changes within both shoulders again noted. IMPRESSION: Fractures of the left 5th-8th ribs with mild bibasilar atelectasis and very small left pleural effusion. No evidence of pneumothorax. Electronically Signed   By: Harmon Pier M.D.   On: 04/29/2015 13:09   Dg Cervical Spine 2-3 Views  04/29/2015  CLINICAL DATA:  Status post fall, neck pain EXAM: CERVICAL SPINE - 2-3 VIEW COMPARISON:  None. FINDINGS: There is no evidence of cervical spine fracture or prevertebral soft tissue swelling. Alignment is normal. No other significant bone abnormalities are identified. There is bilateral carotid artery atherosclerosis. IMPRESSION: No acute osseous injury of the cervical spine. Electronically Signed   By: Elige Ko   On: 04/29/2015 13:05   Dg Thoracic Spine 2 View  04/29/2015  CLINICAL DATA:  Status post fall, thoracic pain EXAM: THORACIC SPINE 2 VIEWS COMPARISON:  None. FINDINGS: There is no evidence of thoracic spine fracture. Alignment is normal. No other significant bone abnormalities are identified. There is generalized osteopenia. There is a dextrocurvature of the thoracolumbar spine. There is thoracic aortic atherosclerosis. IMPRESSION: No acute osseous injury of the thoracic spine. Electronically Signed   By: Elige Ko    On: 04/29/2015 13:07   Ct Chest Wo Contrast  04/29/2015  CLINICAL DATA:  Larey Seat from step ladder onto left side 4 days ago with severe left rib pain EXAM: CT CHEST WITHOUT CONTRAST TECHNIQUE: Multidetector CT imaging of the chest was performed following the standard protocol without IV contrast. COMPARISON:  04/29/2015 radiograph showing left rib fractures FINDINGS: Mediastinum/Nodes: Thoracic inlet is normal. Thyroid is normal. Calcification at the origin of the left subclavian artery. Calcification of the thoracic aorta and mitral valve as well as coronary arteries. No pericardial effusion. Small hiatal hernia. No significant hilar or mediastinal  adenopathy. Lungs/Pleura: There is a small left pleural effusion. This may represent a hemothorax. There is mild bilateral lower lobe atelectasis. There is mild atelectasis in the inferior lingula. There is no pneumothorax. Upper abdomen: Numerous partially visualized gallstones. Musculoskeletal: Mildly displaced fractures of the lateral sixth, seventh, and eighth left ribs. No other acute findings. IMPRESSION: Left rib fractures with small hemothorax. Electronically Signed   By: Esperanza Heir M.D.   On: 04/29/2015 15:06    Review of Systems  Constitutional: Negative.   Respiratory: Negative for cough, hemoptysis and shortness of breath.   Cardiovascular: Positive for chest pain (left chest wall).  All other systems reviewed and are negative.  Blood pressure 169/66, pulse 78, temperature 98.8 F (37.1 C), temperature source Oral, resp. rate 16, weight 82.645 kg (182 lb 3.2 oz), SpO2 99 %. Physical Exam  Vitals reviewed. Constitutional: She is oriented to person, place, and time. She appears well-developed and well-nourished.  Unbelievably yound looking female  HENT:  Head: Normocephalic and atraumatic.  Eyes: EOM are normal. Pupils are equal, round, and reactive to light.  Neck: Normal range of motion. Neck supple.  Cardiovascular: Normal rate and  regular rhythm.   Respiratory: Effort normal and breath sounds normal. No respiratory distress. She has no decreased breath sounds. She has no rales. She exhibits tenderness (see diagram). She exhibits no crepitus.    Talking in normal sentences.  Pulled 1500cc with IS after MS 4 mg.  GI: Soft. Bowel sounds are normal.  Musculoskeletal: Normal range of motion.  Neurological: She is alert and oriented to person, place, and time. She has normal reflexes.  Skin: Skin is warm and dry.  Psychiatric: She has a normal mood and affect. Her behavior is normal. Judgment and thought content normal.    Assessment/Plan: Fall off step ladder Three left rib fractures with small hemothorax after 4 days at home and no pneumothorax  I coached the patient with IS and got volum up to 1500 (she had received MSO4 4 mg prior to IS).  She was not short of breath.  Talked to her about going home with adequate pain contrl either with tramadol, hydrocodone, or oxycodone in addition to ibuprofen.  She is agreeable to this .  She needs to have someone come in to see her daily for check.  She drove herself here, but has friends that can take her home.  She should come to get her car later.  I discussed this with Dr. Clarene Duke in the ED.  Jimmye Norman 04/29/2015, 4:31 PM

## 2015-04-29 NOTE — ED Provider Notes (Signed)
CSN: [id number]     Arrival date & time 04/29/15  3570 History   First MD Initiated Contact with Patient 04/29/15 1131     Chief Complaint  Patient presents with  . Fall     (Consider location/radiation/quality/duration/timing/severity/associated sxs/prior Treatment) HPI Comments: 81 year old female with past medical history including CAD status post MI, hypertension, hyperlipidemia who presents with left rib pain. 4 days ago, the patient fell off a step stool and landed on her left side and left back on a coffee table. She has had persistent left lateral and posterior rib pain since that time. Her pain is severe and worse with movement and deep breathing. She also reports bilateral shoulder pain. No midline neck pain but she states her sides of her neck or stiff. She denies any extremity numbness or weakness. No vomiting or visual changes. She denies hitting her head or loss of consciousness. She's been taking ibuprofen at home without much relief. Last dose of ibuprofen was last night. No low back pain and no anterior chest pain.  Patient is a 81 y.o. female presenting with fall. The history is provided by the patient.  Fall    Past Medical History  Diagnosis Date  . History of pneumonia 2008  . Hyperlipemia   . Osteoarthritis   . MI (myocardial infarction) (HCC)     DR Tenny Craw  . HTN (hypertension)     dr Posey Rea  . CAD (coronary artery disease)     DR Tenny Craw; by notes normal cath in 1986, normal stress 2013  . Family history of anesthesia complication     NIENCE had sensitivity  . Shortness of breath    Past Surgical History  Procedure Laterality Date  . Tonsillectomy    . Partial hysterectomy    . Knee arthroscopy      left  . Total knee arthroplasty  01/23/2012    Procedure: TOTAL KNEE ARTHROPLASTY;  Surgeon: Loreta Ave, MD;  Location: [facility] OR;  Service: Orthopedics;  Laterality: Left;  . Total knee arthroplasty  12/'05/2011    left knee  . Joint replacement  12/13     L TKR    Family History  Problem Relation Age of Onset  . Hypertension    . Glaucoma Father   . Hypertension Mother    Social History  Substance Use Topics  . Smoking status: Never Smoker   . Smokeless tobacco: Never Used  . Alcohol Use: No   OB History    No data available     Review of Systems 10 Systems reviewed and are negative for acute change except as noted in the HPI.    Allergies  Codeine; Crestor; Dexilant; Gabapentin; Ibuprofen; Simvastatin; Tylenol; and Iodine  Home Medications   Prior to Admission medications   Medication Sig Start Date End Date Taking? Authorizing Provider  acyclovir (ZOVIRAX) 400 MG tablet TAKE 1 TABLET THREE TIMES DAILY 12/29/14   Tresa Garter, MD  ALPRAZolam (XANAX) 0.25 MG tablet Take 1 tablet (0.25 mg total) by mouth 2 (two) times daily as needed for anxiety. 12/29/14   Aleksei [name] V, MD  amLODipine (NORVASC) 5 MG tablet Take 1 tablet (5 mg total) by mouth daily. 09/21/14   Aleksei [name] V, MD  cholecalciferol (VITAMIN D) 400 UNITS TABS Take 400 Units by mouth daily.    Historical Provider, MD  escitalopram (LEXAPRO) 10 MG tablet Take 1 tablet (10 mg total) by mouth daily. 09/16/14   Aleksei [name] V, MD  gabapentin (NEURONTIN)  100 MG capsule Take 1 capsule (100 mg total) by mouth 3 (three) times daily. 09/16/14   Aleksei [name] V, MD  HYDROcodone-acetaminophen (NORCO) 10-325 MG per tablet take 1 to 2 tablets by mouth every 8 hours if needed for  pain 05/27/14   Aleksei [name] V, MD  ibuprofen (ADVIL,MOTRIN) 600 MG tablet TAKE 1 TABLET EVERY 8 HOURS AS NEEDED  FOR  PAIN (SUBSTITUTED FOR MOTRIN) 04/12/15   Tresa Garter, MD  Krill Oil (MAXIMUM RED KRILL) 300 MG CAPS 1 po qd 07/22/13   Aleksei [name] V, MD  metoprolol succinate (TOPROL-XL) 50 MG 24 hr tablet Take 1 tablet (50 mg total) by mouth at bedtime. 09/16/14 09/16/15  Etta Grandchild, MD  potassium chloride SA (K-DUR,KLOR-CON) 20 MEQ tablet Take 1 tablet (20 mEq  total) by mouth daily. 09/16/14   Etta Grandchild, MD  ranitidine (ZANTAC) 150 MG tablet Take 1 tablet (150 mg total) by mouth 2 (two) times daily. 09/16/14   Aleksei [name] V, MD  rosuvastatin (CRESTOR) 5 MG tablet Take 1 tablet (5 mg total) by mouth daily. 09/16/14   Aleksei [name] V, MD  temazepam (RESTORIL) 30 MG capsule Take 1 capsule (30 mg total) by mouth at bedtime as needed for sleep. 03/21/15   Aleksei [name] V, MD  triamterene-hydrochlorothiazide (MAXZIDE-25) 37.5-25 MG per tablet Take 1 tablet by mouth daily. 09/16/14   Aleksei [name] V, MD  vitamin B-12 (CYANOCOBALAMIN) 100 MCG tablet Take 100 mcg by mouth daily.    Historical Provider, MD   BP 169/66 mmHg  Pulse 78  Temp(Src) 98.8 F (37.1 C) (Oral)  Resp 16  Wt 182 lb 3.2 oz (82.645 kg)  SpO2 99% Physical Exam  Constitutional: She is oriented to person, place, and time. She appears well-developed and well-nourished. No distress.  uncomfortable  HENT:  Head: Normocephalic and atraumatic.  Moist mucous membranes  Eyes: Conjunctivae are normal. Pupils are equal, round, and reactive to light.  Neck: Neck supple.  Cardiovascular: Normal rate, regular rhythm and normal heart sounds.   No murmur heard. Pulmonary/Chest: Effort normal and breath sounds normal.  Left lateral rib TTP, no crepitus  Abdominal: Soft. Bowel sounds are normal. She exhibits no distension. There is no tenderness.  Musculoskeletal: She exhibits no edema.  No midline spinal tenderness, L thoracic back tenderness over ribs, no crepitus or obvious deformity  Neurological: She is alert and oriented to person, place, and time.  5/5 b/l grip strength, normal sensation throughout, Fluent speech  Skin: Skin is warm and dry.  No ecchymoses  Psychiatric: She has a normal mood and affect. Judgment normal.  Nursing note and vitals reviewed.   ED Course  Procedures (including critical care time) Labs Review Labs Reviewed  CBC WITH DIFFERENTIAL/PLATELET   BASIC METABOLIC PANEL    Imaging Review Dg Chest 2 View  04/29/2015  CLINICAL DATA:  81 year old female chest pain following fall 4 days ago. Initial encounter. EXAM: CHEST  2 VIEW COMPARISON:  01/18/2012 and prior radiographs FINDINGS: The cardiomediastinal silhouette is unremarkable. Bibasilar subsegmental atelectasis is noted. A very small left pleural effusion is present. Fractures of left lateral fifth through eighth ribs noted. There is no evidence of pneumothorax or pulmonary edema. Degenerative changes within both shoulders again noted. IMPRESSION: Fractures of the left 5th-8th ribs with mild bibasilar atelectasis and very small left pleural effusion. No evidence of pneumothorax. Electronically Signed   By: Harmon Pier M.D.   On: 04/29/2015 13:09   Dg Cervical Spine 2-3  Views  04/29/2015  CLINICAL DATA:  Status post fall, neck pain EXAM: CERVICAL SPINE - 2-3 VIEW COMPARISON:  None. FINDINGS: There is no evidence of cervical spine fracture or prevertebral soft tissue swelling. Alignment is normal. No other significant bone abnormalities are identified. There is bilateral carotid artery atherosclerosis. IMPRESSION: No acute osseous injury of the cervical spine. Electronically Signed   By: Elige KoHetal  Patel   On: 04/29/2015 13:05   Dg Thoracic Spine 2 View  04/29/2015  CLINICAL DATA:  Status post fall, thoracic pain EXAM: THORACIC SPINE 2 VIEWS COMPARISON:  None. FINDINGS: There is no evidence of thoracic spine fracture. Alignment is normal. No other significant bone abnormalities are identified. There is generalized osteopenia. There is a dextrocurvature of the thoracolumbar spine. There is thoracic aortic atherosclerosis. IMPRESSION: No acute osseous injury of the thoracic spine. Electronically Signed   By: Elige KoHetal  Patel   On: 04/29/2015 13:07   Ct Chest Wo Contrast  04/29/2015  CLINICAL DATA:  Larey SeatFell from step ladder onto left side 4 days ago with severe left rib pain EXAM: CT CHEST WITHOUT CONTRAST  TECHNIQUE: Multidetector CT imaging of the chest was performed following the standard protocol without IV contrast. COMPARISON:  04/29/2015 radiograph showing left rib fractures FINDINGS: Mediastinum/Nodes: Thoracic inlet is normal. Thyroid is normal. Calcification at the origin of the left subclavian artery. Calcification of the thoracic aorta and mitral valve as well as coronary arteries. No pericardial effusion. Small hiatal hernia. No significant hilar or mediastinal adenopathy. Lungs/Pleura: There is a small left pleural effusion. This may represent a hemothorax. There is mild bilateral lower lobe atelectasis. There is mild atelectasis in the inferior lingula. There is no pneumothorax. Upper abdomen: Numerous partially visualized gallstones. Musculoskeletal: Mildly displaced fractures of the lateral sixth, seventh, and eighth left ribs. No other acute findings. IMPRESSION: Left rib fractures with small hemothorax. Electronically Signed   By: Esperanza Heiraymond  Rubner M.D.   On: 04/29/2015 15:06      EKG Interpretation None     Medications  ibuprofen (ADVIL,MOTRIN) tablet 400 mg (400 mg Oral Given 04/29/15 1216)  morphine 4 MG/ML injection 4 mg (4 mg Intravenous Given 04/29/15 1600)    MDM   Final diagnoses:  Rib fractures, left, closed, initial encounter   Pt w/ left lateral rib pain and left upper back pain after falling backwards onto a coffee table 4 days ago. On exam, she was uncomfortable but in no acute distress. Vital signs stable. She had chest wall tenderness along her left lateral chest and left upper back. No neurologic deficits. Obtained plain films of cervical and thoracic spine as well as chest x-ray to rule out acute injury. Gave ibuprofen and later gave morphine for ongoing pain.  Chest x-ray showed multiple rib fractures and pleural effusion. Obtained chest CT for better evaluation. CT shows 3 left-sided rib fractures with small hemothorax. Consulted trauma surgery for evaluation. Dr.  Lindie SpruceWyatt evaluated patient, I appreciate his assistance. She was able to pull >1000 on IS and given her well appearance, normal O2 saturation, and length of time since initial injury, he felt that she could go home with oral pain control and strict return precautions. I reviewed this information with the patient and because she has tolerated hydrocodone in the past, provided with a prescription for Vicodin. She has normal kidney function on recent lab work, therefore also recommended ibuprofen twice daily for a few days. I extensively reviewed return precautions including fever, shortness of breath, or any worsening symptoms. The patient  voiced understanding and was discharged in satisfactory condition.  Laurence Spates, MD 04/29/15 [phone]

## 2015-04-29 NOTE — ED Notes (Signed)
Patient states fell off of a stepstool Monday night and landed on her coffee table on her back.   Patient complains of back pain, and bilateral shoulder pain.   Patient denies hitting head and LOC.   Patient states she has been taking ibuprofen at home for the pain, but no relief.

## 2015-04-29 NOTE — ED Notes (Addendum)
Pt has eaten her dinner without difficulty. Son called, can pick her up at 9pm after he gets off.  O2 sat has remained 96-98%.  Is alert and appropriate, very pleasant.

## 2015-05-09 ENCOUNTER — Telehealth: Payer: Self-pay | Admitting: Internal Medicine

## 2015-05-09 NOTE — Telephone Encounter (Signed)
Sure. [facility] [employer]

## 2015-05-09 NOTE — Telephone Encounter (Signed)
Please see encounter below

## 2015-05-09 NOTE — Telephone Encounter (Signed)
Patient had fallen and she has some broken ribs and she was hoping to change her appointment on the 29th to the 24th because her son will be in town and can bring her.  Please advise

## 2015-05-10 NOTE — Telephone Encounter (Signed)
Called pt inform her md ok for her to come on 24th. appt was reschedule...Raechel Chute/lmb

## 2015-05-13 ENCOUNTER — Telehealth: Payer: Self-pay | Admitting: Internal Medicine

## 2015-05-13 ENCOUNTER — Encounter: Payer: Self-pay | Admitting: Internal Medicine

## 2015-05-13 ENCOUNTER — Ambulatory Visit (INDEPENDENT_AMBULATORY_CARE_PROVIDER_SITE_OTHER): Payer: Commercial Managed Care - HMO | Admitting: Internal Medicine

## 2015-05-13 ENCOUNTER — Ambulatory Visit (INDEPENDENT_AMBULATORY_CARE_PROVIDER_SITE_OTHER)
Admission: RE | Admit: 2015-05-13 | Discharge: 2015-05-13 | Disposition: A | Discharge status: Home / Self Care | Payer: Commercial Managed Care - HMO | Source: Ambulatory Visit | Attending: Internal Medicine | Admitting: Internal Medicine

## 2015-05-13 DIAGNOSIS — R4182 Altered mental status, unspecified: Secondary | ICD-10-CM | POA: Diagnosis not present

## 2015-05-13 DIAGNOSIS — R413 Other amnesia: Secondary | ICD-10-CM

## 2015-05-13 DIAGNOSIS — W1809XS Striking against other object with subsequent fall, sequela: Secondary | ICD-10-CM

## 2015-05-13 MED ORDER — TEMAZEPAM 30 MG PO CAPS: 30.0000 mg | ORAL_CAPSULE | Freq: Every evening | ORAL | Status: DC | PRN

## 2015-05-13 MED ORDER — ALPRAZOLAM 0.25 MG PO TABS: 0.1250 mg | ORAL_TABLET | Freq: Two times a day (BID) | ORAL | Status: DC | PRN

## 2015-05-13 NOTE — Telephone Encounter (Addendum)
No acute brain abnormality on head CT. There is some hardening of the blood vessels in the brain - age related. Thx

## 2015-05-13 NOTE — Progress Notes (Signed)
Subjective:  Patient ID: Terri King, female    DOB: 30-Sep-1934  Age: 81 y.o. MRN: [id number]  CC: No chief complaint on file.   HPI Terri King presents for rib fx - no recollection of the fall   "Patient fell 4 days ago off of a short, 2 step, step-ladder onto her left side. Pain has been getting worse since that time. Drove herself to the ED where she was found to have 3 rib fractures on the left side with a small hemothorax, no pnemothorax." Pt has no recollection of the fall.    Outpatient Prescriptions Prior to Visit  Medication Sig Dispense Refill  . acyclovir (ZOVIRAX) 400 MG tablet TAKE 1 TABLET THREE TIMES DAILY 21 tablet 3  . ALPRAZolam (XANAX) 0.25 MG tablet Take 1 tablet (0.25 mg total) by mouth 2 (two) times daily as needed for anxiety. 180 tablet 1  . amLODipine (NORVASC) 5 MG tablet Take 1 tablet (5 mg total) by mouth daily. 90 tablet 3  . cholecalciferol (VITAMIN D) 400 UNITS TABS Take 400 Units by mouth daily.    Marland Kitchen escitalopram (LEXAPRO) 10 MG tablet Take 1 tablet (10 mg total) by mouth daily. 90 tablet 3  . gabapentin (NEURONTIN) 100 MG capsule Take 1 capsule (100 mg total) by mouth 3 (three) times daily. 270 capsule 3  . HYDROcodone-acetaminophen (NORCO/VICODIN) 5-325 MG tablet Take 1-2 tablets by mouth every 4 (four) hours as needed for severe pain. 20 tablet 0  . ibuprofen (ADVIL,MOTRIN) 600 MG tablet TAKE 1 TABLET EVERY 8 HOURS AS NEEDED  FOR  PAIN (SUBSTITUTED FOR MOTRIN) 90 tablet 0  . Krill Oil (MAXIMUM RED KRILL) 300 MG CAPS 1 po qd 100 capsule 3  . metoprolol succinate (TOPROL-XL) 50 MG 24 hr tablet Take 1 tablet (50 mg total) by mouth at bedtime. 90 tablet 3  . potassium chloride SA (K-DUR,KLOR-CON) 20 MEQ tablet Take 1 tablet (20 mEq total) by mouth daily. 90 tablet 3  . ranitidine (ZANTAC) 150 MG tablet Take 1 tablet (150 mg total) by mouth 2 (two) times daily. 180 tablet 3  . rosuvastatin (CRESTOR) 5 MG tablet Take 1 tablet (5 mg total) by mouth  daily. 90 tablet 3  . temazepam (RESTORIL) 30 MG capsule Take 1 capsule (30 mg total) by mouth at bedtime as needed for sleep. 90 capsule 1  . triamterene-hydrochlorothiazide (MAXZIDE-25) 37.5-25 MG per tablet Take 1 tablet by mouth daily. 90 tablet 3  . vitamin B-12 (CYANOCOBALAMIN) 100 MCG tablet Take 100 mcg by mouth daily.     No facility-administered medications prior to visit.    ROS Review of Systems  Constitutional: Negative for chills, activity change, appetite change, fatigue and unexpected weight change.  HENT: Negative for congestion, mouth sores and sinus pressure.   Eyes: Negative for visual disturbance.  Respiratory: Negative for cough and chest tightness.   Cardiovascular: Positive for chest pain.  Gastrointestinal: Negative for nausea and abdominal pain.  Genitourinary: Negative for frequency, difficulty urinating and vaginal pain.  Musculoskeletal: Positive for arthralgias and gait problem. Negative for back pain.  Skin: Negative for pallor and rash.  Neurological: Negative for dizziness, tremors, weakness, numbness and headaches.  Psychiatric/Behavioral: Positive for sleep disturbance. Negative for suicidal ideas, confusion and decreased concentration. The patient is nervous/anxious.     Objective:  BP 130/68 mmHg  Pulse 63  Wt 179 lb (81.194 kg)  SpO2 97%  BP Readings from Last 3 Encounters:  05/13/15 130/68  04/29/15 123/54  12/29/14 148/64    Wt Readings from Last 3 Encounters:  05/13/15 179 lb (81.194 kg)  04/29/15 182 lb 3.2 oz (82.645 kg)  12/29/14 183 lb (83.008 kg)    Physical Exam  Constitutional: She appears well-developed. No distress.  HENT:  Head: Normocephalic.  Right Ear: External ear normal.  Left Ear: External ear normal.  Nose: Nose normal.  Mouth/Throat: Oropharynx is clear and moist.  Eyes: Conjunctivae are normal. Pupils are equal, round, and reactive to light. Right eye exhibits no discharge. Left eye exhibits no discharge.    Neck: Normal range of motion. Neck supple. No JVD present. No tracheal deviation present. No thyromegaly present.  Cardiovascular: Normal rate, regular rhythm and normal heart sounds.   Pulmonary/Chest: No stridor. No respiratory distress. She has no wheezes.  Abdominal: Soft. Bowel sounds are normal. She exhibits no distension and no mass. There is no tenderness. There is no rebound and no guarding.  Musculoskeletal: She exhibits tenderness. She exhibits no edema.  Lymphadenopathy:    She has no cervical adenopathy.  Neurological: She displays normal reflexes. No cranial nerve deficit. She exhibits normal muscle tone. Coordination normal.  Skin: No rash noted. No erythema.  Psychiatric: She has a normal mood and affect. Her behavior is normal. Judgment and thought content normal.    Lab Results  Component Value Date   WBC 8.1 04/29/2015   HGB 13.5 04/29/2015   HCT 39.7 04/29/2015   PLT 235 04/29/2015   GLUCOSE 113* 04/29/2015   CHOL 181 12/23/2014   TRIG 179.0* 12/23/2014   HDL 57.00 12/23/2014   LDLDIRECT 186.1 03/24/2013   LDLCALC 89 12/23/2014   ALT 11 12/23/2014   AST 20 12/23/2014   NA 138 04/29/2015   K 3.2* 04/29/2015   CL 102 04/29/2015   CREATININE 0.80 04/29/2015   BUN 5* 04/29/2015   CO2 24 04/29/2015   TSH 2.42 08/11/2014   INR 1.44 01/25/2012   HGBA1C 5.7 12/23/2014    Dg Chest 2 View  04/29/2015  CLINICAL DATA:  81 year old female chest pain following fall 4 days ago. Initial encounter. EXAM: CHEST  2 VIEW COMPARISON:  01/18/2012 and prior radiographs FINDINGS: The cardiomediastinal silhouette is unremarkable. Bibasilar subsegmental atelectasis is noted. A very small left pleural effusion is present. Fractures of left lateral fifth through eighth ribs noted. There is no evidence of pneumothorax or pulmonary edema. Degenerative changes within both shoulders again noted. IMPRESSION: Fractures of the left 5th-8th ribs with mild bibasilar atelectasis and very small  left pleural effusion. No evidence of pneumothorax. Electronically Signed   By: Harmon Pier M.D.   On: 04/29/2015 13:09   Dg Cervical Spine 2-3 Views  04/29/2015  CLINICAL DATA:  Status post fall, neck pain EXAM: CERVICAL SPINE - 2-3 VIEW COMPARISON:  None. FINDINGS: There is no evidence of cervical spine fracture or prevertebral soft tissue swelling. Alignment is normal. No other significant bone abnormalities are identified. There is bilateral carotid artery atherosclerosis. IMPRESSION: No acute osseous injury of the cervical spine. Electronically Signed   By: Elige Ko   On: 04/29/2015 13:05   Dg Thoracic Spine 2 View  04/29/2015  CLINICAL DATA:  Status post fall, thoracic pain EXAM: THORACIC SPINE 2 VIEWS COMPARISON:  None. FINDINGS: There is no evidence of thoracic spine fracture. Alignment is normal. No other significant bone abnormalities are identified. There is generalized osteopenia. There is a dextrocurvature of the thoracolumbar spine. There is thoracic aortic atherosclerosis. IMPRESSION: No acute osseous injury of the thoracic  spine. Electronically Signed   By: Elige KoHetal  Patel   On: 04/29/2015 13:07   Ct Chest Wo Contrast  04/29/2015  CLINICAL DATA:  Larey SeatFell from step ladder onto left side 4 days ago with severe left rib pain EXAM: CT CHEST WITHOUT CONTRAST TECHNIQUE: Multidetector CT imaging of the chest was performed following the standard protocol without IV contrast. COMPARISON:  04/29/2015 radiograph showing left rib fractures FINDINGS: Mediastinum/Nodes: Thoracic inlet is normal. Thyroid is normal. Calcification at the origin of the left subclavian artery. Calcification of the thoracic aorta and mitral valve as well as coronary arteries. No pericardial effusion. Small hiatal hernia. No significant hilar or mediastinal adenopathy. Lungs/Pleura: There is a small left pleural effusion. This may represent a hemothorax. There is mild bilateral lower lobe atelectasis. There is mild atelectasis in  the inferior lingula. There is no pneumothorax. Upper abdomen: Numerous partially visualized gallstones. Musculoskeletal: Mildly displaced fractures of the lateral sixth, seventh, and eighth left ribs. No other acute findings. IMPRESSION: Left rib fractures with small hemothorax. Electronically Signed   By: Esperanza Heiraymond  Rubner M.D.   On: 04/29/2015 15:06    Assessment & Plan:   There are no diagnoses linked to this encounter. I am having Ms. Tsuchiya maintain her vitamin B-12, cholecalciferol, MAXIMUM RED KRILL, rosuvastatin, escitalopram, gabapentin, ranitidine, triamterene-hydrochlorothiazide, metoprolol succinate, potassium chloride SA, amLODipine, acyclovir, ALPRAZolam, temazepam, ibuprofen, and HYDROcodone-acetaminophen.  No orders of the defined types were placed in this encounter.     Follow-up: No Follow-up on file.  Sonda PrimesAlex [name], MD

## 2015-05-13 NOTE — Progress Notes (Signed)
Pre visit review using our clinic review tool, if applicable. No additional management support is needed unless otherwise documented below in the visit note. 

## 2015-05-13 NOTE — Telephone Encounter (Signed)
Misty StanleyLisa, daughter, called request the CT result that was done today. She said that CT tech said they should have the result today.   # [phone]

## 2015-05-16 ENCOUNTER — Telehealth: Payer: Self-pay | Admitting: Internal Medicine

## 2015-05-16 NOTE — Telephone Encounter (Signed)
Is requesting call back with CT results  °

## 2015-05-17 NOTE — Telephone Encounter (Signed)
Notified pt & daughter w/ MD response.../lMB...Raechel Chute/lmb

## 2015-05-17 NOTE — Telephone Encounter (Signed)
See duplicate msg pt & daughter has been notified concerning results...Raechel Chute/lmb

## 2015-05-18 ENCOUNTER — Ambulatory Visit: Payer: Commercial Managed Care - HMO | Admitting: Internal Medicine

## 2015-05-31 ENCOUNTER — Other Ambulatory Visit: Payer: Self-pay | Admitting: Internal Medicine

## 2015-05-31 NOTE — Telephone Encounter (Signed)
Please advise, thanks.

## 2015-06-14 ENCOUNTER — Ambulatory Visit (INDEPENDENT_AMBULATORY_CARE_PROVIDER_SITE_OTHER): Payer: Commercial Managed Care - HMO | Admitting: Internal Medicine

## 2015-06-14 ENCOUNTER — Encounter: Payer: Self-pay | Admitting: Internal Medicine

## 2015-06-14 VITALS — BP 138/70 | HR 77 | Wt 185.0 lb

## 2015-06-14 DIAGNOSIS — I38 Endocarditis, valve unspecified: Secondary | ICD-10-CM

## 2015-06-14 DIAGNOSIS — M150 Primary generalized (osteo)arthritis: Secondary | ICD-10-CM

## 2015-06-14 DIAGNOSIS — G4700 Insomnia, unspecified: Secondary | ICD-10-CM | POA: Diagnosis not present

## 2015-06-14 DIAGNOSIS — I10 Essential (primary) hypertension: Secondary | ICD-10-CM

## 2015-06-14 DIAGNOSIS — R011 Cardiac murmur, unspecified: Secondary | ICD-10-CM | POA: Diagnosis not present

## 2015-06-14 DIAGNOSIS — M159 Polyosteoarthritis, unspecified: Secondary | ICD-10-CM

## 2015-06-14 NOTE — Progress Notes (Signed)
Pre visit review using our clinic review tool, if applicable. No additional management support is needed unless otherwise documented below in the visit note. 

## 2015-06-14 NOTE — Assessment & Plan Note (Signed)
Chronic  Temazepam at hs  Potential benefits of a long term benzodiazepines  use as well as potential risks  and complications were explained to the patient and were aknowledged.

## 2015-06-14 NOTE — Assessment & Plan Note (Signed)
Repeat ECHO.

## 2015-06-14 NOTE — Assessment & Plan Note (Signed)
Ibuprofen prn 

## 2015-06-14 NOTE — Progress Notes (Signed)
Subjective:  Patient ID: Terri King, female    DOB: 11-07-1934  Age: 81 y.o. MRN: [id number]  CC: No chief complaint on file.   HPI Terri King presents for a fall - no relapse F/u OA  Outpatient Prescriptions Prior to Visit  Medication Sig Dispense Refill  . acyclovir (ZOVIRAX) 400 MG tablet TAKE 1 TABLET THREE TIMES DAILY 21 tablet 3  . amLODipine (NORVASC) 5 MG tablet Take 1 tablet (5 mg total) by mouth daily. 90 tablet 3  . cholecalciferol (VITAMIN D) 400 UNITS TABS Take 400 Units by mouth daily.    Marland Kitchen. escitalopram (LEXAPRO) 10 MG tablet Take 1 tablet (10 mg total) by mouth daily. 90 tablet 3  . gabapentin (NEURONTIN) 100 MG capsule Take 1 capsule (100 mg total) by mouth 3 (three) times daily. 270 capsule 3  . ibuprofen (ADVIL,MOTRIN) 600 MG tablet TAKE 1 TABLET EVERY 8 HOURS AS NEEDED  FOR  PAIN (SUBSTITUTED FOR MOTRIN) 90 tablet 1  . Krill Oil (MAXIMUM RED KRILL) 300 MG CAPS 1 po qd 100 capsule 3  . metoprolol succinate (TOPROL-XL) 50 MG 24 hr tablet Take 1 tablet (50 mg total) by mouth at bedtime. 90 tablet 3  . potassium chloride SA (K-DUR,KLOR-CON) 20 MEQ tablet Take 1 tablet (20 mEq total) by mouth daily. 90 tablet 3  . ranitidine (ZANTAC) 150 MG tablet Take 1 tablet (150 mg total) by mouth 2 (two) times daily. 180 tablet 3  . rosuvastatin (CRESTOR) 5 MG tablet Take 1 tablet (5 mg total) by mouth daily. 90 tablet 3  . temazepam (RESTORIL) 30 MG capsule Take 1 capsule (30 mg total) by mouth at bedtime as needed for sleep. 90 capsule 1  . triamterene-hydrochlorothiazide (MAXZIDE-25) 37.5-25 MG per tablet Take 1 tablet by mouth daily. 90 tablet 3  . vitamin B-12 (CYANOCOBALAMIN) 100 MCG tablet Take 100 mcg by mouth daily.    Marland Kitchen. ALPRAZolam (XANAX) 0.25 MG tablet Take 0.5-1 tablets (0.125-0.25 mg total) by mouth 2 (two) times daily as needed for anxiety. (Patient not taking: Reported on 06/14/2015) 60 tablet 3   No facility-administered medications prior to visit.     ROS Review of Systems  Constitutional: Negative for chills, activity change, appetite change, fatigue and unexpected weight change.  HENT: Negative for congestion, mouth sores and sinus pressure.   Eyes: Negative for visual disturbance.  Respiratory: Negative for cough and chest tightness.   Gastrointestinal: Negative for nausea and abdominal pain.  Genitourinary: Negative for frequency, difficulty urinating and vaginal pain.  Musculoskeletal: Positive for arthralgias and gait problem. Negative for back pain.  Skin: Negative for pallor and rash.  Neurological: Negative for dizziness, tremors, weakness, numbness and headaches.  Psychiatric/Behavioral: Negative for confusion and sleep disturbance.    Objective:  BP 138/70 mmHg  Pulse 77  Wt 185 lb (83.915 kg)  SpO2 95%  BP Readings from Last 3 Encounters:  06/14/15 138/70  05/13/15 130/68  04/29/15 123/54    Wt Readings from Last 3 Encounters:  06/14/15 185 lb (83.915 kg)  05/13/15 179 lb (81.194 kg)  04/29/15 182 lb 3.2 oz (82.645 kg)    Physical Exam  Constitutional: She appears well-developed. No distress.  HENT:  Head: Normocephalic.  Right Ear: External ear normal.  Left Ear: External ear normal.  Nose: Nose normal.  Mouth/Throat: Oropharynx is clear and moist.  Eyes: Conjunctivae are normal. Pupils are equal, round, and reactive to light. Right eye exhibits no discharge. Left eye exhibits no discharge.  Neck: Normal range of motion. Neck supple. No JVD present. No tracheal deviation present. No thyromegaly present.  Cardiovascular: Normal rate and regular rhythm.   Murmur heard. Pulmonary/Chest: No stridor. No respiratory distress. She has no wheezes.  Abdominal: Soft. Bowel sounds are normal. She exhibits no distension and no mass. There is no tenderness. There is no rebound and no guarding.  Musculoskeletal: She exhibits no edema or tenderness.  Lymphadenopathy:    She has no cervical adenopathy.   Neurological: She displays normal reflexes. No cranial nerve deficit. She exhibits normal muscle tone. Coordination abnormal.  Skin: No rash noted. No erythema.  Psychiatric: She has a normal mood and affect. Her behavior is normal. Judgment and thought content normal.    Lab Results  Component Value Date   WBC 8.1 04/29/2015   HGB 13.5 04/29/2015   HCT 39.7 04/29/2015   PLT 235 04/29/2015   GLUCOSE 113* 04/29/2015   CHOL 181 12/23/2014   TRIG 179.0* 12/23/2014   HDL 57.00 12/23/2014   LDLDIRECT 186.1 03/24/2013   LDLCALC 89 12/23/2014   ALT 11 12/23/2014   AST 20 12/23/2014   NA 138 04/29/2015   K 3.2* 04/29/2015   CL 102 04/29/2015   CREATININE 0.80 04/29/2015   BUN 5* 04/29/2015   CO2 24 04/29/2015   TSH 2.42 08/11/2014   INR 1.44 01/25/2012   HGBA1C 5.7 12/23/2014    Ct Head Wo Contrast  05/13/2015  CLINICAL DATA:  Altered mental status. Memory loss after a fall on 04/25/2015. EXAM: CT HEAD WITHOUT CONTRAST TECHNIQUE: Contiguous axial images were obtained from the base of the skull through the vertex without intravenous contrast. COMPARISON:  None. FINDINGS: There is no acute intracranial hemorrhage or intracranial mass lesion. No definitive acute infarction. There is vague periventricular white matter lucency most prominent high in the left parietal lobe consistent with chronic small vessel ischemic disease. Bones are normal. IMPRESSION: No acute abnormality.  Slight chronic small vessel ischemic changes. Electronically Signed   By: Francene Boyers M.D.   On: 05/13/2015 14:14    Assessment & Plan:   There are no diagnoses linked to this encounter. I am having Terri King maintain her vitamin B-12, cholecalciferol, MAXIMUM RED KRILL, rosuvastatin, escitalopram, gabapentin, ranitidine, triamterene-hydrochlorothiazide, metoprolol succinate, potassium chloride SA, amLODipine, acyclovir, ALPRAZolam, temazepam, and ibuprofen.  No orders of the defined types were placed in this  encounter.     Follow-up: No Follow-up on file.  Sonda Primes, MD

## 2015-06-14 NOTE — Assessment & Plan Note (Signed)
Norvasc, ASA 81 

## 2015-07-08 ENCOUNTER — Other Ambulatory Visit: Payer: Self-pay | Admitting: Internal Medicine

## 2015-07-11 ENCOUNTER — Other Ambulatory Visit: Payer: Self-pay | Admitting: Internal Medicine

## 2015-08-08 ENCOUNTER — Ambulatory Visit (HOSPITAL_COMMUNITY): Payer: Commercial Managed Care - HMO | Attending: Internal Medicine

## 2015-08-08 ENCOUNTER — Other Ambulatory Visit: Payer: Self-pay

## 2015-08-08 DIAGNOSIS — I351 Nonrheumatic aortic (valve) insufficiency: Secondary | ICD-10-CM | POA: Diagnosis not present

## 2015-08-08 DIAGNOSIS — I10 Essential (primary) hypertension: Secondary | ICD-10-CM | POA: Diagnosis not present

## 2015-08-08 DIAGNOSIS — I340 Nonrheumatic mitral (valve) insufficiency: Secondary | ICD-10-CM | POA: Insufficient documentation

## 2015-08-08 DIAGNOSIS — R011 Cardiac murmur, unspecified: Secondary | ICD-10-CM | POA: Insufficient documentation

## 2015-08-08 LAB — ECHOCARDIOGRAM COMPLETE
AOPV: 0.33 m/s
AOVTI: 79.1 cm
AV Area VTI index: 0.58 cm2/m2
AV Area VTI: 1.03 cm2
AV Area mean vel: 1.05 cm2
AV area mean vel ind: 0.55 cm2/m2
AV vel: 1.09
AVA: 1.09 cm2
AVCELMEANRAT: 0.33
AVG: 29 mmHg
AVPG: 48 mmHg
AVPHT: 334 ms
AVPKVEL: 348 cm/s
CHL CUP AV PEAK INDEX: 0.54
CHL CUP AV VALUE AREA INDEX: 0.58
CHL CUP DOP CALC LVOT VTI: 27.4 cm
CHL CUP MV DEC (S): 264
CHL CUP TV REG PEAK VELOCITY: 244 cm/s
DOP CAL AO MEAN VELOCITY: 252 cm/s
E decel time: 264 msec
E/e' ratio: 20.27
FS: 27 % — AB (ref 28–44)
IVS/LV PW RATIO, ED: 1.23
LA ID, A-P, ES: 35 mm
LA diam end sys: 35 mm
LADIAMINDEX: 1.85 cm/m2
LAVOL: 44 mL
LAVOLA4C: 47 mL
LAVOLIN: 23.3 mL/m2
LV TDI E'LATERAL: 5.92
LVEEAVG: 20.27
LVEEMED: 20.27
LVELAT: 5.92 cm/s
LVOT area: 3.14 cm2
LVOT diameter: 20 mm
LVOT peak grad rest: 5 mmHg
LVOT peak vel: 114 cm/s
LVOTSV: 86 mL
LVOTVTI: 0.35 cm
MV Peak grad: 6 mmHg
MV pk E vel: 120 m/s
MVPKAVEL: 138 m/s
PW: 10.9 mm — AB (ref 0.6–1.1)
TDI e' medial: 6.36
TR max vel: 244 cm/s

## 2015-09-02 ENCOUNTER — Ambulatory Visit (INDEPENDENT_AMBULATORY_CARE_PROVIDER_SITE_OTHER): Payer: Commercial Managed Care - HMO | Admitting: Internal Medicine

## 2015-09-02 ENCOUNTER — Ambulatory Visit (INDEPENDENT_AMBULATORY_CARE_PROVIDER_SITE_OTHER)
Admission: RE | Admit: 2015-09-02 | Discharge: 2015-09-02 | Disposition: A | Discharge status: Home / Self Care | Payer: Commercial Managed Care - HMO | Source: Ambulatory Visit | Attending: Internal Medicine | Admitting: Internal Medicine

## 2015-09-02 ENCOUNTER — Encounter: Payer: Self-pay | Admitting: Internal Medicine

## 2015-09-02 ENCOUNTER — Other Ambulatory Visit (INDEPENDENT_AMBULATORY_CARE_PROVIDER_SITE_OTHER): Payer: Commercial Managed Care - HMO

## 2015-09-02 VITALS — BP 148/60 | HR 70 | Temp 98.3°F | Wt 180.0 lb

## 2015-09-02 DIAGNOSIS — R0600 Dyspnea, unspecified: Secondary | ICD-10-CM | POA: Diagnosis not present

## 2015-09-02 DIAGNOSIS — R059 Cough, unspecified: Secondary | ICD-10-CM | POA: Insufficient documentation

## 2015-09-02 DIAGNOSIS — I38 Endocarditis, valve unspecified: Secondary | ICD-10-CM

## 2015-09-02 DIAGNOSIS — R05 Cough: Secondary | ICD-10-CM

## 2015-09-02 LAB — BASIC METABOLIC PANEL
BUN: 10 mg/dL (ref 6–23)
CO2: 28 meq/L (ref 19–32)
Calcium: 9.8 mg/dL (ref 8.4–10.5)
Chloride: 103 mEq/L (ref 96–112)
Creatinine, Ser: 0.91 mg/dL (ref 0.40–1.20)
GFR: 76.59 mL/min (ref >60.00)
GLUCOSE: 103 mg/dL — AB (ref 70–99)
POTASSIUM: 3.6 meq/L (ref 3.5–5.1)
SODIUM: 140 meq/L (ref 135–145)

## 2015-09-02 LAB — HEPATIC FUNCTION PANEL
ALT: 12 U/L (ref 0–35)
AST: 21 U/L (ref 0–37)
Albumin: 4.3 g/dL (ref 3.5–5.2)
Alkaline Phosphatase: 79 U/L (ref 39–117)
BILIRUBIN DIRECT: 0.1 mg/dL (ref 0.0–0.3)
BILIRUBIN TOTAL: 0.7 mg/dL (ref 0.2–1.2)
Total Protein: 7.6 g/dL (ref 6.0–8.3)

## 2015-09-02 LAB — BRAIN NATRIURETIC PEPTIDE: Pro B Natriuretic peptide (BNP): 55 pg/mL (ref 0.0–100.0)

## 2015-09-02 MED ORDER — BENZONATATE 200 MG PO CAPS: 200.0000 mg | ORAL_CAPSULE | Freq: Three times a day (TID) | ORAL | Status: DC | PRN

## 2015-09-02 MED ORDER — FLUTICASONE FUROATE-VILANTEROL 100-25 MCG/INH IN AEPB: 1.0000 | INHALATION_SPRAY | Freq: Every day | RESPIRATORY_TRACT | Status: DC

## 2015-09-02 NOTE — Assessment & Plan Note (Signed)
moderate AS on ECHO discussed

## 2015-09-02 NOTE — Progress Notes (Signed)
Subjective:  Patient ID: Terri King, female    DOB: Mar 18, 1934  Age: 81 y.o. MRN: [id number]  CC: No chief complaint on file.   HPI Terri King presents for a cough off and on 3 mo  Outpatient Prescriptions Prior to Visit  Medication Sig Dispense Refill  . ALPRAZolam (XANAX) 0.25 MG tablet Take 0.5-1 tablets (0.125-0.25 mg total) by mouth 2 (two) times daily as needed for anxiety. 60 tablet 3  . amLODipine (NORVASC) 5 MG tablet TAKE 1 TABLET EVERY DAY 90 tablet 3  . cholecalciferol (VITAMIN D) 400 UNITS TABS Take 400 Units by mouth daily.    Marland Kitchen. escitalopram (LEXAPRO) 10 MG tablet Take 1 tablet (10 mg total) by mouth daily. 90 tablet 3  . gabapentin (NEURONTIN) 100 MG capsule Take 1 capsule (100 mg total) by mouth 3 (three) times daily. 270 capsule 3  . ibuprofen (ADVIL,MOTRIN) 600 MG tablet TAKE 1 TABLET EVERY 8 HOURS AS NEEDED  FOR  PAIN (SUBSTITUTED FOR MOTRIN) 90 tablet 1  . Krill Oil (MAXIMUM RED KRILL) 300 MG CAPS 1 po qd 100 capsule 3  . metoprolol succinate (TOPROL-XL) 50 MG 24 hr tablet TAKE 1 TABLET AT BEDTIME 90 tablet 2  . potassium chloride SA (K-DUR,KLOR-CON) 20 MEQ tablet TAKE 1 TABLET EVERY DAY 90 tablet 2  . ranitidine (ZANTAC) 150 MG tablet Take 1 tablet (150 mg total) by mouth 2 (two) times daily. 180 tablet 3  . rosuvastatin (CRESTOR) 5 MG tablet Take 1 tablet (5 mg total) by mouth daily. 90 tablet 3  . temazepam (RESTORIL) 30 MG capsule Take 1 capsule (30 mg total) by mouth at bedtime as needed for sleep. 90 capsule 1  . triamterene-hydrochlorothiazide (MAXZIDE-25) 37.5-25 MG per tablet Take 1 tablet by mouth daily. 90 tablet 3  . vitamin B-12 (CYANOCOBALAMIN) 100 MCG tablet Take 100 mcg by mouth daily.     No facility-administered medications prior to visit.    ROS Review of Systems  Constitutional: Negative for chills, activity change, appetite change, fatigue and unexpected weight change.  HENT: Negative for congestion, mouth sores and sinus pressure.    Eyes: Negative for visual disturbance.  Respiratory: Positive for cough. Negative for chest tightness.   Gastrointestinal: Negative for nausea and abdominal pain.  Genitourinary: Negative for frequency, difficulty urinating and vaginal pain.  Musculoskeletal: Negative for back pain and gait problem.  Skin: Negative for pallor and rash.  Neurological: Negative for dizziness, tremors, weakness, numbness and headaches.  Psychiatric/Behavioral: Negative for confusion and sleep disturbance. The patient is not nervous/anxious.     Objective:  BP 148/60 mmHg  Pulse 70  Temp(Src) 98.3 F (36.8 C) (Oral)  Wt 180 lb (81.647 kg)  SpO2 95%  BP Readings from Last 3 Encounters:  09/02/15 148/60  06/14/15 138/70  05/13/15 130/68    Wt Readings from Last 3 Encounters:  09/02/15 180 lb (81.647 kg)  06/14/15 185 lb (83.915 kg)  05/13/15 179 lb (81.194 kg)    Physical Exam  Constitutional: She appears well-developed. No distress.  HENT:  Head: Normocephalic.  Right Ear: External ear normal.  Left Ear: External ear normal.  Nose: Nose normal.  Mouth/Throat: Oropharynx is clear and moist.  Eyes: Conjunctivae are normal. Pupils are equal, round, and reactive to light. Right eye exhibits no discharge. Left eye exhibits no discharge.  Neck: Normal range of motion. Neck supple. No JVD present. No tracheal deviation present. No thyromegaly present.  Cardiovascular: Normal rate and regular rhythm.  Exam  reveals no friction rub.   Murmur heard. Pulmonary/Chest: No stridor. No respiratory distress. She has no wheezes.  Abdominal: Soft. Bowel sounds are normal. She exhibits no distension and no mass. There is no tenderness. There is no rebound and no guarding.  Musculoskeletal: She exhibits no edema or tenderness.  Lymphadenopathy:    She has no cervical adenopathy.  Neurological: She displays normal reflexes. No cranial nerve deficit. She exhibits normal muscle tone. Coordination normal.  Skin:  No rash noted. No erythema.  Psychiatric: She has a normal mood and affect. Her behavior is normal. Judgment and thought content normal.   2/3 syst heart murmur   I personally provided Breo inhaler use teaching. After the teaching patient was able to demonstrate it's use effectively. All questions were answered   Lab Results  Component Value Date   WBC 8.1 04/29/2015   HGB 13.5 04/29/2015   HCT 39.7 04/29/2015   PLT 235 04/29/2015   GLUCOSE 113* 04/29/2015   CHOL 181 12/23/2014   TRIG 179.0* 12/23/2014   HDL 57.00 12/23/2014   LDLDIRECT 186.1 03/24/2013   LDLCALC 89 12/23/2014   ALT 11 12/23/2014   AST 20 12/23/2014   NA 138 04/29/2015   K 3.2* 04/29/2015   CL 102 04/29/2015   CREATININE 0.80 04/29/2015   BUN 5* 04/29/2015   CO2 24 04/29/2015   TSH 2.42 08/11/2014   INR 1.44 01/25/2012   HGBA1C 5.7 12/23/2014    Ct Head Wo Contrast  05/13/2015  CLINICAL DATA:  Altered mental status. Memory loss after a fall on 04/25/2015. EXAM: CT HEAD WITHOUT CONTRAST TECHNIQUE: Contiguous axial images were obtained from the base of the skull through the vertex without intravenous contrast. COMPARISON:  None. FINDINGS: There is no acute intracranial hemorrhage or intracranial mass lesion. No definitive acute infarction. There is vague periventricular white matter lucency most prominent high in the left parietal lobe consistent with chronic small vessel ischemic disease. Bones are normal. IMPRESSION: No acute abnormality.  Slight chronic small vessel ischemic changes. Electronically Signed   By: Francene Boyers M.D.   On: 05/13/2015 14:14    Assessment & Plan:   There are no diagnoses linked to this encounter. I am having Ms. Whitsell maintain her vitamin B-12, cholecalciferol, MAXIMUM RED KRILL, rosuvastatin, escitalopram, gabapentin, ranitidine, triamterene-hydrochlorothiazide, ALPRAZolam, temazepam, ibuprofen, metoprolol succinate, amLODipine, and potassium chloride SA.  No orders of the  defined types were placed in this encounter.     Follow-up: No Follow-up on file.  Sonda Primes, MD

## 2015-09-02 NOTE — Progress Notes (Signed)
Pre visit review using our clinic review tool, if applicable. No additional management support is needed unless otherwise documented below in the visit note. 

## 2015-09-02 NOTE — Patient Instructions (Signed)
Aortic Stenosis Aortic stenosis is a narrowing of the aortic valve. The aortic valve is a gate-like structure that is located between the lower left chamber of the heart (left ventricle) and the blood vessel that leads away from the heart (aorta). When the aortic valve is narrowed, it does not open all the way. This makes it hard for the heart to pump blood into the aorta and causes the heart to work harder. The extra work can weaken the heart over time and lead to heart failure. CAUSES  Causes of aortic stenosis include:  Calcium deposits on the aortic valve that have made the valve stiff. This condition generally affects those over the age of 65. It is the most common cause of aortic stenosis.  A birth defect.  Rheumatic fever. This is a problem that may occur after a strep throat infection that was not treated adequately. Rheumatic fever can cause permanent damage to heart valves. SIGNS AND SYMPTOMS  People with aortic stenosis usually have no symptoms until the condition becomes severe. It may take 10-20 years for mild or moderate aortic stenosis to become severe. Symptoms may include:   Shortness of breath, especially with physical activity.   Feeling weak and tired (fatigued) or getting tired easily.  Chest discomfort (angina). This may occur with minimal activity if the aortic stenosis is severe.  An irregular or faster-than-normal heartbeat.  Dizziness or fainting that happens with exertion or after taking certain heart medicines (such as nitroglycerin). DIAGNOSIS  Aortic stenosis is usually diagnosed with a physical exam and with a type of imaging test called echocardiography. During echocardiography, sound waves are used to evaluate how blood flows through the heart. If your health care provider suspects aortic stenosis but the test does not clearly show it, a procedure called cardiac catheterization may be done to diagnose the condition. Tests may also be done to evaluate heart  function. They may include:  Electrocardiography. During this test, the electrical impulses of the heart are recorded while you are lying down and sticky patches are placed on your chest, arms, and legs.  Stress tests. There is more than one type of stress test. If a stress test is needed, ask your health care provider about which type is best for you.  Blood tests. TREATMENT  Treatment depends on how severe the aortic stenosis is, your symptoms, and the problems it is causing.   Observation. If the aortic stenosis is mild, no treatment may be needed. However, you will need to have the condition checked regularly to make sure it is not getting worse or causing serious problems.  Surgery. Surgery to repair or replace the aortic valve is the most common treatment for aortic stenosis. Several types of surgeries are available. The most common are open-heart surgery and transcutaneous aortic valve replacement (TAVR). TAVR does not require that the chest be opened. It is usually performed on elderly patients and those who are not able to have open-heart surgery.  Medicines. Medicines may be given to keep symptoms from getting worse. Medicines cannot reverse aortic stenosis. HOME CARE INSTRUCTIONS   You may need to avoid certain types of physical activity. If your aortic stenosis is mild, you may need to avoid only strenuous activity. The more severe your aortic stenosis, the more activities you will need to avoid. Talk with your health care provider about the types of activity you should avoid.  Take medicines only as directed by your health care provider.  If you are a woman with   aortic valve stenosis and want to get pregnant, talk to your health care provider before you become pregnant.  If you are a woman with aortic valve stenosis and are pregnant, keep all follow-up visits with all recommended health care providers.  Keep all follow-up visits for tests, exams, and treatments as directed by  your health care provider. SEEK IMMEDIATE MEDICAL CARE IF:  You develop chest pain or tightness.   You develop shortness of breath or difficulty breathing.   You develop light-headedness or faint.   It feels like your heartbeat is irregular or faster than normal.  You have a fever.   This information is not intended to replace advice given to you by your health care provider. Make sure you discuss any questions you have with your health care provider.   Document Released: 11/04/2002 Document Revised: 10/27/2014 Document Reviewed: 01/31/2012 Elsevier Interactive Patient Education 2016 [employer]  

## 2015-09-02 NOTE — Assessment & Plan Note (Signed)
[facility] Prom-cod syr

## 2015-09-06 ENCOUNTER — Ambulatory Visit: Payer: Commercial Managed Care - HMO | Admitting: Internal Medicine

## 2015-09-13 ENCOUNTER — Ambulatory Visit: Payer: Commercial Managed Care - HMO | Admitting: Internal Medicine

## 2015-09-13 ENCOUNTER — Other Ambulatory Visit: Payer: Self-pay | Admitting: Internal Medicine

## 2015-09-14 DIAGNOSIS — Z1231 Encounter for screening mammogram for malignant neoplasm of breast: Secondary | ICD-10-CM | POA: Diagnosis not present

## 2015-09-14 DIAGNOSIS — Z803 Family history of malignant neoplasm of breast: Secondary | ICD-10-CM | POA: Diagnosis not present

## 2015-09-14 LAB — HM MAMMOGRAPHY

## 2015-09-16 ENCOUNTER — Telehealth: Payer: Self-pay | Admitting: Internal Medicine

## 2015-09-16 DIAGNOSIS — Z13820 Encounter for screening for osteoporosis: Secondary | ICD-10-CM

## 2015-09-22 ENCOUNTER — Encounter: Payer: Self-pay | Admitting: Internal Medicine

## 2015-09-27 NOTE — Telephone Encounter (Signed)
Pls order Thx

## 2015-09-27 NOTE — Telephone Encounter (Signed)
I called pt- she has not had a DEXA scan recently. She is requesting order.

## 2015-09-28 NOTE — Telephone Encounter (Signed)
Order placed

## 2015-10-03 ENCOUNTER — Ambulatory Visit (INDEPENDENT_AMBULATORY_CARE_PROVIDER_SITE_OTHER)
Admission: RE | Admit: 2015-10-03 | Discharge: 2015-10-03 | Disposition: A | Discharge status: Home / Self Care | Payer: Commercial Managed Care - HMO | Source: Ambulatory Visit | Attending: Internal Medicine | Admitting: Internal Medicine

## 2015-10-03 DIAGNOSIS — Z13820 Encounter for screening for osteoporosis: Secondary | ICD-10-CM

## 2015-11-01 ENCOUNTER — Encounter: Payer: Self-pay | Admitting: Internal Medicine

## 2015-11-01 ENCOUNTER — Ambulatory Visit (INDEPENDENT_AMBULATORY_CARE_PROVIDER_SITE_OTHER): Payer: Commercial Managed Care - HMO | Admitting: Internal Medicine

## 2015-11-01 ENCOUNTER — Other Ambulatory Visit (INDEPENDENT_AMBULATORY_CARE_PROVIDER_SITE_OTHER): Payer: Commercial Managed Care - HMO

## 2015-11-01 VITALS — BP 170/92 | HR 86 | Wt 183.0 lb

## 2015-11-01 DIAGNOSIS — R739 Hyperglycemia, unspecified: Secondary | ICD-10-CM

## 2015-11-01 DIAGNOSIS — M810 Age-related osteoporosis without current pathological fracture: Secondary | ICD-10-CM

## 2015-11-01 DIAGNOSIS — F4323 Adjustment disorder with mixed anxiety and depressed mood: Secondary | ICD-10-CM

## 2015-11-01 DIAGNOSIS — Z23 Encounter for immunization: Secondary | ICD-10-CM

## 2015-11-01 DIAGNOSIS — G4700 Insomnia, unspecified: Secondary | ICD-10-CM

## 2015-11-01 LAB — BASIC METABOLIC PANEL
BUN: 11 mg/dL (ref 6–23)
CALCIUM: 9.2 mg/dL (ref 8.4–10.5)
CO2: 30 meq/L (ref 19–32)
Chloride: 105 mEq/L (ref 96–112)
Creatinine, Ser: 1.01 mg/dL (ref 0.40–1.20)
GFR: 67.88 mL/min (ref >60.00)
GLUCOSE: 120 mg/dL — AB (ref 70–99)
POTASSIUM: 3.8 meq/L (ref 3.5–5.1)
SODIUM: 141 meq/L (ref 135–145)

## 2015-11-01 LAB — HEMOGLOBIN A1C: Hgb A1c MFr Bld: 5.9 % (ref 4.6–6.5)

## 2015-11-01 LAB — HEPATIC FUNCTION PANEL
ALBUMIN: 4 g/dL (ref 3.5–5.2)
ALK PHOS: 65 U/L (ref 39–117)
ALT: 11 U/L (ref 0–35)
AST: 20 U/L (ref 0–37)
Bilirubin, Direct: 0.1 mg/dL (ref 0.0–0.3)
TOTAL PROTEIN: 6.9 g/dL (ref 6.0–8.3)
Total Bilirubin: 0.5 mg/dL (ref 0.2–1.2)

## 2015-11-01 LAB — TSH: TSH: 2.1 u[IU]/mL (ref 0.35–4.50)

## 2015-11-01 MED ORDER — DENOSUMAB 60 MG/ML ~~LOC~~ SOLN: 60.0000 mg | SUBCUTANEOUS | 0 refills | Status: DC

## 2015-11-01 NOTE — Assessment & Plan Note (Signed)
Temazepam at hs  Potential benefits of a long term benzodiazepines  use as well as potential risks  and complications were explained to the patient and were aknowledged.

## 2015-11-01 NOTE — Assessment & Plan Note (Signed)
Labs

## 2015-11-01 NOTE — Progress Notes (Signed)
Subjective:  Patient ID: Terri King, female    DOB: 12-Mar-1934  Age: 81 y.o. MRN: [id number]  CC: Follow-up (DEXA results. okay for pneumo vaccine? )   HPI Terri King presents for abn BDS - bone loss, insomnia, HTN f/u. BP nl at home  Outpatient Medications Prior to Visit  Medication Sig Dispense Refill  . ALPRAZolam (XANAX) 0.25 MG tablet Take 0.5-1 tablets (0.125-0.25 mg total) by mouth 2 (two) times daily as needed for anxiety. 60 tablet 3  . amLODipine (NORVASC) 5 MG tablet TAKE 1 TABLET EVERY DAY 90 tablet 3  . benzonatate (TESSALON) 200 MG capsule Take 1 capsule (200 mg total) by mouth 3 (three) times daily as needed for cough. 60 capsule 1  . cholecalciferol (VITAMIN D) 400 UNITS TABS Take 400 Units by mouth daily.    Marland Kitchen escitalopram (LEXAPRO) 10 MG tablet TAKE 1 TABLET EVERY DAY 90 tablet 3  . fluticasone furoate-vilanterol (BREO ELLIPTA) 100-25 MCG/INH AEPB Inhale 1 puff into the lungs daily. 1 each 5  . gabapentin (NEURONTIN) 100 MG capsule TAKE 1 CAPSULE THREE TIMES DAILY 270 capsule 3  . ibuprofen (ADVIL,MOTRIN) 600 MG tablet TAKE 1 TABLET EVERY 8 HOURS AS NEEDED  FOR  PAIN (SUBSTITUTED FOR MOTRIN) 90 tablet 1  . Krill Oil (MAXIMUM RED KRILL) 300 MG CAPS 1 po qd 100 capsule 3  . metoprolol succinate (TOPROL-XL) 50 MG 24 hr tablet TAKE 1 TABLET AT BEDTIME 90 tablet 2  . potassium chloride SA (K-DUR,KLOR-CON) 20 MEQ tablet TAKE 1 TABLET EVERY DAY 90 tablet 2  . ranitidine (ZANTAC) 150 MG tablet TAKE 1 TABLET TWICE DAILY 180 tablet 3  . rosuvastatin (CRESTOR) 5 MG tablet TAKE 1 TABLET EVERY DAY 90 tablet 3  . temazepam (RESTORIL) 30 MG capsule Take 1 capsule (30 mg total) by mouth at bedtime as needed for sleep. 90 capsule 1  . triamterene-hydrochlorothiazide (MAXZIDE-25) 37.5-25 MG tablet TAKE 1 TABLET EVERY DAY 90 tablet 3  . vitamin B-12 (CYANOCOBALAMIN) 100 MCG tablet Take 100 mcg by mouth daily.     No facility-administered medications prior to visit.      ROS Review of Systems  Constitutional: Negative for activity change, appetite change, chills, fatigue and unexpected weight change.  HENT: Negative for congestion, mouth sores and sinus pressure.   Eyes: Negative for visual disturbance.  Respiratory: Negative for cough and chest tightness.   Gastrointestinal: Negative for abdominal pain and nausea.  Genitourinary: Negative for difficulty urinating, frequency and vaginal pain.  Musculoskeletal: Positive for arthralgias, back pain and gait problem.  Skin: Negative for pallor and rash.  Neurological: Negative for dizziness, tremors, weakness, numbness and headaches.  Psychiatric/Behavioral: Positive for sleep disturbance. Negative for confusion. The patient is nervous/anxious.     Objective:  BP (!) 170/92   Pulse 86   Wt 183 lb (83 kg)   SpO2 93%   BMI 31.41 kg/m   BP Readings from Last 3 Encounters:  11/01/15 (!) 170/92  09/02/15 (!) 148/60  06/14/15 138/70    Wt Readings from Last 3 Encounters:  11/01/15 183 lb (83 kg)  09/02/15 180 lb (81.6 kg)  06/14/15 185 lb (83.9 kg)    Physical Exam  Constitutional: She appears well-developed. No distress.  HENT:  Head: Normocephalic.  Right Ear: External ear normal.  Left Ear: External ear normal.  Nose: Nose normal.  Mouth/Throat: Oropharynx is clear and moist.  Eyes: Conjunctivae are normal. Pupils are equal, round, and reactive to light. Right eye  exhibits no discharge. Left eye exhibits no discharge.  Neck: Normal range of motion. Neck supple. No JVD present. No tracheal deviation present. No thyromegaly present.  Cardiovascular: Normal rate, regular rhythm and normal heart sounds.   Pulmonary/Chest: No stridor. No respiratory distress. She has no wheezes.  Abdominal: Soft. Bowel sounds are normal. She exhibits no distension and no mass. There is no tenderness. There is no rebound and no guarding.  Musculoskeletal: She exhibits no edema or tenderness.  Lymphadenopathy:     She has no cervical adenopathy.  Neurological: She displays normal reflexes. No cranial nerve deficit. She exhibits normal muscle tone. Coordination normal.  Skin: No rash noted. No erythema.  Psychiatric: She has a normal mood and affect. Her behavior is normal. Judgment and thought content normal.  Obese  Lab Results  Component Value Date   WBC 8.1 04/29/2015   HGB 13.5 04/29/2015   HCT 39.7 04/29/2015   PLT 235 04/29/2015   GLUCOSE 103 (H) 09/02/2015   CHOL 181 12/23/2014   TRIG 179.0 (H) 12/23/2014   HDL 57.00 12/23/2014   LDLDIRECT 186.1 03/24/2013   LDLCALC 89 12/23/2014   ALT 12 09/02/2015   AST 21 09/02/2015   NA 140 09/02/2015   K 3.6 09/02/2015   CL 103 09/02/2015   CREATININE 0.91 09/02/2015   BUN 10 09/02/2015   CO2 28 09/02/2015   TSH 2.42 08/11/2014   INR 1.44 01/25/2012   HGBA1C 5.7 12/23/2014    Dg Bone Density  Result Date: 10/05/2015 Date of study: 10/03/2015 Exam: DUAL X-RAY ABSORPTIOMETRY (DXA) FOR BONE MINERAL DENSITY (BMD) Instrument: [employer] Therapist, [employer] Requesting Provider: PCP Indication: screening for osteoporosis Comparison: none (please note that it is not possible to compare data from different instruments) Clinical data: Pt is a 81 y.o. female with history of rib fracture. On vitamin D supplements. Results:  Lumbar spine (L2, L4) Femoral neck (FN) Change in BMD from previous DXA test (%) -2.9  RFN:-2.6 LFN:-1.8  (*) statistically significant Assessment: Patient has OSTEOPOROSIS according to the [facility] classification for osteoporosis (see below). Fracture risk: high Comments: the technical quality of the study is good, however, the spine is scoliotic and arthritic. Calcium accumulation in arthritic sites can confound the results of the bone density scan. L1 and L3 vertebrae had to be excluded from analysis. Evaluation for secondary causes should be considered if clinically indicated. Recommend optimizing calcium (1200 mg/day) and vitamin D (800 IU/day).  Followup: Repeat BMD is appropriate after 1-2 years WHO criteria for diagnosis of osteoporosis in postmenopausal women and in men 79 y/o or older: - normal: T-score -1.0 to + 1.0 - osteopenia/low bone density: T-score between -2.5 and -1.0 - osteoporosis: T-score below -2.5 - severe osteoporosis: T-score below -2.5 with history of fragility fracture Note: although not part of the WHO classification, the presence of a fragility fracture, regardless of the T-score, should be considered diagnostic of osteoporosis, provided other causes for the fracture have been excluded. Treatment: The National Osteoporosis Foundation recommends that treatment be considered in postmenopausal women and men age 79 or older with: 1. Hip or vertebral (clinical or morphometric) fracture 2. T-score of - 2.5 or lower at the spine or hip 3. 10-year fracture probability by FRAX of at least 20% for a major osteoporotic fracture and 3% for a hip fracture Carlus Pavlovristina Gherghe, MD [facility]    Assessment & Plan:   Terri RegalCarol was seen today for follow-up.  Diagnoses and all orders for this visit:  Osteoporosis -  Hepatic function panel; Future -     TSH; Future  Hyperglycemia -     Basic metabolic panel; Future -     Hepatic function panel; Future -     TSH; Future -     Hemoglobin A1c; Future  Adjustment disorder with mixed anxiety and depressed mood  Insomnia  Other orders -     denosumab (PROLIA) 60 MG/ML SOLN injection; Inject 60 mg into the skin every 6 (six) months. Administer in upper arm, thigh, or abdomen   I am having Terri King start on denosumab. I am also having her maintain her vitamin B-12, cholecalciferol, MAXIMUM RED KRILL, ALPRAZolam, temazepam, metoprolol succinate, amLODipine, potassium chloride SA, benzonatate, fluticasone furoate-vilanterol, ibuprofen, escitalopram, gabapentin, triamterene-hydrochlorothiazide, rosuvastatin, and ranitidine.  Meds ordered this encounter  Medications  .  denosumab (PROLIA) 60 MG/ML SOLN injection    Sig: Inject 60 mg into the skin every 6 (six) months. Administer in upper arm, thigh, or abdomen    Dispense:  1 mL    Refill:  0     Follow-up: Return in about 3 months (around 01/31/2016) for a follow-up visit.  Sonda Primes, MD

## 2015-11-01 NOTE — Assessment & Plan Note (Addendum)
Prolia Vit D 

## 2015-11-01 NOTE — Assessment & Plan Note (Signed)
Eliminate blackout curtains

## 2015-11-21 ENCOUNTER — Telehealth: Payer: Self-pay | Admitting: Internal Medicine

## 2015-11-21 NOTE — Telephone Encounter (Signed)
Patient states that Dr. Macario GoldsPlot wanted her to get a prolia injection.  Patient states that she received a letter from her insurance company that they would cover this injection.  Can you please follow up with authorization on our end?

## 2015-11-22 ENCOUNTER — Telehealth: Payer: Self-pay | Admitting: Internal Medicine

## 2015-11-22 NOTE — Telephone Encounter (Signed)
I have verified with dr Posey Reaplotnikov that he wants to start prolia---med was ordered during office visit (on no print)---plotnikov wants to give med here in our office---I will be submitting for insurance verification and calling patient with summarty of benefits/copay needed

## 2015-11-22 NOTE — Telephone Encounter (Signed)
Patient in about her bone density. Can you please follow up with her. Thank you!

## 2015-11-22 NOTE — Telephone Encounter (Signed)
error 

## 2015-11-23 NOTE — Telephone Encounter (Signed)
Patient called back in.  I gave her MD response on DEXA.  Delaney Meigsamara is checking insurance to see if Jake Seatsrolia is covered to get that started.  Please follow up with patient if she needs to take anything else.

## 2015-11-23 NOTE — Telephone Encounter (Signed)
Patient states she has a letter that also was sent to plot stating her insurance will cover prolia.

## 2015-11-25 ENCOUNTER — Telehealth: Payer: Self-pay

## 2015-11-25 NOTE — Telephone Encounter (Signed)
Patients insurance has been verified for prolia injections---PA was required and has been completed, pa#[id number]----patient has estimated copay of $265 for injection, ok to schedule at patient's earliest convenience---can talk with Ilo Beamon if any questions

## 2015-12-07 ENCOUNTER — Ambulatory Visit (INDEPENDENT_AMBULATORY_CARE_PROVIDER_SITE_OTHER): Payer: Commercial Managed Care - HMO | Admitting: *Deleted

## 2015-12-07 DIAGNOSIS — M810 Age-related osteoporosis without current pathological fracture: Secondary | ICD-10-CM | POA: Diagnosis not present

## 2015-12-07 MED ORDER — DENOSUMAB 60 MG/ML ~~LOC~~ SOLN
60.0000 mg | Freq: Once | SUBCUTANEOUS | Status: AC
  Administered 2015-12-07: 60 mg via SUBCUTANEOUS

## 2015-12-27 ENCOUNTER — Other Ambulatory Visit: Payer: Self-pay | Admitting: Internal Medicine

## 2016-01-31 ENCOUNTER — Encounter: Payer: Self-pay | Admitting: Internal Medicine

## 2016-01-31 ENCOUNTER — Ambulatory Visit (INDEPENDENT_AMBULATORY_CARE_PROVIDER_SITE_OTHER): Payer: Commercial Managed Care - HMO | Admitting: Internal Medicine

## 2016-01-31 DIAGNOSIS — I10 Essential (primary) hypertension: Secondary | ICD-10-CM

## 2016-01-31 DIAGNOSIS — I2510 Atherosclerotic heart disease of native coronary artery without angina pectoris: Secondary | ICD-10-CM

## 2016-01-31 DIAGNOSIS — K219 Gastro-esophageal reflux disease without esophagitis: Secondary | ICD-10-CM | POA: Diagnosis not present

## 2016-01-31 DIAGNOSIS — F4323 Adjustment disorder with mixed anxiety and depressed mood: Secondary | ICD-10-CM | POA: Diagnosis not present

## 2016-01-31 DIAGNOSIS — F5101 Primary insomnia: Secondary | ICD-10-CM

## 2016-01-31 NOTE — Assessment & Plan Note (Signed)
Crestor,  ASA 

## 2016-01-31 NOTE — Progress Notes (Signed)
Pre visit review using our clinic review tool, if applicable. No additional management support is needed unless otherwise documented below in the visit note. 

## 2016-01-31 NOTE — Assessment & Plan Note (Signed)
On Zantac 

## 2016-01-31 NOTE — Assessment & Plan Note (Signed)
On Norvasc Labs

## 2016-01-31 NOTE — Progress Notes (Signed)
Subjective:  Patient ID: Terri King, female    DOB: 1934-09-13  Age: 81 y.o. MRN: [id number]  CC: No chief complaint on file.   HPI Terri GoldenCarol L Rogerson presents for HTN, OA, anxiety, B12 deficiency f/u  Outpatient Medications Prior to Visit  Medication Sig Dispense Refill  . ALPRAZolam (XANAX) 0.25 MG tablet Take 0.5-1 tablets (0.125-0.25 mg total) by mouth 2 (two) times daily as needed for anxiety. 60 tablet 3  . amLODipine (NORVASC) 5 MG tablet TAKE 1 TABLET EVERY DAY 90 tablet 3  . benzonatate (TESSALON) 200 MG capsule Take 1 capsule (200 mg total) by mouth 3 (three) times daily as needed for cough. 60 capsule 1  . cholecalciferol (VITAMIN D) 400 UNITS TABS Take 400 Units by mouth daily.    Marland Kitchen. denosumab (PROLIA) 60 MG/ML SOLN injection Inject 60 mg into the skin every 6 (six) months. Administer in upper arm, thigh, or abdomen 1 mL 0  . escitalopram (LEXAPRO) 10 MG tablet TAKE 1 TABLET EVERY DAY 90 tablet 3  . fluticasone furoate-vilanterol (BREO ELLIPTA) 100-25 MCG/INH AEPB Inhale 1 puff into the lungs daily. 1 each 5  . gabapentin (NEURONTIN) 100 MG capsule TAKE 1 CAPSULE THREE TIMES DAILY 270 capsule 3  . ibuprofen (ADVIL,MOTRIN) 600 MG tablet TAKE 1 TABLET EVERY 8 HOURS AS NEEDED  FOR  PAIN (SUBSTITUTED FOR MOTRIN) 90 tablet 1  . Krill Oil (MAXIMUM RED KRILL) 300 MG CAPS 1 po qd 100 capsule 3  . metoprolol succinate (TOPROL-XL) 50 MG 24 hr tablet TAKE 1 TABLET AT BEDTIME 90 tablet 2  . potassium chloride SA (K-DUR,KLOR-CON) 20 MEQ tablet TAKE 1 TABLET EVERY DAY 90 tablet 2  . ranitidine (ZANTAC) 150 MG tablet TAKE 1 TABLET TWICE DAILY 180 tablet 3  . rosuvastatin (CRESTOR) 5 MG tablet TAKE 1 TABLET EVERY DAY 90 tablet 3  . temazepam (RESTORIL) 30 MG capsule Take 1 capsule (30 mg total) by mouth at bedtime as needed for sleep. 90 capsule 1  . triamterene-hydrochlorothiazide (MAXZIDE-25) 37.5-25 MG tablet TAKE 1 TABLET EVERY DAY 90 tablet 3  . vitamin B-12 (CYANOCOBALAMIN) 100 MCG  tablet Take 100 mcg by mouth daily.     No facility-administered medications prior to visit.     ROS Review of Systems  Constitutional: Negative for activity change, appetite change, chills, fatigue and unexpected weight change.  HENT: Negative for congestion, mouth sores and sinus pressure.   Eyes: Negative for visual disturbance.  Respiratory: Negative for cough and chest tightness.   Gastrointestinal: Negative for abdominal pain and nausea.  Genitourinary: Negative for difficulty urinating, frequency and vaginal pain.  Musculoskeletal: Positive for arthralgias and back pain. Negative for gait problem.  Skin: Negative for pallor and rash.  Neurological: Negative for dizziness, tremors, weakness, numbness and headaches.  Psychiatric/Behavioral: Negative for confusion and sleep disturbance.    Objective:  BP (!) 124/54   Pulse 67   Wt 187 lb (84.8 kg)   SpO2 94%   BMI 32.10 kg/m   BP Readings from Last 3 Encounters:  01/31/16 (!) 124/54  11/01/15 (!) 170/92  09/02/15 (!) 148/60    Wt Readings from Last 3 Encounters:  01/31/16 187 lb (84.8 kg)  11/01/15 183 lb (83 kg)  09/02/15 180 lb (81.6 kg)    Physical Exam  Constitutional: She appears well-developed. No distress.  HENT:  Head: Normocephalic.  Right Ear: External ear normal.  Left Ear: External ear normal.  Nose: Nose normal.  Mouth/Throat: Oropharynx is clear and  moist.  Eyes: Conjunctivae are normal. Pupils are equal, round, and reactive to light. Right eye exhibits no discharge. Left eye exhibits no discharge.  Neck: Normal range of motion. Neck supple. No JVD present. No tracheal deviation present. No thyromegaly present.  Cardiovascular: Normal rate, regular rhythm and normal heart sounds.   Pulmonary/Chest: No stridor. No respiratory distress. She has no wheezes.  Abdominal: Soft. Bowel sounds are normal. She exhibits no distension and no mass. There is no tenderness. There is no rebound and no guarding.    Musculoskeletal: She exhibits tenderness. She exhibits no edema.  Lymphadenopathy:    She has no cervical adenopathy.  Neurological: She displays normal reflexes. No cranial nerve deficit. She exhibits normal muscle tone. Coordination abnormal.  Skin: No rash noted. No erythema.  Psychiatric: She has a normal mood and affect. Her behavior is normal. Judgment and thought content normal.    Lab Results  Component Value Date   WBC 8.1 04/29/2015   HGB 13.5 04/29/2015   HCT 39.7 04/29/2015   PLT 235 04/29/2015   GLUCOSE 120 (H) 11/01/2015   CHOL 181 12/23/2014   TRIG 179.0 (H) 12/23/2014   HDL 57.00 12/23/2014   LDLDIRECT 186.1 03/24/2013   LDLCALC 89 12/23/2014   ALT 11 11/01/2015   AST 20 11/01/2015   NA 141 11/01/2015   K 3.8 11/01/2015   CL 105 11/01/2015   CREATININE 1.01 11/01/2015   BUN 11 11/01/2015   CO2 30 11/01/2015   TSH 2.10 11/01/2015   INR 1.44 01/25/2012   HGBA1C 5.9 11/01/2015    Dg Bone Density  Result Date: 10/05/2015 Date of study: 10/03/2015 Exam: DUAL X-RAY ABSORPTIOMETRY (DXA) FOR BONE MINERAL DENSITY (BMD) Instrument: [employer] Therapist, [employer] Requesting Provider: PCP Indication: screening for osteoporosis Comparison: none (please note that it is not possible to compare data from different instruments) Clinical data: Pt is a 81 y.o. female with history of rib fracture. On vitamin D supplements. Results:  Lumbar spine (L2, L4) Femoral neck (FN) Change in BMD from previous DXA test (%) -2.9  RFN:-2.6 LFN:-1.8  (*) statistically significant Assessment: Patient has OSTEOPOROSIS according to the [facility] classification for osteoporosis (see below). Fracture risk: high Comments: the technical quality of the study is good, however, the spine is scoliotic and arthritic. Calcium accumulation in arthritic sites can confound the results of the bone density scan. L1 and L3 vertebrae had to be excluded from analysis. Evaluation for secondary causes should be considered if clinically  indicated. Recommend optimizing calcium (1200 mg/day) and vitamin D (800 IU/day). Followup: Repeat BMD is appropriate after 1-2 years WHO criteria for diagnosis of osteoporosis in postmenopausal women and in men 79 y/o or older: - normal: T-score -1.0 to + 1.0 - osteopenia/low bone density: T-score between -2.5 and -1.0 - osteoporosis: T-score below -2.5 - severe osteoporosis: T-score below -2.5 with history of fragility fracture Note: although not part of the WHO classification, the presence of a fragility fracture, regardless of the T-score, should be considered diagnostic of osteoporosis, provided other causes for the fracture have been excluded. Treatment: The National Osteoporosis Foundation recommends that treatment be considered in postmenopausal women and men age 79 or older with: 1. Hip or vertebral (clinical or morphometric) fracture 2. T-score of - 2.5 or lower at the spine or hip 3. 10-year fracture probability by FRAX of at least 20% for a major osteoporotic fracture and 3% for a hip fracture Carlus Pavlovristina Gherghe, MD [facility]    Assessment & Plan:  There are no diagnoses linked to this encounter. I am having Ms. Buchinger maintain her vitamin B-12, cholecalciferol, MAXIMUM RED KRILL, ALPRAZolam, temazepam, amLODipine, benzonatate, fluticasone furoate-vilanterol, ibuprofen, escitalopram, gabapentin, triamterene-hydrochlorothiazide, rosuvastatin, ranitidine, denosumab, potassium chloride SA, metoprolol succinate, Multiple Vitamins-Minerals (MULTIPLE VITAMINS/WOMENS PO), Multiple Vitamins-Minerals (EYE VITAMINS PO), and Vitamin E.  Meds ordered this encounter  Medications  . Multiple Vitamins-Minerals (MULTIPLE VITAMINS/WOMENS PO)    Sig: Take by mouth as directed.  . Multiple Vitamins-Minerals (EYE VITAMINS PO)    Sig: Take by mouth as directed.  . Vitamin E 400 units TABS    Sig: Take by mouth as directed.     Follow-up: No Follow-up on file.  Sonda Primes, MD

## 2016-01-31 NOTE — Assessment & Plan Note (Signed)
Temazepam prn - not w/Xanax  Potential benefits of a long term benzodiazepines  use as well as potential risks  and complications were explained to the patient and were aknowledged.

## 2016-01-31 NOTE — Assessment & Plan Note (Signed)
Lexapro Alprazolam prn  Potential benefits of a long term benzodiazepines  use as well as potential risks  and complications were explained to the patient and were aknowledged. 

## 2016-02-21 ENCOUNTER — Other Ambulatory Visit: Payer: Self-pay | Admitting: Internal Medicine

## 2016-02-24 NOTE — Telephone Encounter (Signed)
Called refill into [facility] spoke w/Molly pharmacist gave md approval.../lmb

## 2016-05-07 ENCOUNTER — Other Ambulatory Visit: Payer: Self-pay | Admitting: Internal Medicine

## 2016-05-09 ENCOUNTER — Telehealth: Payer: Self-pay | Admitting: Internal Medicine

## 2016-05-09 NOTE — Telephone Encounter (Signed)
Patient is calling about her prolia injection. She got a letter in the mail saying it was time to set it up. Please advise if it is ok to make this appointment. Thank you.

## 2016-05-10 ENCOUNTER — Other Ambulatory Visit (INDEPENDENT_AMBULATORY_CARE_PROVIDER_SITE_OTHER): Payer: PRIVATE HEALTH INSURANCE

## 2016-05-10 ENCOUNTER — Other Ambulatory Visit: Payer: Self-pay | Admitting: Internal Medicine

## 2016-05-10 ENCOUNTER — Ambulatory Visit (INDEPENDENT_AMBULATORY_CARE_PROVIDER_SITE_OTHER): Payer: PRIVATE HEALTH INSURANCE | Admitting: Internal Medicine

## 2016-05-10 ENCOUNTER — Encounter: Payer: Self-pay | Admitting: Internal Medicine

## 2016-05-10 VITALS — BP 122/68 | HR 66 | Temp 98.3°F | Resp 16 | Ht 64.0 in | Wt 185.5 lb

## 2016-05-10 DIAGNOSIS — R739 Hyperglycemia, unspecified: Secondary | ICD-10-CM | POA: Diagnosis not present

## 2016-05-10 DIAGNOSIS — M150 Primary generalized (osteo)arthritis: Secondary | ICD-10-CM | POA: Diagnosis not present

## 2016-05-10 DIAGNOSIS — F5101 Primary insomnia: Secondary | ICD-10-CM

## 2016-05-10 DIAGNOSIS — I10 Essential (primary) hypertension: Secondary | ICD-10-CM | POA: Diagnosis not present

## 2016-05-10 DIAGNOSIS — M159 Polyosteoarthritis, unspecified: Secondary | ICD-10-CM

## 2016-05-10 LAB — BASIC METABOLIC PANEL
BUN: 12 mg/dL (ref 6–23)
CHLORIDE: 105 meq/L (ref 96–112)
CO2: 28 mEq/L (ref 19–32)
CREATININE: 0.95 mg/dL (ref 0.40–1.20)
Calcium: 10.1 mg/dL (ref 8.4–10.5)
GFR: 72.76 mL/min (ref >60.00)
Glucose, Bld: 113 mg/dL — ABNORMAL HIGH (ref 70–99)
Potassium: 3.4 mEq/L — ABNORMAL LOW (ref 3.5–5.1)
Sodium: 139 mEq/L (ref 135–145)

## 2016-05-10 LAB — HEMOGLOBIN A1C: HEMOGLOBIN A1C: 6.1 % (ref 4.6–6.5)

## 2016-05-10 MED ORDER — IBUPROFEN 600 MG PO TABS: 600.0000 mg | ORAL_TABLET | Freq: Every day | ORAL | 0 refills | Status: DC | PRN

## 2016-05-10 MED ORDER — POTASSIUM CHLORIDE ER 8 MEQ PO TBCR: 8.0000 meq | EXTENDED_RELEASE_TABLET | Freq: Every day | ORAL | 11 refills | Status: DC

## 2016-05-10 NOTE — Assessment & Plan Note (Signed)
Temazepam prn 

## 2016-05-10 NOTE — Progress Notes (Signed)
Pre-visit discussion using our clinic review tool. No additional management support is needed unless otherwise documented below in the visit note.  

## 2016-05-10 NOTE — Progress Notes (Signed)
Subjective:  Patient ID: Terri King, female    DOB: 1934/12/01  Age: 82 y.o. MRN: [id number]  CC: Follow-up (HTN, GERD, OA, HYPERLIPIDEMIA, insomnia, rib pain left side )   HPI MADELL HEINO presents for HTN, GERD, anxiety, OA f/u  Outpatient Medications Prior to Visit  Medication Sig Dispense Refill  . ALPRAZolam (XANAX) 0.25 MG tablet Take 0.5-1 tablets (0.125-0.25 mg total) by mouth 2 (two) times daily as needed for anxiety. 60 tablet 3  . amLODipine (NORVASC) 5 MG tablet TAKE 1 TABLET EVERY DAY 90 tablet 2  . benzonatate (TESSALON) 200 MG capsule Take 1 capsule (200 mg total) by mouth 3 (three) times daily as needed for cough. 60 capsule 1  . cholecalciferol (VITAMIN D) 400 UNITS TABS Take 400 Units by mouth daily.    Marland Kitchen denosumab (PROLIA) 60 MG/ML SOLN injection Inject 60 mg into the skin every 6 (six) months. Administer in upper arm, thigh, or abdomen 1 mL 0  . escitalopram (LEXAPRO) 10 MG tablet TAKE 1 TABLET EVERY DAY 90 tablet 3  . fluticasone furoate-vilanterol (BREO ELLIPTA) 100-25 MCG/INH AEPB Inhale 1 puff into the lungs daily. 1 each 5  . gabapentin (NEURONTIN) 100 MG capsule TAKE 1 CAPSULE THREE TIMES DAILY 270 capsule 3  . ibuprofen (ADVIL,MOTRIN) 600 MG tablet TAKE 1 TABLET EVERY 8 HOURS AS NEEDED  FOR  PAIN (SUBSTITUTED FOR MOTRIN) 90 tablet 1  . Krill Oil (MAXIMUM RED KRILL) 300 MG CAPS 1 po qd 100 capsule 3  . metoprolol succinate (TOPROL-XL) 50 MG 24 hr tablet TAKE 1 TABLET AT BEDTIME 90 tablet 2  . Multiple Vitamins-Minerals (EYE VITAMINS PO) Take by mouth as directed.    . Multiple Vitamins-Minerals (MULTIPLE VITAMINS/WOMENS PO) Take by mouth as directed.    . potassium chloride SA (K-DUR,KLOR-CON) 20 MEQ tablet TAKE 1 TABLET EVERY DAY 90 tablet 2  . ranitidine (ZANTAC) 150 MG tablet TAKE 1 TABLET TWICE DAILY 180 tablet 3  . rosuvastatin (CRESTOR) 5 MG tablet TAKE 1 TABLET EVERY DAY 90 tablet 3  . temazepam (RESTORIL) 30 MG capsule TAKE 1 CAPSULE AT BEDTIME  AS NEEDED FOR SLEEP 90 capsule 1  . triamterene-hydrochlorothiazide (MAXZIDE-25) 37.5-25 MG tablet TAKE 1 TABLET EVERY DAY 90 tablet 3  . vitamin B-12 (CYANOCOBALAMIN) 100 MCG tablet Take 100 mcg by mouth daily.    . Vitamin E 400 units TABS Take by mouth as directed.     No facility-administered medications prior to visit.     ROS Review of Systems  Constitutional: Negative for activity change, appetite change, chills, fatigue and unexpected weight change.  HENT: Negative for congestion, mouth sores and sinus pressure.   Eyes: Negative for visual disturbance.  Respiratory: Negative for cough and chest tightness.   Gastrointestinal: Negative for abdominal pain and nausea.  Genitourinary: Negative for difficulty urinating, frequency and vaginal pain.  Musculoskeletal: Positive for arthralgias and back pain. Negative for gait problem.  Skin: Negative for pallor and rash.  Neurological: Negative for dizziness, tremors, weakness, numbness and headaches.  Psychiatric/Behavioral: Positive for sleep disturbance. Negative for confusion. The patient is nervous/anxious.     Objective:  BP 122/68   Pulse 66   Temp 98.3 F (36.8 C) (Oral)   Resp 16   Ht 5\' 4"  (1.626 m)   Wt 185 lb 8 oz (84.1 kg)   SpO2 97%   BMI 31.84 kg/m   BP Readings from Last 3 Encounters:  05/10/16 122/68  01/31/16 (!) 124/54  11/01/15 Marland Kitchen)  170/92    Wt Readings from Last 3 Encounters:  05/10/16 185 lb 8 oz (84.1 kg)  01/31/16 187 lb (84.8 kg)  11/01/15 183 lb (83 kg)    Physical Exam  Constitutional: She appears well-developed. No distress.  HENT:  Head: Normocephalic.  Right Ear: External ear normal.  Left Ear: External ear normal.  Nose: Nose normal.  Mouth/Throat: Oropharynx is clear and moist.  Eyes: Conjunctivae are normal. Pupils are equal, round, and reactive to light. Right eye exhibits no discharge. Left eye exhibits no discharge.  Neck: Normal range of motion. Neck supple. No JVD present. No  tracheal deviation present. No thyromegaly present.  Cardiovascular: Normal rate, regular rhythm and normal heart sounds.   Pulmonary/Chest: No stridor. No respiratory distress. She has no wheezes.  Abdominal: Soft. Bowel sounds are normal. She exhibits no distension and no mass. There is no tenderness. There is no rebound and no guarding.  Musculoskeletal: She exhibits tenderness. She exhibits no edema.  Lymphadenopathy:    She has no cervical adenopathy.  Neurological: She displays normal reflexes. No cranial nerve deficit. She exhibits normal muscle tone. Coordination abnormal.  Skin: No rash noted. No erythema.  Psychiatric: She has a normal mood and affect. Her behavior is normal. Judgment and thought content normal.  L shoulder, B knees sensitive  Lab Results  Component Value Date   WBC 8.1 04/29/2015   HGB 13.5 04/29/2015   HCT 39.7 04/29/2015   PLT 235 04/29/2015   GLUCOSE 120 (H) 11/01/2015   CHOL 181 12/23/2014   TRIG 179.0 (H) 12/23/2014   HDL 57.00 12/23/2014   LDLDIRECT 186.1 03/24/2013   LDLCALC 89 12/23/2014   ALT 11 11/01/2015   AST 20 11/01/2015   NA 141 11/01/2015   K 3.8 11/01/2015   CL 105 11/01/2015   CREATININE 1.01 11/01/2015   BUN 11 11/01/2015   CO2 30 11/01/2015   TSH 2.10 11/01/2015   INR 1.44 01/25/2012   HGBA1C 5.9 11/01/2015    Dg Bone Density  Result Date: 10/05/2015 Date of study: 10/03/2015 Exam: DUAL X-RAY ABSORPTIOMETRY (DXA) FOR BONE MINERAL DENSITY (BMD) Instrument: [employer] Therapist, [employer] Requesting Provider: PCP Indication: screening for osteoporosis Comparison: none (please note that it is not possible to compare data from different instruments) Clinical data: Pt is a 82 y.o. female with history of rib fracture. On vitamin D supplements. Results:  Lumbar spine (L2, L4) Femoral neck (FN) Change in BMD from previous DXA test (%) -2.9  RFN:-2.6 LFN:-1.8  (*) statistically significant Assessment: Patient has OSTEOPOROSIS according to the [facility]  classification for osteoporosis (see below). Fracture risk: high Comments: the technical quality of the study is good, however, the spine is scoliotic and arthritic. Calcium accumulation in arthritic sites can confound the results of the bone density scan. L1 and L3 vertebrae had to be excluded from analysis. Evaluation for secondary causes should be considered if clinically indicated. Recommend optimizing calcium (1200 mg/day) and vitamin D (800 IU/day). Followup: Repeat BMD is appropriate after 1-2 years WHO criteria for diagnosis of osteoporosis in postmenopausal women and in men 80 y/o or older: - normal: T-score -1.0 to + 1.0 - osteopenia/low bone density: T-score between -2.5 and -1.0 - osteoporosis: T-score below -2.5 - severe osteoporosis: T-score below -2.5 with history of fragility fracture Note: although not part of the WHO classification, the presence of a fragility fracture, regardless of the T-score, should be considered diagnostic of osteoporosis, provided other causes for the fracture have been excluded. Treatment: The  National Osteoporosis Foundation recommends that treatment be considered in postmenopausal women and men age 60 or older with: 1. Hip or vertebral (clinical or morphometric) fracture 2. T-score of - 2.5 or lower at the spine or hip 3. 10-year fracture probability by FRAX of at least 20% for a major osteoporotic fracture and 3% for a hip fracture Carlus Pavlov, MD [facility]    Assessment & Plan:   There are no diagnoses linked to this encounter. I am having Ms. Currie maintain her vitamin B-12, cholecalciferol, MAXIMUM RED KRILL, ALPRAZolam, benzonatate, fluticasone furoate-vilanterol, ibuprofen, escitalopram, gabapentin, triamterene-hydrochlorothiazide, rosuvastatin, ranitidine, denosumab, potassium chloride SA, metoprolol succinate, Multiple Vitamins-Minerals (MULTIPLE VITAMINS/WOMENS PO), Multiple Vitamins-Minerals (EYE VITAMINS PO), Vitamin E, temazepam, and  amLODipine.  No orders of the defined types were placed in this encounter.    Follow-up: No Follow-up on file.  Sonda Primes, MD

## 2016-05-10 NOTE — Assessment & Plan Note (Signed)
Ibuprofen 600 mg po qd prn pc

## 2016-05-10 NOTE — Assessment & Plan Note (Signed)
Labs

## 2016-05-10 NOTE — Telephone Encounter (Signed)
Patient's last injection was 10/18---she can't get next injection before April 18th---I have advised patient I will reverify her insurance closer to that time and call her back to schedule next injection

## 2016-05-10 NOTE — Assessment & Plan Note (Signed)
Norvasc ASA

## 2016-05-11 ENCOUNTER — Telehealth: Payer: Self-pay | Admitting: Internal Medicine

## 2016-05-11 NOTE — Telephone Encounter (Signed)
Patient called back.  Gave MD response on labs.

## 2016-06-04 ENCOUNTER — Telehealth: Payer: Self-pay | Admitting: Internal Medicine

## 2016-06-04 NOTE — Telephone Encounter (Signed)
Please call back after verified with insurance for second prolia injection.

## 2016-06-13 NOTE — Telephone Encounter (Signed)
Pt called back regarding injection

## 2016-07-02 NOTE — Telephone Encounter (Signed)
No notes regarding insurance coverage for prolia. Last note regarding same stated she was due no earlier than 06/06/16.

## 2016-07-04 NOTE — Telephone Encounter (Signed)
Summary of benefits showing that patient needs PA---however, I have a PA from oct/2017---I am checking with margaret to see if that PA is still valid, if not, we will start new PA process--I will advise patient after talking with margaret

## 2016-07-10 ENCOUNTER — Other Ambulatory Visit: Payer: Self-pay | Admitting: Internal Medicine

## 2016-07-17 ENCOUNTER — Other Ambulatory Visit: Payer: Self-pay | Admitting: Internal Medicine

## 2016-08-14 ENCOUNTER — Ambulatory Visit (INDEPENDENT_AMBULATORY_CARE_PROVIDER_SITE_OTHER): Payer: Medicare HMO | Admitting: Internal Medicine

## 2016-08-14 ENCOUNTER — Encounter: Payer: Self-pay | Admitting: Internal Medicine

## 2016-08-14 VITALS — BP 136/72 | HR 69 | Temp 98.8°F | Ht 64.0 in | Wt 186.0 lb

## 2016-08-14 DIAGNOSIS — G8929 Other chronic pain: Secondary | ICD-10-CM

## 2016-08-14 DIAGNOSIS — I350 Nonrheumatic aortic (valve) stenosis: Secondary | ICD-10-CM | POA: Insufficient documentation

## 2016-08-14 DIAGNOSIS — M25512 Pain in left shoulder: Secondary | ICD-10-CM | POA: Diagnosis not present

## 2016-08-14 DIAGNOSIS — R0602 Shortness of breath: Secondary | ICD-10-CM | POA: Diagnosis not present

## 2016-08-14 DIAGNOSIS — I38 Endocarditis, valve unspecified: Secondary | ICD-10-CM | POA: Diagnosis not present

## 2016-08-14 NOTE — Assessment & Plan Note (Signed)
SOB: f/u w/Dr Tenny Crawoss for moderate AS, CAD

## 2016-08-14 NOTE — Progress Notes (Signed)
Subjective:  Patient ID: Terri King, female    DOB: 12-29-1934  Age: 82 y.o. MRN: [id number]  CC: No chief complaint on file.   HPI Terri King presents for palpitations and SOB off and on - last SOB in May 2018 C/o pain in the L shoulder - she is L handed. ?fell in Nov 2017... F/u mod AS     Outpatient Medications Prior to Visit  Medication Sig Dispense Refill  . ALPRAZolam (XANAX) 0.25 MG tablet Take 0.5-1 tablets (0.125-0.25 mg total) by mouth 2 (two) times daily as needed for anxiety. 60 tablet 3  . amLODipine (NORVASC) 5 MG tablet TAKE 1 TABLET EVERY DAY 90 tablet 2  . benzonatate (TESSALON) 200 MG capsule Take 1 capsule (200 mg total) by mouth 3 (three) times daily as needed for cough. 60 capsule 1  . cholecalciferol (VITAMIN D) 400 UNITS TABS Take 400 Units by mouth daily.    Marland Kitchen denosumab (PROLIA) 60 MG/ML SOLN injection Inject 60 mg into the skin every 6 (six) months. Administer in upper arm, thigh, or abdomen 1 mL 0  . escitalopram (LEXAPRO) 10 MG tablet TAKE 1 TABLET EVERY DAY 90 tablet 1  . fluticasone furoate-vilanterol (BREO ELLIPTA) 100-25 MCG/INH AEPB Inhale 1 puff into the lungs daily. 1 each 5  . gabapentin (NEURONTIN) 100 MG capsule TAKE 1 CAPSULE THREE TIMES DAILY 270 capsule 3  . ibuprofen (ADVIL,MOTRIN) 600 MG tablet TAKE 1 TABLET (600 MG TOTAL)  DAILY AS NEEDED FOR MODERATE PAIN 90 tablet 0  . Krill Oil (MAXIMUM RED KRILL) 300 MG CAPS 1 po qd 100 capsule 3  . metoprolol succinate (TOPROL-XL) 50 MG 24 hr tablet TAKE 1 TABLET AT BEDTIME 90 tablet 2  . Multiple Vitamins-Minerals (EYE VITAMINS PO) Take by mouth as directed.    . Multiple Vitamins-Minerals (MULTIPLE VITAMINS/WOMENS PO) Take by mouth as directed.    . potassium chloride (KLOR-CON) 8 MEQ tablet Take 1 tablet (8 mEq total) by mouth daily. 30 tablet 11  . potassium chloride SA (K-DUR,KLOR-CON) 20 MEQ tablet TAKE 1 TABLET EVERY DAY 90 tablet 2  . ranitidine (ZANTAC) 150 MG tablet TAKE 1 TABLET  TWICE DAILY 180 tablet 1  . rosuvastatin (CRESTOR) 5 MG tablet TAKE 1 TABLET EVERY DAY 90 tablet 1  . temazepam (RESTORIL) 30 MG capsule TAKE 1 CAPSULE AT BEDTIME AS NEEDED FOR SLEEP 90 capsule 1  . triamterene-hydrochlorothiazide (MAXZIDE-25) 37.5-25 MG tablet TAKE 1 TABLET EVERY DAY 90 tablet 1  . vitamin B-12 (CYANOCOBALAMIN) 100 MCG tablet Take 100 mcg by mouth daily.    . Vitamin E 400 units TABS Take by mouth as directed.     No facility-administered medications prior to visit.     ROS Review of Systems  Constitutional: Positive for fatigue. Negative for activity change, appetite change, chills and unexpected weight change.  HENT: Negative for congestion, mouth sores and sinus pressure.   Eyes: Negative for visual disturbance.  Respiratory: Positive for shortness of breath. Negative for cough and chest tightness.   Gastrointestinal: Negative for abdominal pain and nausea.  Genitourinary: Negative for difficulty urinating, frequency and vaginal pain.  Musculoskeletal: Positive for arthralgias and gait problem. Negative for back pain.  Skin: Negative for pallor and rash.  Neurological: Negative for dizziness, tremors, weakness, numbness and headaches.  Psychiatric/Behavioral: Negative for confusion and sleep disturbance.    Objective:  BP 136/72 (BP Location: Left Arm, Patient Position: Sitting, Cuff Size: Large)   Pulse 69   Temp 98.8  F (37.1 C) (Oral)   Ht 5\' 4"  (1.626 m)   Wt 186 lb (84.4 kg)   SpO2 98%   BMI 31.93 kg/m   BP Readings from Last 3 Encounters:  08/14/16 136/72  05/10/16 122/68  01/31/16 (!) 124/54    Wt Readings from Last 3 Encounters:  08/14/16 186 lb (84.4 kg)  05/10/16 185 lb 8 oz (84.1 kg)  01/31/16 187 lb (84.8 kg)    Physical Exam  Constitutional: She appears well-developed. No distress.  HENT:  Head: Normocephalic.  Right Ear: External ear normal.  Left Ear: External ear normal.  Nose: Nose normal.  Mouth/Throat: Oropharynx is clear  and moist.  Eyes: Conjunctivae are normal. Pupils are equal, round, and reactive to light. Right eye exhibits no discharge. Left eye exhibits no discharge.  Neck: Normal range of motion. Neck supple. No JVD present. No tracheal deviation present. No thyromegaly present.  Cardiovascular: Normal rate and regular rhythm.   Murmur heard. Pulmonary/Chest: No stridor. No respiratory distress. She has no wheezes.  Abdominal: Soft. Bowel sounds are normal. She exhibits no distension and no mass. There is no tenderness. There is no rebound and no guarding.  Musculoskeletal: She exhibits no edema or tenderness.  Lymphadenopathy:    She has no cervical adenopathy.  Neurological: She displays normal reflexes. No cranial nerve deficit. She exhibits normal muscle tone. Coordination abnormal.  Skin: No rash noted. No erythema.  Psychiatric: She has a normal mood and affect. Her behavior is normal. Judgment and thought content normal.   knees w/OA ataxic  Procedure: EKG Indication: SOB Impression: NSR. No acute changes.   Lab Results  Component Value Date   WBC 8.1 04/29/2015   HGB 13.5 04/29/2015   HCT 39.7 04/29/2015   PLT 235 04/29/2015   GLUCOSE 113 (H) 05/10/2016   CHOL 181 12/23/2014   TRIG 179.0 (H) 12/23/2014   HDL 57.00 12/23/2014   LDLDIRECT 186.1 03/24/2013   LDLCALC 89 12/23/2014   ALT 11 11/01/2015   AST 20 11/01/2015   NA 139 05/10/2016   K 3.4 (L) 05/10/2016   CL 105 05/10/2016   CREATININE 0.95 05/10/2016   BUN 12 05/10/2016   CO2 28 05/10/2016   TSH 2.10 11/01/2015   INR 1.44 01/25/2012   HGBA1C 6.1 05/10/2016    Dg Bone Density  Result Date: 10/05/2015 Date of study: 10/03/2015 Exam: DUAL X-RAY ABSORPTIOMETRY (DXA) FOR BONE MINERAL DENSITY (BMD) Instrument: [employer] Therapist, [employer] Requesting Provider: PCP Indication: screening for osteoporosis Comparison: none (please note that it is not possible to compare data from different instruments) Clinical data: Pt is a 82 y.o.  female with history of rib fracture. On vitamin D supplements. Results:  Lumbar spine (L2, L4) Femoral neck (FN) Change in BMD from previous DXA test (%) -2.9  RFN:-2.6 LFN:-1.8  (*) statistically significant Assessment: Patient has O[facility]OPOROSIS [facility] to the [facility] classification [facility] osteoporosis (see below). Fracture risk: high Comments: the technical quality of the study is good, however, the spine is scoliotic and arthritic. Calcium accumulation in arthritic sites can confound the results of the bone density scan. L1 and L3 vertebrae had to be excluded from analysis. Evaluation for secondary causes should be considered if clinically indicated. Recommend optimizing calcium (1200 mg/day) and vitamin D (800 IU/day). Followup: Repeat BMD is appropriate after 1-2 years WHO criteria for diagnosis of osteoporosis in postmenopausal women and in men 80 y/o or older: - normal: T-score -1.0 to + 1.0 - osteopenia/low bone density: T-score between -2.5  and -1.0 - osteoporosis: T-score below -2.5 - severe osteoporosis: T-score below -2.5 with history of fragility fracture Note: although not part of the WHO classification, the presence of a fragility fracture, regardless of the T-score, should be considered diagnostic of osteoporosis, provided other causes for the fracture have been excluded. Treatment: The National Osteoporosis Foundation recommends that treatment be considered in postmenopausal women and men age 73 or older with: 1. Hip or vertebral (clinical or morphometric) fracture 2. T-score of - 2.5 or lower at the spine or hip 3. 10-year fracture probability by FRAX of at least 20% for a major osteoporotic fracture and 3% for a hip fracture Carlus Pavlov, MD [facility]    Assessment & Plan:   There are no diagnoses linked to this encounter. I am having Ms. Jech maintain her vitamin B-12, cholecalciferol, MAXIMUM RED KRILL, ALPRAZolam, benzonatate, fluticasone furoate-vilanterol, gabapentin,  denosumab, potassium chloride SA, metoprolol succinate, Multiple Vitamins-Minerals (MULTIPLE VITAMINS/WOMENS PO), Multiple Vitamins-Minerals (EYE VITAMINS PO), Vitamin E, temazepam, amLODipine, potassium chloride, ranitidine, escitalopram, triamterene-hydrochlorothiazide, rosuvastatin, and ibuprofen.  No orders of the defined types were placed in this encounter.    Follow-up: No Follow-up on file.  Sonda Primes, MD

## 2016-08-14 NOTE — Assessment & Plan Note (Signed)
PT

## 2016-08-14 NOTE — Assessment & Plan Note (Addendum)
F/u w/Dr Tenny Crawoss for moderate AS on ECHO Toprol, Amlodipine

## 2016-08-27 ENCOUNTER — Ambulatory Visit: Payer: Medicare HMO | Attending: Internal Medicine | Admitting: Physical Therapy

## 2016-08-27 ENCOUNTER — Encounter: Payer: Self-pay | Admitting: Physical Therapy

## 2016-08-27 DIAGNOSIS — M25512 Pain in left shoulder: Secondary | ICD-10-CM

## 2016-08-27 DIAGNOSIS — R2689 Other abnormalities of gait and mobility: Secondary | ICD-10-CM | POA: Diagnosis not present

## 2016-08-27 NOTE — Therapy (Signed)
[facility] [address] [place], [place], [address] Phone: [phone]   Fax:  [phone]  Physical Therapy Evaluation  Patient Details  Name: Terri King MRN: [id number] Date of Birth: 1934-07-30 Referring Provider: Tresa Garter, MD  Encounter Date: 08/27/2016      PT End of Session - 08/27/16 1008    Visit Number 1   Number of Visits 17   Date for PT Re-Evaluation 10/26/16   Authorization Type HUMA[employer] MCR- KX at visit 15   PT Start Time 1010   PT Stop Time 1100   PT Time Calculation (min) 50 min   Activity Tolerance Patient tolerated treatment well   Behavior During Therapy [facility] for tasks assessed/performed      Past Medical History:  Diagnosis Date  . CAD (coronary artery disease)    DR Tenny Craw; by notes normal cath in 1986, normal stress 2013  . Family history of anesthesia complication    NIENCE had sensitivity  . History of pneumonia 2008  . HTN (hypertension)    dr Posey Rea  . Hyperlipemia   . MI (myocardial infarction) (HCC)    DR Tenny Craw  . Osteoarthritis   . Shortness of breath     Past Surgical History:  Procedure Laterality Date  . JOINT REPLACEMENT  12/13   L TKR   . KNEE ARTHROSCOPY     left  . PARTIAL HYSTERECTOMY    . TONSILLECTOMY    . TOTAL KNEE ARTHROPLASTY  01/23/2012   Procedure: TOTAL KNEE ARTHROPLASTY;  Surgeon: Loreta Ave, MD;  Location: [facility] OR;  Service: Orthopedics;  Laterality: Left;  . TOTAL KNEE ARTHROPLASTY  12/'05/2011   left knee    There were no vitals filed for this visit.       Subjective Assessment - 08/27/16 1015    Subjective Pt reports April, 2018 standing in the kitchen when she fell backwards all of the sudden. Severe pain when she came to. Went to ED the next day, reports diagnosis of rib fracture. Unable to turn over in bed, L arm and shoulder constatly ache, discomfort wrapping to back. Occasional pain in ribs when taking a deep breath. Denies any  other falls than this incident. Feels a little unsteady on her feet in the AM but gets better over the day. Is able to climb 3 level home using railing. Feels unsteady with fatigue such as walking at grand daughters graduation and it was warm.    Patient Stated Goals comb hair with L arm, passing objects at table, reaching into cabinets   Currently in Pain? Yes   Pain Score 8    Pain Location Shoulder   Pain Orientation Left   Pain Descriptors / Indicators Aching   Pain Onset More than a month ago   Pain Frequency Constant   Aggravating Factors  reaching, lifting, deep breath (occasionally)   Pain Relieving Factors rest, pain medications            [facility] PT Assessment - 08/27/16 0001      Assessment   Medical Diagnosis chronic L shoulder pain, falls, gait disorder   Referring Provider Tresa Garter, MD   Onset Date/Surgical Date --  April 2018   Hand Dominance Left   Next MD Visit --  3 months   Prior Therapy none     Precautions   Precautions Fall   Precaution Comments H/O MI     Restrictions   Weight Bearing Restrictions No  Balance Screen   Has the patient fallen in the past 6 months Yes   How many times? 1   Has the patient had a decrease in activity level because of a fear of falling?  Yes  mild    Is the patient reluctant to leave their home because of a fear of falling?  No     Home [employer]   Living Environment Private residence   Living Arrangements Alone   Additional Comments 3 floor home 1 railing     Prior Function   Level of Independence Independent     Cognition   Overall Cognitive Status Within Functional Limits for tasks assessed     Observation/Other Assessments   Focus on Therapeutic Outcomes (FOTO)  63% limitation (goal 41%)     Sensation   Additional Comments N/T in hands and feet- occasional     Posture/Postural Control   Posture Comments trunk lean to L with L gHJ depression     ROM / Strength   AROM / PROM / Strength  AROM;Strength;PROM     AROM   Overall AROM  Other (comment)   Overall AROM Comments pain bilat in all motions, no shoulder hike noted   AROM Assessment Site Shoulder   Right/Left Shoulder Right;Left   Right Shoulder Extension 40 Degrees   Right Shoulder Flexion 105 Degrees   Right Shoulder ABduction 85 Degrees   Left Shoulder Extension 30 Degrees   Left Shoulder Flexion 50 Degrees   Left Shoulder ABduction 50 Degrees     PROM   Overall PROM Comments WFL, painful     Strength   Strength Assessment Site Shoulder   Right/Left Shoulder Right;Left   Right Shoulder Internal Rotation 3+/5   Right Shoulder External Rotation 4+/5   Left Shoulder Flexion 3-/5   Left Shoulder Extension 3-/5   Left Shoulder ABduction 3-/5   Left Shoulder Internal Rotation 3+/5   Left Shoulder External Rotation 4+/5     Standardized Balance Assessment   Standardized Balance Assessment Dynamic Gait Index;Berg Balance Test     Berg Balance Test   Sit to Stand Able to stand without using hands and stabilize independently   Standing Unsupported Able to stand safely 2 minutes   Sitting with Back Unsupported but Feet Supported on Floor or Stool Able to sit safely and securely 2 minutes   Stand to Sit Sits safely with minimal use of hands   Transfers Able to transfer safely, minor use of hands   Standing Unsupported with Eyes Closed Able to stand 10 seconds safely   Standing Ubsupported with Feet Together Able to place feet together independently and stand for 1 minute with supervision   From Standing, Reach Forward with Outstretched Arm Can reach forward >5 cm safely (2")   From Standing Position, Pick up Object from Floor Able to pick up shoe, needs supervision   From Standing Position, Turn to Look Behind Over each Shoulder Looks behind from both sides and weight shifts well   Turn 360 Degrees Able to turn 360 degrees safely one side only in 4 seconds or less   Standing Unsupported, Alternately Place Feet  on Step/Stool Able to complete >2 steps/needs minimal assist   Standing Unsupported, One Foot in Front Able to plae foot ahead of the other independently and hold 30 seconds   Standing on One Leg Able to lift leg independently and hold equal to or more than 3 seconds   Total Score 45     Dynamic  Gait Index   Level Surface Normal   Change in Gait Speed Severe Impairment   Gait with Horizontal Head Turns Mild Impairment   Gait with Vertical Head Turns Mild Impairment   Gait and Pivot Turn Normal   Step Over Obstacle Moderate Impairment   Step Around Obstacles Normal   Steps Mild Impairment   Total Score 16            Objective measurements completed on examination: See above findings.          [facility] Adult PT Treatment/Exercise - 08/27/16 0001      Exercises   Exercises Shoulder     Shoulder Exercises: Seated   Other Seated Exercises scapular retractions     Shoulder Exercises: Stretch   Table Stretch -Flexion Limitations towel slides on table                  PT Short Term Goals - 08/27/16 1318      PT SHORT TERM GOAL #1   Title Pt will demo 10 deg increase in GHJ AROM for improved functional use by 8/3   Baseline see flowsheet   Time 3   Period Weeks   Status New     PT SHORT TERM GOAL #2   Title Resolution of pain under L breast   Baseline constant ache at eval   Time 3   Period Weeks   Status New           PT Long Term Goals - 08/27/16 1315      PT LONG TERM GOAL #1   Title FOTO to 41% limitation to indicate significant improvement in functional ability by 9/7   Baseline 63% limitation at eval   Time 8   Period Weeks   Status New     PT LONG TERM GOAL #2   Title DGI to 19/24 or better   Baseline 16/24 at eval, MDC 2.9   Time 8   Period Weeks   Status New     PT LONG TERM GOAL #3   Title BERG to 48/56 or better   Baseline 45/56 at eval, MDC 3.3   Time 8   Period Weeks   Status New     PT LONG TERM GOAL #4   Title Pt  will be able to use her L arm for self care activies pain <=3/10   Baseline unable to use arm for most activities at eval   Time 8   Period Weeks   Status New                Plan - 08/27/16 1139    Clinical Impression Statement Pt presents to PT with complaints of L shoulder pain and limited functional use. L is her dominant side. Pt denies any other falls other than the one that caused injury and is mobile in her home and community with limitations by SOB and fatigue. Pt requested to sit after walking from waiitng room to evaluation room (approx 222ft). Pt did well with static balance challenges and demo moderate limitation in dynamic balance. She does not use any AD. GHJ severely limited in functional use and ROM due to pain. No shoulder hike indicating low probability of full RC tear. Pain under L breast that wraps around to thoracic region of spine along rib. Frequent, monitored rest breaks required. Pt will benefit from skilled PT in order to improve functional use of shoulder as well as challenge balance ability to decrease risk  of future falls.    History and Personal Factors relevant to plan of care: SOB, CAD, HTN, OA, reported N/T in hands and feet   Clinical Presentation Stable   Clinical Presentation due to: n/a   Clinical Decision Making Low   Rehab Potential Good   PT Frequency 2x / week   PT Duration 8 weeks   PT Treatment/Interventions ADLs/Self Care Home Management;Cryotherapy;Electrical Stimulation;Iontophoresis 4mg /ml Dexamethasone;Functional mobility training;Stair training;Gait training;Ultrasound;Traction;Moist Heat;Therapeutic activities;Therapeutic exercise;Balance training;Neuromuscular re-education;Patient/family education;Passive range of motion;Dry needling;Taping;Vasopneumatic Device   PT Next Visit Plan GHJ ROM-passive & AAROM   PT Home Exercise Plan scapular retraction, shoulder flexion table slides   Consulted and Agree with Plan of Care Patient       Patient will benefit from skilled therapeutic intervention in order to improve the following deficits and impairments:  Decreased range of motion, Difficulty walking, Impaired UE functional use, Increased muscle spasms, Decreased endurance, Cardiopulmonary status limiting activity, Decreased activity tolerance, Pain, Impaired flexibility, Decreased balance, Decreased mobility, Decreased strength, Postural dysfunction, Impaired sensation  Visit Diagnosis: Acute pain of left shoulder - Plan: PT plan of care cert/re-cert  Other abnormalities of gait and mobility - Plan: PT plan of care cert/re-cert      G-Codes - 04-Sep-2016 1321    Functional Assessment Tool Used (Outpatient Only) FOTO 63% limited (goal 41%), clinical judgement   Functional Limitation Carrying, moving and handling objects   Carrying, Moving and Handling Objects Current Status (W1191) At least 60 percent but less than 80 percent impaired, limited or restricted   Carrying, Moving and Handling Objects Goal Status (Y7829) At least 40 percent but less than 60 percent impaired, limited or restricted       Problem List Patient Active Problem List   Diagnosis Date Noted  . Shoulder pain, left 08/14/2016  . Aortic stenosis, moderate 08/14/2016  . Osteoporosis 11/01/2015  . Cough 09/02/2015  . Heart valve disease 06/14/2015  . Insomnia 10/22/2013  . UTI (urinary tract infection) 08/24/2013  . Preop exam for internal medicine 01/16/2012  . Ankle pain 07/11/2011  . Adjustment disorder with mixed anxiety and depressed mood 03/28/2011  . GERD (gastroesophageal reflux disease) 12/25/2010  . Dysphagia 12/25/2010  . Coronary atherosclerosis 02/09/2010  . FEVER UNSPECIFIED 01/18/2010  . CHEST PAIN 01/18/2010  . Urinary incontinence 01/18/2010  . BRONCHITIS NOT SPECIFIED AS ACUTE OR CHRONIC 12/05/2009  . NEURALGIA, TRIGEMINAL 07/04/2009  . EAR PAIN 07/04/2009  . HYPOKALEMIA 02/17/2009  . Arthralgia 02/17/2009  . Hyperglycemia  09/02/2008  . Osteoarthritis 03/11/2008  . EDEMA 03/11/2008  . KNEE PAIN 02/04/2008  . CHEST XRAY, ABNORMAL 07/28/2007  . Dyslipidemia 03/17/2007  . Essential hypertension 03/17/2007  . UPPER RESPIRATORY INFECTION (URI) 03/17/2007  . VAGINITIS 03/17/2007    Riyan Haile C. Kyarah Enamorado PT, DPT 09-04-16 1:24 PM   [facility] [address] [place], [place], [address] Phone: [phone]   Fax:  [phone]  Name: SHRUTI ARREY MRN: [id number] Date of Birth: Jan 31, 1935

## 2016-09-01 ENCOUNTER — Other Ambulatory Visit: Payer: Self-pay | Admitting: Internal Medicine

## 2016-09-03 ENCOUNTER — Ambulatory Visit: Payer: Medicare HMO | Admitting: Physical Therapy

## 2016-09-03 ENCOUNTER — Encounter: Payer: Self-pay | Admitting: Physical Therapy

## 2016-09-03 DIAGNOSIS — R2689 Other abnormalities of gait and mobility: Secondary | ICD-10-CM

## 2016-09-03 DIAGNOSIS — M25512 Pain in left shoulder: Secondary | ICD-10-CM | POA: Diagnosis not present

## 2016-09-03 NOTE — Therapy (Signed)
[facility] [address] [place], [place], [address] Phone: [phone]   Fax:  [phone]  Physical Therapy Treatment  Patient Details  Name: Terri King MRN: [id number] Date of Birth: Dec 28, 1934 Referring Provider: Tresa Garter, MD  Encounter Date: 09/03/2016      PT End of Session - 09/03/16 0934    Visit Number 2   Number of Visits 17   Date for PT Re-Evaluation 10/26/16   Authorization Type [employer] MCR- KX at visit 15   PT Start Time 0934   PT Stop Time 1013   PT Time Calculation (min) 39 min   Activity Tolerance Patient tolerated treatment well   Behavior During Therapy [facility] for tasks assessed/performed      Past Medical History:  Diagnosis Date  . CAD (coronary artery disease)    DR Tenny Craw; by notes normal cath in 1986, normal stress 2013  . Family history of anesthesia complication    NIENCE had sensitivity  . History of pneumonia 2008  . HTN (hypertension)    dr Posey Rea  . Hyperlipemia   . MI (myocardial infarction) (HCC)    DR Tenny Craw  . Osteoarthritis   . Shortness of breath     Past Surgical History:  Procedure Laterality Date  . JOINT REPLACEMENT  12/13   L TKR   . KNEE ARTHROSCOPY     left  . PARTIAL HYSTERECTOMY    . TONSILLECTOMY    . TOTAL KNEE ARTHROPLASTY  01/23/2012   Procedure: TOTAL KNEE ARTHROPLASTY;  Surgeon: Loreta Ave, MD;  Location: [facility] OR;  Service: Orthopedics;  Laterality: Left;  . TOTAL KNEE ARTHROPLASTY  12/'05/2011   left knee    There were no vitals filed for this visit.      Subjective Assessment - 09/03/16 0934    Subjective Pt reports doing her exercises, feeling a little better. A little ache in L rib cage on Saturday, maybe due to weather.    Patient Stated Goals comb hair with L arm, passing objects at table, reaching into cabinets   Currently in Pain? No/denies                         [facility] Adult PT Treatment/Exercise - 09/03/16  0001      Exercises   Exercises Knee/Hip     Knee/Hip Exercises: Stretches   Passive Hamstring Stretch Limitations seated EOB     Knee/Hip Exercises: Aerobic   Nustep 5 min L4, arms holding handles but passively moving     Knee/Hip Exercises: Standing   Heel Raises Limitations heel toe raises, UE support on chair/back to mat   Other Standing Knee Exercises NBOS with head turns     Shoulder Exercises: Supine   Flexion Limitations AAROM with wand, punch to ceiling      Shoulder Exercises: Seated   Other Seated Exercises seated squeeze on physioball with deep breathing     Shoulder Exercises: Sidelying   External Rotation 20 reps  2 sets   Flexion 20 reps  using ranger   Other Sidelying Exercises reach fwd on ranger 90 & 120                PT Education - 09/03/16 1015    Education provided Yes   Education Details exercise form/rationale, sleeping posture, bed mobility   Person(s) Educated Patient   Methods Explanation;Demonstration;Tactile cues;Verbal cues;Handout   Comprehension Verbalized understanding;Returned demonstration;Verbal cues required;Tactile cues required;Need further  instruction          PT Short Term Goals - 08/27/16 1318      PT SHORT TERM GOAL #1   Title Pt will demo 10 deg increase in GHJ AROM for improved functional use by 8/3   Baseline see flowsheet   Time 3   Period Weeks   Status New     PT SHORT TERM GOAL #2   Title Resolution of pain under L breast   Baseline constant ache at eval   Time 3   Period Weeks   Status New           PT Long Term Goals - 08/27/16 1315      PT LONG TERM GOAL #1   Title FOTO to 41% limitation to indicate significant improvement in functional ability by 9/7   Baseline 63% limitation at eval   Time 8   Period Weeks   Status New     PT LONG TERM GOAL #2   Title DGI to 19/24 or better   Baseline 16/24 at eval, MDC 2.9   Time 8   Period Weeks   Status New     PT LONG TERM GOAL #3   Title  BERG to 48/56 or better   Baseline 45/56 at eval, MDC 3.3   Time 8   Period Weeks   Status New     PT LONG TERM GOAL #4   Title Pt will be able to use her L arm for self care activies pain <=3/10   Baseline unable to use arm for most activities at eval   Time 8   Period Weeks   Status New               Plan - 09/03/16 1014    Clinical Impression Statement Pain initially with AAROM shoulder movements but reduced with movement. AROM significantly limited and painful in L rib cage. Denies pain in shoulder with movement. Heavy emphasis on safety with standing exercises at home    PT Treatment/Interventions ADLs/Self Care Home Management;Cryotherapy;Electrical Stimulation;Iontophoresis 4mg /ml Dexamethasone;Functional mobility training;Stair training;Gait training;Ultrasound;Traction;Moist Heat;Therapeutic activities;Therapeutic exercise;Balance training;Neuromuscular re-education;Patient/family education;Passive range of motion;Dry needling;Taping;Vasopneumatic Device   PT Next Visit Plan GHJ ROM-passive & AAROM, standing balance   PT Home Exercise Plan scapular retraction, shoulder flexion table slides; seated HSS, hee/toe raises, sidelying GHJ ER;    Consulted and Agree with Plan of Care Patient      Patient will benefit from skilled therapeutic intervention in order to improve the following deficits and impairments:  Decreased range of motion, Difficulty walking, Impaired UE functional use, Increased muscle spasms, Decreased endurance, Cardiopulmonary status limiting activity, Decreased activity tolerance, Pain, Impaired flexibility, Decreased balance, Decreased mobility, Decreased strength, Postural dysfunction, Impaired sensation  Visit Diagnosis: Acute pain of left shoulder  Other abnormalities of gait and mobility     Problem List Patient Active Problem List   Diagnosis Date Noted  . Shoulder pain, left 08/14/2016  . Aortic stenosis, moderate 08/14/2016  . Osteoporosis  11/01/2015  . Cough 09/02/2015  . Heart valve disease 06/14/2015  . Insomnia 10/22/2013  . UTI (urinary tract infection) 08/24/2013  . Preop exam for internal medicine 01/16/2012  . Ankle pain 07/11/2011  . Adjustment disorder with mixed anxiety and depressed mood 03/28/2011  . GERD (gastroesophageal reflux disease) 12/25/2010  . Dysphagia 12/25/2010  . Coronary atherosclerosis 02/09/2010  . FEVER UNSPECIFIED 01/18/2010  . CHEST PAIN 01/18/2010  . Urinary incontinence 01/18/2010  . BRONCHITIS NOT SPECIFIED AS  ACUTE OR CHRONIC 12/05/2009  . NEURALGIA, TRIGEMINAL 07/04/2009  . EAR PAIN 07/04/2009  . HYPOKALEMIA 02/17/2009  . Arthralgia 02/17/2009  . Hyperglycemia 09/02/2008  . Osteoarthritis 03/11/2008  . EDEMA 03/11/2008  . KNEE PAIN 02/04/2008  . CHEST XRAY, ABNORMAL 07/28/2007  . Dyslipidemia 03/17/2007  . Essential hypertension 03/17/2007  . UPPER RESPIRATORY INFECTION (URI) 03/17/2007  . VAGINITIS 03/17/2007    Aleathia Purdy C. Lela Gell PT, DPT 09/03/16 10:21 AM   [facility] [address] [place], [place], [address] Phone: [phone]   Fax:  [phone]  Name: Terri King MRN: [id number] Date of Birth: 02/26/1934

## 2016-09-05 ENCOUNTER — Ambulatory Visit: Payer: Medicare HMO | Admitting: Physical Therapy

## 2016-09-05 ENCOUNTER — Other Ambulatory Visit: Payer: Self-pay | Admitting: Internal Medicine

## 2016-09-05 DIAGNOSIS — R2689 Other abnormalities of gait and mobility: Secondary | ICD-10-CM | POA: Diagnosis not present

## 2016-09-05 DIAGNOSIS — M25512 Pain in left shoulder: Secondary | ICD-10-CM | POA: Diagnosis not present

## 2016-09-05 NOTE — Therapy (Signed)
[facility] [address] [place], [place], [address] Phone: [phone]   Fax:  [phone]  Physical Therapy Treatment  Patient Details  Name: Terri King MRN: [id number] Date of Birth: [birth year] Referring Provider: Tresa Garter, MD  Encounter Date: 09/05/2016      PT End of Session - 09/05/16 1006    Visit Number 3   Number of Visits 17   Date for PT Re-Evaluation 10/26/16   Authorization Type [employer] MCR- KX at visit 15   PT Start Time 0933   PT Stop Time 1011   PT Time Calculation (min) 38 min      Past Medical History:  Diagnosis Date  . CAD (coronary artery disease)    DR Tenny Craw; by notes normal cath in 1986, normal stress 2013  . Family history of anesthesia complication    NIENCE had sensitivity  . History of pneumonia 2008  . HTN (hypertension)    dr Posey Rea  . Hyperlipemia   . MI (myocardial infarction) (HCC)    DR Tenny Craw  . Osteoarthritis   . Shortness of breath     Past Surgical History:  Procedure Laterality Date  . JOINT REPLACEMENT  12/13   L TKR   . KNEE ARTHROSCOPY     left  . PARTIAL HYSTERECTOMY    . TONSILLECTOMY    . TOTAL KNEE ARTHROPLASTY  01/23/2012   Procedure: TOTAL KNEE ARTHROPLASTY;  Surgeon: Loreta Ave, MD;  Location: [facility] OR;  Service: Orthopedics;  Laterality: Left;  . TOTAL KNEE ARTHROPLASTY  12/'05/2011   left knee    There were no vitals filed for this visit.      Subjective Assessment - 09/05/16 0936    Subjective SHoulder aches sometimes, no pain right now.    Currently in Pain? No/denies            [facility] PT Assessment - 09/05/16 0001      AROM   Right Shoulder ABduction 110 Degrees   Left Shoulder Flexion 105 Degrees   Left Shoulder ABduction 90 Degrees                     [facility] Adult PT Treatment/Exercise - 09/05/16 0001      Knee/Hip Exercises: Standing   Heel Raises Limitations heel toe raises, UE support on chair/back to  mat   Other Standing Knee Exercises NBOS with head turns and trunk rotations, eyes closed, SLS with 2 finger support 20 sec x 2 each , alternating step taps and unilateral step taps to 6 inch step x 10 each , CGA for all      Knee/Hip Exercises: Seated   Sit to Sand 5 reps;without UE support     Shoulder Exercises: Supine   Protraction 10 reps   Flexion Limitations AROM punches    Other Supine Exercises triceps press x 10    Other Supine Exercises supine cane all exercises x 15-20 each      Shoulder Exercises: Seated   Other Seated Exercises scap squeeze x 10    Other Seated Exercises Table Slides at high table x 15 bilateral      Shoulder Exercises: Sidelying   External Rotation 20 reps   ABduction 5 reps  pain                  PT Short Term Goals - 08/27/16 1318      PT SHORT TERM GOAL #1   Title Pt will  demo 10 deg increase in GHJ AROM for improved functional use by 8/3   Baseline see flowsheet   Time 3   Period Weeks   Status New     PT SHORT TERM GOAL #2   Title Resolution of pain under L breast   Baseline constant ache at eval   Time 3   Period Weeks   Status New           PT Long Term Goals - 08/27/16 1315      PT LONG TERM GOAL #1   Title FOTO to 41% limitation to indicate significant improvement in functional ability by 9/7   Baseline 63% limitation at eval   Time 8   Period Weeks   Status New     PT LONG TERM GOAL #2   Title DGI to 19/24 or better   Baseline 16/24 at eval, MDC 2.9   Time 8   Period Weeks   Status New     PT LONG TERM GOAL #3   Title BERG to 48/56 or better   Baseline 45/56 at eval, MDC 3.3   Time 8   Period Weeks   Status New     PT LONG TERM GOAL #4   Title Pt will be able to use her L arm for self care activies pain <=3/10   Baseline unable to use arm for most activities at eval   Time 8   Period Weeks   Status New               Plan - 09/05/16 1018    Clinical Impression Statement Pt  demonstrates improved active shoulder ROM. Able to perform supine cane exercises with cues for painfree ROM. Continued Balance and LE exercises.    PT Next Visit Plan GHJ ROM-passive & AAROM, standing balance   PT Home Exercise Plan scapular retraction, shoulder flexion table slides; seated HSS, hee/toe raises, sidelying GHJ ER;    Consulted and Agree with Plan of Care Patient      Patient will benefit from skilled therapeutic intervention in order to improve the following deficits and impairments:  Decreased range of motion, Difficulty walking, Impaired UE functional use, Increased muscle spasms, Decreased endurance, Cardiopulmonary status limiting activity, Decreased activity tolerance, Pain, Impaired flexibility, Decreased balance, Decreased mobility, Decreased strength, Postural dysfunction, Impaired sensation  Visit Diagnosis: Acute pain of left shoulder  Other abnormalities of gait and mobility     Problem List Patient Active Problem List   Diagnosis Date Noted  . Shoulder pain, left 08/14/2016  . Aortic stenosis, moderate 08/14/2016  . Osteoporosis 11/01/2015  . Cough 09/02/2015  . Heart valve disease 06/14/2015  . Insomnia 10/22/2013  . UTI (urinary tract infection) 08/24/2013  . Preop exam for internal medicine 01/16/2012  . Ankle pain 07/11/2011  . Adjustment disorder with mixed anxiety and depressed mood 03/28/2011  . GERD (gastroesophageal reflux disease) 12/25/2010  . Dysphagia 12/25/2010  . Coronary atherosclerosis 02/09/2010  . FEVER UNSPECIFIED 01/18/2010  . CHEST PAIN 01/18/2010  . Urinary incontinence 01/18/2010  . BRONCHITIS NOT SPECIFIED AS ACUTE OR CHRONIC 12/05/2009  . NEURALGIA, TRIGEMINAL 07/04/2009  . EAR PAIN 07/04/2009  . HYPOKALEMIA 02/17/2009  . Arthralgia 02/17/2009  . Hyperglycemia 09/02/2008  . Osteoarthritis 03/11/2008  . EDEMA 03/11/2008  . KNEE PAIN 02/04/2008  . CHEST XRAY, ABNORMAL 07/28/2007  . Dyslipidemia 03/17/2007  . Essential  hypertension 03/17/2007  . UPPER RESPIRATORY INFECTION (URI) 03/17/2007  . VAGINITIS 03/17/2007    Jannette Spanner  [place], [place] 09/05/2016, 10:23 AM  [facility] [address] [place], [place], [address] Phone: [phone]   Fax:  [phone]  Name: Terri King MRN: [id number] Date of Birth: 1934-11-16

## 2016-09-12 ENCOUNTER — Other Ambulatory Visit: Payer: Self-pay | Admitting: Internal Medicine

## 2016-09-12 MED ORDER — ALPRAZOLAM 0.25 MG PO TABS: 0.1250 mg | ORAL_TABLET | Freq: Two times a day (BID) | ORAL | 3 refills | Status: DC | PRN

## 2016-09-12 NOTE — Telephone Encounter (Signed)
Called refill into [facility] spoke w/Heidi Scientist, research (physical sciences)(pharmacist) gave MD authorization for alprazolam.../lmb

## 2016-09-13 NOTE — Telephone Encounter (Signed)
Pt called regarding this.  °

## 2016-09-13 NOTE — Telephone Encounter (Signed)
Last OV was 08/14/16 and [name] said to continue taking the medication. Please advise about refill.

## 2016-09-14 DIAGNOSIS — Z1231 Encounter for screening mammogram for malignant neoplasm of breast: Secondary | ICD-10-CM | POA: Diagnosis not present

## 2016-09-14 DIAGNOSIS — Z806 Family history of leukemia: Secondary | ICD-10-CM | POA: Diagnosis not present

## 2016-09-14 LAB — HM MAMMOGRAPHY

## 2016-09-14 NOTE — Telephone Encounter (Signed)
RX faxed to [facility] 

## 2016-09-17 ENCOUNTER — Ambulatory Visit: Payer: Medicare HMO | Admitting: Physical Therapy

## 2016-09-17 ENCOUNTER — Encounter: Payer: Self-pay | Admitting: Physical Therapy

## 2016-09-17 ENCOUNTER — Telehealth: Payer: Self-pay

## 2016-09-17 DIAGNOSIS — M25512 Pain in left shoulder: Secondary | ICD-10-CM | POA: Diagnosis not present

## 2016-09-17 DIAGNOSIS — R2689 Other abnormalities of gait and mobility: Secondary | ICD-10-CM | POA: Diagnosis not present

## 2016-09-17 NOTE — Telephone Encounter (Signed)
Left voicemail that prolia injection is due again---PA approval recd from insurance and estimated copay $220 is due at time of injection---patient can get at earliest convenience, her last injection was 12/07/15---can talk with Kaymen Adrian if any questions

## 2016-09-17 NOTE — Therapy (Signed)
[place], [place], [address] Phone: [phone]   Fax:  [phone]  Physical Therapy Treatment  Patient Details  Name: Terri King MRN: [id number] Date of Birth: 06/07/1934 Referring Provider: Cassandria Anger, MD  Encounter Date: 09/17/2016      PT End of Session - 09/17/16 1333    Visit Number 4   Number of Visits 17   Date for PT Re-Evaluation 10/26/16   Authorization Type [employer] MCR- KX at visit 15   PT Start Time 1333   PT Stop Time 1414   PT Time Calculation (min) 41 min   Activity Tolerance Patient tolerated treatment well   Behavior During Therapy [facility] for tasks assessed/performed      Past Medical History:  Diagnosis Date  . CAD (coronary artery disease)    DR Harrington Challenger; by notes normal cath in 1986, normal stress 2013  . Family history of anesthesia complication    NIENCE had sensitivity  . History of pneumonia 2008  . HTN (hypertension)    dr Alain Marion  . Hyperlipemia   . MI (myocardial infarction) ([place])    DR Harrington Challenger  . Osteoarthritis   . Shortness of breath     Past Surgical History:  Procedure Laterality Date  . JOINT REPLACEMENT  12/13   L TKR   . KNEE ARTHROSCOPY     left  . PARTIAL HYSTERECTOMY    . TONSILLECTOMY    . TOTAL KNEE ARTHROPLASTY  01/23/2012   Procedure: TOTAL KNEE ARTHROPLASTY;  Surgeon: Ninetta Lights, MD;  Location: [place];  Service: Orthopedics;  Laterality: Left;  . TOTAL KNEE ARTHROPLASTY  12/'05/2011   left knee    There were no vitals filed for this visit.      Subjective Assessment - 09/17/16 1333    Subjective It rained (when asked how shoulder feels). Not severe pain, but some. R knee hurting today as well due to weather.    Patient Stated Goals comb hair with L arm, passing objects at table, reaching into cabinets   Currently in Pain? Yes   Pain Score 7    Pain Location Shoulder   Pain Orientation Left   Pain Descriptors / Indicators  Sore   Aggravating Factors  turning over in bed, reaching too high            [facility] PT Assessment - 09/17/16 0001      AROM   Right Shoulder ABduction 108 Degrees   Left Shoulder Flexion 92 Degrees   Left Shoulder ABduction 58 Degrees                     [facility] Adult PT Treatment/Exercise - 09/17/16 0001      Shoulder Exercises: Supine   Flexion Limitations hands in loops of stretch strap, flx to 90 + serratus punch   Other Supine Exercises deep breathing for rib mobs     Shoulder Exercises: Seated   Row 20 reps;Both   Theraband Level (Shoulder Row) Level 1 (Yellow)   Row Limitations single arm   External Rotation 20 reps;AROM   Other Seated Exercises biceps curls-maint tension on yellow tband   Other Seated Exercises thoracic ext over chair with breathing     Shoulder Exercises: Pulleys   Flexion 2 minutes   ABduction 2 minutes     Shoulder Exercises: Stretch   Table Stretch -Flexion Limitations physioball rolls, bilat & single arm     Manual Therapy  Manual Therapy Passive ROM;Soft tissue mobilization   Manual therapy comments multiple cues to allow PROM   Soft tissue mobilization L pectoralis, upper trap   Passive ROM L GHJ all motions                  PT Short Term Goals - 09/17/16 1336      PT SHORT TERM GOAL #1   Title Pt will demo 10 deg increase in GHJ AROM for improved functional use by 8/3   Baseline see flowsheet   Status On-going     PT SHORT TERM GOAL #2   Title Resolution of pain under L breast   Baseline only when she moves the wrong way, no longer constant   Status Partially Met           PT Long Term Goals - 08/27/16 1315      PT LONG TERM GOAL #1   Title FOTO to 41% limitation to indicate significant improvement in functional ability by 9/7   Baseline 63% limitation at eval   Time 8   Period Weeks   Status New     PT LONG TERM GOAL #2   Title DGI to 19/24 or better   Baseline 16/24 at eval, MDC 2.9    Time 8   Period Weeks   Status New     PT LONG TERM GOAL #3   Title BERG to 48/56 or better   Baseline 45/56 at eval, MDC 3.3   Time 8   Period Weeks   Status New     PT LONG TERM GOAL #4   Title Pt will be able to use her L arm for self care activies pain <=3/10   Baseline unable to use arm for most activities at eval   Time 8   Period Weeks   Status New               Plan - 09/17/16 1340    Clinical Impression Statement Focused on shoulder exercises today due to knee pain limiting weight bearing. GHJ ROM noted to be decreased today with increased soreness. Mild discomfort with rib mobilizations using breathing,    PT Treatment/Interventions ADLs/Self Care Home Management;Cryotherapy;Electrical Stimulation;Iontophoresis 80m/ml Dexamethasone;Functional mobility training;Stair training;Gait training;Ultrasound;Traction;Moist Heat;Therapeutic activities;Therapeutic exercise;Balance training;Neuromuscular re-education;Patient/family education;Passive range of motion;Dry needling;Taping;Vasopneumatic Device   PT Next Visit Plan GHJ ROM-passive & AAROM, standing balance as tol by knee pain   PT Home Exercise Plan scapular retraction, shoulder flexion table slides; seated HSS, hee/toe raises, sidelying GHJ ER;    Consulted and Agree with Plan of Care Patient      Patient will benefit from skilled therapeutic intervention in order to improve the following deficits and impairments:  Decreased range of motion, Difficulty walking, Impaired UE functional use, Increased muscle spasms, Decreased endurance, Cardiopulmonary status limiting activity, Decreased activity tolerance, Pain, Impaired flexibility, Decreased balance, Decreased mobility, Decreased strength, Postural dysfunction, Impaired sensation  Visit Diagnosis: Acute pain of left shoulder  Other abnormalities of gait and mobility     Problem List Patient Active Problem List   Diagnosis Date Noted  . Shoulder pain, left  08/14/2016  . Aortic stenosis, moderate 08/14/2016  . Osteoporosis 11/01/2015  . Cough 09/02/2015  . Heart valve disease 06/14/2015  . Insomnia 10/22/2013  . UTI (urinary tract infection) 08/24/2013  . Preop exam for internal medicine 01/16/2012  . Ankle pain 07/11/2011  . Adjustment disorder with mixed anxiety and depressed mood 03/28/2011  . GERD (gastroesophageal reflux disease)  12/25/2010  . Dysphagia 12/25/2010  . Coronary atherosclerosis 02/09/2010  . FEVER UNSPECIFIED 01/18/2010  . CHEST PAIN 01/18/2010  . Urinary incontinence 01/18/2010  . BRONCHITIS NOT SPECIFIED AS ACUTE OR CHRONIC 12/05/2009  . NEURALGIA, TRIGEMINAL 07/04/2009  . EAR PAIN 07/04/2009  . HYPOKALEMIA 02/17/2009  . Arthralgia 02/17/2009  . Hyperglycemia 09/02/2008  . Osteoarthritis 03/11/2008  . EDEMA 03/11/2008  . KNEE PAIN 02/04/2008  . CHEST XRAY, ABNORMAL 07/28/2007  . Dyslipidemia 03/17/2007  . Essential hypertension 03/17/2007  . UPPER RESPIRATORY INFECTION (URI) 03/17/2007  . VAGINITIS 03/17/2007    Denita Lun C. Lailee Hoelzel PT, DPT 09/17/16 2:17 PM   [facility] [address] [place], [place], [address] Phone: [phone]   Fax:  [phone]  Name: Terri King MRN: [id number] Date of Birth: May 21, 1934

## 2016-09-19 ENCOUNTER — Ambulatory Visit: Payer: Medicare HMO | Attending: Internal Medicine | Admitting: Physical Therapy

## 2016-09-19 ENCOUNTER — Encounter: Payer: Self-pay | Admitting: Physical Therapy

## 2016-09-19 DIAGNOSIS — M25512 Pain in left shoulder: Secondary | ICD-10-CM | POA: Insufficient documentation

## 2016-09-19 DIAGNOSIS — R2689 Other abnormalities of gait and mobility: Secondary | ICD-10-CM | POA: Diagnosis not present

## 2016-09-19 NOTE — Patient Instructions (Signed)
Issued from exercise drawer: 2-3 X a day 5-10 X Table slides.

## 2016-09-19 NOTE — Therapy (Addendum)
[place], [place], [address] Phone: [phone]   Fax:  [phone]  Physical Therapy Treatment/Discharge Summary  Patient Details  Name: Terri King MRN: [id number] Date of Birth: September 23, 1934 Referring Provider: Cassandria Anger, MD  Encounter Date: 09/19/2016      PT End of Session - 09/19/16 1421    Visit Number 5   Number of Visits 17   Date for PT Re-Evaluation 10/26/16   PT Start Time 1331   PT Stop Time 1425   PT Time Calculation (min) 54 min   Activity Tolerance Patient tolerated treatment well   Behavior During Therapy [facility] for tasks assessed/performed      Past Medical History:  Diagnosis Date  . CAD (coronary artery disease)    DR Harrington Challenger; by notes normal cath in 1986, normal stress 2013  . Family history of anesthesia complication    NIENCE had sensitivity  . History of pneumonia 2008  . HTN (hypertension)    dr Alain Marion  . Hyperlipemia   . MI (myocardial infarction) ([place])    DR Harrington Challenger  . Osteoarthritis   . Shortness of breath     Past Surgical History:  Procedure Laterality Date  . JOINT REPLACEMENT  12/13   L TKR   . KNEE ARTHROSCOPY     left  . PARTIAL HYSTERECTOMY    . TONSILLECTOMY    . TOTAL KNEE ARTHROPLASTY  01/23/2012   Procedure: TOTAL KNEE ARTHROPLASTY;  Surgeon: Ninetta Lights, MD;  Location: [place];  Service: Orthopedics;  Laterality: Left;  . TOTAL KNEE ARTHROPLASTY  12/'05/2011   left knee    There were no vitals filed for this visit.      Subjective Assessment - 09/19/16 1335    Subjective (P)  Back, upper, neck and shoulder,  knee left, right,  8/10     Currently in Pain? (P)  Yes   Pain Score (P)  8    Pain Location (P)  Shoulder   Pain Orientation (P)  Left            [facility] PT Assessment - 09/19/16 0001      AROM   Left Shoulder Flexion 90 Degrees                     [facility] Adult PT Treatment/Exercise - 09/19/16 0001       Shoulder Exercises: Supine   Flexion Limitations AA to 90 X2 then AROM  10 X flexion to 90 then serratus punch 10 X AA pain 6/10   Other Supine Exercises deep breathing for rib mobs  5 X   Other Supine Exercises head press 5 X 5 seconds     Shoulder Exercises: Seated   Other Seated Exercises UE ranger:  flexion , extension, IR/ER, small circles 10 X each,  painful ,  guarding     Shoulder Exercises: Isometric Strengthening   Extension 5X5"   External Rotation 5X5"   Internal Rotation 5X5"     Modalities   Modalities Cryotherapy;Moist Heat     Moist Heat Therapy   Number Minutes Moist Heat 15 Minutes   Moist Heat Location Knee     Cryotherapy   Number Minutes Cryotherapy 15 Minutes   Cryotherapy Location --  ribs   Type of Cryotherapy --  cold pack     Manual Therapy   Manual Therapy Passive ROM;Soft tissue mobilization   Soft tissue mobilization Left pectoralis, deltiod upper trap  Passive ROM Left shoulder all motions, limited by pain                PT Education - 09/19/16 1434    Education provided Yes   Education Details HEP   Person(s) Educated Patient   Methods Explanation;Demonstration;Verbal cues;Handout   Comprehension Verbalized understanding          PT Short Term Goals - 09/19/16 1437      PT SHORT TERM GOAL #1   Title Pt will demo 10 deg increase in GHJ AROM for improved functional use by 8/3   Baseline 90 degrees,  less ROM   Time 3   Period Weeks   Status On-going     PT SHORT TERM GOAL #2   Title Resolution of pain under L breast   Baseline only when she moves the wrong way, no longer constant   Time 3   Period Weeks   Status Partially Met           PT Long Term Goals - 08/27/16 1315      PT LONG TERM GOAL #1   Title FOTO to 41% limitation to indicate significant improvement in functional ability by 9/7   Baseline 63% limitation at eval   Time 8   Period Weeks   Status New     PT LONG TERM GOAL #2   Title DGI to  19/24 or better   Baseline 16/24 at eval, MDC 2.9   Time 8   Period Weeks   Status New     PT LONG TERM GOAL #3   Title BERG to 48/56 or better   Baseline 45/56 at eval, MDC 3.3   Time 8   Period Weeks   Status New     PT LONG TERM GOAL #4   Title Pt will be able to use her L arm for self care activies pain <=3/10   Baseline unable to use arm for most activities at eval   Time 8   Period Weeks   Status New               Plan - 09/19/16 1422    Clinical Impression Statement AROM 90 left shoulder ROM is gradually decreasing for flexion from 108 on first visit and  92 last visit. Mild discomfort with Exercises.     PT Treatment/Interventions ADLs/Self Care Home Management;Cryotherapy;Electrical Stimulation;Iontophoresis 16m/ml Dexamethasone;Functional mobility training;Stair training;Gait training;Ultrasound;Traction;Moist Heat;Therapeutic activities;Therapeutic exercise;Balance training;Neuromuscular re-education;Patient/family education;Passive range of motion;Dry needling;Taping;Vasopneumatic Device   PT Next Visit Plan GHJ ROM-passive & AAROM, standing balance as tol by knee pain.  Review table sloides,  pulleys.   Measure to zMonitor motion.    PT Home Exercise Plan scapular retraction, shoulder flexion table slides; seated HSS, hee/toe raises, sidelying GHJ ER;   Table slides.   Consulted and Agree with Plan of Care Patient      Patient will benefit from skilled therapeutic intervention in order to improve the following deficits and impairments:  Decreased range of motion, Difficulty walking, Impaired UE functional use, Increased muscle spasms, Decreased endurance, Cardiopulmonary status limiting activity, Decreased activity tolerance, Pain, Impaired flexibility, Decreased balance, Decreased mobility, Decreased strength, Postural dysfunction, Impaired sensation  Visit Diagnosis: Acute pain of left shoulder  Other abnormalities of gait and mobility     Problem  List Patient Active Problem List   Diagnosis Date Noted  . Shoulder pain, left 08/14/2016  . Aortic stenosis, moderate 08/14/2016  . Osteoporosis 11/01/2015  . Cough 09/02/2015  .  Heart valve disease 06/14/2015  . Insomnia 10/22/2013  . UTI (urinary tract infection) 08/24/2013  . Preop exam for internal medicine 01/16/2012  . Ankle pain 07/11/2011  . Adjustment disorder with mixed anxiety and depressed mood 03/28/2011  . GERD (gastroesophageal reflux disease) 12/25/2010  . Dysphagia 12/25/2010  . Coronary atherosclerosis 02/09/2010  . FEVER UNSPECIFIED 01/18/2010  . CHEST PAIN 01/18/2010  . Urinary incontinence 01/18/2010  . BRONCHITIS NOT SPECIFIED AS ACUTE OR CHRONIC 12/05/2009  . NEURALGIA, TRIGEMINAL 07/04/2009  . EAR PAIN 07/04/2009  . HYPOKALEMIA 02/17/2009  . Arthralgia 02/17/2009  . Hyperglycemia 09/02/2008  . Osteoarthritis 03/11/2008  . EDEMA 03/11/2008  . KNEE PAIN 02/04/2008  . CHEST XRAY, ABNORMAL 07/28/2007  . Dyslipidemia 03/17/2007  . Essential hypertension 03/17/2007  . UPPER RESPIRATORY INFECTION (URI) 03/17/2007  . VAGINITIS 03/17/2007    Tafari Humiston PTA 09/19/2016, 2:39 PM  [facility] [address] [place], [place], [address] Phone: [phone]   Fax:  [phone]  Name: Terri King MRN: [id number] Date of Birth: 1934/07/04   PHYSICAL THERAPY DISCHARGE SUMMARY  Visits from Start of Care: 5  Current functional level related to goals / functional outcomes: See above   Remaining deficits: See above   Education / Equipment: Anatomy of condition, POC, HEP, exercise form/rationale  Plan: Patient agrees to discharge.  Patient goals were not met. Patient is being discharged due to financial reasons.  ?????     Pt requests to end treatment due to financial concerns.  Jessica C. Hightower PT, DPT 10/01/16 11:13 AM

## 2016-09-25 ENCOUNTER — Ambulatory Visit: Payer: Medicare HMO | Admitting: Physical Therapy

## 2016-09-27 ENCOUNTER — Encounter: Payer: Medicare HMO | Admitting: Physical Therapy

## 2016-10-01 DIAGNOSIS — M25512 Pain in left shoulder: Secondary | ICD-10-CM | POA: Diagnosis not present

## 2016-10-01 DIAGNOSIS — R2689 Other abnormalities of gait and mobility: Secondary | ICD-10-CM | POA: Diagnosis not present

## 2016-10-02 ENCOUNTER — Encounter: Payer: Medicare HMO | Admitting: Physical Therapy

## 2016-10-03 ENCOUNTER — Encounter: Payer: Self-pay | Admitting: Internal Medicine

## 2016-10-04 ENCOUNTER — Encounter: Payer: Medicare HMO | Admitting: Physical Therapy

## 2016-10-04 ENCOUNTER — Encounter: Payer: Self-pay | Admitting: Internal Medicine

## 2016-10-08 ENCOUNTER — Telehealth: Payer: Self-pay | Admitting: Internal Medicine

## 2016-10-08 MED ORDER — TEMAZEPAM 30 MG PO CAPS: ORAL_CAPSULE | ORAL | 1 refills | Status: DC

## 2016-10-08 NOTE — Telephone Encounter (Signed)
Notified pt MD ok refill has been called into [facility]...lmb

## 2016-10-08 NOTE — Telephone Encounter (Signed)
Ok Rx x6 months Thx

## 2016-10-08 NOTE — Telephone Encounter (Signed)
Patient requesting temazepam to be sent to [facility]

## 2016-10-09 ENCOUNTER — Encounter: Payer: Medicare HMO | Admitting: Physical Therapy

## 2016-10-11 ENCOUNTER — Encounter: Payer: Medicare HMO | Admitting: Physical Therapy

## 2016-10-16 ENCOUNTER — Encounter: Payer: Medicare HMO | Admitting: Physical Therapy

## 2016-10-17 ENCOUNTER — Telehealth: Payer: Self-pay

## 2016-10-17 NOTE — Telephone Encounter (Signed)
Left voicemail reminding patient again that it is time for her next prolia injection---first injection was oct/2017---I have verified insurance and patient will have estimated $220 copay---can schedule at her earliest convenience, can talk with tamara if any further questions

## 2016-10-18 ENCOUNTER — Encounter: Payer: Medicare HMO | Admitting: Physical Therapy

## 2016-10-19 NOTE — Progress Notes (Signed)
,n   Cardiology Office Note   Date:  10/26/2016   ID:  [name], DOB 07-01-1934, MRN [id number]  PCP:  Tresa Garter, MD  Cardiologist:   Dietrich Pates, MD   F/U of aortic stenosis     History of Present Illness: Terri King is a 82 y.o. female with a history of SOB  On and off since may  Followed by A [name]  Has a history of mod AS  Echo in 2017 Peak and mean gradients 56 and33 mm Hg   In 2012 mean gradient was 10   Patient had knee surgery   Also fell and broke ribs in 2017 She says that at that time she was walking from kitchen to den. The  Next thing she knew she was on floor  Doesn't remember what happened  Woke up on back on floor  NO severe dizzy spells since  No fainting    Pt has had some spells of dyspnea with exertion  Had to stop  A couple when out of town  Has cut back on walking  To avoid   Current Meds  Medication Sig  . ALPRAZolam (XANAX) 0.25 MG tablet Take 0.5-1 tablets (0.125-0.25 mg total) by mouth 2 (two) times daily as needed for anxiety.  Marland Kitchen amLODipine (NORVASC) 5 MG tablet TAKE 1 TABLET EVERY DAY  . cholecalciferol (VITAMIN D) 400 UNITS TABS Take 400 Units by mouth daily.  Marland Kitchen denosumab (PROLIA) 60 MG/ML SOLN injection Inject 60 mg into the skin every 6 (six) months. Administer in upper arm, thigh, or abdomen  . escitalopram (LEXAPRO) 10 MG tablet TAKE 1 TABLET EVERY DAY  . fluticasone furoate-vilanterol (BREO ELLIPTA) 100-25 MCG/INH AEPB Inhale 1 puff into the lungs daily.  Marland Kitchen gabapentin (NEURONTIN) 100 MG capsule TAKE 1 CAPSULE THREE TIMES DAILY  . ibuprofen (ADVIL,MOTRIN) 600 MG tablet TAKE 1 TABLET  DAILY AS NEEDED FOR MODERATE PAIN  . Krill Oil (MAXIMUM RED KRILL) 300 MG CAPS 1 po qd  . metoprolol succinate (TOPROL-XL) 50 MG 24 hr tablet TAKE 1 TABLET AT BEDTIME  . Multiple Vitamins-Minerals (EYE VITAMINS PO) Take by mouth as directed.  . Multiple Vitamins-Minerals (MULTIPLE VITAMINS/WOMENS PO) Take by mouth as directed.  . potassium  chloride SA (K-DUR,KLOR-CON) 20 MEQ tablet TAKE 1 TABLET EVERY DAY  . ranitidine (ZANTAC) 150 MG tablet TAKE 1 TABLET TWICE DAILY  . rosuvastatin (CRESTOR) 5 MG tablet TAKE 1 TABLET EVERY DAY  . temazepam (RESTORIL) 30 MG capsule TAKE 1 CAPSULE AT BEDTIME AS NEEDED  FOR  SLEEP  . triamterene-hydrochlorothiazide (MAXZIDE-25) 37.5-25 MG tablet TAKE 1 TABLET EVERY DAY  . vitamin B-12 (CYANOCOBALAMIN) 100 MCG tablet Take 100 mcg by mouth daily.  . Vitamin E 400 units TABS Take by mouth as directed.     Allergies:   Codeine; Crestor [rosuvastatin]; Dexilant [dexlansoprazole]; Ibuprofen; Simvastatin; Tylenol [acetaminophen]; and Iodine   Past Medical History:  Diagnosis Date  . CAD (coronary artery disease)    DR Tenny Craw; by notes normal cath in 1986, normal stress 2013  . Family history of anesthesia complication    NIENCE had sensitivity  . History of pneumonia 2008  . HTN (hypertension)    dr Posey Rea  . Hyperlipemia   . MI (myocardial infarction) (HCC)    DR Tenny Craw  . Osteoarthritis   . Shortness of breath     Past Surgical History:  Procedure Laterality Date  . JOINT REPLACEMENT  12/13   L TKR   .  KNEE ARTHROSCOPY     left  . PARTIAL HYSTERECTOMY    . TONSILLECTOMY    . TOTAL KNEE ARTHROPLASTY  01/23/2012   Procedure: TOTAL KNEE ARTHROPLASTY;  Surgeon: Loreta Aveaniel F Murphy, MD;  [facility];  Service: Orthopedics;  Laterality: Left;  . TOTAL KNEE ARTHROPLASTY  12/'05/2011   left knee     Social History:  The patient  reports that she has never smoked. She has never used smokeless tobacco. She reports that she does not drink alcohol or use drugs.   Family History:  The patient's family history includes Glaucoma in her father; Hypertension in her mother and unknown relative.    ROS:  Please see the history of present illness. All other systems are reviewed and  Negative to the above problem except as noted.    PHYSICAL EXAM: VS:  BP 130/60   Pulse 65   Ht 5\' 4"  (1.626 m)    Wt 186 lb (84.4 kg)   SpO2 96%   BMI 31.93 kg/m   GEN: Obese 82 yo , in no acute distress  HEENT: normal  Neck: no JVD, carotid bruits, or masses Cardiac: RRR;  Great III/VI later peaking murmur with decreased S2  rubs, or gallops,no edema  Respiratory:  clear to auscultation bilaterally, normal work of breathing GI: soft, nontender, nondistended, + BS  No hepatomegaly  MS: no deformity Moving all extremities   Skin: warm and dry, no rash Neuro:  Strength and sensation are intact Psych: euthymic mood, full affect   EKG:  EKG is not ordered today.   Lipid Panel    Component Value Date/Time   CHOL 181 12/23/2014 1046   TRIG 179.0 (H) 12/23/2014 1046   HDL 57.00 12/23/2014 1046   CHOLHDL 3 12/23/2014 1046   VLDL 35.8 12/23/2014 1046   LDLCALC 89 12/23/2014 1046   LDLDIRECT 186.1 03/24/2013 0923      Wt Readings from Last 3 Encounters:  10/26/16 186 lb (84.4 kg)  08/14/16 186 lb (84.4 kg)  05/10/16 185 lb 8 oz (84.1 kg)      ASSESSMENT AND PLAN: 1  AOrtic stenosis  Concerning that it is worse  WIll set up for echo to reevaluate  2   ? Syncope  Pt had spell over 1 year ago  ED got different history She denies falling from step latter  No prodrome  Worrisome  No recurrence  No dizzienss  Follow  3   HTN  Adequate control   F?U tentative in 6 months  WIll be in touch with pt aabout echo results      Current medicines are reviewed at length with the patient today.  The patient does not have concerns regarding medicines.  Signed, Dietrich PatesPaula Blu Mcglaun, MD  10/26/2016 9:19 AM    [facility] [address], [place], [place]  [address] Phone: [phone]; Fax: [phone]

## 2016-10-26 ENCOUNTER — Ambulatory Visit (INDEPENDENT_AMBULATORY_CARE_PROVIDER_SITE_OTHER): Payer: Medicare HMO | Admitting: Internal Medicine

## 2016-10-26 ENCOUNTER — Encounter (INDEPENDENT_AMBULATORY_CARE_PROVIDER_SITE_OTHER): Payer: Self-pay

## 2016-10-26 ENCOUNTER — Encounter: Payer: Self-pay | Admitting: Internal Medicine

## 2016-10-26 VITALS — BP 130/60 | HR 65 | Ht 64.0 in | Wt 186.0 lb

## 2016-10-26 DIAGNOSIS — I350 Nonrheumatic aortic (valve) stenosis: Secondary | ICD-10-CM | POA: Diagnosis not present

## 2016-10-26 DIAGNOSIS — I10 Essential (primary) hypertension: Secondary | ICD-10-CM

## 2016-10-26 NOTE — Patient Instructions (Signed)
Your physician recommends that you continue on your current medications as directed. Please refer to the Current Medication list given to you today.  Your physician has requested that you have an echocardiogram. Echocardiography is a painless test that uses sound waves to create images of your heart. It provides your doctor with information about the size and shape of your heart and how well your heart's chambers and valves are working. This procedure takes approximately one hour. There are no restrictions for this procedure.  Your physician wants you to follow-up in: 6 months with Dr. Ross.  You will receive a reminder letter in the mail two months in advance. If you don't receive a letter, please call our office to schedule the follow-up appointment.  

## 2016-11-02 ENCOUNTER — Other Ambulatory Visit (HOSPITAL_COMMUNITY): Payer: Medicare HMO

## 2016-11-09 ENCOUNTER — Other Ambulatory Visit: Payer: Self-pay

## 2016-11-09 ENCOUNTER — Ambulatory Visit (HOSPITAL_COMMUNITY): Payer: Medicare HMO | Attending: Cardiology

## 2016-11-09 DIAGNOSIS — I420 Dilated cardiomyopathy: Secondary | ICD-10-CM | POA: Diagnosis not present

## 2016-11-09 DIAGNOSIS — I350 Nonrheumatic aortic (valve) stenosis: Secondary | ICD-10-CM | POA: Diagnosis not present

## 2016-11-09 DIAGNOSIS — I060 Rheumatic aortic stenosis: Secondary | ICD-10-CM | POA: Insufficient documentation

## 2016-11-09 DIAGNOSIS — I5030 Unspecified diastolic (congestive) heart failure: Secondary | ICD-10-CM | POA: Insufficient documentation

## 2016-11-12 ENCOUNTER — Other Ambulatory Visit (INDEPENDENT_AMBULATORY_CARE_PROVIDER_SITE_OTHER): Payer: Medicare HMO

## 2016-11-12 ENCOUNTER — Encounter: Payer: Self-pay | Admitting: Internal Medicine

## 2016-11-12 ENCOUNTER — Ambulatory Visit (INDEPENDENT_AMBULATORY_CARE_PROVIDER_SITE_OTHER): Payer: Medicare HMO | Admitting: Internal Medicine

## 2016-11-12 VITALS — BP 126/78 | HR 70 | Temp 98.7°F | Ht 64.0 in | Wt 187.0 lb

## 2016-11-12 DIAGNOSIS — M150 Primary generalized (osteo)arthritis: Secondary | ICD-10-CM

## 2016-11-12 DIAGNOSIS — I10 Essential (primary) hypertension: Secondary | ICD-10-CM

## 2016-11-12 DIAGNOSIS — I350 Nonrheumatic aortic (valve) stenosis: Secondary | ICD-10-CM

## 2016-11-12 DIAGNOSIS — M25512 Pain in left shoulder: Secondary | ICD-10-CM | POA: Diagnosis not present

## 2016-11-12 DIAGNOSIS — I2510 Atherosclerotic heart disease of native coronary artery without angina pectoris: Secondary | ICD-10-CM | POA: Diagnosis not present

## 2016-11-12 DIAGNOSIS — G8929 Other chronic pain: Secondary | ICD-10-CM | POA: Diagnosis not present

## 2016-11-12 DIAGNOSIS — M159 Polyosteoarthritis, unspecified: Secondary | ICD-10-CM

## 2016-11-12 LAB — BASIC METABOLIC PANEL
BUN: 11 mg/dL (ref 6–23)
CO2: 28 mEq/L (ref 19–32)
Calcium: 10.1 mg/dL (ref 8.4–10.5)
Chloride: 102 mEq/L (ref 96–112)
Creatinine, Ser: 0.95 mg/dL (ref 0.40–1.20)
GFR: 72.66 mL/min (ref >60.00)
GLUCOSE: 125 mg/dL — AB (ref 70–99)
POTASSIUM: 3.3 meq/L — AB (ref 3.5–5.1)
SODIUM: 140 meq/L (ref 135–145)

## 2016-11-12 MED ORDER — METHYLPREDNISOLONE ACETATE 80 MG/ML IJ SUSP
80.0000 mg | Freq: Once | INTRAMUSCULAR | Status: AC
  Administered 2016-11-12: 80 mg via INTRA_ARTICULAR

## 2016-11-12 NOTE — Assessment & Plan Note (Signed)
Will inject - see procedure 

## 2016-11-12 NOTE — Assessment & Plan Note (Signed)
Dr Tenny Craw 9/18 ECHO - no change: moderate AS Cont current Rx

## 2016-11-12 NOTE — Assessment & Plan Note (Signed)
Vit D 

## 2016-11-12 NOTE — Assessment & Plan Note (Signed)
Cont w/Crestor and ASA, Norvasc

## 2016-11-12 NOTE — Patient Instructions (Addendum)
MC well w/Jill    Postprocedure instructions :    A Band-Aid should be left on for 12 hours. Injection therapy is not a cure itself. It is used in conjunction with other modalities. You can use nonsteroidal anti-inflammatories like ibuprofen , hot and cold compresses. Rest is recommended in the next 24 hours. You need to report immediately  if fever, chills or any signs of infection develop.

## 2016-11-12 NOTE — Assessment & Plan Note (Signed)
Norvasc

## 2016-11-12 NOTE — Progress Notes (Signed)
Subjective:  Patient ID: Terri King, female    DOB: 06/15/1934  Age: 82 y.o. MRN: [id number]  CC: No chief complaint on file.   HPI Terri King presents for OA, anxiety, HTN C/o L shoulder pain  - fell in March 2018  Outpatient Medications Prior to Visit  Medication Sig Dispense Refill  . ALPRAZolam (XANAX) 0.25 MG tablet Take 0.5-1 tablets (0.125-0.25 mg total) by mouth 2 (two) times daily as needed for anxiety. 60 tablet 3  . amLODipine (NORVASC) 5 MG tablet TAKE 1 TABLET EVERY DAY 90 tablet 2  . cholecalciferol (VITAMIN D) 400 UNITS TABS Take 400 Units by mouth daily.    Marland Kitchen denosumab (PROLIA) 60 MG/ML SOLN injection Inject 60 mg into the skin every 6 (six) months. Administer in upper arm, thigh, or abdomen 1 mL 0  . escitalopram (LEXAPRO) 10 MG tablet TAKE 1 TABLET EVERY DAY 90 tablet 1  . fluticasone furoate-vilanterol (BREO ELLIPTA) 100-25 MCG/INH AEPB Inhale 1 puff into the lungs daily. 1 each 5  . gabapentin (NEURONTIN) 100 MG capsule TAKE 1 CAPSULE THREE TIMES DAILY 270 capsule 3  . ibuprofen (ADVIL,MOTRIN) 600 MG tablet TAKE 1 TABLET  DAILY AS NEEDED FOR MODERATE PAIN 90 tablet 0  . Krill Oil (MAXIMUM RED KRILL) 300 MG CAPS 1 po qd 100 capsule 3  . metoprolol succinate (TOPROL-XL) 50 MG 24 hr tablet TAKE 1 TABLET AT BEDTIME 90 tablet 2  . Multiple Vitamins-Minerals (EYE VITAMINS PO) Take by mouth as directed.    . Multiple Vitamins-Minerals (MULTIPLE VITAMINS/WOMENS PO) Take by mouth as directed.    . potassium chloride SA (K-DUR,KLOR-CON) 20 MEQ tablet TAKE 1 TABLET EVERY DAY 90 tablet 2  . ranitidine (ZANTAC) 150 MG tablet TAKE 1 TABLET TWICE DAILY 180 tablet 1  . rosuvastatin (CRESTOR) 5 MG tablet TAKE 1 TABLET EVERY DAY 90 tablet 1  . temazepam (RESTORIL) 30 MG capsule TAKE 1 CAPSULE AT BEDTIME AS NEEDED  FOR  SLEEP 90 capsule 1  . triamterene-hydrochlorothiazide (MAXZIDE-25) 37.5-25 MG tablet TAKE 1 TABLET EVERY DAY 90 tablet 1  . vitamin B-12 (CYANOCOBALAMIN) 100  MCG tablet Take 100 mcg by mouth daily.    . Vitamin E 400 units TABS Take by mouth as directed.     No facility-administered medications prior to visit.     ROS Review of Systems  Constitutional: Negative for activity change, appetite change, chills, fatigue and unexpected weight change.  HENT: Negative for congestion, mouth sores and sinus pressure.   Eyes: Negative for visual disturbance.  Respiratory: Negative for cough and chest tightness.   Gastrointestinal: Negative for abdominal pain and nausea.  Genitourinary: Negative for difficulty urinating, frequency and vaginal pain.  Musculoskeletal: Positive for arthralgias and gait problem. Negative for back pain.  Skin: Negative for pallor and rash.  Neurological: Negative for dizziness, tremors, weakness, numbness and headaches.  Psychiatric/Behavioral: Negative for confusion and sleep disturbance.    Objective:  BP 126/78 (BP Location: Left Arm, Patient Position: Sitting, Cuff Size: Large)   Pulse 70   Temp 98.7 F (37.1 C) (Oral)   Ht  (1.626 m)   Wt 187 lb (84.8 kg)   SpO2 99%   BMI 32.10 kg/m   BP Readings from Last 3 Encounters:  11/12/16 126/78  10/26/16 130/60  08/14/16 136/72    Wt Readings from Last 3 Encounters:  11/12/16 187 lb (84.8 kg)  10/26/16 186 lb (84.4 kg)  08/14/16 186 lb (84.4 kg)  Physical Exam  Constitutional: She appears well-developed. No distress.  HENT:  Head: Normocephalic.  Right Ear: External ear normal.  Left Ear: External ear normal.  Nose: Nose normal.  Mouth/Throat: Oropharynx is clear and moist.  Eyes: Pupils are equal, round, and reactive to light. Conjunctivae are normal. Right eye exhibits no discharge. Left eye exhibits no discharge.  Neck: Normal range of motion. Neck supple. No JVD present. No tracheal deviation present. No thyromegaly present.  Cardiovascular: Normal rate and regular rhythm.   Murmur heard. Pulmonary/Chest: No stridor. No respiratory distress.  She has no wheezes.  Abdominal: Soft. Bowel sounds are normal. She exhibits no distension and no mass. There is no tenderness. There is no rebound and no guarding.  Musculoskeletal: She exhibits tenderness. She exhibits no edema.  Lymphadenopathy:    She has no cervical adenopathy.  Neurological: She displays normal reflexes. No cranial nerve deficit. She exhibits normal muscle tone. Coordination normal.  Skin: No rash noted. No erythema.  Psychiatric: She has a normal mood and affect. Her behavior is normal. Judgment and thought content normal.  L shoulder is tender   Procedure :Joint Injection, L  shoulder   Indication:  Subacromial bursitis with refractory  chronic pain.   Risks including unsuccessful procedure , bleeding, infection, bruising, skin atrophy, "steroid flare-up" and others were explained to the patient in detail as well as the benefits. Informed consent was obtained and signed.   Tthe patient was placed in a comfortable position. Lateral approach was used. Skin was prepped with Betadine and alcohol  and anesthetized with a cooling spray. Then, a 5 cc syringe with a 2 inch long 24-gauge needle was used for a joint injection.. The needle was advanced  Into the subacromial space.The bursa was injected with 3 mL of 2% lidocaine and 80 mg of Depo-Medrol .  Band-Aid was applied.   Tolerated well. Complications: None. Good pain relief following the procedure.      Lab Results  Component Value Date   WBC 8.1 04/29/2015   HGB 13.5 04/29/2015   HCT 39.7 04/29/2015   PLT 235 04/29/2015   GLUCOSE 113 (H) 05/10/2016   CHOL 181 12/23/2014   TRIG 179.0 (H) 12/23/2014   HDL 57.00 12/23/2014   LDLDIRECT 186.1 03/24/2013   LDLCALC 89 12/23/2014   ALT 11 11/01/2015   AST 20 11/01/2015   NA 139 05/10/2016   K 3.4 (L) 05/10/2016   CL 105 05/10/2016   CREATININE 0.95 05/10/2016   BUN 12 05/10/2016   CO2 28 05/10/2016   TSH 2.10 11/01/2015   INR 1.44 01/25/2012   HGBA1C 6.1  05/10/2016    Dg Bone Density  Result Date: 10/05/2015 Date of study: 10/03/2015 Exam: DUAL X-RAY ABSORPTIOMETRY (DXA) FOR BONE MINERAL DENSITY (BMD) Instrument: [employer] Therapist, art Provider: PCP Indication: screening for osteoporosis Comparison: none (please note that it is not possible to compare data from different instruments) Clinical data: Pt is a 82 y.o. female with history of rib fracture. On vitamin D supplements. Results:  Lumbar spine (L2, L4) Femoral neck (FN) Change in BMD from previous DXA test (%) -2.9  RFN:-2.6 LFN:-1.8  (*) statistically significant Assessment: Patient has OSTEOPOROSIS according to the [facility] classification for osteoporosis (see below). Fracture risk: high Comments: the technical quality of the study is good, however, the spine is scoliotic and arthritic. Calcium accumulation in arthritic sites can confound the results of the bone density scan. L1 and L3 vertebrae had to be excluded from analysis. Evaluation for  secondary causes should be considered if clinically indicated. Recommend optimizing calcium (1200 mg/day) and vitamin D (800 IU/day). Followup: Repeat BMD is appropriate after 1-2 years WHO criteria for diagnosis of osteoporosis in postmenopausal women and in men 84 y/o or older: - normal: T-score -1.0 to + 1.0 - osteopenia/low bone density: T-score between -2.5 and -1.0 - osteoporosis: T-score below -2.5 - severe osteoporosis: T-score below -2.5 with history of fragility fracture Note: although not part of the WHO classification, the presence of a fragility fracture, regardless of the T-score, should be considered diagnostic of osteoporosis, provided other causes for the fracture have been excluded. Treatment: The National Osteoporosis Foundation recommends that treatment be considered in postmenopausal women and men age 1 or older with: 1. Hip or vertebral (clinical or morphometric) fracture 2. T-score of - 2.5 or lower at the spine or hip 3. 10-year fracture  probability by FRAX of at least 20% for a major osteoporotic fracture and 3% for a hip fracture Carlus Pavlov, MD [facility]    Assessment & Plan:   There are no diagnoses linked to this encounter. I am having Terri King maintain her vitamin B-12, cholecalciferol, MAXIMUM RED KRILL, fluticasone furoate-vilanterol, gabapentin, denosumab, potassium chloride SA, Multiple Vitamins-Minerals (MULTIPLE VITAMINS/WOMENS PO), Multiple Vitamins-Minerals (EYE VITAMINS PO), Vitamin E, amLODipine, ranitidine, escitalopram, triamterene-hydrochlorothiazide, rosuvastatin, metoprolol succinate, ALPRAZolam, ibuprofen, and temazepam.  No orders of the defined types were placed in this encounter.    Follow-up: No Follow-up on file.  Sonda Primes, MD

## 2016-11-12 NOTE — Addendum Note (Signed)
Addended by: Scarlett Presto on: 11/12/2016 10:14 AM   Modules accepted: Orders

## 2016-11-13 ENCOUNTER — Other Ambulatory Visit: Payer: Self-pay | Admitting: Internal Medicine

## 2016-11-20 ENCOUNTER — Other Ambulatory Visit: Payer: Self-pay | Admitting: Internal Medicine

## 2016-12-07 ENCOUNTER — Telehealth: Payer: Self-pay | Admitting: Internal Medicine

## 2016-12-07 NOTE — Telephone Encounter (Signed)
Pt called stating no one called her letting her know if she needs to increase the potassium from her blood work on 9/24,please call back in regard

## 2016-12-07 NOTE — Telephone Encounter (Signed)
Spoke with pt about her lab results.

## 2017-02-08 ENCOUNTER — Other Ambulatory Visit (INDEPENDENT_AMBULATORY_CARE_PROVIDER_SITE_OTHER): Payer: Medicare HMO

## 2017-02-08 ENCOUNTER — Ambulatory Visit: Payer: Medicare HMO | Admitting: Internal Medicine

## 2017-02-08 ENCOUNTER — Encounter: Payer: Self-pay | Admitting: Internal Medicine

## 2017-02-08 VITALS — BP 150/70 | HR 80 | Temp 98.6°F | Ht 64.0 in | Wt 190.2 lb

## 2017-02-08 DIAGNOSIS — Z0000 Encounter for general adult medical examination without abnormal findings: Secondary | ICD-10-CM

## 2017-02-08 DIAGNOSIS — I350 Nonrheumatic aortic (valve) stenosis: Secondary | ICD-10-CM | POA: Diagnosis not present

## 2017-02-08 DIAGNOSIS — M150 Primary generalized (osteo)arthritis: Secondary | ICD-10-CM | POA: Diagnosis not present

## 2017-02-08 DIAGNOSIS — F5101 Primary insomnia: Secondary | ICD-10-CM

## 2017-02-08 DIAGNOSIS — I10 Essential (primary) hypertension: Secondary | ICD-10-CM

## 2017-02-08 DIAGNOSIS — F4323 Adjustment disorder with mixed anxiety and depressed mood: Secondary | ICD-10-CM | POA: Diagnosis not present

## 2017-02-08 LAB — CBC WITH DIFFERENTIAL/PLATELET
BASOS ABS: 0.1 10*3/uL (ref 0.0–0.1)
Basophils Relative: 1.1 % (ref 0.0–3.0)
EOS ABS: 0.4 10*3/uL (ref 0.0–0.7)
Eosinophils Relative: 3.9 % (ref 0.0–5.0)
HCT: 41.7 % (ref 36.0–46.0)
Hemoglobin: 14 g/dL (ref 12.0–15.0)
LYMPHS ABS: 2.2 10*3/uL (ref 0.7–4.0)
Lymphocytes Relative: 23.1 % (ref 12.0–46.0)
MCHC: 33.7 g/dL (ref 30.0–36.0)
MCV: 93.4 fl (ref 78.0–100.0)
Monocytes Absolute: 1 10*3/uL (ref 0.1–1.0)
Monocytes Relative: 10.8 % (ref 3.0–12.0)
NEUTROS ABS: 5.9 10*3/uL (ref 1.4–7.7)
NEUTROS PCT: 61.1 % (ref 43.0–77.0)
PLATELETS: 270 10*3/uL (ref 150.0–400.0)
RBC: 4.46 Mil/uL (ref 3.87–5.11)
RDW: 13.4 % (ref 11.5–15.5)
WBC: 9.7 10*3/uL (ref 4.0–10.5)

## 2017-02-08 LAB — LIPID PANEL
CHOL/HDL RATIO: 4
Cholesterol: 217 mg/dL — ABNORMAL HIGH (ref 0–200)
HDL: 61 mg/dL (ref >39.00)
LDL CALC: 126 mg/dL — AB (ref 0–99)
NonHDL: 156.1
TRIGLYCERIDES: 150 mg/dL — AB (ref 0.0–149.0)
VLDL: 30 mg/dL (ref 0.0–40.0)

## 2017-02-08 LAB — BASIC METABOLIC PANEL
BUN: 9 mg/dL (ref 6–23)
CHLORIDE: 102 meq/L (ref 96–112)
CO2: 27 meq/L (ref 19–32)
Calcium: 9.6 mg/dL (ref 8.4–10.5)
Creatinine, Ser: 0.89 mg/dL (ref 0.40–1.20)
GFR: 78.3 mL/min (ref >60.00)
Glucose, Bld: 98 mg/dL (ref 70–99)
Potassium: 3.7 mEq/L (ref 3.5–5.1)
SODIUM: 136 meq/L (ref 135–145)

## 2017-02-08 LAB — HEPATIC FUNCTION PANEL
ALBUMIN: 4.2 g/dL (ref 3.5–5.2)
ALT: 11 U/L (ref 0–35)
AST: 20 U/L (ref 0–37)
Alkaline Phosphatase: 78 U/L (ref 39–117)
Bilirubin, Direct: 0.1 mg/dL (ref 0.0–0.3)
Total Bilirubin: 0.5 mg/dL (ref 0.2–1.2)
Total Protein: 7.7 g/dL (ref 6.0–8.3)

## 2017-02-08 LAB — URINALYSIS
BILIRUBIN URINE: NEGATIVE
Hgb urine dipstick: NEGATIVE
KETONES UR: NEGATIVE
LEUKOCYTES UA: NEGATIVE
Nitrite: NEGATIVE
PH: 7 (ref 5.0–8.0)
Specific Gravity, Urine: 1.015 (ref 1.000–1.030)
Total Protein, Urine: NEGATIVE
Urine Glucose: NEGATIVE
Urobilinogen, UA: 0.2 (ref 0.0–1.0)

## 2017-02-08 LAB — TSH: TSH: 1.62 u[IU]/mL (ref 0.35–4.50)

## 2017-02-08 MED ORDER — GABAPENTIN 100 MG PO CAPS: ORAL_CAPSULE | ORAL | 3 refills | Status: DC

## 2017-02-08 MED ORDER — TEMAZEPAM 30 MG PO CAPS: ORAL_CAPSULE | ORAL | 1 refills | Status: DC

## 2017-02-08 MED ORDER — ALPRAZOLAM 0.25 MG PO TABS: 0.1250 mg | ORAL_TABLET | Freq: Two times a day (BID) | ORAL | 1 refills | Status: DC | PRN

## 2017-02-08 NOTE — Assessment & Plan Note (Signed)
Lexapro Alprazolam prn - not w/Temazepam  Potential benefits of a long term benzodiazepines  use as well as potential risks  and complications were explained to the patient and were aknowledged.

## 2017-02-08 NOTE — Progress Notes (Signed)
Subjective:  Patient ID: Rennis Goldenarol L Semmel, female    DOB: 30-Oct-1934  Age: 82 y.o. MRN: [id number]  CC: No chief complaint on file.   HPI Rennis GoldenCarol L Saville presents for a well exam f/u HTN, OA, LBP, insomnia  Outpatient Medications Prior to Visit  Medication Sig Dispense Refill  . ALPRAZolam (XANAX) 0.25 MG tablet Take 0.5-1 tablets (0.125-0.25 mg total) by mouth 2 (two) times daily as needed for anxiety. 60 tablet 3  . amLODipine (NORVASC) 5 MG tablet TAKE 1 TABLET EVERY DAY 90 tablet 1  . cholecalciferol (VITAMIN D) 400 UNITS TABS Take 400 Units by mouth daily.    Marland Kitchen. denosumab (PROLIA) 60 MG/ML SOLN injection Inject 60 mg into the skin every 6 (six) months. Administer in upper arm, thigh, or abdomen 1 mL 0  . escitalopram (LEXAPRO) 10 MG tablet TAKE 1 TABLET EVERY DAY 90 tablet 1  . fluticasone furoate-vilanterol (BREO ELLIPTA) 100-25 MCG/INH AEPB Inhale 1 puff into the lungs daily. 1 each 5  . gabapentin (NEURONTIN) 100 MG capsule TAKE 1 CAPSULE THREE TIMES DAILY 270 capsule 3  . ibuprofen (ADVIL,MOTRIN) 600 MG tablet TAKE 1 TABLET DAILY AS NEEDED FOR MODERATE PAIN 90 tablet 1  . Krill Oil (MAXIMUM RED KRILL) 300 MG CAPS 1 po qd 100 capsule 3  . metoprolol succinate (TOPROL-XL) 50 MG 24 hr tablet TAKE 1 TABLET AT BEDTIME 90 tablet 2  . Multiple Vitamins-Minerals (EYE VITAMINS PO) Take by mouth as directed.    . Multiple Vitamins-Minerals (MULTIPLE VITAMINS/WOMENS PO) Take by mouth as directed.    . potassium chloride SA (K-DUR,KLOR-CON) 20 MEQ tablet TAKE 1 TABLET EVERY DAY 90 tablet 2  . ranitidine (ZANTAC) 150 MG tablet TAKE 1 TABLET TWICE DAILY 180 tablet 1  . rosuvastatin (CRESTOR) 5 MG tablet TAKE 1 TABLET EVERY DAY 90 tablet 1  . temazepam (RESTORIL) 30 MG capsule TAKE 1 CAPSULE AT BEDTIME AS NEEDED  FOR  SLEEP 90 capsule 1  . triamterene-hydrochlorothiazide (MAXZIDE-25) 37.5-25 MG tablet TAKE 1 TABLET EVERY DAY 90 tablet 1  . vitamin B-12 (CYANOCOBALAMIN) 100 MCG tablet Take 100  mcg by mouth daily.    . Vitamin E 400 units TABS Take by mouth as directed.     No facility-administered medications prior to visit.     ROS Review of Systems  Constitutional: Negative for activity change, appetite change, chills, fatigue and unexpected weight change.  HENT: Negative for congestion, mouth sores and sinus pressure.   Eyes: Negative for visual disturbance.  Respiratory: Negative for cough and chest tightness.   Gastrointestinal: Negative for abdominal pain and nausea.  Genitourinary: Negative for difficulty urinating, frequency and vaginal pain.  Musculoskeletal: Positive for arthralgias and back pain. Negative for gait problem.  Skin: Negative for pallor and rash.  Neurological: Negative for dizziness, tremors, weakness, numbness and headaches.  Psychiatric/Behavioral: Positive for sleep disturbance. Negative for confusion and suicidal ideas. The patient is not nervous/anxious.     Objective:  BP (!) 150/70 (BP Location: Left Arm, Patient Position: Sitting, Cuff Size: Normal)   Pulse 80   Temp 98.6 F (37 C) (Oral)   Ht 5\' 4"  (1.626 m)   Wt 190 lb 4 oz (86.3 kg)   SpO2 99%   BMI 32.66 kg/m   BP Readings from Last 3 Encounters:  02/08/17 (!) 150/70  11/12/16 126/78  10/26/16 130/60    Wt Readings from Last 3 Encounters:  02/08/17 190 lb 4 oz (86.3 kg)  11/12/16 187 lb (84.8  kg)  10/26/16 186 lb (84.4 kg)    Physical Exam  Constitutional: She appears well-developed. No distress.  HENT:  Head: Normocephalic.  Right Ear: External ear normal.  Left Ear: External ear normal.  Nose: Nose normal.  Mouth/Throat: Oropharynx is clear and moist.  Eyes: Conjunctivae are normal. Pupils are equal, round, and reactive to light. Right eye exhibits no discharge. Left eye exhibits no discharge.  Neck: Normal range of motion. Neck supple. No JVD present. No tracheal deviation present. No thyromegaly present.  Cardiovascular: Normal rate, regular rhythm and normal  heart sounds.  Pulmonary/Chest: No stridor. No respiratory distress. She has no wheezes.  Abdominal: Soft. Bowel sounds are normal. She exhibits no distension and no mass. There is no tenderness. There is no rebound and no guarding.  Musculoskeletal: She exhibits tenderness. She exhibits no edema.  Lymphadenopathy:    She has no cervical adenopathy.  Neurological: She displays normal reflexes. No cranial nerve deficit. She exhibits normal muscle tone. Coordination abnormal.  Skin: No rash noted. No erythema.  Psychiatric: She has a normal mood and affect. Her behavior is normal. Judgment and thought content normal.  knees w/OA  Lab Results  Component Value Date   WBC 8.1 04/29/2015   HGB 13.5 04/29/2015   HCT 39.7 04/29/2015   PLT 235 04/29/2015   GLUCOSE 125 (H) 11/12/2016   CHOL 181 12/23/2014   TRIG 179.0 (H) 12/23/2014   HDL 57.00 12/23/2014   LDLDIRECT 186.1 03/24/2013   LDLCALC 89 12/23/2014   ALT 11 11/01/2015   AST 20 11/01/2015   NA 140 11/12/2016   K 3.3 (L) 11/12/2016   CL 102 11/12/2016   CREATININE 0.95 11/12/2016   BUN 11 11/12/2016   CO2 28 11/12/2016   TSH 2.10 11/01/2015   INR 1.44 01/25/2012   HGBA1C 6.1 05/10/2016    Dg Bone Density  Result Date: 10/05/2015 Date of study: 10/03/2015 Exam: DUAL X-RAY ABSORPTIOMETRY (DXA) FOR BONE MINERAL DENSITY (BMD) Instrument: [employer] Therapist, art Provider: PCP Indication: screening for osteoporosis Comparison: none (please note that it is not possible to compare data from different instruments) Clinical data: Pt is a 82 y.o. female with history of rib fracture. On vitamin D supplements. Results:  Lumbar spine (L2, L4) Femoral neck (FN) Change in BMD from previous DXA test (%) -2.9  RFN:-2.6 LFN:-1.8  (*) statistically significant Assessment: Patient has OSTEOPOROSIS according to the [facility] classification for osteoporosis (see below). Fracture risk: high Comments: the technical quality of the study is good, however,  the spine is scoliotic and arthritic. Calcium accumulation in arthritic sites can confound the results of the bone density scan. L1 and L3 vertebrae had to be excluded from analysis. Evaluation for secondary causes should be considered if clinically indicated. Recommend optimizing calcium (1200 mg/day) and vitamin D (800 IU/day). Followup: Repeat BMD is appropriate after 1-2 years WHO criteria for diagnosis of osteoporosis in postmenopausal women and in men 48 y/o or older: - normal: T-score -1.0 to + 1.0 - osteopenia/low bone density: T-score between -2.5 and -1.0 - osteoporosis: T-score below -2.5 - severe osteoporosis: T-score below -2.5 with history of fragility fracture Note: although not part of the WHO classification, the presence of a fragility fracture, regardless of the T-score, should be considered diagnostic of osteoporosis, provided other causes for the fracture have been excluded. Treatment: The National Osteoporosis Foundation recommends that treatment be considered in postmenopausal women and men age 50 or older with: 1. Hip or vertebral (clinical or morphometric) fracture  2. T-score of - 2.5 or lower at the spine or hip 3. 10-year fracture probability by FRAX of at least 20% for a major osteoporotic fracture and 3% for a hip fracture Carlus Pavlovristina Gherghe, MD [facility]    Assessment & Plan:   There are no diagnoses linked to this encounter. I am having Aluel L. Finkle maintain her vitamin B-12, cholecalciferol, MAXIMUM RED KRILL, fluticasone furoate-vilanterol, gabapentin, denosumab, potassium chloride SA, Multiple Vitamins-Minerals (MULTIPLE VITAMINS/WOMENS PO), Multiple Vitamins-Minerals (EYE VITAMINS PO), Vitamin E, metoprolol succinate, ALPRAZolam, temazepam, ranitidine, rosuvastatin, triamterene-hydrochlorothiazide, escitalopram, amLODipine, and ibuprofen.  No orders of the defined types were placed in this encounter.    Follow-up: No Follow-up on file.  Sonda PrimesAlex Kiren Mcisaac,  MD

## 2017-02-08 NOTE — Assessment & Plan Note (Signed)
Norvasc, ASA 81 

## 2017-02-08 NOTE — Patient Instructions (Addendum)
[facility] best financially for eye exams.  Continue doing brain stimulating activities (puzzles, reading, adult coloring books, staying active) to keep memory sharp.   Continue to eat heart healthy diet (full of fruits, vegetables, whole grains, lean protein, water--limit salt, fat, and sugar intake) and increase physical activity as tolerated.   Terri King , Thank you for taking time to come for your Medicare Wellness Visit. I appreciate your ongoing commitment to your health goals. Please review the following plan we discussed and let me know if I can assist you in the future.   These are the goals we discussed: Goals    . Patient Stated     Maintain Current health status by increasing my physical activity and eating healthy. Start a small pot garden.       This is a list of the screening recommended for you and due dates:  Health Maintenance  Topic Date Due  . Tetanus Vaccine  02/10/2020  . Flu Shot  Completed  . DEXA scan (bone density measurement)  Completed  . Pneumonia vaccines  Completed     Insomnia Insomnia is a sleep disorder that makes it difficult to fall asleep or to stay asleep. Insomnia can cause tiredness (fatigue), low energy, difficulty concentrating, mood swings, and poor performance at work or school. There are three different ways to classify insomnia:  Difficulty falling asleep.  Difficulty staying asleep.  Waking up too early in the morning.  Any type of insomnia can be long-term (chronic) or short-term (acute). Both are common. Short-term insomnia usually lasts for three months or less. Chronic insomnia occurs at least three times a week for longer than three months. What are the causes? Insomnia may be caused by another condition, situation, or substance, such as:  Anxiety.  Certain medicines.  Gastroesophageal reflux disease (GERD) or other gastrointestinal conditions.  Asthma or other breathing conditions.  Restless legs  syndrome, sleep apnea, or other sleep disorders.  Chronic pain.  Menopause. This may include hot flashes.  Stroke.  Abuse of alcohol, tobacco, or illegal drugs.  Depression.  Caffeine.  Neurological disorders, such as Alzheimer disease.  An overactive thyroid (hyperthyroidism).  The cause of insomnia may not be known. What increases the risk? Risk factors for insomnia include:  Gender. Women are more commonly affected than men.  Age. Insomnia is more common as you get older.  Stress. This may involve your professional or personal life.  Income. Insomnia is more common in people with lower income.  Lack of exercise.  Irregular work schedule or night shifts.  Traveling between different time zones.  What are the signs or symptoms? If you have insomnia, trouble falling asleep or trouble staying asleep is the main symptom. This may lead to other symptoms, such as:  Feeling fatigued.  Feeling nervous about going to sleep.  Not feeling rested in the morning.  Having trouble concentrating.  Feeling irritable, anxious, or depressed.  How is this treated? Treatment for insomnia depends on the cause. If your insomnia is caused by an underlying condition, treatment will focus on addressing the condition. Treatment may also include:  Medicines to help you sleep.  Counseling or therapy.  Lifestyle adjustments.  Follow these instructions at home:  Take medicines only as directed by your health care provider.  Keep regular sleeping and waking hours. Avoid naps.  Keep a sleep diary to help you and your health care provider figure out what could be causing your insomnia. Include: ? When you  sleep. ? When you wake up during the night. ? How well you sleep. ? How rested you feel the next day. ? Any side effects of medicines you are taking. ? What you eat and drink.  Make your bedroom a comfortable place where it is easy to fall asleep: ? Put up shades or special  blackout curtains to block light from outside. ? Use a white noise machine to block noise. ? Keep the temperature cool.  Exercise regularly as directed by your health care provider. Avoid exercising right before bedtime.  Use relaxation techniques to manage stress. Ask your health care provider to suggest some techniques that may work well for you. These may include: ? Breathing exercises. ? Routines to release muscle tension. ? Visualizing peaceful scenes.  Cut back on alcohol, caffeinated beverages, and cigarettes, especially close to bedtime. These can disrupt your sleep.  Do not overeat or eat spicy foods right before bedtime. This can lead to digestive discomfort that can make it hard for you to sleep.  Limit screen use before bedtime. This includes: ? Watching TV. ? Using your smartphone, tablet, and computer.  Stick to a routine. This can help you fall asleep faster. Try to do a quiet activity, brush your teeth, and go to bed at the same time each night.  Get out of bed if you are still awake after 15 minutes of trying to sleep. Keep the lights down, but try reading or doing a quiet activity. When you feel sleepy, go back to bed.  Make sure that you drive carefully. Avoid driving if you feel very sleepy.  Keep all follow-up appointments as directed by your health care provider. This is important. Contact a health care provider if:  You are tired throughout the day or have trouble in your daily routine due to sleepiness.  You continue to have sleep problems or your sleep problems get worse. Get help right away if:  You have serious thoughts about hurting yourself or someone else. This information is not intended to replace advice given to you by your health care provider. Make sure you discuss any questions you have with your health care provider. Document Released: 02/03/2000 Document Revised: 07/08/2015 Document Reviewed: 11/06/2013 Elsevier Interactive Patient Education   [employer]2018 Elsevier Inc.

## 2017-02-08 NOTE — Assessment & Plan Note (Signed)
Ibuprofen prn 

## 2017-02-08 NOTE — Progress Notes (Signed)
Subjective:   Terri King is a 82 y.o. female who presents for an Initial Medicare Annual Wellness Visit.  Review of Systems    No ROS.  Medicare Wellness Visit. Additional risk factors are reflected in the social history. Cardiac Risk Factors include: advanced age (>61men, >34 women);dyslipidemia;hypertension Sleep patterns: gets up 1-2 times nightly to void and sleeps 4-5 hours nightly. Patient reports insomnia issues, discussed recommended sleep tips and stress reduction tips, education was provided via Emmi and AVS   Home Safety/Smoke Alarms: Feels safe in home. Smoke alarms in place.  Living environment; residence and Firearm Safety: split level / walkout, no firearms. Lives alone, no needs for DME, limited support system Seat Belt Safety/Bike Helmet: Wears seat belt.     Objective:    Today's Vitals   02/08/17 1041  BP: (!) 150/70  Pulse: 80  Temp: 98.6 F (37 C)  TempSrc: Oral  SpO2: 99%  Weight: 190 lb 4 oz (86.3 kg)  Height: 5\' 4"  (1.626 m)   Body mass index is 32.66 kg/m.  Advanced Directives 02/08/2017 08/27/2016 04/29/2015 01/25/2012 01/23/2012 01/18/2012  Does Patient Have a Medical Advance Directive? No Yes No Patient has advance directive, copy not in chart - Patient has advance directive, copy not in chart  Type of Advance Directive - Healthcare Power of [employer] Power of [place];Living will - Healthcare Power of Attorney  Copy of Healthcare Power of Attorney in Chart? - - - Copy requested from family - -  Would patient like information on creating a medical advance directive? Yes (ED - Information included in AVS) - No - patient declined information - - -  Pre-existing out of facility DNR order (yellow form or pink MOST form) - - - - No -    Current Medications (verified) Outpatient Encounter Medications as of 02/08/2017  Medication Sig  . ALPRAZolam (XANAX) 0.25 MG tablet Take 0.5-1 tablets (0.125-0.25 mg total) by mouth 2 (two) times daily  as needed for anxiety.  Marland Kitchen amLODipine (NORVASC) 5 MG tablet TAKE 1 TABLET EVERY DAY  . cholecalciferol (VITAMIN D) 400 UNITS TABS Take 400 Units by mouth daily.  Marland Kitchen denosumab (PROLIA) 60 MG/ML SOLN injection Inject 60 mg into the skin every 6 (six) months. Administer in upper arm, thigh, or abdomen  . escitalopram (LEXAPRO) 10 MG tablet TAKE 1 TABLET EVERY DAY  . fluticasone furoate-vilanterol (BREO ELLIPTA) 100-25 MCG/INH AEPB Inhale 1 puff into the lungs daily.  Marland Kitchen gabapentin (NEURONTIN) 100 MG capsule TAKE 1 CAPSULE THREE TIMES DAILY  . ibuprofen (ADVIL,MOTRIN) 600 MG tablet TAKE 1 TABLET DAILY AS NEEDED FOR MODERATE PAIN  . Krill Oil (MAXIMUM RED KRILL) 300 MG CAPS 1 po qd  . metoprolol succinate (TOPROL-XL) 50 MG 24 hr tablet TAKE 1 TABLET AT BEDTIME  . Multiple Vitamins-Minerals (EYE VITAMINS PO) Take by mouth as directed.  . Multiple Vitamins-Minerals (MULTIPLE VITAMINS/WOMENS PO) Take by mouth as directed.  . potassium chloride SA (K-DUR,KLOR-CON) 20 MEQ tablet TAKE 1 TABLET EVERY DAY  . ranitidine (ZANTAC) 150 MG tablet TAKE 1 TABLET TWICE DAILY  . rosuvastatin (CRESTOR) 5 MG tablet TAKE 1 TABLET EVERY DAY  . temazepam (RESTORIL) 30 MG capsule TAKE 1 CAPSULE AT BEDTIME AS NEEDED  FOR  SLEEP  . triamterene-hydrochlorothiazide (MAXZIDE-25) 37.5-25 MG tablet TAKE 1 TABLET EVERY DAY  . vitamin B-12 (CYANOCOBALAMIN) 100 MCG tablet Take 100 mcg by mouth daily.  . Vitamin E 400 units TABS Take by mouth as directed.  . [DISCONTINUED]  ALPRAZolam (XANAX) 0.25 MG tablet Take 0.5-1 tablets (0.125-0.25 mg total) by mouth 2 (two) times daily as needed for anxiety.  . [DISCONTINUED] gabapentin (NEURONTIN) 100 MG capsule TAKE 1 CAPSULE THREE TIMES DAILY  . [DISCONTINUED] temazepam (RESTORIL) 30 MG capsule TAKE 1 CAPSULE AT BEDTIME AS NEEDED  FOR  SLEEP   No facility-administered encounter medications on file as of 02/08/2017.     Allergies (verified) Codeine; Crestor [rosuvastatin]; Dexilant  [dexlansoprazole]; Ibuprofen; Simvastatin; Tylenol [acetaminophen]; and Iodine   History: Past Medical History:  Diagnosis Date  . CAD (coronary artery disease)    DR Tenny Crawoss; by notes normal cath in 1986, normal stress 2013  . Family history of anesthesia complication    NIENCE had sensitivity  . History of pneumonia 2008  . HTN (hypertension)    dr Posey Reaplotnikov  . Hyperlipemia   . MI (myocardial infarction) (HCC)    DR Tenny Crawoss  . Osteoarthritis   . Shortness of breath    Past Surgical History:  Procedure Laterality Date  . JOINT REPLACEMENT  12/13   L TKR   . KNEE ARTHROSCOPY     left  . PARTIAL HYSTERECTOMY    . TONSILLECTOMY    . TOTAL KNEE ARTHROPLASTY  01/23/2012   Procedure: TOTAL KNEE ARTHROPLASTY;  Surgeon: Loreta Aveaniel F Murphy, MD;  [facility];  Service: Orthopedics;  Laterality: Left;  . TOTAL KNEE ARTHROPLASTY  12/'05/2011   left knee   Family History  Problem Relation Age of Onset  . Glaucoma Father   . Hypertension Mother   . Hypertension Unknown    Social History   Socioeconomic History  . Marital status: Widowed    Spouse name: None  . Number of children: None  . Years of education: None  . Highest education level: None  Social Needs  . Financial resource strain: Not very hard  . Food insecurity - worry: Never true  . Food insecurity - inability: Never true  . Transportation needs - medical: No  . Transportation needs - non-medical: No  Occupational History  . 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 Smoking status: Never Smoker  . Smokeless tobacco: Never Used  Substance and Sexual Activity  . Alcohol use: No  . Drug use: No  . Sexual activity: No  Other Topics Concern  . None  Social History Narrative  . None    Tobacco Counseling Counseling given: Not Answered  Activities of Daily Living In your present state of health, do you have any difficulty performing the following activities: 02/08/2017    Hearing? N  Vision? N  Difficulty concentrating or making decisions? N  Walking or climbing stairs? N  Dressing or bathing? N  Doing errands, shopping? N  Preparing Food and eating ? N  Using the Toilet? N  In the past six months, have you accidently leaked urine? N  Do you have problems with loss of bowel control? N  Managing your Medications? N  Managing your Finances? N  Housekeeping or managing your Housekeeping? N  Some recent data might be hidden     Immunizations and Health Maintenance Immunization History  Administered Date(s) Administered  . Influenza, High Dose Seasonal PF 12/03/2012, 11/01/2015, 11/01/2016  . Influenza,inj,Quad PF,6+ Mos 10/22/2013  . Influenza-Unspecified 11/28/2014  . Pneumococcal Conjugate-13 11/01/2015  . Pneumococcal Polysaccharide-23 02/17/2009, 01/24/2012  . Td 02/09/2010  . Zoster 04/20/2013   There are no preventive care reminders to display for this patient.  Patient Care  Team: Tresa GarterPlotnikov, Aleksei V, MD as PCP - General Pricilla Riffleoss, Paula V, MD (Cardiology) Loreta AveMurphy, Daniel F, MD as Attending Physician (Orthopedic Surgery)  Indicate any recent Medical Services you may have received from other than [facility] providers in the past year (date may be approximate).     Assessment:   This is a routine wellness examination for Terri RegalCarol. Physical assessment deferred to PCP.  Hearing/Vision screen Hearing Screening Comments: Able to hear conversational tones w/o difficulty. No issues reported.  Passed whisper test Vision Screening Comments: Eye exam resources provided  Dietary issues and exercise activities discussed: Current Exercise Habits: The patient does not participate in regular exercise at present(chair exercises provided), Exercise limited by: orthopedic condition(s) Diet (meal preparation, eat out, water intake, caffeinated beverages, dairy products, fruits and vegetables): in general, a "healthy" diet  , on average, 2 meals per day , drinks  10-11 glasses of water daily. Reports decrease appetite.  Discussed not skipping meals and drinking nutritional supplements. Reviewed heart healthy diet  Goals    . Patient Stated     Maintain Current health status by increasing my physical activity and eating healthy. Start a small pot garden.      Depression Screen PHQ 2/9 Scores 02/08/2017 08/14/2016 06/14/2015 05/27/2014  PHQ - 2 Score 2 0 0 1  PHQ- 9 Score 8 - - -    Fall Risk Fall Risk  02/08/2017 08/14/2016 06/14/2015 05/27/2014  Falls in the past year? No Yes Yes Yes  Number falls in past yr: - 1 2 or more 1  Injury with Fall? - Yes Yes No    Cognitive Function: MMSE - Mini Mental State Exam 02/08/2017  Orientation to time 5  Orientation to Place 5  Registration 3  Attention/ Calculation 5  Recall 1  Language- name 2 objects 2  Language- repeat 1  Language- follow 3 step command 3  Language- read & follow direction 1  Write a sentence 1  Copy design 1  Total score 28        Screening Tests Health Maintenance  Topic Date Due  . TETANUS/TDAP  02/10/2020  . INFLUENZA VACCINE  Completed  . DEXA SCAN  Completed  . PNA vac Low Risk Adult  Completed     Plan:     Continue doing brain stimulating activities (puzzles, reading, adult coloring books, staying active) to keep memory sharp.   Continue to eat heart healthy diet (full of fruits, vegetables, whole grains, lean protein, water--limit salt, fat, and sugar intake) and increase physical activity as tolerated.  I have personally reviewed and noted the following in the patient's chart:   . Medical and social history . Use of alcohol, tobacco or illicit drugs  . Current medications and supplements . Functional ability and status . Nutritional status . Physical activity . Advanced directives . List of other physicians . Vitals . Screenings to include cognitive, depression, and falls . Referrals and appointments  In addition, I have reviewed and discussed  with patient certain preventive protocols, quality metrics, and best practice recommendations. A written personalized care plan for preventive services as well as general preventive health recommendations were provided to patient.     Wanda PlumpJill A Cung Masterson, RN   02/08/2017   Medical screening examination/treatment/procedure(s) were performed by non-physician practitioner and as supervising physician I was immediately available for consultation/collaboration. I agree with above. Jacinta ShoeAleksei [name], MD

## 2017-02-08 NOTE — Assessment & Plan Note (Signed)
Temazepam at hs prn - not w/Xanax  Potential benefits of a long term benzodiazepines  use as well as potential risks  and complications were explained to the patient and were aknowledged. 

## 2017-02-27 ENCOUNTER — Other Ambulatory Visit: Payer: Self-pay | Admitting: Internal Medicine

## 2017-04-03 ENCOUNTER — Other Ambulatory Visit: Payer: Self-pay | Admitting: Internal Medicine

## 2017-04-08 ENCOUNTER — Other Ambulatory Visit: Payer: Self-pay | Admitting: Internal Medicine

## 2017-05-09 ENCOUNTER — Ambulatory Visit (INDEPENDENT_AMBULATORY_CARE_PROVIDER_SITE_OTHER): Payer: Medicare HMO | Admitting: Internal Medicine

## 2017-05-09 ENCOUNTER — Encounter: Payer: Self-pay | Admitting: Internal Medicine

## 2017-05-09 DIAGNOSIS — I10 Essential (primary) hypertension: Secondary | ICD-10-CM | POA: Diagnosis not present

## 2017-05-09 DIAGNOSIS — F5101 Primary insomnia: Secondary | ICD-10-CM

## 2017-05-09 DIAGNOSIS — M25569 Pain in unspecified knee: Secondary | ICD-10-CM

## 2017-05-09 DIAGNOSIS — L0232 Furuncle of buttock: Secondary | ICD-10-CM

## 2017-05-09 MED ORDER — DOXYCYCLINE HYCLATE 100 MG PO TABS: 100.0000 mg | ORAL_TABLET | Freq: Two times a day (BID) | ORAL | 0 refills | Status: DC

## 2017-05-09 MED ORDER — TEMAZEPAM 30 MG PO CAPS: ORAL_CAPSULE | ORAL | 1 refills | Status: DC

## 2017-05-09 NOTE — Progress Notes (Signed)
Subjective:  Patient ID: Terri King, female    DOB: 1934-06-14  Age: 83 y.o. MRN: [id number]  CC: No chief complaint on file.   HPI Terri King presents for R buttock bite F/u OA, HTN, insomnia f/u  Outpatient Medications Prior to Visit  Medication Sig Dispense Refill  . ALPRAZolam (XANAX) 0.25 MG tablet Take 0.5-1 tablets (0.125-0.25 mg total) by mouth 2 (two) times daily as needed for anxiety. 120 tablet 1  . amLODipine (NORVASC) 5 MG tablet TAKE 1 TABLET EVERY DAY 90 tablet 1  . cholecalciferol (VITAMIN D) 400 UNITS TABS Take 400 Units by mouth daily.    Marland Kitchen denosumab (PROLIA) 60 MG/ML SOLN injection Inject 60 mg into the skin every 6 (six) months. Administer in upper arm, thigh, or abdomen 1 mL 0  . escitalopram (LEXAPRO) 10 MG tablet TAKE 1 TABLET EVERY DAY 90 tablet 1  . fluticasone furoate-vilanterol (BREO ELLIPTA) 100-25 MCG/INH AEPB Inhale 1 puff into the lungs daily. 1 each 5  . gabapentin (NEURONTIN) 100 MG capsule TAKE 1 CAPSULE THREE TIMES DAILY 270 capsule 3  . ibuprofen (ADVIL,MOTRIN) 600 MG tablet TAKE 1 TABLET DAILY AS NEEDED FOR MODERATE PAIN 90 tablet 1  . Krill Oil (MAXIMUM RED KRILL) 300 MG CAPS 1 po qd 100 capsule 3  . metoprolol succinate (TOPROL-XL) 50 MG 24 hr tablet TAKE 1 TABLET AT BEDTIME 90 tablet 3  . Multiple Vitamins-Minerals (EYE VITAMINS PO) Take by mouth as directed.    . Multiple Vitamins-Minerals (MULTIPLE VITAMINS/WOMENS PO) Take by mouth as directed.    . potassium chloride (KLOR-CON) 8 MEQ tablet TAKE 1 TABLET EVERY DAY 90 tablet 11  . potassium chloride SA (K-DUR,KLOR-CON) 20 MEQ tablet TAKE 1 TABLET EVERY DAY 90 tablet 2  . ranitidine (ZANTAC) 150 MG tablet TAKE 1 TABLET TWICE DAILY 180 tablet 1  . rosuvastatin (CRESTOR) 5 MG tablet TAKE 1 TABLET EVERY DAY 90 tablet 1  . temazepam (RESTORIL) 30 MG capsule TAKE 1 CAPSULE AT BEDTIME AS NEEDED  FOR  SLEEP 90 capsule 1  . triamterene-hydrochlorothiazide (MAXZIDE-25) 37.5-25 MG tablet TAKE 1  TABLET EVERY DAY 90 tablet 1  . vitamin B-12 (CYANOCOBALAMIN) 100 MCG tablet Take 100 mcg by mouth daily.    . Vitamin E 400 units TABS Take by mouth as directed.     No facility-administered medications prior to visit.     ROS Review of Systems  Constitutional: Negative for activity change, appetite change, chills, fatigue and unexpected weight change.  HENT: Negative for congestion, mouth sores and sinus pressure.   Eyes: Negative for visual disturbance.  Respiratory: Negative for cough and chest tightness.   Gastrointestinal: Negative for abdominal pain and nausea.  Genitourinary: Negative for difficulty urinating, frequency and vaginal pain.  Musculoskeletal: Positive for arthralgias, back pain and gait problem.  Skin: Negative for pallor and rash.  Neurological: Negative for dizziness, tremors, weakness, numbness and headaches.  Psychiatric/Behavioral: Positive for sleep disturbance. Negative for confusion and suicidal ideas. The patient is nervous/anxious.     Objective:  BP 128/76 (BP Location: Left Arm, Patient Position: Sitting, Cuff Size: Large)   Pulse 72   Temp 98.3 F (36.8 C) (Oral)   Ht 5\' 4"  (1.626 m)   Wt 191 lb (86.6 kg)   SpO2 99%   BMI 32.79 kg/m   BP Readings from Last 3 Encounters:  05/09/17 128/76  02/08/17 (!) 150/70  11/12/16 126/78    Wt Readings from Last 3 Encounters:  05/09/17 191  lb (86.6 kg)  02/08/17 190 lb 4 oz (86.3 kg)  11/12/16 187 lb (84.8 kg)    Physical Exam  Constitutional: She appears well-developed. No distress.  HENT:  Head: Normocephalic.  Right Ear: External ear normal.  Left Ear: External ear normal.  Nose: Nose normal.  Mouth/Throat: Oropharynx is clear and moist.  Eyes: Pupils are equal, round, and reactive to light. Conjunctivae are normal. Right eye exhibits no discharge. Left eye exhibits no discharge.  Neck: Normal range of motion. Neck supple. No JVD present. No tracheal deviation present. No thyromegaly  present.  Cardiovascular: Normal rate, regular rhythm and normal heart sounds.  Pulmonary/Chest: No stridor. No respiratory distress. She has no wheezes.  Abdominal: Soft. Bowel sounds are normal. She exhibits no distension and no mass. There is no tenderness. There is no rebound and no guarding.  Musculoskeletal: She exhibits tenderness. She exhibits no edema.  Lymphadenopathy:    She has no cervical adenopathy.  Neurological: She displays normal reflexes. No cranial nerve deficit. She exhibits normal muscle tone. Coordination normal.  Skin: No rash noted. There is erythema.  Psychiatric: She has a normal mood and affect. Her behavior is normal. Judgment and thought content normal.  L buttock - 3x4 mm healing pimple B knees tender  Lab Results  Component Value Date   WBC 9.7 02/08/2017   HGB 14.0 02/08/2017   HCT 41.7 02/08/2017   PLT 270.0 02/08/2017   GLUCOSE 98 02/08/2017   CHOL 217 (H) 02/08/2017   TRIG 150.0 (H) 02/08/2017   HDL 61.00 02/08/2017   LDLDIRECT 186.1 03/24/2013   LDLCALC 126 (H) 02/08/2017   ALT 11 02/08/2017   AST 20 02/08/2017   NA 136 02/08/2017   K 3.7 02/08/2017   CL 102 02/08/2017   CREATININE 0.89 02/08/2017   BUN 9 02/08/2017   CO2 27 02/08/2017   TSH 1.62 02/08/2017   INR 1.44 01/25/2012   HGBA1C 6.1 05/10/2016    Dg Bone Density  Result Date: 10/05/2015 Date of study: 10/03/2015 Exam: DUAL X-RAY ABSORPTIOMETRY (DXA) FOR BONE MINERAL DENSITY (BMD) Instrument: [employer] Therapist, art Provider: PCP Indication: screening for osteoporosis Comparison: none (please note that it is not possible to compare data from different instruments) Clinical data: Pt is a 83 y.o. female with history of rib fracture. On vitamin D supplements. Results:  Lumbar spine (L2, L4) Femoral neck (FN) Change in BMD from previous DXA test (%) -2.9  RFN:-2.6 LFN:-1.8  (*) statistically significant Assessment: Patient has OSTEOPOROSIS according to the [facility] classification for  osteoporosis (see below). Fracture risk: high Comments: the technical quality of the study is good, however, the spine is scoliotic and arthritic. Calcium accumulation in arthritic sites can confound the results of the bone density scan. L1 and L3 vertebrae had to be excluded from analysis. Evaluation for secondary causes should be considered if clinically indicated. Recommend optimizing calcium (1200 mg/day) and vitamin D (800 IU/day). Followup: Repeat BMD is appropriate after 1-2 years WHO criteria for diagnosis of osteoporosis in postmenopausal women and in men 54 y/o or older: - normal: T-score -1.0 to + 1.0 - osteopenia/low bone density: T-score between -2.5 and -1.0 - osteoporosis: T-score below -2.5 - severe osteoporosis: T-score below -2.5 with history of fragility fracture Note: although not part of the WHO classification, the presence of a fragility fracture, regardless of the T-score, should be considered diagnostic of osteoporosis, provided other causes for the fracture have been excluded. Treatment: The National Osteoporosis Foundation recommends that treatment be  considered in postmenopausal women and men age 81 or older with: 1. Hip or vertebral (clinical or morphometric) fracture 2. T-score of - 2.5 or lower at the spine or hip 3. 10-year fracture probability by FRAX of at least 20% for a major osteoporotic fracture and 3% for a hip fracture Carlus Pavlovristina Gherghe, MD [facility]    Assessment & Plan:   There are no diagnoses linked to this encounter. I am having Terri King maintain her vitamin B-12, cholecalciferol, MAXIMUM RED KRILL, fluticasone furoate-vilanterol, denosumab, potassium chloride SA, Multiple Vitamins-Minerals (MULTIPLE VITAMINS/WOMENS PO), Multiple Vitamins-Minerals (EYE VITAMINS PO), Vitamin E, temazepam, gabapentin, ALPRAZolam, potassium chloride, escitalopram, amLODipine, rosuvastatin, triamterene-hydrochlorothiazide, ranitidine, ibuprofen, and metoprolol  succinate.  No orders of the defined types were placed in this encounter.    Follow-up: No follow-ups on file.  Sonda PrimesAlex [name], MD

## 2017-05-09 NOTE — Assessment & Plan Note (Signed)
Temazepam  

## 2017-05-09 NOTE — Assessment & Plan Note (Signed)
Norvasc, ASA 81 

## 2017-05-09 NOTE — Assessment & Plan Note (Signed)
OA

## 2017-05-09 NOTE — Assessment & Plan Note (Signed)
L small Doxy x 5 d

## 2017-08-13 ENCOUNTER — Encounter: Payer: Self-pay | Admitting: Internal Medicine

## 2017-08-13 ENCOUNTER — Ambulatory Visit (INDEPENDENT_AMBULATORY_CARE_PROVIDER_SITE_OTHER): Payer: Medicare HMO | Admitting: Internal Medicine

## 2017-08-13 DIAGNOSIS — R519 Headache, unspecified: Secondary | ICD-10-CM | POA: Insufficient documentation

## 2017-08-13 DIAGNOSIS — R21 Rash and other nonspecific skin eruption: Secondary | ICD-10-CM | POA: Diagnosis not present

## 2017-08-13 DIAGNOSIS — M159 Polyosteoarthritis, unspecified: Secondary | ICD-10-CM

## 2017-08-13 DIAGNOSIS — M150 Primary generalized (osteo)arthritis: Secondary | ICD-10-CM

## 2017-08-13 DIAGNOSIS — R51 Headache: Secondary | ICD-10-CM

## 2017-08-13 DIAGNOSIS — I2510 Atherosclerotic heart disease of native coronary artery without angina pectoris: Secondary | ICD-10-CM

## 2017-08-13 DIAGNOSIS — G44099 Other trigeminal autonomic cephalgias (TAC), not intractable: Secondary | ICD-10-CM | POA: Diagnosis not present

## 2017-08-13 DIAGNOSIS — I10 Essential (primary) hypertension: Secondary | ICD-10-CM

## 2017-08-13 MED ORDER — TEMAZEPAM 30 MG PO CAPS: ORAL_CAPSULE | ORAL | 1 refills | Status: DC

## 2017-08-13 MED ORDER — GABAPENTIN 100 MG PO CAPS: ORAL_CAPSULE | ORAL | 3 refills | Status: DC

## 2017-08-13 MED ORDER — ALPRAZOLAM 0.25 MG PO TABS
0.1250 mg | ORAL_TABLET | Freq: Two times a day (BID) | ORAL | 1 refills | Status: DC | PRN
Start: 2017-08-13 — End: 2018-03-19

## 2017-08-13 MED ORDER — VALACYCLOVIR HCL 1 G PO TABS: 1000.0000 mg | ORAL_TABLET | Freq: Three times a day (TID) | ORAL | 0 refills | Status: DC

## 2017-08-13 MED ORDER — TRIAMCINOLONE ACETONIDE 0.1 % EX OINT: 1.0000 "application " | TOPICAL_OINTMENT | Freq: Two times a day (BID) | CUTANEOUS | 1 refills | Status: DC

## 2017-08-13 MED ORDER — METHYLPREDNISOLONE ACETATE 80 MG/ML IJ SUSP
80.0000 mg | Freq: Once | INTRAMUSCULAR | Status: AC
  Administered 2017-08-13: 80 mg via INTRAMUSCULAR

## 2017-08-13 NOTE — Assessment & Plan Note (Signed)
L side - ?shingles 6/19 Valtrex Triamc oint

## 2017-08-13 NOTE — Assessment & Plan Note (Signed)
Crestor and ASA 

## 2017-08-13 NOTE — Addendum Note (Signed)
Addended by: Scarlett PrestoFRIEDENBACH, Abram Sax on: 08/13/2017 11:24 AM   Modules accepted: Orders

## 2017-08-13 NOTE — Assessment & Plan Note (Signed)
Gabapentin

## 2017-08-13 NOTE — Assessment & Plan Note (Signed)
Norvasc

## 2017-08-13 NOTE — Progress Notes (Signed)
Subjective:  Patient ID: Terri King, female    DOB: 03-Apr-1934  Age: 83 y.o. MRN: [id number]  CC: No chief complaint on file.   HPI Terri King presents for OA, HTN, anxiety C/o L side of the head (L forehead, L ear) x 3 weeks   Outpatient Medications Prior to Visit  Medication Sig Dispense Refill  . ALPRAZolam (XANAX) 0.25 MG tablet Take 0.5-1 tablets (0.125-0.25 mg total) by mouth 2 (two) times daily as needed for anxiety. 120 tablet 1  . amLODipine (NORVASC) 5 MG tablet TAKE 1 TABLET EVERY DAY 90 tablet 1  . cholecalciferol (VITAMIN D) 400 UNITS TABS Take 400 Units by mouth daily.    Marland Kitchen. denosumab (PROLIA) 60 MG/ML SOLN injection Inject 60 mg into the skin every 6 (six) months. Administer in upper arm, thigh, or abdomen 1 mL 0  . doxycycline (VIBRA-TABS) 100 MG tablet Take 1 tablet (100 mg total) by mouth 2 (two) times daily. 10 tablet 0  . escitalopram (LEXAPRO) 10 MG tablet TAKE 1 TABLET EVERY DAY 90 tablet 1  . fluticasone furoate-vilanterol (BREO ELLIPTA) 100-25 MCG/INH AEPB Inhale 1 puff into the lungs daily. 1 each 5  . gabapentin (NEURONTIN) 100 MG capsule TAKE 1 CAPSULE THREE TIMES DAILY 270 capsule 3  . ibuprofen (ADVIL,MOTRIN) 600 MG tablet TAKE 1 TABLET DAILY AS NEEDED FOR MODERATE PAIN 90 tablet 1  . Krill Oil (MAXIMUM RED KRILL) 300 MG CAPS 1 po qd 100 capsule 3  . metoprolol succinate (TOPROL-XL) 50 MG 24 hr tablet TAKE 1 TABLET AT BEDTIME 90 tablet 3  . Multiple Vitamins-Minerals (EYE VITAMINS PO) Take by mouth as directed.    . Multiple Vitamins-Minerals (MULTIPLE VITAMINS/WOMENS PO) Take by mouth as directed.    . potassium chloride (KLOR-CON) 8 MEQ tablet TAKE 1 TABLET EVERY DAY 90 tablet 11  . potassium chloride SA (K-DUR,KLOR-CON) 20 MEQ tablet TAKE 1 TABLET EVERY DAY 90 tablet 2  . ranitidine (ZANTAC) 150 MG tablet TAKE 1 TABLET TWICE DAILY 180 tablet 1  . rosuvastatin (CRESTOR) 5 MG tablet TAKE 1 TABLET EVERY DAY 90 tablet 1  . temazepam (RESTORIL) 30 MG  capsule TAKE 1 CAPSULE AT BEDTIME AS NEEDED  FOR  SLEEP 90 capsule 1  . triamterene-hydrochlorothiazide (MAXZIDE-25) 37.5-25 MG tablet TAKE 1 TABLET EVERY DAY 90 tablet 1  . vitamin B-12 (CYANOCOBALAMIN) 100 MCG tablet Take 100 mcg by mouth daily.    . Vitamin E 400 units TABS Take by mouth as directed.     No facility-administered medications prior to visit.     ROS: Review of Systems  Constitutional: Positive for fatigue. Negative for activity change, appetite change, chills and unexpected weight change.  HENT: Positive for ear pain. Negative for congestion, mouth sores and sinus pressure.   Eyes: Negative for visual disturbance.  Respiratory: Negative for cough and chest tightness.   Gastrointestinal: Negative for abdominal pain and nausea.  Genitourinary: Negative for difficulty urinating, frequency and vaginal pain.  Musculoskeletal: Positive for arthralgias, back pain and gait problem.  Skin: Positive for rash. Negative for pallor.  Neurological: Positive for headaches. Negative for dizziness, tremors, weakness and numbness.  Psychiatric/Behavioral: Positive for decreased concentration and dysphoric mood. Negative for confusion and sleep disturbance.    Objective:  BP 132/80 (BP Location: Left Arm, Patient Position: Sitting, Cuff Size: Normal)   Pulse 72   Temp 98.5 F (36.9 C) (Oral)   Ht 5\' 4"  (1.626 m)   Wt 183 lb (83 kg)  SpO2 100%   BMI 31.41 kg/m   BP Readings from Last 3 Encounters:  08/13/17 132/80  05/09/17 128/76  02/08/17 (!) 150/70    Wt Readings from Last 3 Encounters:  08/13/17 183 lb (83 kg)  05/09/17 191 lb (86.6 kg)  02/08/17 190 lb 4 oz (86.3 kg)    Physical Exam  Constitutional: She appears well-developed. No distress.  HENT:  Head: Normocephalic.  Right Ear: External ear normal.  Left Ear: External ear normal.  Nose: Nose normal.  Mouth/Throat: Oropharynx is clear and moist.  Eyes: Pupils are equal, round, and reactive to light.  Conjunctivae are normal. Right eye exhibits no discharge. Left eye exhibits no discharge.  Neck: Normal range of motion. Neck supple. No JVD present. No tracheal deviation present. No thyromegaly present.  Cardiovascular: Normal rate, regular rhythm and normal heart sounds.  Pulmonary/Chest: No stridor. No respiratory distress. She has no wheezes.  Abdominal: Soft. Bowel sounds are normal. She exhibits no distension and no mass. There is no tenderness. There is no rebound and no guarding.  Musculoskeletal: She exhibits tenderness. She exhibits no edema.  Lymphadenopathy:    She has no cervical adenopathy.  Neurological: She displays normal reflexes. No cranial nerve deficit. She exhibits normal muscle tone. Coordination normal.  Skin: Rash noted. No erythema.  Psychiatric: She has a normal mood and affect. Her behavior is normal. Judgment and thought content normal.  L side of the head (L forehead, L ear) scabs Knees - tender L side of the head (L forehead, L ear) - scabs Lab Results  Component Value Date   WBC 9.7 02/08/2017   HGB 14.0 02/08/2017   HCT 41.7 02/08/2017   PLT 270.0 02/08/2017   GLUCOSE 98 02/08/2017   CHOL 217 (H) 02/08/2017   TRIG 150.0 (H) 02/08/2017   HDL 61.00 02/08/2017   LDLDIRECT 186.1 03/24/2013   LDLCALC 126 (H) 02/08/2017   ALT 11 02/08/2017   AST 20 02/08/2017   NA 136 02/08/2017   K 3.7 02/08/2017   CL 102 02/08/2017   CREATININE 0.89 02/08/2017   BUN 9 02/08/2017   CO2 27 02/08/2017   TSH 1.62 02/08/2017   INR 1.44 01/25/2012   HGBA1C 6.1 05/10/2016    Dg Bone Density  Result Date: 10/05/2015 Date of study: 10/03/2015 Exam: DUAL X-RAY ABSORPTIOMETRY (DXA) FOR BONE MINERAL DENSITY (BMD) Instrument: [employer] Therapist, art Provider: PCP Indication: screening for osteoporosis Comparison: none (please note that it is not possible to compare data from different instruments) Clinical data: Pt is a 83 y.o. female with history of rib fracture. On  vitamin D supplements. Results:  Lumbar spine (L2, L4) Femoral neck (FN) Change in BMD from previous DXA test (%) -2.9  RFN:-2.6 LFN:-1.8  (*) statistically significant Assessment: Patient has OSTEOPOROSIS according to the [facility] classification for osteoporosis (see below). Fracture risk: high Comments: the technical quality of the study is good, however, the spine is scoliotic and arthritic. Calcium accumulation in arthritic sites can confound the results of the bone density scan. L1 and L3 vertebrae had to be excluded from analysis. Evaluation for secondary causes should be considered if clinically indicated. Recommend optimizing calcium (1200 mg/day) and vitamin D (800 IU/day). Followup: Repeat BMD is appropriate after 1-2 years WHO criteria for diagnosis of osteoporosis in postmenopausal women and in men 64 y/o or older: - normal: T-score -1.0 to + 1.0 - osteopenia/low bone density: T-score between -2.5 and -1.0 - osteoporosis: T-score below -2.5 - severe osteoporosis: T-score below -  2.5 with history of fragility fracture Note: although not part of the WHO classification, the presence of a fragility fracture, regardless of the T-score, should be considered diagnostic of osteoporosis, provided other causes for the fracture have been excluded. Treatment: The National Osteoporosis Foundation recommends that treatment be considered in postmenopausal women and men age 60 or older with: 1. Hip or vertebral (clinical or morphometric) fracture 2. T-score of - 2.5 or lower at the spine or hip 3. 10-year fracture probability by FRAX of at least 20% for a major osteoporotic fracture and 3% for a hip fracture Carlus Pavlov, MD [facility]    Assessment & Plan:   Diagnoses and all orders for this visit:  Other trigeminal autonomic cephalgia (TAC), not intractable     No orders of the defined types were placed in this encounter.    Follow-up: No follow-ups on file.  Sonda Primes, MD

## 2017-08-13 NOTE — Assessment & Plan Note (Addendum)
ENT if not better L side of the head L forehead, L ear) x 3 weeks - ?shingles  Valtrex Triamc oint

## 2017-08-13 NOTE — Patient Instructions (Signed)
ENT if not better

## 2017-08-27 ENCOUNTER — Other Ambulatory Visit: Payer: Self-pay | Admitting: Internal Medicine

## 2017-09-16 DIAGNOSIS — Z803 Family history of malignant neoplasm of breast: Secondary | ICD-10-CM | POA: Diagnosis not present

## 2017-09-16 DIAGNOSIS — Z1231 Encounter for screening mammogram for malignant neoplasm of breast: Secondary | ICD-10-CM | POA: Diagnosis not present

## 2017-09-16 LAB — HM MAMMOGRAPHY

## 2017-09-20 LAB — HM MAMMOGRAPHY

## 2017-09-24 ENCOUNTER — Encounter: Payer: Self-pay | Admitting: Internal Medicine

## 2017-10-03 ENCOUNTER — Encounter: Payer: Self-pay | Admitting: Internal Medicine

## 2017-11-13 ENCOUNTER — Other Ambulatory Visit (INDEPENDENT_AMBULATORY_CARE_PROVIDER_SITE_OTHER): Payer: Medicare HMO

## 2017-11-13 ENCOUNTER — Ambulatory Visit (INDEPENDENT_AMBULATORY_CARE_PROVIDER_SITE_OTHER): Payer: Medicare HMO | Admitting: Internal Medicine

## 2017-11-13 ENCOUNTER — Encounter: Payer: Self-pay | Admitting: Internal Medicine

## 2017-11-13 VITALS — BP 132/78 | HR 71 | Temp 98.6°F | Ht 64.0 in | Wt 183.0 lb

## 2017-11-13 DIAGNOSIS — G8929 Other chronic pain: Secondary | ICD-10-CM

## 2017-11-13 DIAGNOSIS — M159 Polyosteoarthritis, unspecified: Secondary | ICD-10-CM

## 2017-11-13 DIAGNOSIS — R739 Hyperglycemia, unspecified: Secondary | ICD-10-CM

## 2017-11-13 DIAGNOSIS — Z23 Encounter for immunization: Secondary | ICD-10-CM | POA: Diagnosis not present

## 2017-11-13 DIAGNOSIS — I10 Essential (primary) hypertension: Secondary | ICD-10-CM | POA: Diagnosis not present

## 2017-11-13 DIAGNOSIS — M810 Age-related osteoporosis without current pathological fracture: Secondary | ICD-10-CM

## 2017-11-13 DIAGNOSIS — E785 Hyperlipidemia, unspecified: Secondary | ICD-10-CM

## 2017-11-13 DIAGNOSIS — F5101 Primary insomnia: Secondary | ICD-10-CM

## 2017-11-13 DIAGNOSIS — M25512 Pain in left shoulder: Secondary | ICD-10-CM | POA: Diagnosis not present

## 2017-11-13 DIAGNOSIS — M150 Primary generalized (osteo)arthritis: Secondary | ICD-10-CM

## 2017-11-13 LAB — BASIC METABOLIC PANEL
BUN: 17 mg/dL (ref 6–23)
CALCIUM: 10.2 mg/dL (ref 8.4–10.5)
CO2: 28 meq/L (ref 19–32)
Chloride: 103 mEq/L (ref 96–112)
Creatinine, Ser: 0.96 mg/dL (ref 0.40–1.20)
GFR: 71.61 mL/min (ref >60.00)
Glucose, Bld: 128 mg/dL — ABNORMAL HIGH (ref 70–99)
Potassium: 3.8 mEq/L (ref 3.5–5.1)
SODIUM: 140 meq/L (ref 135–145)

## 2017-11-13 LAB — LIPID PANEL
Cholesterol: 283 mg/dL — ABNORMAL HIGH (ref 0–200)
HDL: 57.7 mg/dL
LDL Cholesterol: 185 mg/dL — ABNORMAL HIGH (ref 0–99)
NonHDL: 225
Total CHOL/HDL Ratio: 5
Triglycerides: 198 mg/dL — ABNORMAL HIGH (ref 0.0–149.0)
VLDL: 39.6 mg/dL (ref 0.0–40.0)

## 2017-11-13 LAB — HEPATIC FUNCTION PANEL
ALT: 11 U/L (ref 0–35)
AST: 16 U/L (ref 0–37)
Albumin: 4.3 g/dL (ref 3.5–5.2)
Alkaline Phosphatase: 85 U/L (ref 39–117)
BILIRUBIN DIRECT: 0.1 mg/dL (ref 0.0–0.3)
BILIRUBIN TOTAL: 0.5 mg/dL (ref 0.2–1.2)
Total Protein: 7.8 g/dL (ref 6.0–8.3)

## 2017-11-13 LAB — CBC WITH DIFFERENTIAL/PLATELET
BASOS ABS: 0.1 10*3/uL (ref 0.0–0.1)
Basophils Relative: 1.2 % (ref 0.0–3.0)
Eosinophils Absolute: 0.3 10*3/uL (ref 0.0–0.7)
Eosinophils Relative: 3.4 % (ref 0.0–5.0)
HEMATOCRIT: 40.8 % (ref 36.0–46.0)
HEMOGLOBIN: 14.2 g/dL (ref 12.0–15.0)
LYMPHS PCT: 21.1 % (ref 12.0–46.0)
Lymphs Abs: 1.7 10*3/uL (ref 0.7–4.0)
MCHC: 34.7 g/dL (ref 30.0–36.0)
MCV: 92 fl (ref 78.0–100.0)
Monocytes Absolute: 0.7 10*3/uL (ref 0.1–1.0)
Monocytes Relative: 9.3 % (ref 3.0–12.0)
NEUTROS ABS: 5.2 10*3/uL (ref 1.4–7.7)
Neutrophils Relative %: 65 % (ref 43.0–77.0)
PLATELETS: 273 10*3/uL (ref 150.0–400.0)
RBC: 4.44 Mil/uL (ref 3.87–5.11)
RDW: 13.6 % (ref 11.5–15.5)
WBC: 7.9 10*3/uL (ref 4.0–10.5)

## 2017-11-13 LAB — URINALYSIS
Bilirubin Urine: NEGATIVE
HGB URINE DIPSTICK: NEGATIVE
Ketones, ur: NEGATIVE
LEUKOCYTES UA: NEGATIVE
NITRITE: NEGATIVE
SPECIFIC GRAVITY, URINE: 1.025 (ref 1.000–1.030)
Total Protein, Urine: NEGATIVE
UROBILINOGEN UA: 0.2 (ref 0.0–1.0)
Urine Glucose: NEGATIVE
pH: 6 (ref 5.0–8.0)

## 2017-11-13 LAB — TSH: TSH: 1.51 u[IU]/mL (ref 0.35–4.50)

## 2017-11-13 MED ORDER — TEMAZEPAM 30 MG PO CAPS: ORAL_CAPSULE | ORAL | 1 refills | Status: DC

## 2017-11-13 MED ORDER — IBUPROFEN 600 MG PO TABS: ORAL_TABLET | ORAL | 1 refills | Status: DC

## 2017-11-13 NOTE — Assessment & Plan Note (Signed)
Norvasc, ASA 81 

## 2017-11-13 NOTE — Progress Notes (Signed)
Subjective:  Patient ID: Terri King, female    DOB: Jul 06, 1934  Age: 83 y.o. MRN: [id number]  CC: No chief complaint on file.   HPI Terri King presents for OA, insomnia, HTN f/u  Outpatient Medications Prior to Visit  Medication Sig Dispense Refill  . ALPRAZolam (XANAX) 0.25 MG tablet Take 0.5-1 tablets (0.125-0.25 mg total) by mouth 2 (two) times daily as needed for anxiety. 120 tablet 1  . amLODipine (NORVASC) 5 MG tablet TAKE 1 TABLET EVERY DAY 90 tablet 1  . cholecalciferol (VITAMIN D) 400 UNITS TABS Take 400 Units by mouth daily.    Marland Kitchen denosumab (PROLIA) 60 MG/ML SOLN injection Inject 60 mg into the skin every 6 (six) months. Administer in upper arm, thigh, or abdomen 1 mL 0  . escitalopram (LEXAPRO) 10 MG tablet TAKE 1 TABLET EVERY DAY 90 tablet 1  . fluticasone furoate-vilanterol (BREO ELLIPTA) 100-25 MCG/INH AEPB Inhale 1 puff into the lungs daily. 1 each 5  . gabapentin (NEURONTIN) 100 MG capsule TAKE 1 CAPSULE THREE TIMES DAILY 270 capsule 3  . ibuprofen (ADVIL,MOTRIN) 600 MG tablet TAKE 1 TABLET DAILY AS NEEDED FOR MODERATE PAIN 90 tablet 1  . Krill Oil (MAXIMUM RED KRILL) 300 MG CAPS 1 po qd 100 capsule 3  . metoprolol succinate (TOPROL-XL) 50 MG 24 hr tablet TAKE 1 TABLET AT BEDTIME 90 tablet 3  . Multiple Vitamins-Minerals (EYE VITAMINS PO) Take by mouth as directed.    . Multiple Vitamins-Minerals (MULTIPLE VITAMINS/WOMENS PO) Take by mouth as directed.    . potassium chloride (KLOR-CON) 8 MEQ tablet TAKE 1 TABLET EVERY DAY 90 tablet 11  . potassium chloride SA (K-DUR,KLOR-CON) 20 MEQ tablet TAKE 1 TABLET EVERY DAY 90 tablet 2  . ranitidine (ZANTAC) 150 MG tablet TAKE 1 TABLET TWICE DAILY 180 tablet 1  . rosuvastatin (CRESTOR) 5 MG tablet TAKE 1 TABLET EVERY DAY 90 tablet 1  . temazepam (RESTORIL) 30 MG capsule TAKE 1 CAPSULE AT BEDTIME AS NEEDED  FOR  SLEEP 90 capsule 1  . triamcinolone ointment (KENALOG) 0.1 % Apply 1 application topically 2 (two) times daily.  rash 30 g 1  . triamterene-hydrochlorothiazide (MAXZIDE-25) 37.5-25 MG tablet TAKE 1 TABLET EVERY DAY 90 tablet 1  . valACYclovir (VALTREX) 1000 MG tablet Take 1 tablet (1,000 mg total) by mouth 3 (three) times daily. 21 tablet 0  . vitamin B-12 (CYANOCOBALAMIN) 100 MCG tablet Take 100 mcg by mouth daily.    . Vitamin E 400 units TABS Take by mouth as directed.     No facility-administered medications prior to visit.     ROS: Review of Systems  Constitutional: Negative for activity change, appetite change, chills, fatigue and unexpected weight change.  HENT: Negative for congestion, mouth sores and sinus pressure.   Eyes: Negative for visual disturbance.  Respiratory: Negative for cough and chest tightness.   Gastrointestinal: Negative for abdominal pain and nausea.  Genitourinary: Negative for difficulty urinating, frequency and vaginal pain.  Musculoskeletal: Positive for arthralgias and gait problem. Negative for back pain.  Skin: Negative for pallor and rash.  Neurological: Negative for dizziness, tremors, weakness, numbness and headaches.  Psychiatric/Behavioral: Positive for sleep disturbance. Negative for confusion and suicidal ideas. The patient is nervous/anxious.     Objective:  BP 132/78 (BP Location: Left Arm, Patient Position: Sitting, Cuff Size: Large)   Pulse 71   Temp 98.6 F (37 C) (Oral)   Ht 5\' 4"  (1.626 m)   Wt 183 lb (83 kg)  SpO2 98%   BMI 31.41 kg/m   BP Readings from Last 3 Encounters:  11/13/17 132/78  08/13/17 132/80  05/09/17 128/76    Wt Readings from Last 3 Encounters:  11/13/17 183 lb (83 kg)  08/13/17 183 lb (83 kg)  05/09/17 191 lb (86.6 kg)    Physical Exam  Constitutional: She appears well-developed. No distress.  HENT:  Head: Normocephalic.  Right Ear: External ear normal.  Left Ear: External ear normal.  Nose: Nose normal.  Mouth/Throat: Oropharynx is clear and moist.  Eyes: Pupils are equal, round, and reactive to light.  Conjunctivae are normal. Right eye exhibits no discharge. Left eye exhibits no discharge.  Neck: Normal range of motion. Neck supple. No JVD present. No tracheal deviation present. No thyromegaly present.  Cardiovascular: Normal rate, regular rhythm and normal heart sounds.  Pulmonary/Chest: No stridor. No respiratory distress. She has no wheezes.  Abdominal: Soft. Bowel sounds are normal. She exhibits no distension and no mass. There is no tenderness. There is no rebound and no guarding.  Musculoskeletal: She exhibits tenderness. She exhibits no edema.  Lymphadenopathy:    She has no cervical adenopathy.  Neurological: She displays normal reflexes. No cranial nerve deficit. She exhibits normal muscle tone. Coordination abnormal.  Skin: No rash noted. No erythema.  Psychiatric: She has a normal mood and affect. Her behavior is normal. Judgment and thought content normal.  knee and shoulder - pain w/ROM Walking stick  Lab Results  Component Value Date   WBC 9.7 02/08/2017   HGB 14.0 02/08/2017   HCT 41.7 02/08/2017   PLT 270.0 02/08/2017   GLUCOSE 98 02/08/2017   CHOL 217 (H) 02/08/2017   TRIG 150.0 (H) 02/08/2017   HDL 61.00 02/08/2017   LDLDIRECT 186.1 03/24/2013   LDLCALC 126 (H) 02/08/2017   ALT 11 02/08/2017   AST 20 02/08/2017   NA 136 02/08/2017   K 3.7 02/08/2017   CL 102 02/08/2017   CREATININE 0.89 02/08/2017   BUN 9 02/08/2017   CO2 27 02/08/2017   TSH 1.62 02/08/2017   INR 1.44 01/25/2012   HGBA1C 6.1 05/10/2016    Dg Bone Density  Result Date: 10/05/2015 Date of study: 10/03/2015 Exam: DUAL X-RAY ABSORPTIOMETRY (DXA) FOR BONE MINERAL DENSITY (BMD) Instrument: [employer] Therapist, art Provider: PCP Indication: screening for osteoporosis Comparison: none (please note that it is not possible to compare data from different instruments) Clinical data: Pt is a 83 y.o. female with history of rib fracture. On vitamin D supplements. Results:  Lumbar spine (L2, L4)  Femoral neck (FN) Change in BMD from previous DXA test (%) -2.9  RFN:-2.6 LFN:-1.8  (*) statistically significant Assessment: Patient has OSTEOPOROSIS according to the [facility] classification for osteoporosis (see below). Fracture risk: high Comments: the technical quality of the study is good, however, the spine is scoliotic and arthritic. Calcium accumulation in arthritic sites can confound the results of the bone density scan. L1 and L3 vertebrae had to be excluded from analysis. Evaluation for secondary causes should be considered if clinically indicated. Recommend optimizing calcium (1200 mg/day) and vitamin D (800 IU/day). Followup: Repeat BMD is appropriate after 1-2 years WHO criteria for diagnosis of osteoporosis in postmenopausal women and in men 60 y/o or older: - normal: T-score -1.0 to + 1.0 - osteopenia/low bone density: T-score between -2.5 and -1.0 - osteoporosis: T-score below -2.5 - severe osteoporosis: T-score below -2.5 with history of fragility fracture Note: although not part of the WHO classification, the presence of  a fragility fracture, regardless of the T-score, should be considered diagnostic of osteoporosis, provided other causes for the fracture have been excluded. Treatment: The National Osteoporosis Foundation recommends that treatment be considered in postmenopausal women and men age 4 or older with: 1. Hip or vertebral (clinical or morphometric) fracture 2. T-score of - 2.5 or lower at the spine or hip 3. 10-year fracture probability by FRAX of at least 20% for a major osteoporotic fracture and 3% for a hip fracture Carlus Pavlov, MD [facility]    Assessment & Plan:   There are no diagnoses linked to this encounter.   No orders of the defined types were placed in this encounter.    Follow-up: No follow-ups on file.  Sonda Primes, MD

## 2017-11-13 NOTE — Assessment & Plan Note (Signed)
Temazepam at hs prn - not w/Xanax  Potential benefits of a long term benzodiazepines  use as well as potential risks  and complications were explained to the patient and were aknowledged. 

## 2017-11-13 NOTE — Assessment & Plan Note (Signed)
Ibuprofen prn  Potential benefits of a long term NSAIDs use as well as potential risks  and complications were explained to the patient and were aknowledged.

## 2017-11-13 NOTE — Assessment & Plan Note (Signed)
Pt stopped prolia

## 2017-11-13 NOTE — Addendum Note (Signed)
Addended by: Scarlett Presto on: 11/13/2017 11:44 AM   Modules accepted: Orders

## 2017-11-13 NOTE — Assessment & Plan Note (Signed)
Ibuprofen prn Gabapentin prn 

## 2017-11-14 ENCOUNTER — Telehealth: Payer: Self-pay | Admitting: Internal Medicine

## 2017-11-14 NOTE — Telephone Encounter (Signed)
Copied from CRM [id number]. Topic: General - Other >> Nov 14, 2017 12:43 PM Laural Benes, [place] C wrote: Reason for CRM: pt and daughter in law called in to follow up on lab result for Cholesterol. Relayed pt's total cholesterol number to pt. I did advise pt again of result notes and they CMA is waiting to be advised by provider about Crestor medication. Daughter in law Misty Stanley) and pt has some further questions.   Daughter in law is asking that the office contact her back and then she will 3 way pt for group conversation to make sure that pt completely understands and to assist pt.    Misty [phone]

## 2017-11-14 NOTE — Telephone Encounter (Signed)
Terri King is not on DPR.   Please advise and lab results and call patient

## 2017-11-15 NOTE — Telephone Encounter (Signed)
Spoke with daughter in law, and she stated pt complained about being nauseated while on Crestor so they did not know if something else could be called in. message sent to PCP

## 2017-11-19 NOTE — Telephone Encounter (Signed)
Terri King is calling back on request to change the Crestor. Pt has stopped taking this med and wants to know what to do. Terri King would like you to call her back.

## 2017-11-20 NOTE — Telephone Encounter (Signed)
Pt informed of MD response.  

## 2017-11-20 NOTE — Telephone Encounter (Signed)
We can try Livalo 1 mg a day if covered I'll email a Rx Lipids, LFTs 3 mo Thx

## 2017-11-20 NOTE — Telephone Encounter (Signed)
Pt and daughter calling to f/up on this request.  Please call pt.

## 2017-11-20 NOTE — Addendum Note (Signed)
Addended by: Tresa Garter on: 11/20/2017 07:30 AM   Modules accepted: Orders

## 2017-11-26 ENCOUNTER — Telehealth: Payer: Self-pay | Admitting: Internal Medicine

## 2017-11-26 MED ORDER — PITAVASTATIN CALCIUM 1 MG PO TABS: 1.0000 mg | ORAL_TABLET | Freq: Every day | ORAL | 3 refills | Status: DC

## 2017-11-26 NOTE — Telephone Encounter (Deleted)
Copied from CRM [id number]. Topic: General - Other >> Nov 26, 2017 12:13 PM Gerrianne Scale wrote: Reason for CRM: lisa pts daughter in law called stating pt complained that the Crestor made her feel nauseated all the time and that was one of the main reasons for stopping the Medication. She states that someone from the Office had spoken with around the 27th of Sept and they advised pt that she was going to be put on lipitor they have been waiting on the medicine for two weeks they are very upset please call to explain Terri King [phone]

## 2017-11-26 NOTE — Telephone Encounter (Signed)
error 

## 2017-11-26 NOTE — Telephone Encounter (Signed)
Patient never received Livalo 90 day supply. Please send to [facility] mail order.

## 2017-11-26 NOTE — Addendum Note (Signed)
Addended by: Scarlett Presto on: 11/26/2017 11:36 AM   Modules accepted: Orders

## 2017-11-26 NOTE — Telephone Encounter (Signed)
RX sent

## 2017-11-28 ENCOUNTER — Other Ambulatory Visit: Payer: Self-pay | Admitting: Internal Medicine

## 2017-11-28 MED ORDER — PRAVASTATIN SODIUM 20 MG PO TABS: 20.0000 mg | ORAL_TABLET | Freq: Every day | ORAL | 3 refills | Status: DC

## 2017-12-02 NOTE — Telephone Encounter (Signed)
Patient calling and states that the insurance company will not cover the medication that was sent in to the pharmacy [facility]) because it is going to be $1,000. Patient states that she can not take crestor. Would like an alternative sent to the pharmacy. Please advise. [address] - [place], [place] - [address] CB#: [phone]

## 2017-12-03 NOTE — Addendum Note (Signed)
Addended by: Eustace Moore on: 12/03/2017 10:14 AM   Modules accepted: Orders

## 2017-12-03 NOTE — Telephone Encounter (Signed)
Please advise about alternate medication in Dr. Loren Racer absence.

## 2017-12-03 NOTE — Telephone Encounter (Signed)
It looks like she has Pravachol on her medication list- did she ache on this also?

## 2017-12-03 NOTE — Telephone Encounter (Deleted)
Will send in trial of Pravachol for her; will start with a low dose to see how she does; if develops aches on this, stop it and let Dr. Posey Rea know.

## 2017-12-04 NOTE — Telephone Encounter (Signed)
LMTCB

## 2017-12-06 NOTE — Telephone Encounter (Signed)
Pravastatin was ordered as the replacement and will check with [facility] to make sure they have RX

## 2017-12-06 NOTE — Telephone Encounter (Signed)
Misty Stanley, Daughter in [place] ( not on [place] ) would like a call back regarding what would be called in for her in place of this. She can be reached at [phone]

## 2018-01-14 ENCOUNTER — Other Ambulatory Visit: Payer: Self-pay | Admitting: Internal Medicine

## 2018-01-20 ENCOUNTER — Other Ambulatory Visit: Payer: Self-pay | Admitting: Internal Medicine

## 2018-03-18 ENCOUNTER — Other Ambulatory Visit: Payer: Self-pay | Admitting: Internal Medicine

## 2018-03-19 MED ORDER — ALPRAZOLAM 0.25 MG PO TABS: 0.1250 mg | ORAL_TABLET | Freq: Two times a day (BID) | ORAL | 1 refills | Status: DC | PRN

## 2018-03-19 NOTE — Telephone Encounter (Signed)
Check [place] registry last filled 30 day supply 01/21/2018.Marland KitchenRaechel Chute

## 2018-03-24 ENCOUNTER — Other Ambulatory Visit: Payer: Self-pay | Admitting: Internal Medicine

## 2018-05-06 ENCOUNTER — Other Ambulatory Visit: Payer: Self-pay | Admitting: Internal Medicine

## 2018-05-06 NOTE — Telephone Encounter (Signed)
Copied from CRM [id number]. Topic: Quick Communication - Rx Refill/Question >> May 06, 2018  3:01 PM Wyonia Hough E wrote: Medication: temazepam (RESTORIL) 30 MG capsule  Has the patient contacted their pharmacy? Yes - call PCP  Preferred Pharmacy (with phone number or street name): [facility] - [place], [place] - [address] [phone] (Phone) [phone] (Fax)    Agent: Please be advised that RX refills may take up to 3 business days. We ask that you follow-up with your pharmacy.

## 2018-05-07 NOTE — Telephone Encounter (Signed)
Control database checked last refill: 04/16/2018 LOV: 11/13/2017 NOV:08/07/2018

## 2018-05-12 MED ORDER — TEMAZEPAM 30 MG PO CAPS: ORAL_CAPSULE | ORAL | 1 refills | Status: DC

## 2018-05-14 ENCOUNTER — Encounter: Payer: Medicare HMO | Admitting: Internal Medicine

## 2018-06-03 ENCOUNTER — Other Ambulatory Visit: Payer: Self-pay | Admitting: Internal Medicine

## 2018-06-16 ENCOUNTER — Telehealth: Payer: Self-pay | Admitting: Internal Medicine

## 2018-06-16 MED ORDER — FAMOTIDINE 40 MG PO TABS: 40.0000 mg | ORAL_TABLET | Freq: Every day | ORAL | 3 refills | Status: DC

## 2018-06-16 NOTE — Telephone Encounter (Signed)
Changed to Pepcid Thx

## 2018-06-16 NOTE — Telephone Encounter (Signed)
Copied from CRM [id number]. Topic: Quick Communication - Rx Refill/Question >> Jun 16, 2018  2:02 PM Crist Infante wrote: Medication: ranitidine (ZANTAC) 150 MG tablet Pt got letter from [place] this med recalled, requesting another rmed to take its place [facility] - [place], [place] - [address] [phone] (Phone) [phone] (Fax)

## 2018-06-16 NOTE — Telephone Encounter (Signed)
Patient said that her insurance is no longer covering her Ranitidine. Can she please have something else sent to pharmacy to replace this medication

## 2018-06-16 NOTE — Telephone Encounter (Signed)
Please advise 

## 2018-07-07 ENCOUNTER — Other Ambulatory Visit: Payer: Self-pay | Admitting: Internal Medicine

## 2018-08-07 ENCOUNTER — Other Ambulatory Visit: Payer: Self-pay

## 2018-08-07 ENCOUNTER — Other Ambulatory Visit (INDEPENDENT_AMBULATORY_CARE_PROVIDER_SITE_OTHER): Payer: Medicare HMO

## 2018-08-07 ENCOUNTER — Encounter: Payer: Self-pay | Admitting: Internal Medicine

## 2018-08-07 ENCOUNTER — Ambulatory Visit (INDEPENDENT_AMBULATORY_CARE_PROVIDER_SITE_OTHER): Payer: Medicare HMO | Admitting: Internal Medicine

## 2018-08-07 VITALS — BP 140/82 | HR 73 | Temp 98.0°F | Ht 64.0 in | Wt 178.0 lb

## 2018-08-07 DIAGNOSIS — E559 Vitamin D deficiency, unspecified: Secondary | ICD-10-CM | POA: Diagnosis not present

## 2018-08-07 DIAGNOSIS — M150 Primary generalized (osteo)arthritis: Secondary | ICD-10-CM | POA: Diagnosis not present

## 2018-08-07 DIAGNOSIS — R29898 Other symptoms and signs involving the musculoskeletal system: Secondary | ICD-10-CM

## 2018-08-07 DIAGNOSIS — E785 Hyperlipidemia, unspecified: Secondary | ICD-10-CM

## 2018-08-07 DIAGNOSIS — R202 Paresthesia of skin: Secondary | ICD-10-CM

## 2018-08-07 DIAGNOSIS — F4323 Adjustment disorder with mixed anxiety and depressed mood: Secondary | ICD-10-CM | POA: Diagnosis not present

## 2018-08-07 DIAGNOSIS — M7051 Other bursitis of knee, right knee: Secondary | ICD-10-CM | POA: Diagnosis not present

## 2018-08-07 DIAGNOSIS — Z0000 Encounter for general adult medical examination without abnormal findings: Secondary | ICD-10-CM | POA: Diagnosis not present

## 2018-08-07 DIAGNOSIS — I10 Essential (primary) hypertension: Secondary | ICD-10-CM

## 2018-08-07 DIAGNOSIS — G8929 Other chronic pain: Secondary | ICD-10-CM | POA: Diagnosis not present

## 2018-08-07 DIAGNOSIS — M159 Polyosteoarthritis, unspecified: Secondary | ICD-10-CM

## 2018-08-07 DIAGNOSIS — R269 Unspecified abnormalities of gait and mobility: Secondary | ICD-10-CM

## 2018-08-07 DIAGNOSIS — F5101 Primary insomnia: Secondary | ICD-10-CM

## 2018-08-07 LAB — BASIC METABOLIC PANEL
BUN: 12 mg/dL (ref 6–23)
CO2: 26 mEq/L (ref 19–32)
Calcium: 9.9 mg/dL (ref 8.4–10.5)
Chloride: 101 mEq/L (ref 96–112)
Creatinine, Ser: 0.89 mg/dL (ref 0.40–1.20)
GFR: 73.39 mL/min (ref >60.00)
Glucose, Bld: 129 mg/dL — ABNORMAL HIGH (ref 70–99)
Potassium: 3.5 mEq/L (ref 3.5–5.1)
Sodium: 137 mEq/L (ref 135–145)

## 2018-08-07 LAB — URINALYSIS
Bilirubin Urine: NEGATIVE
Hgb urine dipstick: NEGATIVE
Ketones, ur: NEGATIVE
Leukocytes,Ua: NEGATIVE
Nitrite: NEGATIVE
Specific Gravity, Urine: 1.02 (ref 1.000–1.030)
Total Protein, Urine: NEGATIVE
Urine Glucose: NEGATIVE
Urobilinogen, UA: 0.2 (ref 0.0–1.0)
pH: 6.5 (ref 5.0–8.0)

## 2018-08-07 LAB — CBC WITH DIFFERENTIAL/PLATELET
Basophils Absolute: 0.1 10*3/uL (ref 0.0–0.1)
Basophils Relative: 0.9 % (ref 0.0–3.0)
Eosinophils Absolute: 0.2 10*3/uL (ref 0.0–0.7)
Eosinophils Relative: 2.8 % (ref 0.0–5.0)
HCT: 39.3 % (ref 36.0–46.0)
Hemoglobin: 13.5 g/dL (ref 12.0–15.0)
Lymphocytes Relative: 16.6 % (ref 12.0–46.0)
Lymphs Abs: 1.4 10*3/uL (ref 0.7–4.0)
MCHC: 34.4 g/dL (ref 30.0–36.0)
MCV: 90.8 fl (ref 78.0–100.0)
Monocytes Absolute: 0.8 10*3/uL (ref 0.1–1.0)
Monocytes Relative: 9.2 % (ref 3.0–12.0)
Neutro Abs: 5.9 10*3/uL (ref 1.4–7.7)
Neutrophils Relative %: 70.5 % (ref 43.0–77.0)
Platelets: 278 10*3/uL (ref 150.0–400.0)
RBC: 4.33 Mil/uL (ref 3.87–5.11)
RDW: 13.1 % (ref 11.5–15.5)
WBC: 8.4 10*3/uL (ref 4.0–10.5)

## 2018-08-07 LAB — VITAMIN D 25 HYDROXY (VIT D DEFICIENCY, FRACTURES): VITD: 52.46 ng/mL (ref 30.00–100.00)

## 2018-08-07 LAB — HEPATIC FUNCTION PANEL
ALT: 10 U/L (ref 0–35)
AST: 18 U/L (ref 0–37)
Albumin: 4.2 g/dL (ref 3.5–5.2)
Alkaline Phosphatase: 87 U/L (ref 39–117)
Bilirubin, Direct: 0.1 mg/dL (ref 0.0–0.3)
Total Bilirubin: 0.8 mg/dL (ref 0.2–1.2)
Total Protein: 7.3 g/dL (ref 6.0–8.3)

## 2018-08-07 LAB — LIPID PANEL
Cholesterol: 254 mg/dL — ABNORMAL HIGH (ref 0–200)
HDL: 55.5 mg/dL (ref >39.00)
LDL Cholesterol: 160 mg/dL — ABNORMAL HIGH (ref 0–99)
NonHDL: 198.3
Total CHOL/HDL Ratio: 5
Triglycerides: 190 mg/dL — ABNORMAL HIGH (ref 0.0–149.0)
VLDL: 38 mg/dL (ref 0.0–40.0)

## 2018-08-07 LAB — VITAMIN B12: Vitamin B-12: 1448 pg/mL — ABNORMAL HIGH (ref 211–911)

## 2018-08-07 LAB — TSH: TSH: 1.84 u[IU]/mL (ref 0.35–4.50)

## 2018-08-07 MED ORDER — ESCITALOPRAM OXALATE 10 MG PO TABS: 10.0000 mg | ORAL_TABLET | Freq: Every day | ORAL | 3 refills | Status: DC

## 2018-08-07 NOTE — Assessment & Plan Note (Signed)
PT/OT

## 2018-08-07 NOTE — Patient Instructions (Signed)
Health Maintenance for Postmenopausal Women Menopause is a normal process in which your reproductive ability comes to an end. This process happens gradually over a span of months to years, usually between the ages of 62 and 89. Menopause is complete when you have missed 12 consecutive menstrual periods. It is important to talk with your health care provider about some of the most common conditions that affect postmenopausal women, such as heart disease, cancer, and bone loss (osteoporosis). Adopting a healthy lifestyle and getting preventive care can help to promote your health and wellness. Those actions can also lower your chances of developing some of these common conditions. What should I know about menopause? During menopause, you may experience a number of symptoms, such as:  Moderate-to-severe hot flashes.  Night sweats.  Decrease in sex drive.  Mood swings.  Headaches.  Tiredness.  Irritability.  Memory problems.  Insomnia. Choosing to treat or not to treat menopausal changes is an individual decision that you make with your health care provider. What should I know about hormone replacement therapy and supplements? Hormone therapy products are effective for treating symptoms that are associated with menopause, such as hot flashes and night sweats. Hormone replacement carries certain risks, especially as you become older. If you are thinking about using estrogen or estrogen with progestin treatments, discuss the benefits and risks with your health care provider. What should I know about heart disease and stroke? Heart disease, heart attack, and stroke become more likely as you age. This may be due, in part, to the hormonal changes that your body experiences during menopause. These can affect how your body processes dietary fats, triglycerides, and cholesterol. Heart attack and stroke are both medical emergencies. There are many things that you can do to help prevent heart disease  and stroke:  Have your blood pressure checked at least every 1-2 years. High blood pressure causes heart disease and increases the risk of stroke.  If you are 79-72 years old, ask your health care provider if you should take aspirin to prevent a heart attack or a stroke.  Do not use any tobacco products, including cigarettes, chewing tobacco, or electronic cigarettes. If you need help quitting, ask your health care provider.  It is important to eat a healthy diet and maintain a healthy weight. ? Be sure to include plenty of vegetables, fruits, low-fat dairy products, and lean protein. ? Avoid eating foods that are high in solid fats, added sugars, or salt (sodium).  Get regular exercise. This is one of the most important things that you can do for your health. ? Try to exercise for at least 150 minutes each week. The type of exercise that you do should increase your heart rate and make you sweat. This is known as moderate-intensity exercise. ? Try to do strengthening exercises at least twice each week. Do these in addition to the moderate-intensity exercise.  Know your numbers.Ask your health care provider to check your cholesterol and your blood glucose. Continue to have your blood tested as directed by your health care provider.  What should I know about cancer screening? There are several types of cancer. Take the following steps to reduce your risk and to catch any cancer development as early as possible. Breast Cancer  Practice breast self-awareness. ? This means understanding how your breasts normally appear and feel. ? It also means doing regular breast self-exams. Let your health care provider know about any changes, no matter how small.  If you are 40 or  older, have a clinician do a breast exam (clinical breast exam or CBE) every year. Depending on your age, family history, and medical history, it may be recommended that you also have a yearly breast X-ray (mammogram).  If you  have a family history of breast cancer, talk with your health care provider about genetic screening.  If you are at high risk for breast cancer, talk with your health care provider about having an MRI and a mammogram every year.  Breast cancer (BRCA) gene test is recommended for women who have family members with BRCA-related cancers. Results of the assessment will determine the need for genetic counseling and BRCA1 and for BRCA2 testing. BRCA-related cancers include these types: ? Breast. This occurs in males or females. ? Ovarian. ? Tubal. This may also be called fallopian tube cancer. ? Cancer of the abdominal or pelvic lining (peritoneal cancer). ? Prostate. ? Pancreatic. Cervical, Uterine, and Ovarian Cancer Your health care provider may recommend that you be screened regularly for cancer of the pelvic organs. These include your ovaries, uterus, and vagina. This screening involves a pelvic exam, which includes checking for microscopic changes to the surface of your cervix (Pap test).  For women ages 21-65, health care providers may recommend a pelvic exam and a Pap test every three years. For women ages 39-65, they may recommend the Pap test and pelvic exam, combined with testing for human papilloma virus (HPV), every five years. Some types of HPV increase your risk of cervical cancer. Testing for HPV may also be done on women of any age who have unclear Pap test results.  Other health care providers may not recommend any screening for nonpregnant women who are considered low risk for pelvic cancer and have no symptoms. Ask your health care provider if a screening pelvic exam is right for you.  If you have had past treatment for cervical cancer or a condition that could lead to cancer, you need Pap tests and screening for cancer for at least 20 years after your treatment. If Pap tests have been discontinued for you, your risk factors (such as having a new sexual partner) need to be reassessed  to determine if you should start having screenings again. Some women have medical problems that increase the chance of getting cervical cancer. In these cases, your health care provider may recommend that you have screening and Pap tests more often.  If you have a family history of uterine cancer or ovarian cancer, talk with your health care provider about genetic screening.  If you have vaginal bleeding after reaching menopause, tell your health care provider.  There are currently no reliable tests available to screen for ovarian cancer. Lung Cancer Lung cancer screening is recommended for adults 57-50 years old who are at high risk for lung cancer because of a history of smoking. A yearly low-dose CT scan of the lungs is recommended if you:  Currently smoke.  Have a history of at least 30 pack-years of smoking and you currently smoke or have quit within the past 15 years. A pack-year is smoking an average of one pack of cigarettes per day for one year. Yearly screening should:  Continue until it has been 15 years since you quit.  Stop if you develop a health problem that would prevent you from having lung cancer treatment. Colorectal Cancer  This type of cancer can be detected and can often be prevented.  Routine colorectal cancer screening usually begins at age 12 and continues through  age 63.  If you have risk factors for colon cancer, your health care provider may recommend that you be screened at an earlier age.  If you have a family history of colorectal cancer, talk with your health care provider about genetic screening.  Your health care provider may also recommend using home test kits to check for hidden blood in your stool.  A small camera at the end of a tube can be used to examine your colon directly (sigmoidoscopy or colonoscopy). This is done to check for the earliest forms of colorectal cancer.  Direct examination of the colon should be repeated every 5-10 years until  age 75. However, if early forms of precancerous polyps or small growths are found or if you have a family history or genetic risk for colorectal cancer, you may need to be screened more often. Skin Cancer  Check your skin from head to toe regularly.  Monitor any moles. Be sure to tell your health care provider: ? About any new moles or changes in moles, especially if there is a change in a mole's shape or color. ? If you have a mole that is larger than the size of a pencil eraser.  If any of your family members has a history of skin cancer, especially at a young age, talk with your health care provider about genetic screening.  Always use sunscreen. Apply sunscreen liberally and repeatedly throughout the day.  Whenever you are outside, protect yourself by wearing long sleeves, pants, a wide-brimmed hat, and sunglasses. What should I know about osteoporosis? Osteoporosis is a condition in which bone destruction happens more quickly than new bone creation. After menopause, you may be at an increased risk for osteoporosis. To help prevent osteoporosis or the bone fractures that can happen because of osteoporosis, the following is recommended:  If you are 59-59 years old, get at least 1,000 mg of calcium and at least 600 mg of vitamin D per day.  If you are older than age 36 but younger than age 32, get at least 1,200 mg of calcium and at least 600 mg of vitamin D per day.  If you are older than age 47, get at least 1,200 mg of calcium and at least 800 mg of vitamin D per day. Smoking and excessive alcohol intake increase the risk of osteoporosis. Eat foods that are rich in calcium and vitamin D, and do weight-bearing exercises several times each week as directed by your health care provider. What should I know about how menopause affects my mental health? Depression may occur at any age, but it is more common as you become older. Common symptoms of depression include:  Low or sad mood.   Changes in sleep patterns.  Changes in appetite or eating patterns.  Feeling an overall lack of motivation or enjoyment of activities that you previously enjoyed.  Frequent crying spells. Talk with your health care provider if you think that you are experiencing depression. What should I know about immunizations? It is important that you get and maintain your immunizations. These include:  Tetanus, diphtheria, and pertussis (Tdap) booster vaccine.  Influenza every year before the flu season begins.  Pneumonia vaccine.  Shingles vaccine. Your health care provider may also recommend other immunizations. This information is not intended to replace advice given to you by your health care provider. Make sure you discuss any questions you have with your health care provider. Document Released: 03/30/2005 Document Revised: 08/26/2015 Document Reviewed: 11/09/2014 Elsevier Interactive Patient Education  2019 [place].

## 2018-08-07 NOTE — Assessment & Plan Note (Addendum)
Here for medicare wellness/physical  Diet: heart healthy  Physical activity: not sedentary  Depression/mood screen: anxiety, denies depression Hearing: intact to whispered voice  Visual acuity: grossly normal w/glasses, performs annual eye exam  ADLs: capable - c/o dropping things, burning herself at times Fall risk: low - cane Home safety: good  Cognitive evaluation: intact to orientation, naming, recall and repetition  EOL planning: adv directives, full code/ I agree  I have personally reviewed and have noted  1. The patient's medical, surgical and social history  2. Their use of alcohol, tobacco or illicit drugs  3. Their current medications and supplements  4. The patient's functional ability including ADL's, fall risks, home safety risks and hearing or visual impairment.  5. Diet and physical activities  6. Evidence for depression or mood disorders 7. The roster of all physicians providing medical care to patient - is listed in the Snapshot section of the chart and reviewed today.    Today patient counseled on age appropriate routine health concerns for screening and prevention, each reviewed and up to date or declined. Immunizations reviewed and up to date or declined. Labs ordered and reviewed. Risk factors for depression reviewed and negative. Hearing function and visual acuity are intact. ADLs screened and addressed as needed. Functional ability and level of safety reviewed and appropriate. Education, counseling and referrals performed based on assessed risks today. Patient provided with a copy of personalized plan for preventive services.   PT/OT

## 2018-08-07 NOTE — Assessment & Plan Note (Signed)
Pt said [employer] said she needs to stop taking Xanax and Lexapro - pt stopped 2 mo ago. C/o anxiety attacks at times - worse Re-start Lexapro

## 2018-08-07 NOTE — Assessment & Plan Note (Signed)
OT/PT cane

## 2018-08-07 NOTE — Progress Notes (Signed)
Subjective:  Patient ID: Terri King, female    DOB: [birth year]  Age: 84 y.o. MRN: [id number]  CC: No chief complaint on file.   HPI Terri King presents for a well exam. Pt said [employer] said she needs to stop taking Xanax and Lexapro - pt stopped 2 mo ago. C/o anxiety attacks at times - worse No falls, c/o dropping things, burning herself at times  C/o R knee pain  Outpatient Medications Prior to Visit  Medication Sig Dispense Refill   ALPRAZolam (XANAX) 0.25 MG tablet Take 0.5-1 tablets (0.125-0.25 mg total) by mouth 2 (two) times daily as needed for anxiety. 120 tablet 1   amLODipine (NORVASC) 5 MG tablet TAKE 1 TABLET EVERY DAY 90 tablet 3   cholecalciferol (VITAMIN D) 400 UNITS TABS Take 400 Units by mouth daily.     denosumab (PROLIA) 60 MG/ML SOLN injection Inject 60 mg into the skin every 6 (six) months. Administer in upper arm, thigh, or abdomen 1 mL 0   escitalopram (LEXAPRO) 10 MG tablet TAKE 1 TABLET EVERY DAY 90 tablet 3   famotidine (PEPCID) 40 MG tablet Take 1 tablet (40 mg total) by mouth daily. 90 tablet 3   fluticasone furoate-vilanterol (BREO ELLIPTA) 100-25 MCG/INH AEPB Inhale 1 puff into the lungs daily. 1 each 5   gabapentin (NEURONTIN) 100 MG capsule TAKE 1 CAPSULE THREE TIMES DAILY 270 capsule 3   ibuprofen (IBU) 600 MG tablet TAKE 1 TABLET EVERY DAY AS NEEDED FOR  MODERATE  PAIN 90 tablet 1   Krill Oil (MAXIMUM RED KRILL) 300 MG CAPS 1 po qd 100 capsule 3   metoprolol succinate (TOPROL-XL) 50 MG 24 hr tablet TAKE 1 TABLET AT BEDTIME 90 tablet 3   Multiple Vitamins-Minerals (EYE VITAMINS PO) Take by mouth as directed.     Multiple Vitamins-Minerals (MULTIPLE VITAMINS/WOMENS PO) Take by mouth as directed.     Pitavastatin Calcium (LIVALO) 1 MG TABS Take 1 tablet (1 mg total) by mouth daily. 90 tablet 3   potassium chloride (KLOR-CON) 8 MEQ tablet TAKE 1 TABLET EVERY DAY. ANNUAL APPOINTMENT DUE IN MARCH, MUST SEE PROVIDER FOR FUTURE REFILLS  90 tablet 3   potassium chloride SA (K-DUR,KLOR-CON) 20 MEQ tablet TAKE 1 TABLET EVERY DAY 90 tablet 2   pravastatin (PRAVACHOL) 20 MG tablet Take 1 tablet (20 mg total) by mouth daily. 90 tablet 3   temazepam (RESTORIL) 30 MG capsule TAKE 1 CAPSULE AT BEDTIME AS NEEDED  FOR  SLEEP 90 capsule 1   triamcinolone ointment (KENALOG) 0.1 % Apply 1 application topically 2 (two) times daily. rash 30 g 1   triamterene-hydrochlorothiazide (MAXZIDE-25) 37.5-25 MG tablet TAKE 1 TABLET EVERY DAY 90 tablet 3   valACYclovir (VALTREX) 1000 MG tablet Take 1 tablet (1,000 mg total) by mouth 3 (three) times daily. 21 tablet 0   vitamin B-12 (CYANOCOBALAMIN) 100 MCG tablet Take 100 mcg by mouth daily.     Vitamin E 400 units TABS Take by mouth as directed.     No facility-administered medications prior to visit.     ROS: Review of Systems  Constitutional: Negative for activity change, appetite change, chills, fatigue and unexpected weight change.  HENT: Negative for congestion, mouth sores and sinus pressure.   Eyes: Negative for visual disturbance.  Respiratory: Negative for cough and chest tightness.   Gastrointestinal: Negative for abdominal pain and nausea.  Genitourinary: Negative for difficulty urinating, frequency and vaginal pain.  Musculoskeletal: Positive for arthralgias and gait problem. Negative  for back pain.  Skin: Negative for pallor and rash.  Neurological: Negative for dizziness, tremors, weakness, numbness and headaches.  Psychiatric/Behavioral: Negative for confusion, sleep disturbance and suicidal ideas.    Objective:  BP 140/82 (BP Location: Left Arm, Patient Position: Sitting, Cuff Size: Normal)    Pulse 73    Temp 98 F (36.7 C) (Oral)    Ht 5\' 4"  (1.626 m)    Wt 178 lb (80.7 kg)    SpO2 97%    BMI 30.55 kg/m   BP Readings from Last 3 Encounters:  08/07/18 140/82  11/13/17 132/78  08/13/17 132/80    Wt Readings from Last 3 Encounters:  08/07/18 178 lb (80.7 kg)    11/13/17 183 lb (83 kg)  08/13/17 183 lb (83 kg)    Physical Exam Constitutional:      General: She is not in acute distress.    Appearance: She is well-developed.  HENT:     Head: Normocephalic.     Right Ear: External ear normal.     Left Ear: External ear normal.     Nose: Nose normal.  Eyes:     General:        Right eye: No discharge.        Left eye: No discharge.     Conjunctiva/sclera: Conjunctivae normal.     Pupils: Pupils are equal, round, and reactive to light.  Neck:     Musculoskeletal: Normal range of motion and neck supple.     Thyroid: No thyromegaly.     Vascular: No JVD.     Trachea: No tracheal deviation.  Cardiovascular:     Rate and Rhythm: Normal rate and regular rhythm.     Heart sounds: Normal heart sounds.  Pulmonary:     Effort: No respiratory distress.     Breath sounds: No stridor. No wheezing.  Abdominal:     General: Bowel sounds are normal. There is no distension.     Palpations: Abdomen is soft. There is no mass.     Tenderness: There is no abdominal tenderness. There is no guarding or rebound.  Musculoskeletal:        General: Tenderness present.  Lymphadenopathy:     Cervical: No cervical adenopathy.  Skin:    Findings: No erythema or rash.  Neurological:     Cranial Nerves: No cranial nerve deficit.     Motor: Weakness present. No abnormal muscle tone.     Coordination: Coordination normal.     Gait: Gait abnormal.     Deep Tendon Reflexes: Reflexes normal.  Psychiatric:        Behavior: Behavior normal.        Thought Content: Thought content normal.        Judgment: Judgment normal.   R knee - pain   Procedure Note :    Procedure : Injection of the R  Knee joint  bursa pes anserine injection   Indication: Knee bursitis with refractory  chronic pain.   Risks including unsuccessful procedure , bleeding, infection, bruising, skin atrophy and others were explained to the patient in detail as well as the benefits. Informed  consent was obtained and signed.   Tthe patient was placed in a comfortable position. Skin was prepped with Betadine and alcohol  and anesthetized with a cooling spray. Then, a 3 cc syringe with a 1.5 inch long 25-gauge needle was used for a bursa injection in a fan-like fasion with 3 mL of 2% lidocaine and 40  mg of Depo-Medrol .  Band-Aid was applied.   Tolerated well. Complications: None. Good pain relief following the procedure.    Lab Results  Component Value Date   WBC 7.9 11/13/2017   HGB 14.2 11/13/2017   HCT 40.8 11/13/2017   PLT 273.0 11/13/2017   GLUCOSE 128 (H) 11/13/2017   CHOL 283 (H) 11/13/2017   TRIG 198.0 (H) 11/13/2017   HDL 57.70 11/13/2017   LDLDIRECT 186.1 03/24/2013   LDLCALC 185 (H) 11/13/2017   ALT 11 11/13/2017   AST 16 11/13/2017   NA 140 11/13/2017   K 3.8 11/13/2017   CL 103 11/13/2017   CREATININE 0.96 11/13/2017   BUN 17 11/13/2017   CO2 28 11/13/2017   TSH 1.51 11/13/2017   INR 1.44 01/25/2012   HGBA1C 6.1 05/10/2016    Dg Bone Density  Result Date: 10/05/2015 Date of study: 10/03/2015 Exam: DUAL X-RAY ABSORPTIOMETRY (DXA) FOR BONE MINERAL DENSITY (BMD) Instrument: [employer] Provider: PCP Indication: screening for osteoporosis Comparison: none (please note that it is not possible to compare data from different instruments) Clinical data: Pt is a 84 y.o. female with history of rib fracture. On vitamin D supplements. Results:  Lumbar spine (L2, L4) Femoral neck (FN) Change in BMD from previous DXA test (%) -2.9  RFN:-2.6 LFN:-1.8  (*) statistically significant Assessment: Patient has OSTEOPOROSIS according to the [facility] classification for osteoporosis (see below). Fracture risk: high Comments: the technical quality of the study is good, however, the spine is scoliotic and arthritic. Calcium accumulation in arthritic sites can confound the results of the bone density scan. L1 and L3 vertebrae had to be excluded from analysis. Evaluation  for secondary causes should be considered if clinically indicated. Recommend optimizing calcium (1200 mg/day) and vitamin D (800 IU/day). Followup: Repeat BMD is appropriate after 1-2 years WHO criteria for diagnosis of osteoporosis in postmenopausal women and in men 100 y/o or older: - normal: T-score -1.0 to + 1.0 - osteopenia/low bone density: T-score between -2.5 and -1.0 - osteoporosis: T-score below -2.5 - severe osteoporosis: T-score below -2.5 with history of fragility fracture Note: although not part of the WHO classification, the presence of a fragility fracture, regardless of the T-score, should be considered diagnostic of osteoporosis, provided other causes for the fracture have been excluded. Treatment: The National Osteoporosis Foundation recommends that treatment be considered in postmenopausal women and men age 47 or older with: 1. Hip or vertebral (clinical or morphometric) fracture 2. T-score of - 2.5 or lower at the spine or hip 3. 10-year fracture probability by FRAX of at least 20% for a major osteoporotic fracture and 3% for a hip fracture Philemon Kingdom, MD [facility]    Assessment & Plan:   There are no diagnoses linked to this encounter.   No orders of the defined types were placed in this encounter.    Follow-up: No follow-ups on file.  Walker Kehr, MD

## 2018-08-07 NOTE — Assessment & Plan Note (Signed)
OT/PT 

## 2018-08-07 NOTE — Assessment & Plan Note (Signed)
Temazepam prn   Potential benefits of a long term benzodiazepines  use as well as potential risks  and complications were explained to the patient and were aknowledged. 

## 2018-08-07 NOTE — Assessment & Plan Note (Signed)
Norvasc, ASA 81 

## 2018-09-16 ENCOUNTER — Telehealth: Payer: Self-pay | Admitting: Internal Medicine

## 2018-09-16 NOTE — Telephone Encounter (Signed)
Pt would like to know if provider would provide her with a Rx for her extra strength ensure so that her insurance will pay for it?    Pharmacy:  [place], [place] [phone] (Phone) [phone] (Fax)     [place]: [phone] - pt would like to confirmation

## 2018-09-17 MED ORDER — ENSURE COMPLETE PO LIQD: 237.0000 mL | ORAL | 11 refills | Status: DC

## 2018-09-17 NOTE — Telephone Encounter (Signed)
Printed rx signed and faxed to [facility]. Pt informed.

## 2018-09-17 NOTE — Telephone Encounter (Signed)
Ok Thx 

## 2018-09-24 DIAGNOSIS — Z1231 Encounter for screening mammogram for malignant neoplasm of breast: Secondary | ICD-10-CM | POA: Diagnosis not present

## 2018-09-24 DIAGNOSIS — Z803 Family history of malignant neoplasm of breast: Secondary | ICD-10-CM | POA: Diagnosis not present

## 2018-09-24 LAB — HM MAMMOGRAPHY

## 2018-09-25 ENCOUNTER — Encounter: Payer: Self-pay | Admitting: Internal Medicine

## 2018-10-01 ENCOUNTER — Other Ambulatory Visit: Payer: Self-pay | Admitting: Internal Medicine

## 2018-10-30 ENCOUNTER — Other Ambulatory Visit: Payer: Self-pay | Admitting: Internal Medicine

## 2018-10-31 ENCOUNTER — Other Ambulatory Visit: Payer: Self-pay

## 2018-11-03 MED ORDER — TEMAZEPAM 30 MG PO CAPS: ORAL_CAPSULE | ORAL | 1 refills | Status: DC

## 2018-11-10 ENCOUNTER — Other Ambulatory Visit: Payer: Self-pay

## 2018-11-10 ENCOUNTER — Encounter: Payer: Self-pay | Admitting: Internal Medicine

## 2018-11-10 ENCOUNTER — Ambulatory Visit (INDEPENDENT_AMBULATORY_CARE_PROVIDER_SITE_OTHER): Payer: Medicare HMO | Admitting: Internal Medicine

## 2018-11-10 VITALS — BP 144/66 | HR 67 | Temp 98.3°F | Ht 64.0 in | Wt 176.0 lb

## 2018-11-10 DIAGNOSIS — I2510 Atherosclerotic heart disease of native coronary artery without angina pectoris: Secondary | ICD-10-CM

## 2018-11-10 DIAGNOSIS — Z23 Encounter for immunization: Secondary | ICD-10-CM | POA: Diagnosis not present

## 2018-11-10 DIAGNOSIS — R269 Unspecified abnormalities of gait and mobility: Secondary | ICD-10-CM

## 2018-11-10 DIAGNOSIS — F5101 Primary insomnia: Secondary | ICD-10-CM

## 2018-11-10 DIAGNOSIS — I10 Essential (primary) hypertension: Secondary | ICD-10-CM | POA: Diagnosis not present

## 2018-11-10 NOTE — Patient Instructions (Signed)
If you have medicare related insurance (such as traditional Medicare, Blue Cross Medicare, United HealthCare Medicare, or similar), Please make an appointment at the scheduling desk with Jill, the Wellness Health Coach, for your Wellness visit in this office, which is a benefit with your insurance.  

## 2018-11-10 NOTE — Assessment & Plan Note (Signed)
ASA Norvasc

## 2018-11-10 NOTE — Progress Notes (Signed)
Subjective:  Patient ID: Terri King, female    DOB: 06/30/1934  Age: 84 y.o. MRN: [id number]  CC: No chief complaint on file.   HPI Terri King presents for knee OA, HTN, CAD f/u  Outpatient Medications Prior to Visit  Medication Sig Dispense Refill   amLODipine (NORVASC) 5 MG tablet TAKE 1 TABLET EVERY DAY 90 tablet 3   cholecalciferol (VITAMIN D) 400 UNITS TABS Take 400 Units by mouth daily.     denosumab (PROLIA) 60 MG/ML SOLN injection Inject 60 mg into the skin every 6 (six) months. Administer in upper arm, thigh, or abdomen 1 mL 0   escitalopram (LEXAPRO) 10 MG tablet Take 1 tablet (10 mg total) by mouth daily. 90 tablet 3   famotidine (PEPCID) 40 MG tablet Take 1 tablet (40 mg total) by mouth daily. 90 tablet 3   feeding supplement, ENSURE COMPLETE, (ENSURE COMPLETE) LIQD Take 237 mLs by mouth daily. 7110 mL 11   fluticasone furoate-vilanterol (BREO ELLIPTA) 100-25 MCG/INH AEPB Inhale 1 puff into the lungs daily. 1 each 5   gabapentin (NEURONTIN) 100 MG capsule TAKE 1 CAPSULE THREE TIMES DAILY 270 capsule 1   ibuprofen (IBU) 600 MG tablet TAKE 1 TABLET EVERY DAY AS NEEDED FOR  MODERATE  PAIN 90 tablet 1   Krill Oil (MAXIMUM RED KRILL) 300 MG CAPS 1 po qd 100 capsule 3   metoprolol succinate (TOPROL-XL) 50 MG 24 hr tablet TAKE 1 TABLET AT BEDTIME 90 tablet 3   Multiple Vitamins-Minerals (EYE VITAMINS PO) Take by mouth as directed.     Multiple Vitamins-Minerals (MULTIPLE VITAMINS/WOMENS PO) Take by mouth as directed.     Pitavastatin Calcium (LIVALO) 1 MG TABS Take 1 tablet (1 mg total) by mouth daily. 90 tablet 3   potassium chloride (KLOR-CON) 8 MEQ tablet TAKE 1 TABLET EVERY DAY. ANNUAL APPOINTMENT DUE IN MARCH, MUST SEE PROVIDER FOR FUTURE REFILLS 90 tablet 3   potassium chloride SA (K-DUR,KLOR-CON) 20 MEQ tablet TAKE 1 TABLET EVERY DAY 90 tablet 2   pravastatin (PRAVACHOL) 20 MG tablet TAKE 1 TABLET EVERY DAY 90 tablet 3   temazepam (RESTORIL) 30 MG  capsule TAKE 1 CAPSULE AT BEDTIME AS NEEDED  FOR  SLEEP 90 capsule 1   triamcinolone ointment (KENALOG) 0.1 % Apply 1 application topically 2 (two) times daily. rash 30 g 1   triamterene-hydrochlorothiazide (MAXZIDE-25) 37.5-25 MG tablet TAKE 1 TABLET EVERY DAY 90 tablet 3   valACYclovir (VALTREX) 1000 MG tablet Take 1 tablet (1,000 mg total) by mouth 3 (three) times daily. 21 tablet 0   vitamin B-12 (CYANOCOBALAMIN) 100 MCG tablet Take 100 mcg by mouth daily.     Vitamin E 400 units TABS Take by mouth as directed.     No facility-administered medications prior to visit.     ROS: Review of Systems  Constitutional: Positive for fatigue. Negative for activity change, appetite change, chills and unexpected weight change.  HENT: Negative for congestion, mouth sores and sinus pressure.   Eyes: Negative for visual disturbance.  Respiratory: Negative for cough and chest tightness.   Gastrointestinal: Negative for abdominal pain and nausea.  Genitourinary: Negative for difficulty urinating, frequency and vaginal pain.  Musculoskeletal: Positive for arthralgias and gait problem. Negative for back pain.  Skin: Negative for pallor and rash.  Neurological: Negative for dizziness, tremors, weakness, numbness and headaches.  Psychiatric/Behavioral: Positive for sleep disturbance. Negative for confusion and suicidal ideas.    Objective:  BP (!) 144/66 (BP Location: Left  Arm, Patient Position: Sitting, Cuff Size: Large)    Pulse 67    Temp 98.3 F (36.8 C) (Oral)    Ht 5\' 4"  (1.626 m)    Wt 176 lb (79.8 kg)    SpO2 99%    BMI 30.21 kg/m   BP Readings from Last 3 Encounters:  11/10/18 (!) 144/66  08/07/18 140/82  11/13/17 132/78    Wt Readings from Last 3 Encounters:  11/10/18 176 lb (79.8 kg)  08/07/18 178 lb (80.7 kg)  11/13/17 183 lb (83 kg)    Physical Exam Constitutional:      General: She is not in acute distress.    Appearance: She is well-developed.  HENT:     Head:  Normocephalic.     Right Ear: External ear normal.     Left Ear: External ear normal.     Nose: Nose normal.  Eyes:     General:        Right eye: No discharge.        Left eye: No discharge.     Conjunctiva/sclera: Conjunctivae normal.     Pupils: Pupils are equal, round, and reactive to light.  Neck:     Musculoskeletal: Normal range of motion and neck supple.     Thyroid: No thyromegaly.     Vascular: No JVD.     Trachea: No tracheal deviation.  Cardiovascular:     Rate and Rhythm: Normal rate and regular rhythm.     Heart sounds: Normal heart sounds.  Pulmonary:     Effort: No respiratory distress.     Breath sounds: No stridor. No wheezing.  Abdominal:     General: Bowel sounds are normal. There is no distension.     Palpations: Abdomen is soft. There is no mass.     Tenderness: There is no abdominal tenderness. There is no guarding or rebound.  Musculoskeletal:        General: No tenderness.  Lymphadenopathy:     Cervical: No cervical adenopathy.  Skin:    Findings: No erythema or rash.  Neurological:     Mental Status: She is oriented to person, place, and time.     Cranial Nerves: No cranial nerve deficit.     Motor: No abnormal muscle tone.     Coordination: Coordination abnormal.     Gait: Gait abnormal.     Deep Tendon Reflexes: Reflexes normal.  Psychiatric:        Behavior: Behavior normal.        Thought Content: Thought content normal.        Judgment: Judgment normal.   cane   Lab Results  Component Value Date   WBC 8.4 08/07/2018   HGB 13.5 08/07/2018   HCT 39.3 08/07/2018   PLT 278.0 08/07/2018   GLUCOSE 129 (H) 08/07/2018   CHOL 254 (H) 08/07/2018   TRIG 190.0 (H) 08/07/2018   HDL 55.50 08/07/2018   LDLDIRECT 186.1 03/24/2013   LDLCALC 160 (H) 08/07/2018   ALT 10 08/07/2018   AST 18 08/07/2018   NA 137 08/07/2018   K 3.5 08/07/2018   CL 101 08/07/2018   CREATININE 0.89 08/07/2018   BUN 12 08/07/2018   CO2 26 08/07/2018   TSH 1.84  08/07/2018   INR 1.44 01/25/2012   HGBA1C 6.1 05/10/2016    Dg Bone Density  Result Date: 10/05/2015 Date of study: 10/03/2015 Exam: DUAL X-RAY ABSORPTIOMETRY (DXA) FOR BONE MINERAL DENSITY (BMD) Instrument: [employer] Requesting Provider: PCP Indication:  screening for osteoporosis Comparison: none (please note that it is not possible to compare data from different instruments) Clinical data: Pt is a 84 y.o. female with history of rib fracture. On vitamin D supplements. Results:  Lumbar spine (L2, L4) Femoral neck (FN) Change in BMD from previous DXA test (%) -2.9  RFN:-2.6 LFN:-1.8  (*) statistically significant Assessment: Patient has OSTEOPOROSIS according to the [facility] classification for osteoporosis (see below). Fracture risk: high Comments: the technical quality of the study is good, however, the spine is scoliotic and arthritic. Calcium accumulation in arthritic sites can confound the results of the bone density scan. L1 and L3 vertebrae had to be excluded from analysis. Evaluation for secondary causes should be considered if clinically indicated. Recommend optimizing calcium (1200 mg/day) and vitamin D (800 IU/day). Followup: Repeat BMD is appropriate after 1-2 years WHO criteria for diagnosis of osteoporosis in postmenopausal women and in men 82 y/o or older: - normal: T-score -1.0 to + 1.0 - osteopenia/low bone density: T-score between -2.5 and -1.0 - osteoporosis: T-score below -2.5 - severe osteoporosis: T-score below -2.5 with history of fragility fracture Note: although not part of the WHO classification, the presence of a fragility fracture, regardless of the T-score, should be considered diagnostic of osteoporosis, provided other causes for the fracture have been excluded. Treatment: The National Osteoporosis Foundation recommends that treatment be considered in postmenopausal women and men age 82 or older with: 1. Hip or vertebral (clinical or morphometric) fracture 2. T-score of - 2.5  or lower at the spine or hip 3. 10-year fracture probability by FRAX of at least 20% for a major osteoporotic fracture and 3% for a hip fracture Carlus Pavlovristina Gherghe, MD  Endocrinology    Assessment & Plan:   There are no diagnoses linked to this encounter.   No orders of the defined types were placed in this encounter.    Follow-up: No follow-ups on file.  Sonda PrimesAlex Jemila Camille, MD

## 2018-11-10 NOTE — Assessment & Plan Note (Signed)
Cane

## 2018-11-10 NOTE — Assessment & Plan Note (Signed)
Temazepam at hs prn - not w/Xanax  Potential benefits of a long term benzodiazepines  use as well as potential risks  and complications were explained to the patient and were aknowledged. 

## 2018-11-10 NOTE — Assessment & Plan Note (Signed)
On Pravastatin and ASA, Norvasc 

## 2018-11-11 NOTE — Addendum Note (Signed)
Addended by: Karren Cobble on: 11/11/2018 01:23 PM   Modules accepted: Orders

## 2018-11-28 ENCOUNTER — Other Ambulatory Visit: Payer: Self-pay | Admitting: Internal Medicine

## 2018-12-04 ENCOUNTER — Other Ambulatory Visit: Payer: Self-pay | Admitting: Internal Medicine

## 2018-12-15 ENCOUNTER — Other Ambulatory Visit: Payer: Self-pay | Admitting: Internal Medicine

## 2019-01-13 ENCOUNTER — Other Ambulatory Visit: Payer: Self-pay | Admitting: Internal Medicine

## 2019-01-13 MED ORDER — GABAPENTIN 100 MG PO CAPS: ORAL_CAPSULE | ORAL | 1 refills | Status: DC

## 2019-01-13 MED ORDER — ESCITALOPRAM OXALATE 10 MG PO TABS: 10.0000 mg | ORAL_TABLET | Freq: Every day | ORAL | 1 refills | Status: DC

## 2019-01-13 MED ORDER — PRAVASTATIN SODIUM 20 MG PO TABS: 20.0000 mg | ORAL_TABLET | Freq: Every day | ORAL | 1 refills | Status: DC

## 2019-01-13 NOTE — Telephone Encounter (Signed)
Medication Refill - Medication: escitalopram (LEXAPRO) 10 MG tablet and gabapentin (NEURONTIN) 100 MG capsule and pravastatin (PRAVACHOL) 20 MG tablet    Preferred Pharmacy (with phone number or street name):  [facility] [phone] - [place], [place] - [address] [phone] (Phone) [phone] (Fax)     Agent: Please be advised that RX refills may take up to 3 business days. We ask that you follow-up with your pharmacy.

## 2019-02-15 ENCOUNTER — Other Ambulatory Visit: Payer: Self-pay | Admitting: Internal Medicine

## 2019-03-13 ENCOUNTER — Ambulatory Visit: Payer: Medicare HMO | Admitting: Internal Medicine

## 2019-03-18 ENCOUNTER — Other Ambulatory Visit: Payer: Self-pay | Admitting: Internal Medicine

## 2019-03-19 NOTE — Progress Notes (Signed)
Cardiology Office Note:    Date:  03/20/2019   ID:  Terri King, DOB [birth year], MRN [id number]  PCP:  Cassandria Anger, MD  Cardiologist:  Dorris Carnes, MD  Electrophysiologist:  None   Referring MD: Cassandria Anger, MD   Chief Complaint:  Follow-up (Aortic Stenosis)    Patient Profile:    Terri King is a 85 y.o. female with:   Aortic stenosis  Echocardiogram 9/18: mod AS, mean 24 mmHg  Echocardiogram 6/17: mod AS, mean 33 mmHg  Hypertension   Hyperlipidemia   Normal coronary arteries in [place] 06/2011: EF 76, no ischemia   Prior CV studies:  Echocardiogram 11/09/2016 Mod conc LVH, EF 60-65, no RWMA, Gr 2 DD, mod AS (mean 24 mmHg, peak 44 mmHg), trivial AI, MAC, trivial Mr, mild LAE  History of Present Illness:    Terri King returns for follow-up.  She is here alone.  She has dyspnea with some activities.  She has significant knee arthritis which limits her activities.  She has not had syncope, orthopnea, leg swelling.  She has not had any chest discomfort.  She does note that she used to have nitroglycerin on hand in case she needed but has lost it.  She asked for a refill.    Past Medical History:  Diagnosis Date  . CAD (coronary artery disease)    DR Harrington Challenger; by notes normal cath in 1986, normal stress 2013  . Family history of anesthesia complication    NIENCE had sensitivity  . History of pneumonia 2008  . HTN (hypertension)    dr Alain Marion  . Hyperlipemia   . MI (myocardial infarction) ([place])    DR Harrington Challenger  . Osteoarthritis   . Shortness of breath     Current Medications: Current Meds  Medication Sig  . amLODipine (NORVASC) 5 MG tablet TAKE 1 TABLET EVERY DAY  . Ascorbic Acid (VITAMIN C PO) Take 1 tablet by mouth daily.  . cholecalciferol (VITAMIN D) 400 UNITS TABS Take 400 Units by mouth daily.  Marland Kitchen escitalopram (LEXAPRO) 10 MG tablet Take 1 tablet (10 mg total) by mouth daily.  . famotidine (PEPCID) 40 MG tablet Take 1 tablet (40  mg total) by mouth daily.  . feeding supplement, ENSURE COMPLETE, (ENSURE COMPLETE) LIQD Take 237 mLs by mouth daily.  Marland Kitchen gabapentin (NEURONTIN) 100 MG capsule TAKE 1 CAPSULE THREE TIMES DAILY  . ibuprofen (IBU) 600 MG tablet TAKE 1 TABLET EVERY DAY AS NEEDED FOR MODERATE PAIN  . Krill Oil (MAXIMUM RED KRILL) 300 MG CAPS 1 po qd  . metoprolol succinate (TOPROL-XL) 50 MG 24 hr tablet TAKE 1 TABLET AT BEDTIME  . Multiple Vitamins-Minerals (MULTIPLE VITAMINS/WOMENS PO) Take by mouth daily.   . potassium chloride SA (K-DUR,KLOR-CON) 20 MEQ tablet TAKE 1 TABLET EVERY DAY  . pravastatin (PRAVACHOL) 20 MG tablet Take 1 tablet (20 mg total) by mouth daily.  . temazepam (RESTORIL) 30 MG capsule TAKE 1 CAPSULE AT BEDTIME AS NEEDED  FOR  SLEEP  . triamterene-hydrochlorothiazide (MAXZIDE-25) 37.5-25 MG tablet TAKE 1 TABLET EVERY DAY  . valACYclovir (VALTREX) 1000 MG tablet Take 1 tablet (1,000 mg total) by mouth 3 (three) times daily.  . vitamin B-12 (CYANOCOBALAMIN) 100 MCG tablet Take 100 mcg by mouth daily.  . Vitamin E 400 units TABS Take by mouth daily.   . [DISCONTINUED] Pitavastatin Calcium (LIVALO) 1 MG TABS Take 1 tablet (1 mg total) by mouth daily.     Allergies:   Codeine,  Crestor [rosuvastatin], Dexilant [dexlansoprazole], Ibuprofen, Simvastatin, Tylenol [acetaminophen], and Iodine   Social History   Tobacco Use  . Smoking status: Never Smoker  . Smokeless tobacco: Never Used  Substance Use Topics  . Alcohol use: No  . Drug use: No     Family Hx: The patient's family history includes Glaucoma in her father; Hypertension in her mother and another family member.  Review of Systems  Gastrointestinal: Negative for hematochezia and melena.  Genitourinary: Negative for hematuria.     EKGs/Labs/Other Test Reviewed:    EKG:  EKG is  ordered today.  The ekg ordered today demonstrates normal sinus rhythm, heart rate 64, normal axis, nonspecific ST-T wave changes, QTC 416, no change from  prior tracing  Recent Labs: 08/07/2018: ALT 10; BUN 12; Creatinine, Ser 0.89; Hemoglobin 13.5; Platelets 278.0; Potassium 3.5; Sodium 137; TSH 1.84   Recent Lipid Panel Lab Results  Component Value Date/Time   CHOL 254 (H) 08/07/2018 09:21 AM   TRIG 190.0 (H) 08/07/2018 09:21 AM   HDL 55.50 08/07/2018 09:21 AM   CHOLHDL 5 08/07/2018 09:21 AM   LDLCALC 160 (H) 08/07/2018 09:21 AM   LDLDIRECT 186.1 03/24/2013 09:23 AM     Physical Exam:    VS:  BP 128/76   Pulse 72   Ht 5' 3.5" (1.613 m)   Wt 174 lb 6.4 oz (79.1 kg)   SpO2 98%   BMI 30.41 kg/m     Wt Readings from Last 3 Encounters:  03/20/19 174 lb 6.4 oz (79.1 kg)  11/10/18 176 lb (79.8 kg)  08/07/18 178 lb (80.7 kg)     Constitutional:      Appearance: Healthy appearance. Not in distress.  Neck:     Thyroid: Thyroid normal.     Vascular: JVD normal.  Pulmonary:     Effort: Pulmonary effort is normal.     Breath sounds: No wheezing. No rales.  Cardiovascular:     Normal rate. Regular rhythm. Normal S1. Normal S2.     Murmurs: There is a grade 3/6 crescendo-decrescendo systolic murmur at the URSB.  Abdominal:     Palpations: Abdomen is soft. There is no hepatomegaly.  Skin:    General: Skin is warm and dry.  Neurological:     Mental Status: Alert and oriented to person, place and time.     Cranial Nerves: Cranial nerves are intact.      ASSESSMENT & PLAN:    1. Aortic stenosis, moderate Echocardiogram in 2018 with mean gradient 24 mmHg.  She is not really having symptoms to suggest severe critical aortic stenosis.  However, since it has been over 2 years since her last study, I will arrange a repeat echocardiogram to reassess her aortic stenosis.  Follow-up 1 year.  2. Essential hypertension The patient's blood pressure is controlled on her current regimen.  Continue current therapy. .  3. Hyperlipidemia, unspecified hyperlipidemia type Most recent LDL 160.  She has had intolerances to multiple statins in  the past.  We discussed whether or not she would like to see the lipid clinic for further management.  She would like to pursue this.  -Refer to lipid clinic.   Dispo:  Return in about 1 year (around 03/19/2020) for Routine Follow Up, w/ Dr. Harrington Challenger, or Richardson Dopp, PA-C, in person.   Medication Adjustments/Labs and Tests Ordered: Current medicines are reviewed at length with the patient today.  Concerns regarding medicines are outlined above.  Tests Ordered: Orders Placed This Encounter  Procedures  .  AMB Referral to [facility]  . EKG 12-Lead  . ECHOCARDIOGRAM COMPLETE   Medication Changes: Meds ordered this encounter  Medications  . nitroGLYCERIN (NITROSTAT) 0.4 MG SL tablet    Sig: Place 1 tablet (0.4 mg total) under the tongue every 5 (five) minutes as needed for chest pain.    Dispense:  25 tablet    Refill:  6    Signed, Richardson Dopp, PA-C  03/20/2019 11:48 AM    [address], [place], [place]  [address] Phone: [phone]; Fax: [phone]

## 2019-03-20 ENCOUNTER — Encounter: Payer: Self-pay | Admitting: Physician Assistant

## 2019-03-20 ENCOUNTER — Ambulatory Visit: Payer: Medicare HMO | Admitting: Physician Assistant

## 2019-03-20 ENCOUNTER — Other Ambulatory Visit: Payer: Self-pay

## 2019-03-20 VITALS — BP 128/76 | HR 72 | Ht 63.5 in | Wt 174.4 lb

## 2019-03-20 DIAGNOSIS — I350 Nonrheumatic aortic (valve) stenosis: Secondary | ICD-10-CM

## 2019-03-20 DIAGNOSIS — I10 Essential (primary) hypertension: Secondary | ICD-10-CM

## 2019-03-20 DIAGNOSIS — E785 Hyperlipidemia, unspecified: Secondary | ICD-10-CM

## 2019-03-20 MED ORDER — NITROGLYCERIN 0.4 MG SL SUBL: 0.4000 mg | SUBLINGUAL_TABLET | SUBLINGUAL | 6 refills | Status: AC | PRN

## 2019-03-20 NOTE — Patient Instructions (Signed)
Medication Instructions:   Your physician has recommended you make the following change in your medication:   1) Stop Pitavastatin  *If you need a refill on your cardiac medications before your next appointment, please call your pharmacy*  Lab Work:  None ordered today  If you have labs (blood work) drawn today and your tests are completely normal, you will receive your results only by: Marland Kitchen MyChart Message (if you have MyChart) OR . A paper copy in the mail If you have any lab test that is abnormal or we need to change your treatment, we will call you to review the results.  Testing/Procedures:  Your physician has requested that you have an echocardiogram. Echocardiography is a painless test that uses sound waves to create images of your heart. It provides your doctor with information about the size and shape of your heart and how well your heart's chambers and valves are working. This procedure takes approximately one hour. There are no restrictions for this procedure.  Follow-Up: At [facility], you and your health needs are our priority.  As part of our continuing mission to provide you with exceptional heart care, we have created designated Provider Care Teams.  These Care Teams include your primary Cardiologist (physician) and Advanced Practice Providers (APPs -  Physician Assistants and Nurse Practitioners) who all work together to provide you with the care you need, when you need it.  Your next appointment:   12 month(s)  The format for your next appointment:   In Person  Provider:   You may see Dietrich Pates, MD or one of the following Advanced Practice Providers on your designated Care Team:    Tereso Newcomer, PA-C  Vin Keystone, [place]  Berton Bon, NP   Other Instructions  I have sent a prescription in for nitroglycerin to take as needed for chest pain.

## 2019-04-08 ENCOUNTER — Other Ambulatory Visit: Payer: Self-pay

## 2019-04-08 ENCOUNTER — Ambulatory Visit (HOSPITAL_COMMUNITY): Payer: Medicare HMO | Attending: Physician Assistant

## 2019-04-08 ENCOUNTER — Ambulatory Visit (INDEPENDENT_AMBULATORY_CARE_PROVIDER_SITE_OTHER): Payer: Medicare HMO | Admitting: Pharmacist

## 2019-04-08 DIAGNOSIS — E782 Mixed hyperlipidemia: Secondary | ICD-10-CM

## 2019-04-08 DIAGNOSIS — G720 Drug-induced myopathy: Secondary | ICD-10-CM | POA: Diagnosis not present

## 2019-04-08 DIAGNOSIS — I350 Nonrheumatic aortic (valve) stenosis: Secondary | ICD-10-CM | POA: Diagnosis not present

## 2019-04-08 DIAGNOSIS — T466X5A Adverse effect of antihyperlipidemic and antiarteriosclerotic drugs, initial encounter: Secondary | ICD-10-CM | POA: Insufficient documentation

## 2019-04-08 MED ORDER — REPATHA SURECLICK 140 MG/ML ~~LOC~~ SOAJ: 1.0000 "pen " | SUBCUTANEOUS | 3 refills | Status: DC

## 2019-04-08 NOTE — Progress Notes (Signed)
Patient ID: [name]                 DOB: [birth year]                    MRN: [id number]     HPI: Terri King is a 85 y.o. female patient of Dr Tenny Craw referred to lipid clinic by Tereso Newcomer, PA. PMH is significant for CAD s/p MI, aortic stenosis, HTN, and HLD. Pt has a history of statin intolerance and was referred to lipid clinic for further management, also has echo scheduled for today.  PT presents today in good spirits with her son. She stopped taking her pravastatin after her last visit with Lorin Picket and is feeling better. With each statin she has tried, she will feel nauseous, lightheaded, and have joint/muscle pain. These have improved with statin discontinuation in each case.  Current Medications: pravastatin 20mg  daily Intolerances: rosuvastatin 5mg  and 10mg  daily, simvastatin, atorvastatin 10mg  daily, Livalo 1mg  daily - myalgias, nausea, lightheadedness Risk Factors: CAD s/p MI, age, HTN LDL goal: 70mg /dL  Diet: Eats 2 meals a day - green vegetables, potatoes or rice, and chicken. 2 Ensures per day - instructed by her PCP  Exercise: Minimal, ambulates with a cane, had knee surgery  Family History: The patient's family history includes Glaucoma in her father; Hypertension in her mother and another family member.  Social History: No tobacco, alcohol, or illicit drug use.  Labs: 08/07/18: TC 254, TG 190, HDL 55.5, LDL 160  Past Medical History:  Diagnosis Date  . CAD (coronary artery disease)    DR ; by notes normal cath in 1986, normal stress 2013  . Family history of anesthesia complication    NIENCE had sensitivity  . History of pneumonia 2008  . HTN (hypertension)    dr 08/09/18  . Hyperlipemia   . MI (myocardial infarction) ([facility])    DR Tenny Craw  . Osteoarthritis   . Shortness of breath     Current Outpatient Medications on File Prior to Visit  Medication Sig Dispense Refill  . amLODipine (NORVASC) 5 MG tablet TAKE 1 TABLET EVERY DAY 90 tablet 3  .  Ascorbic Acid (VITAMIN C PO) Take 1 tablet by mouth daily.    . cholecalciferol (VITAMIN D) 400 UNITS TABS Take 400 Units by mouth daily.    1987 escitalopram (LEXAPRO) 10 MG tablet Take 1 tablet (10 mg total) by mouth daily. 90 tablet 1  . famotidine (PEPCID) 40 MG tablet Take 1 tablet (40 mg total) by mouth daily. 90 tablet 3  . feeding supplement, ENSURE COMPLETE, (ENSURE COMPLETE) LIQD Take 237 mLs by mouth daily. 7110 mL 11  . gabapentin (NEURONTIN) 100 MG capsule TAKE 1 CAPSULE THREE TIMES DAILY 270 capsule 1  . ibuprofen (IBU) 600 MG tablet TAKE 1 TABLET EVERY DAY AS NEEDED FOR MODERATE PAIN 90 tablet 1  . Krill Oil (MAXIMUM RED KRILL) 300 MG CAPS 1 po qd 100 capsule 3  . metoprolol succinate (TOPROL-XL) 50 MG 24 hr tablet TAKE 1 TABLET AT BEDTIME 90 tablet 3  . Multiple Vitamins-Minerals (MULTIPLE VITAMINS/WOMENS PO) Take by mouth daily.     . nitroGLYCERIN (NITROSTAT) 0.4 MG SL tablet Place 1 tablet (0.4 mg total) under the tongue every 5 (five) minutes as needed for chest pain. 25 tablet 6  . potassium chloride SA (K-DUR,KLOR-CON) 20 MEQ tablet TAKE 1 TABLET EVERY DAY 90 tablet 2  . pravastatin (PRAVACHOL) 20 MG tablet Take 1 tablet (20  mg total) by mouth daily. 90 tablet 1  . temazepam (RESTORIL) 30 MG capsule TAKE 1 CAPSULE AT BEDTIME AS NEEDED  FOR  SLEEP 90 capsule 1  . triamterene-hydrochlorothiazide (MAXZIDE-25) 37.5-25 MG tablet TAKE 1 TABLET EVERY DAY 90 tablet 3  . valACYclovir (VALTREX) 1000 MG tablet Take 1 tablet (1,000 mg total) by mouth 3 (three) times daily. 21 tablet 0  . vitamin B-12 (CYANOCOBALAMIN) 100 MCG tablet Take 100 mcg by mouth daily.    . Vitamin E 400 units TABS Take by mouth daily.      No current facility-administered medications on file prior to visit.    Allergies  Allergen Reactions  . Codeine Other (See Comments)    Abnormal behavior  . Crestor [Rosuvastatin]     Myalgias   . Dexilant [Dexlansoprazole]     Made me sick  . Ibuprofen Nausea And  Vomiting  . Simvastatin     REACTION: achy  . Tylenol [Acetaminophen]   . Iodine Swelling and Rash    Assessment/Plan:  1. Hyperlipidemia - Baseline LDL 160 above goal < 70 due to hx of ASCVD. Pt is intolerant to 4 statins including lowest starting dose. Discussed expected benefits, injection technique, and side effects. Prior authorization for Repatha 140mg  Q2W submitted and approved through 10/05/19. [employer] grant submitted and approved for $2500 in copay assistance for the next 12 months. She will ask her PCP to check lipid panel at 3/25 appt.   Terri King, PharmD, BCACP, [facility] [address] [place], [place] [address] Phone: [phone]; Fax: [phone] 04/08/2019 11:24 AM

## 2019-04-08 NOTE — Patient Instructions (Addendum)
It was nice to meet you today  Your LDL is 160 and your goal is less than 70  Please start Repatha injections every 2 weeks into the fatty tissue of your stomach or outer thigh. Keep the medication stored in the fridge. It will lower your LDL cholesterol by about 60%  I will apply for a grant through the [facility] to cover the remainder of the cost of the Repatha shots  Have your primary care doctor check your fasting cholesterol when you see them at the end of March  Call [facility], Pharmacist with any concerns #[phone]

## 2019-04-09 ENCOUNTER — Telehealth: Payer: Self-pay | Admitting: Internal Medicine

## 2019-04-09 ENCOUNTER — Encounter: Payer: Self-pay | Admitting: Physician Assistant

## 2019-04-09 DIAGNOSIS — I350 Nonrheumatic aortic (valve) stenosis: Secondary | ICD-10-CM

## 2019-04-09 NOTE — Telephone Encounter (Signed)
New Message   Pt is calling back for Echo results    Please call

## 2019-04-09 NOTE — Telephone Encounter (Signed)
-----   Message from Beatrice Lecher, [place] sent at 04/09/2019 10:20 AM EST ----- Please call the patient. The echocardiogram shows normal EF.  Aortic stenosis is moderate.  There is also moderate aortic insufficiency.  Findings are stable when compared to echocardiogram from 2018.   PLAN:   - Repeat Echocardiogram in 1 year.  - Continue current medications.   - Send copy to PCP Tereso Newcomer, PA-C    04/09/2019 10:11 AM

## 2019-04-09 NOTE — Telephone Encounter (Signed)
The patient has been notified of the result and verbalized understanding.  All questions (if any) were answered. Results faxed to PCP Lajoyce Corners, [facility] 04/09/2019 3:41 PM

## 2019-04-09 NOTE — Telephone Encounter (Signed)
Pt has been notified of echo results by phone with verbal understanding. Pt is agreeable to plan of care to repeat echo in 1 yr. Pt thanked me for the call. Order placed though no appt has been made. Patient notified of result.  Please refer to phone note from today for complete details.   Elke Holtry, [facility], [place] 04/09/2019 3:39 PM

## 2019-04-15 ENCOUNTER — Other Ambulatory Visit: Payer: Self-pay | Admitting: Internal Medicine

## 2019-04-27 ENCOUNTER — Other Ambulatory Visit: Payer: Self-pay | Admitting: Internal Medicine

## 2019-04-27 NOTE — Telephone Encounter (Signed)
    1.Medication Requested: temazepam (RESTORIL) 30 MG capsule  2. Pharmacy (Name, Street, [place]): [facility]  3. On Med List: yes  4. Last Visit with PCP: 11/10/18  5. Next visit date with PCP: 05/14/19   Agent: Please be advised that RX refills may take up to 3 business days. We ask that you follow-up with your pharmacy.

## 2019-04-29 MED ORDER — TEMAZEPAM 30 MG PO CAPS: ORAL_CAPSULE | ORAL | 1 refills | Status: DC

## 2019-05-14 ENCOUNTER — Ambulatory Visit: Payer: Medicare HMO

## 2019-05-14 ENCOUNTER — Ambulatory Visit (INDEPENDENT_AMBULATORY_CARE_PROVIDER_SITE_OTHER): Payer: Medicare HMO | Admitting: Internal Medicine

## 2019-05-14 ENCOUNTER — Encounter: Payer: Self-pay | Admitting: Internal Medicine

## 2019-05-14 ENCOUNTER — Other Ambulatory Visit: Payer: Self-pay

## 2019-05-14 VITALS — BP 118/78 | HR 60 | Temp 99.0°F | Resp 16 | Ht 64.0 in | Wt 169.4 lb

## 2019-05-14 DIAGNOSIS — R739 Hyperglycemia, unspecified: Secondary | ICD-10-CM | POA: Diagnosis not present

## 2019-05-14 DIAGNOSIS — I10 Essential (primary) hypertension: Secondary | ICD-10-CM | POA: Diagnosis not present

## 2019-05-14 DIAGNOSIS — R269 Unspecified abnormalities of gait and mobility: Secondary | ICD-10-CM

## 2019-05-14 DIAGNOSIS — F4323 Adjustment disorder with mixed anxiety and depressed mood: Secondary | ICD-10-CM | POA: Diagnosis not present

## 2019-05-14 DIAGNOSIS — E782 Mixed hyperlipidemia: Secondary | ICD-10-CM

## 2019-05-14 DIAGNOSIS — Z0000 Encounter for general adult medical examination without abnormal findings: Secondary | ICD-10-CM

## 2019-05-14 LAB — CBC WITH DIFFERENTIAL/PLATELET
Basophils Absolute: 0 10*3/uL (ref 0.0–0.1)
Basophils Relative: 0.8 % (ref 0.0–3.0)
Eosinophils Absolute: 0.3 10*3/uL (ref 0.0–0.7)
Eosinophils Relative: 5.4 % — ABNORMAL HIGH (ref 0.0–5.0)
HCT: 40.9 % (ref 36.0–46.0)
Hemoglobin: 14 g/dL (ref 12.0–15.0)
Lymphocytes Relative: 21.3 % (ref 12.0–46.0)
Lymphs Abs: 1.3 10*3/uL (ref 0.7–4.0)
MCHC: 34.2 g/dL (ref 30.0–36.0)
MCV: 92 fl (ref 78.0–100.0)
Monocytes Absolute: 0.7 10*3/uL (ref 0.1–1.0)
Monocytes Relative: 11.3 % (ref 3.0–12.0)
Neutro Abs: 3.9 10*3/uL (ref 1.4–7.7)
Neutrophils Relative %: 61.2 % (ref 43.0–77.0)
Platelets: 254 10*3/uL (ref 150.0–400.0)
RBC: 4.45 Mil/uL (ref 3.87–5.11)
RDW: 13.5 % (ref 11.5–15.5)
WBC: 6.3 10*3/uL (ref 4.0–10.5)

## 2019-05-14 LAB — URINALYSIS
Bilirubin Urine: NEGATIVE
Hgb urine dipstick: NEGATIVE
Ketones, ur: NEGATIVE
Leukocytes,Ua: NEGATIVE
Nitrite: NEGATIVE
Specific Gravity, Urine: 1.025 (ref 1.000–1.030)
Total Protein, Urine: NEGATIVE
Urine Glucose: NEGATIVE
Urobilinogen, UA: 0.2 (ref 0.0–1.0)
pH: 5.5 (ref 5.0–8.0)

## 2019-05-14 LAB — TSH: TSH: 1.24 u[IU]/mL (ref 0.35–4.50)

## 2019-05-14 LAB — HEPATIC FUNCTION PANEL
ALT: 15 U/L (ref 0–35)
AST: 21 U/L (ref 0–37)
Albumin: 4.2 g/dL (ref 3.5–5.2)
Alkaline Phosphatase: 96 U/L (ref 39–117)
Bilirubin, Direct: 0.2 mg/dL (ref 0.0–0.3)
Total Bilirubin: 0.9 mg/dL (ref 0.2–1.2)
Total Protein: 7.5 g/dL (ref 6.0–8.3)

## 2019-05-14 LAB — BASIC METABOLIC PANEL
BUN: 15 mg/dL (ref 6–23)
CO2: 27 mEq/L (ref 19–32)
Calcium: 10.1 mg/dL (ref 8.4–10.5)
Chloride: 104 mEq/L (ref 96–112)
Creatinine, Ser: 0.93 mg/dL (ref 0.40–1.20)
GFR: 69.63 mL/min (ref >60.00)
Glucose, Bld: 116 mg/dL — ABNORMAL HIGH (ref 70–99)
Potassium: 3.9 mEq/L (ref 3.5–5.1)
Sodium: 138 mEq/L (ref 135–145)

## 2019-05-14 LAB — LIPID PANEL
Cholesterol: 147 mg/dL (ref 0–200)
HDL: 59.5 mg/dL (ref >39.00)
LDL Cholesterol: 62 mg/dL (ref 0–99)
NonHDL: 87.25
Total CHOL/HDL Ratio: 2
Triglycerides: 124 mg/dL (ref 0.0–149.0)
VLDL: 24.8 mg/dL (ref 0.0–40.0)

## 2019-05-14 MED ORDER — IBUPROFEN 600 MG PO TABS: ORAL_TABLET | ORAL | 0 refills | Status: DC

## 2019-05-14 MED ORDER — TRIAMTERENE-HCTZ 37.5-25 MG PO TABS: 1.0000 | ORAL_TABLET | Freq: Every day | ORAL | 3 refills | Status: DC

## 2019-05-14 MED ORDER — AMLODIPINE BESYLATE 5 MG PO TABS: 5.0000 mg | ORAL_TABLET | Freq: Every day | ORAL | 3 refills | Status: DC

## 2019-05-14 MED ORDER — GABAPENTIN 100 MG PO CAPS: ORAL_CAPSULE | ORAL | 3 refills | Status: DC

## 2019-05-14 MED ORDER — ESCITALOPRAM OXALATE 10 MG PO TABS: 10.0000 mg | ORAL_TABLET | Freq: Every day | ORAL | 1 refills | Status: DC

## 2019-05-14 MED ORDER — POTASSIUM CHLORIDE CRYS ER 20 MEQ PO TBCR: 20.0000 meq | EXTENDED_RELEASE_TABLET | Freq: Every day | ORAL | 3 refills | Status: DC

## 2019-05-14 MED ORDER — METOPROLOL SUCCINATE ER 50 MG PO TB24: 50.0000 mg | ORAL_TABLET | Freq: Every day | ORAL | 3 refills | Status: DC

## 2019-05-14 MED ORDER — TEMAZEPAM 30 MG PO CAPS: ORAL_CAPSULE | ORAL | 1 refills | Status: DC

## 2019-05-14 MED ORDER — FAMOTIDINE 40 MG PO TABS: 40.0000 mg | ORAL_TABLET | Freq: Every day | ORAL | 3 refills | Status: DC

## 2019-05-14 NOTE — Assessment & Plan Note (Signed)
Labs

## 2019-05-14 NOTE — Progress Notes (Signed)
[name] , Thank you for taking time to come for your Medicare Wellness Visit. I appreciate your ongoing commitment to your health goals. Please review the following plan we discussed and let me know if I can assist you in the future.   These are the goals we discussed: Goals    . Client understands the importance of follow-up with providers by attending scheduled visits     Wants to exercise more    . Patient Stated     Maintain Current health status by increasing my physical activity and eating healthy. Start a small pot garden.       This is a list of the screening recommended for you and due dates:  Health Maintenance  Topic Date Due  . Tetanus Vaccine  02/10/2020  . Flu Shot  Completed  . DEXA scan (bone density measurement)  Completed  . Pneumonia vaccines  Completed      Established Patient Office Visit  Subjective:  Patient ID: Terri King, female    DOB: 10-Aug-1934  Age: 85 y.o. MRN: [id number]  CC:  Chief Complaint  Patient presents with  . Medicare Wellness    Subsequent     HPI Terri King presents for [employer] Visit  Past Medical History:  Diagnosis Date  . Aortic stenosis    Stage B Moderate AS (Echocardiogram 03/2019: EF 65-70, no RWMA, mild LVH, Gr 1 DD, RVSP 37.6 (mild elevation), mild MR, mod AI, mod AS (mean 28 mmHg/peak 52.3 mmHg/LVOT AV VTI ratio: 0.37)  . CAD (coronary artery disease)    DR Harrington Challenger; by notes normal cath in 1986, normal stress 2013  . Family history of anesthesia complication    NIENCE had sensitivity  . History of pneumonia 2008  . HTN (hypertension)    dr Alain Marion  . Hyperlipemia   . MI (myocardial infarction) ([place])    DR Harrington Challenger  . Osteoarthritis   . Shortness of breath     Past Surgical History:  Procedure Laterality Date  . JOINT REPLACEMENT  12/13   L TKR   . KNEE ARTHROSCOPY     left  . PARTIAL HYSTERECTOMY    . TONSILLECTOMY    . TOTAL KNEE ARTHROPLASTY  01/23/2012   Procedure: TOTAL KNEE  ARTHROPLASTY;  Surgeon: Ninetta Lights, MD;  Location: [facility];  Service: Orthopedics;  Laterality: Left;  . TOTAL KNEE ARTHROPLASTY  12/'05/2011   left knee    Family History  Problem Relation Age of Onset  . Glaucoma Father   . Hypertension Mother   . Hypertension Other     Social History   Socioeconomic History  . Marital status: Widowed    Spouse name: Not on file  . Number of children: Not on file  . Years of education: Not on file  . Highest education level: Not on file  Occupational History  . 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 Smoking status: Never Smoker  . Smokeless tobacco: Never Used  Substance and Sexual Activity  . Alcohol use: No  . Drug use: No  . Sexual activity: Never  Other Topics Concern  . Not on file  Social History Narrative  . Not on file   Social Determinants of Health   Financial Resource Strain:   . Difficulty of Paying Living Expenses:   Food Insecurity:   . Worried About Charity fundraiser in the Last Year:   . [employer]  in the Last Year:   Transportation Needs:   . Freight forwarder (Medical):   Marland Kitchen Lack of Transportation (Non-Medical):   Physical Activity:   . Days of Exercise per Week:   . Minutes of Exercise per Session:   Stress:   . Feeling of Stress :   Social Connections:   . Frequency of Communication with Friends and Family:   . Frequency of Social Gatherings with Friends and Family:   . Attends Religious Services:   . Active Member of Clubs or Organizations:   . Attends Banker Meetings:   Marland Kitchen Marital Status:   Intimate Partner Violence:   . Fear of Current or Ex-Partner:   . Emotionally Abused:   Marland Kitchen Physically Abused:   . Sexually Abused:     Outpatient Medications Prior to Visit  Medication Sig Dispense Refill  . amLODipine (NORVASC) 5 MG tablet TAKE 1 TABLET EVERY DAY 90 tablet 3  . Ascorbic Acid (VITAMIN C PO) Take 1 tablet by mouth daily.      . cholecalciferol (VITAMIN D) 400 UNITS TABS Take 400 Units by mouth daily.    Marland Kitchen escitalopram (LEXAPRO) 10 MG tablet Take 1 tablet (10 mg total) by mouth daily. 90 tablet 1  . Evolocumab (REPATHA SURECLICK) 140 MG/ML SOAJ Inject 1 pen into the skin every 14 (fourteen) days. 6 pen 3  . famotidine (PEPCID) 40 MG tablet Take 1 tablet (40 mg total) by mouth daily. Annual appt due in June must see provider for future refills 90 tablet 0  . feeding supplement, ENSURE COMPLETE, (ENSURE COMPLETE) LIQD Take 237 mLs by mouth daily. 7110 mL 11  . gabapentin (NEURONTIN) 100 MG capsule TAKE 1 CAPSULE THREE TIMES DAILY 270 capsule 1  . ibuprofen (IBU) 600 MG tablet TAKE 1 TABLET EVERY DAY AS NEEDED FOR MODERATE PAIN 90 tablet 0  . Krill Oil (MAXIMUM RED KRILL) 300 MG CAPS 1 po qd 100 capsule 3  . metoprolol succinate (TOPROL-XL) 50 MG 24 hr tablet TAKE 1 TABLET AT BEDTIME 90 tablet 3  . Multiple Vitamins-Minerals (MULTIPLE VITAMINS/WOMENS PO) Take by mouth daily.     . nitroGLYCERIN (NITROSTAT) 0.4 MG SL tablet Place 1 tablet (0.4 mg total) under the tongue every 5 (five) minutes as needed for chest pain. 25 tablet 6  . potassium chloride SA (K-DUR,KLOR-CON) 20 MEQ tablet TAKE 1 TABLET EVERY DAY 90 tablet 2  . temazepam (RESTORIL) 30 MG capsule TAKE 1 CAPSULE AT BEDTIME AS NEEDED  FOR  SLEEP 90 capsule 1  . triamterene-hydrochlorothiazide (MAXZIDE-25) 37.5-25 MG tablet TAKE 1 TABLET EVERY DAY 90 tablet 3  . valACYclovir (VALTREX) 1000 MG tablet Take 1 tablet (1,000 mg total) by mouth 3 (three) times daily. 21 tablet 0  . vitamin B-12 (CYANOCOBALAMIN) 100 MCG tablet Take 100 mcg by mouth daily.    . Vitamin E 400 units TABS Take by mouth daily.      No facility-administered medications prior to visit.    Allergies  Allergen Reactions  . Codeine Other (See Comments)    Abnormal behavior  . Crestor [Rosuvastatin]     Myalgias   . Dexilant [Dexlansoprazole]     Made me sick  . Ibuprofen Nausea And  Vomiting  . Simvastatin     REACTION: achy  . Tylenol [Acetaminophen]   . Iodine Swelling and Rash    ROS Review of Systems    Objective:    Physical Exam  BP 118/78 (BP Location: Left Arm, Patient Position:  Sitting, Cuff Size: Normal)   Pulse 60   Temp 99 F (37.2 C)   Resp 16   Ht 5\' 4"  (1.626 m)   Wt 169 lb 6.4 oz (76.8 kg)   SpO2 98%   BMI 29.08 kg/m  Wt Readings from Last 3 Encounters:  05/14/19 169 lb (76.7 kg)  05/14/19 169 lb 6.4 oz (76.8 kg)  03/20/19 174 lb 6.4 oz (79.1 kg)    There are no preventive care reminders to display for this patient.  There are no preventive care reminders to display for this patient.  Lab Results  Component Value Date   TSH 1.84 08/07/2018   Lab Results  Component Value Date   WBC 8.4 08/07/2018   HGB 13.5 08/07/2018   HCT 39.3 08/07/2018   MCV 90.8 08/07/2018   PLT 278.0 08/07/2018   Lab Results  Component Value Date   NA 137 08/07/2018   K 3.5 08/07/2018   CO2 26 08/07/2018   GLUCOSE 129 (H) 08/07/2018   BUN 12 08/07/2018   CREATININE 0.89 08/07/2018   BILITOT 0.8 08/07/2018   ALKPHOS 87 08/07/2018   AST 18 08/07/2018   ALT 10 08/07/2018   PROT 7.3 08/07/2018   ALBUMIN 4.2 08/07/2018   CALCIUM 9.9 08/07/2018   ANIONGAP 12 04/29/2015   GFR 73.39 08/07/2018   Lab Results  Component Value Date   CHOL 254 (H) 08/07/2018   Lab Results  Component Value Date   HDL 55.50 08/07/2018   Lab Results  Component Value Date   LDLCALC 160 (H) 08/07/2018   Lab Results  Component Value Date   TRIG 190.0 (H) 08/07/2018   Lab Results  Component Value Date   CHOLHDL 5 08/07/2018   Lab Results  Component Value Date   HGBA1C 6.1 05/10/2016      Assessment & Plan:   Problem List Items Addressed This Visit    None    Visit Diagnoses    Encounter for Medicare annual wellness exam    -  Primary      No orders of the defined types were placed in this encounter.   Follow-up: Return in 1 year (on  05/13/2020) for Annual Wellness Visit.    05/15/2020, LPN

## 2019-05-14 NOTE — Patient Instructions (Addendum)
Terri King , Thank you for taking time to come for your Medicare Wellness Visit. I appreciate your ongoing commitment to your health goals. Please review the following plan we discussed and let me know if I can assist you in the future.   Screening recommendations/referrals: Colorectal Screening: no longer required Mammogram: 09/24/2018 Bone Density: no longer required  Vision and Dental Exams: Recommended annual ophthalmology exams for early detection of glaucoma and other disorders of the eye Recommended annual dental exams for proper oral hygiene  Vaccinations: Influenza vaccine: 11/10/2018 Pneumococcal vaccine: 11/01/2015, 11/10/2018 Tdap vaccine: 02/09/2010 Shingles vaccine: declined; Please call your insurance company to determine your out of pocket expense for the Shingrix vaccine. You may receive this vaccine at your local pharmacy.  Advanced directives: Advance directives discussed with you today.  Please bring a copy of your POA (Power of [place]) and/or Living Will to your next appointment.  Goals: Recommend to drink at least 6-8 8oz glasses of water per day.  Recommend to exercise for at least 150 minutes per week.  Recommend to remove any items from the home that may cause slips or trips.  Recommend to decrease portion sizes by eating 3 small healthy meals and at least 2 healthy snacks per day.  Recommend to begin DASH diet as directed below  Next appointment: Please schedule your Annual Wellness Visit with your Nurse Health Advisor in one year.  Preventive Care 16 Years and Older, Female Preventive care refers to lifestyle choices and visits with your health care provider that can promote health and wellness. What does preventive care include?  A yearly physical exam. This is also called an annual well check.  Dental exams once or twice a year.  Routine eye exams. Ask your health care provider how often you should have your eyes checked.  Personal lifestyle  choices, including:  Daily care of your teeth and gums.  Regular physical activity.  Eating a healthy diet.  Avoiding tobacco and drug use.  Limiting alcohol use.  Practicing safe sex.  Taking low-dose aspirin every day if recommended by your health care provider.  Taking vitamin and mineral supplements as recommended by your health care provider. What happens during an annual well check? The services and screenings done by your health care provider during your annual well check will depend on your age, overall health, lifestyle risk factors, and family history of disease. Counseling  Your health care provider may ask you questions about your:  Alcohol use.  Tobacco use.  Drug use.  Emotional well-being.  Home and relationship well-being.  Sexual activity.  Eating habits.  History of falls.  Memory and ability to understand (cognition).  Work and work Statistician.  Reproductive health. Screening  You may have the following tests or measurements:  Height, weight, and BMI.  Blood pressure.  Lipid and cholesterol levels. These may be checked every 5 years, or more frequently if you are over 57 years old.  Skin check.  Lung cancer screening. You may have this screening every year starting at age 13 if you have a 30-pack-year history of smoking and currently smoke or have quit within the past 15 years.  Fecal occult blood test (FOBT) of the stool. You may have this test every year starting at age 58.  Flexible sigmoidoscopy or colonoscopy. You may have a sigmoidoscopy every 5 years or a colonoscopy every 10 years starting at age 17.  Hepatitis C blood test.  Hepatitis B blood test.  Sexually transmitted disease (STD) testing.  Diabetes screening. This is done by checking your blood sugar (glucose) after you have not eaten for a while (fasting). You may have this done every 1-3 years.  Bone density scan. This is done to screen for osteoporosis. You may  have this done starting at age 58.  Mammogram. This may be done every 1-2 years. Talk to your health care provider about how often you should have regular mammograms. Talk with your health care provider about your test results, treatment options, and if necessary, the need for more tests. Vaccines  Your health care provider may recommend certain vaccines, such as:  Influenza vaccine. This is recommended every year.  Tetanus, diphtheria, and acellular pertussis (Tdap, Td) vaccine. You may need a Td booster every 10 years.  Zoster vaccine. You may need this after age 60.  Pneumococcal 13-valent conjugate (PCV13) vaccine. One dose is recommended after age 9.  Pneumococcal polysaccharide (PPSV23) vaccine. One dose is recommended after age 56. Talk to your health care provider about which screenings and vaccines you need and how often you need them. This information is not intended to replace advice given to you by your health care provider. Make sure you discuss any questions you have with your health care provider. Document Released: 03/04/2015 Document Revised: 10/26/2015 Document Reviewed: 12/07/2014 Elsevier Interactive Patient Education  2017 [place] Prevention in the Home Falls can cause injuries. They can happen to people of all ages. There are many things you can do to make your home safe and to help prevent falls. What can I do on the outside of my home?  Regularly fix the edges of walkways and driveways and fix any cracks.  Remove anything that might make you trip as you walk through a door, such as a raised step or threshold.  Trim any bushes or trees on the path to your home.  Use bright outdoor lighting.  Clear any walking paths of anything that might make someone trip, such as rocks or tools.  Regularly check to see if handrails are loose or broken. Make sure that both sides of any steps have handrails.  Any raised decks and porches should have guardrails  on the edges.  Have any leaves, snow, or ice cleared regularly.  Use sand or salt on walking paths during winter.  Clean up any spills in your garage right away. This includes oil or grease spills. What can I do in the bathroom?  Use night lights.  Install grab bars by the toilet and in the tub and shower. Do not use towel bars as grab bars.  Use non-skid mats or decals in the tub or shower.  If you need to sit down in the shower, use a plastic, non-slip stool.  Keep the floor dry. Clean up any water that spills on the floor as soon as it happens.  Remove soap buildup in the tub or shower regularly.  Attach bath mats securely with double-sided non-slip rug tape.  Do not have throw rugs and other things on the floor that can make you trip. What can I do in the bedroom?  Use night lights.  Make sure that you have a light by your bed that is easy to reach.  Do not use any sheets or blankets that are too big for your bed. They should not hang down onto the floor.  Have a firm chair that has side arms. You can use this for support while you get dressed.  Do not have throw  rugs and other things on the floor that can make you trip. What can I do in the kitchen?  Clean up any spills right away.  Avoid walking on wet floors.  Keep items that you use a lot in easy-to-reach places.  If you need to reach something above you, use a strong step stool that has a grab bar.  Keep electrical cords out of the way.  Do not use floor polish or wax that makes floors slippery. If you must use wax, use non-skid floor wax.  Do not have throw rugs and other things on the floor that can make you trip. What can I do with my stairs?  Do not leave any items on the stairs.  Make sure that there are handrails on both sides of the stairs and use them. Fix handrails that are broken or loose. Make sure that handrails are as long as the stairways.  Check any carpeting to make sure that it is  firmly attached to the stairs. Fix any carpet that is loose or worn.  Avoid having throw rugs at the top or bottom of the stairs. If you do have throw rugs, attach them to the floor with carpet tape.  Make sure that you have a light switch at the top of the stairs and the bottom of the stairs. If you do not have them, ask someone to add them for you. What else can I do to help prevent falls?  Wear shoes that:  Do not have high heels.  Have rubber bottoms.  Are comfortable and fit you well.  Are closed at the toe. Do not wear sandals.  If you use a stepladder:  Make sure that it is fully opened. Do not climb a closed stepladder.  Make sure that both sides of the stepladder are locked into place.  Ask someone to hold it for you, if possible.  Clearly mark and make sure that you can see:  Any grab bars or handrails.  First and last steps.  Where the edge of each step is.  Use tools that help you move around (mobility aids) if they are needed. These include:  Canes.  Walkers.  Scooters.  Crutches.  Turn on the lights when you go into a dark area. Replace any light bulbs as soon as they burn out.  Set up your furniture so you have a clear path. Avoid moving your furniture around.  If any of your floors are uneven, fix them.  If there are any pets around you, be aware of where they are.  Review your medicines with your doctor. Some medicines can make you feel dizzy. This can increase your chance of falling. Ask your doctor what other things that you can do to help prevent falls. This information is not intended to replace advice given to you by your health care provider. Make sure you discuss any questions you have with your health care provider. Document Released: 12/02/2008 Document Revised: 07/14/2015 Document Reviewed: 03/12/2014 Elsevier Interactive Patient Education  2017 [employer].

## 2019-05-14 NOTE — Progress Notes (Signed)
Subjective:  Patient ID: Terri King, female    DOB: [birth year]  Age: 85 y.o. MRN: [id number]  CC: No chief complaint on file.   HPI Terri King presents for OA, insomnia, CAD follow-up  Outpatient Medications Prior to Visit  Medication Sig Dispense Refill  . amLODipine (NORVASC) 5 MG tablet TAKE 1 TABLET EVERY DAY 90 tablet 3  . Ascorbic Acid (VITAMIN C PO) Take 1 tablet by mouth daily.    . cholecalciferol (VITAMIN D) 400 UNITS TABS Take 400 Units by mouth daily.    Marland Kitchen escitalopram (LEXAPRO) 10 MG tablet Take 1 tablet (10 mg total) by mouth daily. 90 tablet 1  . Evolocumab (REPATHA SURECLICK) 140 MG/ML SOAJ Inject 1 pen into the skin every 14 (fourteen) days. 6 pen 3  . famotidine (PEPCID) 40 MG tablet Take 1 tablet (40 mg total) by mouth daily. Annual appt due in June must see provider for future refills 90 tablet 0  . feeding supplement, ENSURE COMPLETE, (ENSURE COMPLETE) LIQD Take 237 mLs by mouth daily. 7110 mL 11  . gabapentin (NEURONTIN) 100 MG capsule TAKE 1 CAPSULE THREE TIMES DAILY 270 capsule 1  . ibuprofen (IBU) 600 MG tablet TAKE 1 TABLET EVERY DAY AS NEEDED FOR MODERATE PAIN 90 tablet 0  . Krill Oil (MAXIMUM RED KRILL) 300 MG CAPS 1 po qd 100 capsule 3  . metoprolol succinate (TOPROL-XL) 50 MG 24 hr tablet TAKE 1 TABLET AT BEDTIME 90 tablet 3  . Multiple Vitamins-Minerals (MULTIPLE VITAMINS/WOMENS PO) Take by mouth daily.     . nitroGLYCERIN (NITROSTAT) 0.4 MG SL tablet Place 1 tablet (0.4 mg total) under the tongue every 5 (five) minutes as needed for chest pain. 25 tablet 6  . potassium chloride SA (K-DUR,KLOR-CON) 20 MEQ tablet TAKE 1 TABLET EVERY DAY 90 tablet 2  . temazepam (RESTORIL) 30 MG capsule TAKE 1 CAPSULE AT BEDTIME AS NEEDED  FOR  SLEEP 90 capsule 1  . triamterene-hydrochlorothiazide (MAXZIDE-25) 37.5-25 MG tablet TAKE 1 TABLET EVERY DAY 90 tablet 3  . valACYclovir (VALTREX) 1000 MG tablet Take 1 tablet (1,000 mg total) by mouth 3 (three) times daily.  21 tablet 0  . vitamin B-12 (CYANOCOBALAMIN) 100 MCG tablet Take 100 mcg by mouth daily.    . Vitamin E 400 units TABS Take by mouth daily.      No facility-administered medications prior to visit.    ROS: Review of Systems  Constitutional: Negative for activity change, appetite change, chills, fatigue and unexpected weight change.  HENT: Negative for congestion, mouth sores and sinus pressure.   Eyes: Negative for visual disturbance.  Respiratory: Negative for cough and chest tightness.   Gastrointestinal: Negative for abdominal pain and nausea.  Genitourinary: Negative for difficulty urinating, frequency and vaginal pain.  Musculoskeletal: Positive for arthralgias, back pain and gait problem.  Skin: Negative for pallor and rash.  Neurological: Negative for dizziness, tremors, weakness, numbness and headaches.  Psychiatric/Behavioral: Positive for sleep disturbance. Negative for confusion and suicidal ideas.    Objective:  BP 118/78 (BP Location: Left Arm, Patient Position: Sitting, Cuff Size: Normal)   Pulse 60   Temp 99 F (37.2 C) (Oral)   Ht 5\' 4"  (1.626 m)   Wt 169 lb (76.7 kg)   SpO2 98%   BMI 29.01 kg/m   BP Readings from Last 3 Encounters:  05/14/19 118/78  05/14/19 118/78  03/20/19 128/76    Wt Readings from Last 3 Encounters:  05/14/19 169 lb (76.7 kg)  05/14/19 169 lb 6.4 oz (76.8 kg)  03/20/19 174 lb 6.4 oz (79.1 kg)    Physical Exam Constitutional:      General: She is not in acute distress.    Appearance: She is well-developed.  HENT:     Head: Normocephalic.     Right Ear: External ear normal.     Left Ear: External ear normal.     Nose: Nose normal.  Eyes:     General:        Right eye: No discharge.        Left eye: No discharge.     Conjunctiva/sclera: Conjunctivae normal.     Pupils: Pupils are equal, round, and reactive to light.  Neck:     Thyroid: No thyromegaly.     Vascular: No JVD.     Trachea: No tracheal deviation.    Cardiovascular:     Rate and Rhythm: Normal rate and regular rhythm.     Heart sounds: Normal heart sounds.  Pulmonary:     Effort: No respiratory distress.     Breath sounds: No stridor. No wheezing.  Abdominal:     General: Bowel sounds are normal. There is no distension.     Palpations: Abdomen is soft. There is no mass.     Tenderness: There is no abdominal tenderness. There is no guarding or rebound.  Musculoskeletal:        General: No tenderness.     Cervical back: Normal range of motion and neck supple.  Lymphadenopathy:     Cervical: No cervical adenopathy.  Skin:    Findings: No erythema or rash.  Neurological:     Mental Status: She is oriented to person, place, and time.     Cranial Nerves: No cranial nerve deficit.     Motor: No abnormal muscle tone.     Coordination: Coordination abnormal.     Deep Tendon Reflexes: Reflexes normal.  Psychiatric:        Behavior: Behavior normal.        Thought Content: Thought content normal.        Judgment: Judgment normal.    Cane  Lab Results  Component Value Date   WBC 8.4 08/07/2018   HGB 13.5 08/07/2018   HCT 39.3 08/07/2018   PLT 278.0 08/07/2018   GLUCOSE 129 (H) 08/07/2018   CHOL 254 (H) 08/07/2018   TRIG 190.0 (H) 08/07/2018   HDL 55.50 08/07/2018   LDLDIRECT 186.1 03/24/2013   LDLCALC 160 (H) 08/07/2018   ALT 10 08/07/2018   AST 18 08/07/2018   NA 137 08/07/2018   K 3.5 08/07/2018   CL 101 08/07/2018   CREATININE 0.89 08/07/2018   BUN 12 08/07/2018   CO2 26 08/07/2018   TSH 1.84 08/07/2018   INR 1.44 01/25/2012   HGBA1C 6.1 05/10/2016    DG Bone Density  Result Date: 10/05/2015 Date of study: 10/03/2015 Exam: DUAL X-RAY ABSORPTIOMETRY (DXA) FOR BONE MINERAL DENSITY (BMD) Instrument: [employer] [facility] Provider: PCP Indication: screening for osteoporosis Comparison: none (please note that it is not possible to compare data from different instruments) Clinical data: Pt is a 85 y.o. female  with history of rib fracture. On vitamin D supplements. Results:  Lumbar spine (L2, L4) Femoral neck (FN) Change in BMD from previous DXA test (%) -2.9  RFN:-2.6 LFN:-1.8  (*) statistically significant Assessment: Patient has OSTEOPOROSIS according to the [facility] classification for osteoporosis (see below). Fracture risk: high Comments: the technical quality of the study  is good, however, the spine is scoliotic and arthritic. Calcium accumulation in arthritic sites can confound the results of the bone density scan. L1 and L3 vertebrae had to be excluded from analysis. Evaluation for secondary causes should be considered if clinically indicated. Recommend optimizing calcium (1200 mg/day) and vitamin D (800 IU/day). Followup: Repeat BMD is appropriate after 1-2 years WHO criteria for diagnosis of osteoporosis in postmenopausal women and in men 72 y/o or older: - normal: T-score -1.0 to + 1.0 - osteopenia/low bone density: T-score between -2.5 and -1.0 - osteoporosis: T-score below -2.5 - severe osteoporosis: T-score below -2.5 with history of fragility fracture Note: although not part of the WHO classification, the presence of a fragility fracture, regardless of the T-score, should be considered diagnostic of osteoporosis, provided other causes for the fracture have been excluded. Treatment: The National Osteoporosis Foundation recommends that treatment be considered in postmenopausal women and men age 48 or older with: 1. Hip or vertebral (clinical or morphometric) fracture 2. T-score of - 2.5 or lower at the spine or hip 3. 10-year fracture probability by FRAX of at least 20% for a major osteoporotic fracture and 3% for a hip fracture Carlus Pavlov, MD [facility]    Assessment & Plan:   Follow-up: No follow-ups on file.  Sonda Primes, MD

## 2019-05-14 NOTE — Assessment & Plan Note (Signed)
Temazepam prn   Potential benefits of a long term benzodiazepines  use as well as potential risks  and complications were explained to the patient and were aknowledged. 

## 2019-05-14 NOTE — Assessment & Plan Note (Signed)
Norvasc, ASA 81

## 2019-05-14 NOTE — Assessment & Plan Note (Signed)
Terri King

## 2019-05-15 ENCOUNTER — Telehealth: Payer: Self-pay | Admitting: Pharmacist

## 2019-05-15 NOTE — Telephone Encounter (Addendum)
Called patient to review recent lipid labs at PCP office. LDL is at goal at 62. Patient should continue Repatha 140mg  q 14 days. Can recheck lipids at next cardiology appointment. Could not leave message because mailbox full

## 2019-06-18 ENCOUNTER — Telehealth: Payer: Self-pay | Admitting: Internal Medicine

## 2019-06-18 NOTE — Telephone Encounter (Signed)
New message:   Pt is calling and states Dr. Macario Golds sent a request for a light weight walker for the pt and she states she went to pick it up at the location and the place was closed and has moved to another location. She states there was no new address to the location. Please advise.

## 2019-06-19 NOTE — Telephone Encounter (Signed)
I am okay with the order.  Where she was sent to?

## 2019-06-19 NOTE — Telephone Encounter (Signed)
I do not see one ordered in chart. Please advise

## 2019-06-22 NOTE — Telephone Encounter (Signed)
Pt has the RX for it, she did call to find the other location and will the RX there to get walker

## 2019-08-04 ENCOUNTER — Telehealth: Payer: Self-pay

## 2019-08-05 MED ORDER — BD SWAB SINGLE USE REGULAR PADS: 1.0000 | MEDICATED_PAD | 3 refills | Status: DC

## 2019-08-05 MED ORDER — TRUE METRIX LEVEL 1 LOW VI SOLN: 1.0000 | 1 refills | Status: DC

## 2019-08-05 NOTE — Telephone Encounter (Signed)
See meds 

## 2019-08-19 ENCOUNTER — Ambulatory Visit: Payer: Medicare HMO | Admitting: Internal Medicine

## 2019-08-31 ENCOUNTER — Other Ambulatory Visit: Payer: Self-pay | Admitting: Internal Medicine

## 2019-10-09 ENCOUNTER — Other Ambulatory Visit: Payer: Self-pay | Admitting: Internal Medicine

## 2019-10-14 ENCOUNTER — Telehealth: Payer: Self-pay

## 2019-10-14 NOTE — Telephone Encounter (Signed)
New message   Pt c/o medication issue:  1. Name of Medication: potassium chloride SA (KLOR-CON) 20 MEQ tablet  2. How are you currently taking this medication (dosage and times per day)? One time per day   3. Are you having a reaction (difficulty breathing--STAT)? No   4. What is your medication issue? Getting choking when taken medication / want to resume the original medication if possible

## 2019-10-16 MED ORDER — POTASSIUM CHLORIDE CRYS ER 20 MEQ PO TBCR: 20.0000 meq | EXTENDED_RELEASE_TABLET | Freq: Every day | ORAL | 3 refills | Status: DC

## 2019-10-16 NOTE — Telephone Encounter (Signed)
Okay.  Done.  Thanks 

## 2019-10-19 NOTE — Telephone Encounter (Signed)
Pt was previously taking Potassium Chloride 8 MEQ and wanting to go back to this medication instead of the Potassium Chloride 20 MEQ

## 2019-10-19 NOTE — Telephone Encounter (Signed)
Left pt a vm to call back to get clarification on if she's wanting to switch to something similar

## 2019-10-20 MED ORDER — POTASSIUM CHLORIDE ER 8 MEQ PO TBCR
8.0000 meq | EXTENDED_RELEASE_TABLET | Freq: Two times a day (BID) | ORAL | 3 refills | Status: DC
Start: 2019-10-20 — End: 2020-08-20

## 2019-10-20 NOTE — Telephone Encounter (Signed)
See 10/20/19 med refill 

## 2019-10-20 NOTE — Telephone Encounter (Signed)
Ok Thx 

## 2019-10-30 ENCOUNTER — Encounter: Payer: Self-pay | Admitting: Internal Medicine

## 2019-10-30 ENCOUNTER — Other Ambulatory Visit: Payer: Self-pay

## 2019-10-30 ENCOUNTER — Ambulatory Visit (INDEPENDENT_AMBULATORY_CARE_PROVIDER_SITE_OTHER): Payer: Medicare HMO | Admitting: Internal Medicine

## 2019-10-30 VITALS — BP 144/58 | HR 67 | Temp 98.4°F | Ht 64.0 in | Wt 162.0 lb

## 2019-10-30 DIAGNOSIS — F5101 Primary insomnia: Secondary | ICD-10-CM

## 2019-10-30 DIAGNOSIS — I10 Essential (primary) hypertension: Secondary | ICD-10-CM | POA: Diagnosis not present

## 2019-10-30 DIAGNOSIS — I2510 Atherosclerotic heart disease of native coronary artery without angina pectoris: Secondary | ICD-10-CM

## 2019-10-30 DIAGNOSIS — M8949 Other hypertrophic osteoarthropathy, multiple sites: Secondary | ICD-10-CM

## 2019-10-30 DIAGNOSIS — R634 Abnormal weight loss: Secondary | ICD-10-CM | POA: Diagnosis not present

## 2019-10-30 DIAGNOSIS — M159 Polyosteoarthritis, unspecified: Secondary | ICD-10-CM

## 2019-10-30 DIAGNOSIS — Z23 Encounter for immunization: Secondary | ICD-10-CM

## 2019-10-30 LAB — TSH: TSH: 0.97 mIU/L (ref 0.40–4.50)

## 2019-10-30 MED ORDER — TEMAZEPAM 30 MG PO CAPS: ORAL_CAPSULE | ORAL | 1 refills | Status: DC

## 2019-10-30 NOTE — Assessment & Plan Note (Signed)
On Pravastatin and ASA, Norvasc

## 2019-10-30 NOTE — Patient Instructions (Signed)
[place] started vaccine booster sign up. Please call [place] Vaccine Line at [phone]. You can also call the venue where you had your initial COVID 19 vaccination.   

## 2019-10-30 NOTE — Addendum Note (Signed)
Addended by: Marshell Garfinkel on: 10/30/2019 01:48 PM   Modules accepted: Orders

## 2019-10-30 NOTE — Assessment & Plan Note (Signed)
Check BP at home Call if up Norvasc

## 2019-10-30 NOTE — Addendum Note (Signed)
Addended by: Merrilyn Puma on: 10/30/2019 11:53 AM   Modules accepted: Orders

## 2019-10-30 NOTE — Assessment & Plan Note (Signed)
Temazepam at hs prn - not w/Xanax  Potential benefits of a long term benzodiazepines  use as well as potential risks  and complications were explained to the patient and were aknowledged.

## 2019-10-30 NOTE — Assessment & Plan Note (Signed)
Ibuprofen prn Gabapentin prn

## 2019-10-30 NOTE — Progress Notes (Signed)
Subjective:  Patient ID: Terri King, female    DOB: Jun 29, 1934  Age: 85 y.o. MRN: [id number]  CC: No chief complaint on file.   HPI Terri King presents for HTN, OA, insomnia  Outpatient Medications Prior to Visit  Medication Sig Dispense Refill  . Alcohol Swabs (B-D SINGLE USE SWABS REGULAR) PADS 1 each by Does not apply route as directed. Dx: R73.9 100 each 3  . amLODipine (NORVASC) 5 MG tablet Take 1 tablet (5 mg total) by mouth daily. 90 tablet 3  . Ascorbic Acid (VITAMIN C PO) Take 1 tablet by mouth daily.    . Blood Glucose Calibration (TRUE METRIX LEVEL 1) Low SOLN 1 each by In Vitro route as directed. Dx: R73.9 1 each 1  . cholecalciferol (VITAMIN D) 400 UNITS TABS Take 400 Units by mouth daily.    Marland Kitchen escitalopram (LEXAPRO) 10 MG tablet TAKE 1 TABLET EVERY DAY 90 tablet 1  . Evolocumab (REPATHA SURECLICK) 140 MG/ML SOAJ Inject 1 pen into the skin every 14 (fourteen) days. 6 pen 3  . feeding supplement, ENSURE COMPLETE, (ENSURE COMPLETE) LIQD Take 237 mLs by mouth daily. 7110 mL 11  . gabapentin (NEURONTIN) 100 MG capsule TAKE 1 CAPSULE THREE TIMES DAILY 270 capsule 3  . ibuprofen (IBU) 600 MG tablet TAKE 1 TABLET EVERY DAY AS NEEDED FOR  MODERATE  PAIN 90 tablet 0  . Krill Oil (MAXIMUM RED KRILL) 300 MG CAPS 1 po qd 100 capsule 3  . metoprolol succinate (TOPROL-XL) 50 MG 24 hr tablet Take 1 tablet (50 mg total) by mouth at bedtime. Take with or immediately following a meal. 90 tablet 3  . Multiple Vitamins-Minerals (MULTIPLE VITAMINS/WOMENS PO) Take by mouth daily.     . nitroGLYCERIN (NITROSTAT) 0.4 MG SL tablet Place 1 tablet (0.4 mg total) under the tongue every 5 (five) minutes as needed for chest pain. 25 tablet 6  . potassium chloride (KLOR-CON) 8 MEQ tablet Take 1 tablet (8 mEq total) by mouth 2 (two) times daily. 180 tablet 3  . temazepam (RESTORIL) 30 MG capsule TAKE 1 CAPSULE AT BEDTIME AS NEEDED  FOR  SLEEP 90 capsule 1  . triamterene-hydrochlorothiazide  (MAXZIDE-25) 37.5-25 MG tablet Take 1 tablet by mouth daily. 90 tablet 3  . valACYclovir (VALTREX) 1000 MG tablet Take 1 tablet (1,000 mg total) by mouth 3 (three) times daily. 21 tablet 0  . vitamin B-12 (CYANOCOBALAMIN) 100 MCG tablet Take 100 mcg by mouth daily.    . Vitamin E 400 units TABS Take by mouth daily.     . famotidine (PEPCID) 40 MG tablet Take 1 tablet (40 mg total) by mouth daily. Annual appt due in June must see provider for future refills (Patient not taking: Reported on 10/30/2019) 90 tablet 3   No facility-administered medications prior to visit.    ROS: Review of Systems  Constitutional: Negative for activity change, appetite change, chills, fatigue and unexpected weight change.  HENT: Negative for congestion, mouth sores and sinus pressure.   Eyes: Negative for visual disturbance.  Respiratory: Negative for cough and chest tightness.   Gastrointestinal: Negative for abdominal pain and nausea.  Genitourinary: Negative for difficulty urinating, frequency and vaginal pain.  Musculoskeletal: Positive for arthralgias and gait problem. Negative for back pain.  Skin: Negative for pallor and rash.  Neurological: Negative for dizziness, tremors, weakness, numbness and headaches.  Psychiatric/Behavioral: Negative for confusion and sleep disturbance.    Objective:  BP (!) 144/58 (BP Location: Left Arm, Patient  Position: Sitting, Cuff Size: Large)   Pulse 67   Temp 98.4 F (36.9 C) (Oral)   Ht 5\' 4"  (1.626 m)   Wt 162 lb (73.5 kg)   SpO2 98%   BMI 27.81 kg/m   BP Readings from Last 3 Encounters:  10/30/19 (!) 144/58  05/14/19 118/78  05/14/19 118/78    Wt Readings from Last 3 Encounters:  10/30/19 162 lb (73.5 kg)  05/14/19 169 lb (76.7 kg)  05/14/19 169 lb 6.4 oz (76.8 kg)    Physical Exam Constitutional:      General: She is not in acute distress.    Appearance: She is well-developed. She is obese.  HENT:     Head: Normocephalic.     Right Ear: External  ear normal.     Left Ear: External ear normal.     Nose: Nose normal.  Eyes:     General:        Right eye: No discharge.        Left eye: No discharge.     Conjunctiva/sclera: Conjunctivae normal.     Pupils: Pupils are equal, round, and reactive to light.  Neck:     Thyroid: No thyromegaly.     Vascular: No JVD.     Trachea: No tracheal deviation.  Cardiovascular:     Rate and Rhythm: Normal rate and regular rhythm.     Heart sounds: Murmur heard.   Pulmonary:     Effort: No respiratory distress.     Breath sounds: No stridor. No wheezing.  Abdominal:     General: Bowel sounds are normal. There is no distension.     Palpations: Abdomen is soft. There is no mass.     Tenderness: There is no abdominal tenderness. There is no guarding or rebound.  Musculoskeletal:        General: No tenderness.     Cervical back: Normal range of motion and neck supple.  Lymphadenopathy:     Cervical: No cervical adenopathy.  Skin:    Findings: No erythema or rash.  Neurological:     Mental Status: She is oriented to person, place, and time.     Cranial Nerves: No cranial nerve deficit.     Motor: No abnormal muscle tone.     Coordination: Coordination abnormal.     Gait: Gait abnormal.     Deep Tendon Reflexes: Reflexes normal.  Psychiatric:        Behavior: Behavior normal.        Thought Content: Thought content normal.        Judgment: Judgment normal.   cane  Lab Results  Component Value Date   WBC 6.3 05/14/2019   HGB 14.0 05/14/2019   HCT 40.9 05/14/2019   PLT 254.0 05/14/2019   GLUCOSE 116 (H) 05/14/2019   CHOL 147 05/14/2019   TRIG 124.0 05/14/2019   HDL 59.50 05/14/2019   LDLDIRECT 186.1 03/24/2013   LDLCALC 62 05/14/2019   ALT 15 05/14/2019   AST 21 05/14/2019   NA 138 05/14/2019   K 3.9 05/14/2019   CL 104 05/14/2019   CREATININE 0.93 05/14/2019   BUN 15 05/14/2019   CO2 27 05/14/2019   TSH 1.24 05/14/2019   INR 1.44 01/25/2012   HGBA1C 6.1 05/10/2016     DG Bone Density  Result Date: 10/05/2015 Date of study: 10/03/2015 Exam: DUAL X-RAY ABSORPTIOMETRY (DXA) FOR BONE MINERAL DENSITY (BMD) Instrument: 10/05/2015 [facility] [employer] Requesting Provider: PCP Indication: screening for osteoporosis Comparison: none (please  note that it is not possible to compare data from different instruments) Clinical data: Pt is a 85 y.o. female with history of rib fracture. On vitamin D supplements. Results:  Lumbar spine (L2, L4) Femoral neck (FN) Change in BMD from previous DXA test (%) -2.9  RFN:-2.6 LFN:-1.8  (*) statistically significant Assessment: Patient has OSTEOPOROSIS according to the [facility] classification for osteoporosis (see below). Fracture risk: high Comments: the technical quality of the study is good, however, the spine is scoliotic and arthritic. Calcium accumulation in arthritic sites can confound the results of the bone density scan. L1 and L3 vertebrae had to be excluded from analysis. Evaluation for secondary causes should be considered if clinically indicated. Recommend optimizing calcium (1200 mg/day) and vitamin D (800 IU/day). Followup: Repeat BMD is appropriate after 1-2 years WHO criteria for diagnosis of osteoporosis in postmenopausal women and in men 60 y/o or older: - normal: T-score -1.0 to + 1.0 - osteopenia/low bone density: T-score between -2.5 and -1.0 - osteoporosis: T-score below -2.5 - severe osteoporosis: T-score below -2.5 with history of fragility fracture Note: although not part of the WHO classification, the presence of a fragility fracture, regardless of the T-score, should be considered diagnostic of osteoporosis, provided other causes for the fracture have been excluded. Treatment: The National Osteoporosis Foundation recommends that treatment be considered in postmenopausal women and men age 53 or older with: 1. Hip or vertebral (clinical or morphometric) fracture 2. T-score of - 2.5 or lower at the spine or hip 3. 10-year fracture  probability by FRAX of at least 20% for a major osteoporotic fracture and 3% for a hip fracture Carlus Pavlov, MD [facility]    Assessment & Plan:    Sonda Primes, MD

## 2019-10-31 LAB — COMPLETE METABOLIC PANEL WITH GFR
AG Ratio: 1.5 (calc) (ref 1.0–2.5)
ALT: 13 U/L (ref 6–29)
AST: 26 U/L (ref 10–35)
Albumin: 4.3 g/dL (ref 3.6–5.1)
Alkaline phosphatase (APISO): 86 U/L (ref 37–153)
BUN: 15 mg/dL (ref 7–25)
CO2: 24 mmol/L (ref 20–32)
Calcium: 10 mg/dL (ref 8.6–10.4)
Chloride: 104 mmol/L (ref 98–110)
Creat: 0.86 mg/dL (ref 0.60–0.88)
GFR, Est African American: 72 mL/min/{1.73_m2} (ref >=60)
GFR, Est Non African American: 62 mL/min/{1.73_m2} (ref >=60)
Globulin: 2.9 g/dL (calc) (ref 1.9–3.7)
Glucose, Bld: 104 mg/dL — ABNORMAL HIGH (ref 65–99)
Potassium: 4.2 mmol/L (ref 3.5–5.3)
Sodium: 141 mmol/L (ref 135–146)
Total Bilirubin: 0.8 mg/dL (ref 0.2–1.2)
Total Protein: 7.2 g/dL (ref 6.1–8.1)

## 2019-11-06 ENCOUNTER — Telehealth: Payer: Self-pay | Admitting: Internal Medicine

## 2019-11-16 ENCOUNTER — Other Ambulatory Visit: Payer: Self-pay

## 2019-11-18 NOTE — Telephone Encounter (Signed)
Patient called and stated the pharmacy has not received the RX. Can you please resend.

## 2019-11-19 DIAGNOSIS — Z1231 Encounter for screening mammogram for malignant neoplasm of breast: Secondary | ICD-10-CM | POA: Diagnosis not present

## 2019-11-19 LAB — HM MAMMOGRAPHY

## 2019-11-23 NOTE — Telephone Encounter (Signed)
Informed pt that it is too early for her to receive a new Rx and pharmacy will send next Rx out on 10/25

## 2019-11-24 ENCOUNTER — Encounter: Payer: Self-pay | Admitting: Internal Medicine

## 2020-01-15 ENCOUNTER — Other Ambulatory Visit: Payer: Self-pay | Admitting: Internal Medicine

## 2020-02-23 ENCOUNTER — Other Ambulatory Visit: Payer: Self-pay | Admitting: *Deleted

## 2020-02-23 MED ORDER — TRUE METRIX GO GLUCOSE METER W/DEVICE KIT: PACK | 0 refills | Status: DC

## 2020-02-23 MED ORDER — TRUE METRIX BLOOD GLUCOSE TEST VI STRP: ORAL_STRIP | 3 refills | Status: DC

## 2020-02-23 MED ORDER — TRUE METRIX LEVEL 1 LOW VI SOLN: 1.0000 | 3 refills | Status: DC

## 2020-02-23 MED ORDER — BD SWAB SINGLE USE REGULAR PADS: 1.0000 | MEDICATED_PAD | 3 refills | Status: DC

## 2020-02-23 MED ORDER — TRUEPLUS LANCETS 33G MISC: 3 refills | Status: DC

## 2020-02-23 NOTE — Addendum Note (Signed)
Addended by: Deatra James on: 02/23/2020 12:41 PM   Modules accepted: Orders

## 2020-03-15 ENCOUNTER — Other Ambulatory Visit: Payer: Self-pay | Admitting: Internal Medicine

## 2020-03-21 ENCOUNTER — Telehealth: Payer: Self-pay | Admitting: Internal Medicine

## 2020-03-21 NOTE — Telephone Encounter (Signed)
    Pt c/o medication issue:  1. Name of Medication:   REPATHA SURECLICK 140 MG/ML SOAJ    2. How are you currently taking this medication (dosage and times per day)? INJECT 1 PEN INTO THE SKIN EVERY 14 DAYS  3. Are you having a reaction (difficulty breathing--STAT)?   4. What is your medication issue? Pt said he grant expired and need help to renew it

## 2020-03-21 NOTE — Telephone Encounter (Signed)
Called and got pt approved for [employer] for repatha and gave her id bin pcn and group # to take to pharmacy so that they can get it covered at no cost to the pt. They voiced understanding and gratitude

## 2020-03-31 ENCOUNTER — Other Ambulatory Visit: Payer: Self-pay | Admitting: Internal Medicine

## 2020-04-03 ENCOUNTER — Other Ambulatory Visit: Payer: Self-pay | Admitting: Internal Medicine

## 2020-04-09 ENCOUNTER — Other Ambulatory Visit: Payer: Self-pay | Admitting: Internal Medicine

## 2020-04-15 ENCOUNTER — Telehealth: Payer: Self-pay | Admitting: Internal Medicine

## 2020-04-15 NOTE — Telephone Encounter (Signed)
Terri King w/ [facility] called and is needing clarification on the directions for metoprolol succinate (TOPROL-XL) 50 MG 24 hr tablet. Please call [phone]

## 2020-04-15 NOTE — Telephone Encounter (Signed)
Called [employer] spoke w/pharmacist inform per chart take 1 at bedtime.Marland KitchenRaechel Chute

## 2020-04-28 ENCOUNTER — Other Ambulatory Visit: Payer: Self-pay | Admitting: Internal Medicine

## 2020-05-10 ENCOUNTER — Ambulatory Visit (INDEPENDENT_AMBULATORY_CARE_PROVIDER_SITE_OTHER): Payer: Medicare HMO | Admitting: Internal Medicine

## 2020-05-10 ENCOUNTER — Encounter: Payer: Self-pay | Admitting: Internal Medicine

## 2020-05-10 ENCOUNTER — Other Ambulatory Visit: Payer: Self-pay

## 2020-05-10 VITALS — BP 132/60 | HR 62 | Temp 98.7°F | Ht 64.0 in | Wt 166.6 lb

## 2020-05-10 DIAGNOSIS — E782 Mixed hyperlipidemia: Secondary | ICD-10-CM

## 2020-05-10 DIAGNOSIS — I10 Essential (primary) hypertension: Secondary | ICD-10-CM | POA: Diagnosis not present

## 2020-05-10 DIAGNOSIS — K219 Gastro-esophageal reflux disease without esophagitis: Secondary | ICD-10-CM

## 2020-05-10 DIAGNOSIS — R1310 Dysphagia, unspecified: Secondary | ICD-10-CM

## 2020-05-10 DIAGNOSIS — F4323 Adjustment disorder with mixed anxiety and depressed mood: Secondary | ICD-10-CM | POA: Diagnosis not present

## 2020-05-10 LAB — COMPREHENSIVE METABOLIC PANEL
ALT: 11 U/L (ref 0–35)
AST: 21 U/L (ref 0–37)
Albumin: 4.2 g/dL (ref 3.5–5.2)
Alkaline Phosphatase: 98 U/L (ref 39–117)
BUN: 19 mg/dL (ref 6–23)
CO2: 28 mEq/L (ref 19–32)
Calcium: 10.1 mg/dL (ref 8.4–10.5)
Chloride: 102 mEq/L (ref 96–112)
Creatinine, Ser: 0.89 mg/dL (ref 0.40–1.20)
GFR: 59.63 mL/min — ABNORMAL LOW (ref >60.00)
Glucose, Bld: 111 mg/dL — ABNORMAL HIGH (ref 70–99)
Potassium: 4 mEq/L (ref 3.5–5.1)
Sodium: 138 mEq/L (ref 135–145)
Total Bilirubin: 0.6 mg/dL (ref 0.2–1.2)
Total Protein: 7.4 g/dL (ref 6.0–8.3)

## 2020-05-10 LAB — CBC WITH DIFFERENTIAL/PLATELET
Basophils Absolute: 0.1 10*3/uL (ref 0.0–0.1)
Basophils Relative: 0.8 % (ref 0.0–3.0)
Eosinophils Absolute: 0.3 10*3/uL (ref 0.0–0.7)
Eosinophils Relative: 3.4 % (ref 0.0–5.0)
HCT: 39.7 % (ref 36.0–46.0)
Hemoglobin: 13.7 g/dL (ref 12.0–15.0)
Lymphocytes Relative: 19.4 % (ref 12.0–46.0)
Lymphs Abs: 1.5 10*3/uL (ref 0.7–4.0)
MCHC: 34.4 g/dL (ref 30.0–36.0)
MCV: 91.6 fl (ref 78.0–100.0)
Monocytes Absolute: 0.9 10*3/uL (ref 0.1–1.0)
Monocytes Relative: 11.7 % (ref 3.0–12.0)
Neutro Abs: 4.9 10*3/uL (ref 1.4–7.7)
Neutrophils Relative %: 64.7 % (ref 43.0–77.0)
Platelets: 229 10*3/uL (ref 150.0–400.0)
RBC: 4.34 Mil/uL (ref 3.87–5.11)
RDW: 13.5 % (ref 11.5–15.5)
WBC: 7.5 10*3/uL (ref 4.0–10.5)

## 2020-05-10 LAB — LIPID PANEL
Cholesterol: 146 mg/dL (ref 0–200)
HDL: 62.9 mg/dL (ref >39.00)
LDL Cholesterol: 58 mg/dL (ref 0–99)
NonHDL: 82.82
Total CHOL/HDL Ratio: 2
Triglycerides: 122 mg/dL (ref 0.0–149.0)
VLDL: 24.4 mg/dL (ref 0.0–40.0)

## 2020-05-10 LAB — URINALYSIS
Bilirubin Urine: NEGATIVE
Hgb urine dipstick: NEGATIVE
Ketones, ur: NEGATIVE
Leukocytes,Ua: NEGATIVE
Nitrite: NEGATIVE
Specific Gravity, Urine: >=1.03 — AB (ref 1.000–1.030)
Total Protein, Urine: NEGATIVE
Urine Glucose: NEGATIVE
Urobilinogen, UA: 0.2 (ref 0.0–1.0)
pH: 6 (ref 5.0–8.0)

## 2020-05-10 LAB — TSH: TSH: 1.7 u[IU]/mL (ref 0.35–4.50)

## 2020-05-10 NOTE — Assessment & Plan Note (Signed)
No relapse - on Pepcid

## 2020-05-10 NOTE — Progress Notes (Signed)
Subjective:  Patient ID: Terri King, female    DOB: Jun 08, 1934  Age: 86 y.o. MRN: [id number]  CC: Follow-up (6 month f/u)   HPI Terri King presents for HTN, insomnia, GERD f/u C/o anxiety at times  Outpatient Medications Prior to Visit  Medication Sig Dispense Refill  . amLODipine (NORVASC) 5 MG tablet TAKE 1 TABLET EVERY DAY 90 tablet 3  . Ascorbic Acid (VITAMIN C PO) Take 1 tablet by mouth daily.    . Blood Glucose Monitoring Suppl (TRUE METRIX METER) w/Device KIT USE AS DIRECTED 1 kit 0  . famotidine (PEPCID) 40 MG tablet TAKE 1 TABLET (40 MG TOTAL) BY MOUTH DAILY. ANNUAL APPT DUE IN JUNE MUST SEE PROVIDER FOR FUTURE REFILLS 90 tablet 3  . feeding supplement, ENSURE COMPLETE, (ENSURE COMPLETE) LIQD Take 237 mLs by mouth daily. 7110 mL 11  . gabapentin (NEURONTIN) 100 MG capsule TAKE 1 CAPSULE THREE TIMES DAILY 270 capsule 3  . glucose blood (TRUE METRIX BLOOD GLUCOSE TEST) test strip Use to check blood sugars twice a day 180 each 3  . ibuprofen (IBU) 600 MG tablet TAKE 1 TABLET EVERY DAY AS NEEDED FOR  MODERATE  PAIN 90 tablet 0  . Krill Oil (MAXIMUM RED KRILL) 300 MG CAPS 1 po qd 100 capsule 3  . metoprolol succinate (TOPROL-XL) 50 MG 24 hr tablet TAKE 1 TABLET (50 MG TOTAL) BY MOUTH AT BEDTIME / WITH OR IMMEDIATELY FOLLOWING A MEAL AS DIRECTED 90 tablet 3  . Multiple Vitamins-Minerals (MULTIPLE VITAMINS/WOMENS PO) Take by mouth daily.     . nitroGLYCERIN (NITROSTAT) 0.4 MG SL tablet Place 1 tablet (0.4 mg total) under the tongue every 5 (five) minutes as needed for chest pain. 25 tablet 6  . potassium chloride (KLOR-CON) 8 MEQ tablet Take 1 tablet (8 mEq total) by mouth 2 (two) times daily. 180 tablet 3  . REPATHA SURECLICK 962 MG/ML SOAJ INJECT 1 PEN INTO THE SKIN EVERY 14 DAYS 6 mL 3  . temazepam (RESTORIL) 30 MG capsule TAKE 1 CAPSULE AT BEDTIME AS NEEDED  FOR  SLEEP 90 capsule 1  . triamterene-hydrochlorothiazide (MAXZIDE-25) 37.5-25 MG tablet TAKE 1 TABLET EVERY DAY  90 tablet 3  . TRUEplus Lancets 33G MISC Use to check blood sugars twice a day 180 each 3  . valACYclovir (VALTREX) 1000 MG tablet Take 1 tablet (1,000 mg total) by mouth 3 (three) times daily. 21 tablet 0  . vitamin B-12 (CYANOCOBALAMIN) 100 MCG tablet Take 100 mcg by mouth daily.    . Vitamin E 400 units TABS Take by mouth daily.     . Alcohol Swabs (B-D SINGLE USE SWABS REGULAR) PADS 1 each by Does not apply route as directed. Dx: R73.9 (Patient not taking: Reported on 05/10/2020) 180 each 3  . Blood Glucose Calibration (TRUE METRIX LEVEL 1) Low SOLN 1 each by In Vitro route as directed. Dx: R73.9 (Patient not taking: Reported on 05/10/2020) 3 each 3  . cholecalciferol (VITAMIN D) 400 UNITS TABS Take 400 Units by mouth daily. (Patient not taking: Reported on 05/10/2020)    . escitalopram (LEXAPRO) 10 MG tablet Take 1 tablet (10 mg total) by mouth daily. Annual appt due in March must see provider for future refills (Patient not taking: Reported on 05/10/2020) 90 tablet 0   No facility-administered medications prior to visit.    ROS: Review of Systems  Constitutional: Negative for activity change, appetite change, chills, fatigue and unexpected weight change.  HENT: Negative for congestion, mouth  sores and sinus pressure.   Eyes: Negative for visual disturbance.  Respiratory: Negative for cough and chest tightness.   Gastrointestinal: Negative for abdominal pain and nausea.  Genitourinary: Negative for difficulty urinating, frequency and vaginal pain.  Musculoskeletal: Positive for arthralgias and gait problem. Negative for back pain.  Skin: Negative for pallor and rash.  Neurological: Negative for dizziness, tremors, weakness, numbness and headaches.  Psychiatric/Behavioral: Positive for decreased concentration. Negative for confusion, sleep disturbance and suicidal ideas. The patient is nervous/anxious.     Objective:  BP 132/60 (BP Location: Left Arm)   Pulse 62   Temp 98.7 F (37.1 C)  (Oral)   Ht '5\' 4"'  (1.626 m)   Wt 166 lb 9.6 oz (75.6 kg)   SpO2 98%   BMI 28.60 kg/m   BP Readings from Last 3 Encounters:  05/10/20 132/60  10/30/19 (!) 144/58  05/14/19 118/78    Wt Readings from Last 3 Encounters:  05/10/20 166 lb 9.6 oz (75.6 kg)  10/30/19 162 lb (73.5 kg)  05/14/19 169 lb (76.7 kg)    Physical Exam Constitutional:      General: She is not in acute distress.    Appearance: She is well-developed.  HENT:     Head: Normocephalic.     Right Ear: External ear normal.     Left Ear: External ear normal.     Nose: Nose normal.  Eyes:     General:        Right eye: No discharge.        Left eye: No discharge.     Conjunctiva/sclera: Conjunctivae normal.     Pupils: Pupils are equal, round, and reactive to light.  Neck:     Thyroid: No thyromegaly.     Vascular: No JVD.     Trachea: No tracheal deviation.  Cardiovascular:     Rate and Rhythm: Normal rate and regular rhythm.     Heart sounds: Normal heart sounds.  Pulmonary:     Effort: No respiratory distress.     Breath sounds: No stridor. No wheezing.  Abdominal:     General: Bowel sounds are normal. There is no distension.     Palpations: Abdomen is soft. There is no mass.     Tenderness: There is no abdominal tenderness. There is no guarding or rebound.  Musculoskeletal:        General: No tenderness.     Cervical back: Normal range of motion and neck supple.  Lymphadenopathy:     Cervical: No cervical adenopathy.  Skin:    Findings: No erythema or rash.  Neurological:     Cranial Nerves: No cranial nerve deficit.     Motor: No abnormal muscle tone.     Coordination: Coordination abnormal.     Gait: Gait abnormal.     Deep Tendon Reflexes: Reflexes normal.  Psychiatric:        Behavior: Behavior normal.        Thought Content: Thought content normal.        Judgment: Judgment normal.   using a cane  Lab Results  Component Value Date   WBC 6.3 05/14/2019   HGB 14.0 05/14/2019   HCT  40.9 05/14/2019   PLT 254.0 05/14/2019   GLUCOSE 104 (H) 10/30/2019   CHOL 147 05/14/2019   TRIG 124.0 05/14/2019   HDL 59.50 05/14/2019   LDLDIRECT 186.1 03/24/2013   LDLCALC 62 05/14/2019   ALT 13 10/30/2019   AST 26 10/30/2019   NA 141  10/30/2019   K 4.2 10/30/2019   CL 104 10/30/2019   CREATININE 0.86 10/30/2019   BUN 15 10/30/2019   CO2 24 10/30/2019   TSH 0.97 10/30/2019   INR 1.44 01/25/2012   HGBA1C 6.1 05/10/2016    DG Bone Density  Result Date: 10/05/2015 Date of study: 10/03/2015 Exam: DUAL X-RAY ABSORPTIOMETRY (DXA) FOR BONE MINERAL DENSITY (BMD) Instrument: [employer] Provider: PCP Indication: screening for osteoporosis Comparison: none (please note that it is not possible to compare data from different instruments) Clinical data: Pt is a 86 y.o. female with history of rib fracture. On vitamin D supplements. Results:  Lumbar spine (L2, L4) Femoral neck (FN) Change in BMD from previous DXA test (%) -2.9  RFN:-2.6 LFN:-1.8  (*) statistically significant Assessment: Patient has OSTEOPOROSIS according to the [facility] classification for osteoporosis (see below). Fracture risk: high Comments: the technical quality of the study is good, however, the spine is scoliotic and arthritic. Calcium accumulation in arthritic sites can confound the results of the bone density scan. L1 and L3 vertebrae had to be excluded from analysis. Evaluation for secondary causes should be considered if clinically indicated. Recommend optimizing calcium (1200 mg/day) and vitamin D (800 IU/day). Followup: Repeat BMD is appropriate after 1-2 years WHO criteria for diagnosis of osteoporosis in postmenopausal women and in men 11 y/o or older: - normal: T-score -1.0 to + 1.0 - osteopenia/low bone density: T-score between -2.5 and -1.0 - osteoporosis: T-score below -2.5 - severe osteoporosis: T-score below -2.5 with history of fragility fracture Note: although not part of the WHO classification, the  presence of a fragility fracture, regardless of the T-score, should be considered diagnostic of osteoporosis, provided other causes for the fracture have been excluded. Treatment: The National Osteoporosis Foundation recommends that treatment be considered in postmenopausal women and men age 49 or older with: 1. Hip or vertebral (clinical or morphometric) fracture 2. T-score of - 2.5 or lower at the spine or hip 3. 10-year fracture probability by FRAX of at least 20% for a major osteoporotic fracture and 3% for a hip fracture Philemon Kingdom, MD [facility]    Assessment & Plan:   There are no diagnoses linked to this encounter.   No orders of the defined types were placed in this encounter.    Follow-up: No follow-ups on file.  Walker Kehr, MD

## 2020-05-10 NOTE — Addendum Note (Signed)
Addended by: Madelon Lips on: 05/10/2020 10:50 AM   Modules accepted: Orders

## 2020-05-10 NOTE — Assessment & Plan Note (Signed)
On Pepcid 

## 2020-05-10 NOTE — Assessment & Plan Note (Signed)
  Lexapro Temazepam prn insomnia Valerian root prn anxiety

## 2020-05-10 NOTE — Patient Instructions (Signed)
°  Try Valerian root prn anxiety

## 2020-05-10 NOTE — Assessment & Plan Note (Signed)
On Norvasc 

## 2020-05-16 ENCOUNTER — Ambulatory Visit: Payer: Medicare HMO

## 2020-05-25 ENCOUNTER — Other Ambulatory Visit: Payer: Self-pay | Admitting: Internal Medicine

## 2020-05-26 MED ORDER — TEMAZEPAM 30 MG PO CAPS: ORAL_CAPSULE | ORAL | 1 refills | Status: DC

## 2020-05-27 ENCOUNTER — Ambulatory Visit: Payer: Medicare HMO

## 2020-05-31 ENCOUNTER — Other Ambulatory Visit: Payer: Self-pay

## 2020-05-31 ENCOUNTER — Ambulatory Visit (INDEPENDENT_AMBULATORY_CARE_PROVIDER_SITE_OTHER): Payer: Medicare HMO

## 2020-05-31 VITALS — BP 140/60 | HR 74 | Temp 98.0°F | Ht 64.0 in | Wt 164.8 lb

## 2020-05-31 DIAGNOSIS — Z0000 Encounter for general adult medical examination without abnormal findings: Secondary | ICD-10-CM

## 2020-05-31 NOTE — Progress Notes (Addendum)
Subjective:   Terri King is a 86 y.o. female who presents for Medicare Annual (Subsequent) preventive examination.  Review of Systems    No ROS. Medicare Wellness Visit. Additional risk factors are reflected in social history. Cardiac Risk Factors include: advanced age (>71mn, >>19women);hypertension;family history of premature cardiovascular disease     Objective:    Today's Vitals   05/31/20 0942  BP: 140/60  Pulse: 74  Temp: 98 F (36.7 C)  SpO2: 98%  Weight: 164 lb 12.8 oz (74.8 kg)  Height: '5\' 4"'  (1.626 m)   Body mass index is 28.29 kg/m.  Advanced Directives 05/31/2020 05/14/2019 02/08/2017 08/27/2016 04/29/2015 01/25/2012 01/23/2012  Does Patient Have a Medical Advance Directive? Yes No No Yes No Patient has advance directive, copy not in chart -  Type of Advance Directive Healthcare Power of [place] will -  Does patient want to make changes to medical advance directive? No - Patient declined - - - - - -  Copy of [place] Chart? No - copy requested - - - - Copy requested from family -  Would patient like information on creating a medical advance directive? - No - Patient declined Yes (ED - Information included in AVS) - No - patient declined information - -  Pre-existing out of facility DNR order (yellow form or pink MOST form) - - - - - - No    Current Medications (verified) Outpatient Encounter Medications as of 05/31/2020  Medication Sig   amLODipine (NORVASC) 5 MG tablet TAKE 1 TABLET EVERY DAY   Ascorbic Acid (VITAMIN C PO) Take 1 tablet by mouth daily.   Blood Glucose Monitoring Suppl (TRUE METRIX METER) w/Device KIT USE AS DIRECTED   cholecalciferol (VITAMIN D) 400 UNITS TABS Take 400 Units by mouth daily.   escitalopram (LEXAPRO) 10 MG tablet Take 1 tablet (10 mg total) by mouth daily. Annual appt due in March must see provider for future refills   famotidine  (PEPCID) 40 MG tablet TAKE 1 TABLET (40 MG TOTAL) BY MOUTH DAILY. ANNUAL APPT DUE IN JUNE MUST SEE PROVIDER FOR FUTURE REFILLS   feeding supplement, ENSURE COMPLETE, (ENSURE COMPLETE) LIQD Take 237 mLs by mouth daily.   gabapentin (NEURONTIN) 100 MG capsule TAKE 1 CAPSULE THREE TIMES DAILY   glucose blood (TRUE METRIX BLOOD GLUCOSE TEST) test strip Use to check blood sugars twice a day   ibuprofen (IBU) 600 MG tablet TAKE 1 TABLET EVERY DAY AS NEEDED FOR MODERATE PAIN   Krill Oil (MAXIMUM RED KRILL) 300 MG CAPS 1 po qd   metoprolol succinate (TOPROL-XL) 50 MG 24 hr tablet TAKE 1 TABLET (50 MG TOTAL) BY MOUTH AT BEDTIME / WITH OR IMMEDIATELY FOLLOWING A MEAL AS DIRECTED   Multiple Vitamins-Minerals (MULTIPLE VITAMINS/WOMENS PO) Take by mouth daily.    nitroGLYCERIN (NITROSTAT) 0.4 MG SL tablet Place 1 tablet (0.4 mg total) under the tongue every 5 (five) minutes as needed for chest pain.   potassium chloride (KLOR-CON) 8 MEQ tablet Take 1 tablet (8 mEq total) by mouth 2 (two) times daily.   potassium chloride SA (KLOR-CON) 20 MEQ tablet    REPATHA SURECLICK 1500MG/ML SOAJ INJECT 1 PEN INTO THE SKIN EVERY 14 DAYS   temazepam (RESTORIL) 30 MG capsule TAKE 1 CAPSULE AT BEDTIME AS NEEDED  FOR  SLEEP   triamterene-hydrochlorothiazide (MAXZIDE-25) 37.5-25 MG tablet TAKE 1 TABLET EVERY DAY   TRUEplus  Lancets 33G MISC Use to check blood sugars twice a day   valACYclovir (VALTREX) 1000 MG tablet Take 1 tablet (1,000 mg total) by mouth 3 (three) times daily.   vitamin B-12 (CYANOCOBALAMIN) 100 MCG tablet Take 100 mcg by mouth daily.   Vitamin E 400 units TABS Take by mouth daily.    Alcohol Swabs (B-D SINGLE USE SWABS REGULAR) PADS 1 each by Does not apply route as directed. Dx: R73.9 (Patient not taking: Reported on 05/10/2020)   Blood Glucose Calibration (TRUE METRIX LEVEL 1) Low SOLN 1 each by In Vitro route as directed. Dx: R73.9 (Patient not taking: Reported on 05/10/2020)   No facility-administered  encounter medications on file as of 05/31/2020.    Allergies (verified) Codeine, Crestor [rosuvastatin], Dexilant [dexlansoprazole], Ibuprofen, Simvastatin, Tylenol [acetaminophen], and Iodine   History: Past Medical History:  Diagnosis Date   Aortic stenosis    Stage B Moderate AS (Echocardiogram 03/2019: EF 65-70, no RWMA, mild LVH, Gr 1 DD, RVSP 37.6 (mild elevation), mild MR, mod AI, mod AS (mean 28 mmHg/peak 52.3 mmHg/LVOT AV VTI ratio: 0.37)   CAD (coronary artery disease)    DR Harrington Challenger; by notes normal cath in 1986, normal stress 2013   Family history of anesthesia complication    NIENCE had sensitivity   History of pneumonia 2008   HTN (hypertension)    dr Alain Marion   Hyperlipemia    MI (myocardial infarction) ([place])    DR Harrington Challenger   Osteoarthritis    Shortness of breath    Past Surgical History:  Procedure Laterality Date   JOINT REPLACEMENT  12/13   L TKR    KNEE ARTHROSCOPY     left   PARTIAL HYSTERECTOMY     TONSILLECTOMY     TOTAL KNEE ARTHROPLASTY  01/23/2012   Procedure: TOTAL KNEE ARTHROPLASTY;  Surgeon: Ninetta Lights, MD;  Location: [facility];  Service: Orthopedics;  Laterality: Left;   TOTAL KNEE ARTHROPLASTY  12/'05/2011   left knee   Family History  Problem Relation Age of Onset   Glaucoma Father    Hypertension Mother    Hypertension Other    Social History   Socioeconomic History   Marital status: Widowed    Spouse name: Not on file   Number of children: Not on file   Years of education: Not on file   Highest education level: Not on file  Occupational History   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)  Tobacco Use   Smoking status: Never Smoker   Smokeless tobacco: Never Used  Scientific laboratory technician Use: Never used  Substance and Sexual Activity   Alcohol use: No   Drug use: No   Sexual activity: Never  Other Topics Concern   Not on file  Social History Narrative   Not on file   Social Determinants of Health    Financial Resource Strain: Low Risk    Difficulty of Paying Living Expenses: Not hard at all  Food Insecurity: No Food Insecurity   Worried About Charity fundraiser in the Last Year: Never true   Center in the Last Year: Never true  Transportation Needs: No Transportation Needs   Lack of Transportation (Medical): No   Lack of Transportation (Non-Medical): No  Physical Activity: Insufficiently Active   Days of Exercise per Week: 7 days   Minutes of Exercise per Session: 20 min  Stress: No Stress Concern Present   Feeling of Stress :  Not at all  Social Connections: Moderately Integrated   Frequency of Communication with Friends and Family: More than three times a week   Frequency of Social Gatherings with Friends and Family: Once a week   Attends Religious Services: 1 to 4 times per year   Active Member of [employer] or Organizations: No   Attends Archivist Meetings: 1 to 4 times per year   Marital Status: Widowed    Tobacco Counseling Counseling given: Not Answered   Clinical Intake:  Pre-visit preparation completed: Yes  Pain : No/denies pain     BMI - recorded: 28.29 Nutritional Status: BMI 25 -29 Overweight Nutritional Risks: None Diabetes: No  How often do you need to have someone help you when you read instructions, pamphlets, or other written materials from your doctor or pharmacy?: 1 - Never What is the last grade level you completed in school?: [place]; 2 years at [employer]  Diabetic? no  Interpreter Needed?: No  Information entered by :: Lisette Abu, LPN.   Activities of Daily Living In your present state of health, do you have any difficulty performing the following activities: 05/31/2020  Hearing? N  Vision? N  Difficulty concentrating or making decisions? N  Walking or climbing stairs? N  Dressing or bathing? N  Doing errands, shopping? N  Preparing Food and eating ? N  Using the Toilet? N  In the past six months,  have you accidently leaked urine? N  Do you have problems with loss of bowel control? N  Managing your Medications? N  Managing your Finances? N  Housekeeping or managing your Housekeeping? N  Some recent data might be hidden    Patient Care Team: [name], Evie Lacks, MD as PCP - General Fay Records, MD as PCP - Cardiology (Cardiology) Fay Records, MD (Cardiology)  Indicate any recent Medical Services you may have received from other than [facility] providers in the past year (date may be approximate).     Assessment:   This is a routine wellness examination for Cheree.  Hearing/Vision screen No exam data present  Dietary issues and exercise activities discussed: Current Exercise Habits: Home exercise routine, Type of exercise: walking, Time (Minutes): 20, Frequency (Times/Week): 7, Weekly Exercise (Minutes/Week): 140, Intensity: Mild, Exercise limited by: cardiac condition(s);orthopedic condition(s);respiratory conditions(s)  Goals       Client understands the importance of follow-up with providers by attending scheduled visits      Wants to exercise more      Patient Stated      Maintain Current health status by increasing my physical activity and eating healthy. Start a small pot garden.      Patient Stated (pt-stated)      Continue to stay as independent and active as possible.       Depression Screen PHQ 2/9 Scores 05/31/2020 05/14/2019 08/07/2018 02/08/2017 08/14/2016 06/14/2015 05/27/2014  PHQ - 2 Score 0 0 0 2 0 0 1  PHQ- 9 Score - - - 8 - - -    Fall Risk Fall Risk  05/31/2020 05/14/2019 08/07/2018 02/08/2017 08/14/2016  Falls in the past year? 1 0 0 No Yes  Number falls in past yr: 1 0 - - 1  Injury with Fall? 1 0 - - Yes  Risk for fall due to : Impaired balance/gait;History of fall(s) Impaired balance/gait - - -  Follow up Falls evaluation completed Falls evaluation completed;Falls prevention discussed Falls evaluation completed - -    FALL RISK PREVENTION  PERTAINING TO THE HOME:  Any stairs in or around the home? Yes  If so, are there any without handrails? No  Home free of loose throw rugs in walkways, pet beds, electrical cords, etc? Yes  Adequate lighting in your home to reduce risk of falls? Yes   ASSISTIVE DEVICES UTILIZED TO PREVENT FALLS:  Life alert? No  Use of a cane, walker or w/c? Yes  Grab bars in the bathroom? Yes  Shower chair or bench in shower? Yes  Elevated toilet seat or a handicapped toilet? Yes   TIMED UP AND GO:  Was the test performed? No .  Length of time to ambulate 10 feet: 0 sec.   Gait slow and steady with assistive device  Cognitive Function: Normal cognitive status assessed by direct observation by this Nurse Health Advisor. No abnormalities found.   MMSE - Mini Mental State Exam 02/08/2017  Orientation to time 5  Orientation to Place 5  Registration 3  Attention/ Calculation 5  Recall 1  Language- name 2 objects 2  Language- repeat 1  Language- follow 3 step command 3  Language- read & follow direction 1  Write a sentence 1  Copy design 1  Total score 28     6CIT Screen 05/14/2019  What Year? 0 points  What month? 0 points  What time? 0 points  Count back from 20 0 points  Months in reverse 2 points  Repeat phrase 0 points  Total Score 2    Immunizations Immunization History  Administered Date(s) Administered   Fluad Quad(high Dose 65+) 11/10/2018   Influenza, High Dose Seasonal PF 12/03/2012, 11/01/2015, 11/01/2016, 11/13/2017   Influenza,inj,Quad PF,6+ Mos 10/22/2013, 10/30/2019   Influenza-Unspecified 11/28/2014   Pneumococcal Conjugate-13 11/01/2015   Pneumococcal Polysaccharide-23 02/17/2009, 01/24/2012, 11/10/2018   Td 02/09/2010   Tdap 10/30/2019   Zoster 04/20/2013    TDAP status: Up to date  Flu Vaccine status: Up to date  Pneumococcal vaccine status: Up to date  Covid-19 vaccine status: Completed vaccines  Qualifies for Shingles Vaccine? Yes   Zostavax  completed Yes   Shingrix Completed?: No.    Education has been provided regarding the importance of this vaccine. Patient has been advised to call insurance company to determine out of pocket expense if they have not yet received this vaccine. Advised may also receive vaccine at local pharmacy or Health Dept. Verbalized acceptance and understanding.  Screening Tests Health Maintenance  Topic Date Due   COVID-19 Vaccine (1) Never done   INFLUENZA VACCINE  09/19/2020   TETANUS/TDAP  10/29/2029   DEXA SCAN  Completed   PNA vac Low Risk Adult  Completed   HPV VACCINES  Aged Out    Health Maintenance  Health Maintenance Due  Topic Date Due   COVID-19 Vaccine (1) Never done    Colorectal cancer screening: No longer required.   Mammogram status: Completed 11/19/2019. Repeat every year  Bone Density status: Completed 10/03/2015. Results reflect: Bone density results: OSTEOPENIA. Repeat every 0 years.  Lung Cancer Screening: (Low Dose CT Chest recommended if Age 23-80 years, 30 pack-year currently smoking OR have quit w/in 15years.) does not qualify.   Lung Cancer Screening Referral: no  Additional Screening:  Hepatitis C Screening: does not qualify; Completed no  Vision Screening: Recommended annual ophthalmology exams for early detection of glaucoma and other disorders of the eye. Is the patient up to date with their annual eye exam?  Yes  Who is the provider or what is the name  of the office in which the patient attends annual eye exams? [facility]  If pt is not established with a provider, would they like to be referred to a provider to establish care? No .   Dental Screening: Recommended annual dental exams for proper oral hygiene  Community Resource Referral / Chronic Care Management: CRR required this visit?  No   CCM required this visit?  No      Plan:     I have personally reviewed and noted the following in the patient's chart:   Medical and social  history Use of alcohol, tobacco or illicit drugs  Current medications and supplements Functional ability and status Nutritional status Physical activity Advanced directives List of other physicians Hospitalizations, surgeries, and ER visits in previous 12 months Vitals Screenings to include cognitive, depression, and falls Referrals and appointments  In addition, I have reviewed and discussed with patient certain preventive protocols, quality metrics, and best practice recommendations. A written personalized care plan for preventive services as well as general preventive health recommendations were provided to patient.     Sheral Flow, LPN   [id number]   Nurse Notes:  Medications reviewed with patient; no opioid use noted.  Medical screening examination/treatment/procedure(s) were performed by non-physician practitioner and as supervising physician I was immediately available for consultation/collaboration.  I agree with above. Lew Dawes, MD

## 2020-05-31 NOTE — Patient Instructions (Signed)
[name] , Thank you for taking time to come for your Medicare Wellness Visit. I appreciate your ongoing commitment to your health goals. Please review the following plan we discussed and let me know if I can assist you in the future.   Screening recommendations/referrals: Colonoscopy: not a candidate for colon cancer screening due to age Mammogram: 11/19/2019 Bone Density: 10/03/2015 (completed; no longer recommended) Recommended yearly ophthalmology/optometry visit for glaucoma screening and checkup Recommended yearly dental visit for hygiene and checkup  Vaccinations: Influenza vaccine: 10/30/2019 Pneumococcal vaccine: 11/01/2015, 11/10/2018 Tdap vaccine: 10/30/2019; due every 10 years Shingles vaccine: never done; can check with local pharmacy   Covid-19: Moderna 04/21/2019, 05/20/2019, 12/16/2019  Advanced directives: Please bring a copy of your health care power of attorney and living will to the office at your convenience.  Conditions/risks identified: Yes; Reviewed health maintenance screenings with patient today and relevant education, vaccines, and/or referrals were provided. Please continue to do your personal lifestyle choices by: daily care of teeth and gums, regular physical activity (goal should be 5 days a week for 30 minutes), eat a healthy diet, avoid tobacco and drug use, limiting any alcohol intake, taking a low-dose aspirin (if not allergic or have been advised by your provider otherwise) and taking vitamins and minerals as recommended by your provider. Continue doing brain stimulating activities (puzzles, reading, adult coloring books, staying active) to keep memory sharp. Continue to eat heart healthy diet (full of fruits, vegetables, whole grains, lean protein, water--limit salt, fat, and sugar intake) and increase physical activity as tolerated.  Next appointment: Please schedule your next Medicare Wellness Visit with your Nurse Health Advisor in 1 year by calling  [phone].   Preventive Care 59 Years and Older, Female Preventive care refers to lifestyle choices and visits with your health care provider that can promote health and wellness. What does preventive care include?  A yearly physical exam. This is also called an annual well check.  Dental exams once or twice a year.  Routine eye exams. Ask your health care provider how often you should have your eyes checked.  Personal lifestyle choices, including:  Daily care of your teeth and gums.  Regular physical activity.  Eating a healthy diet.  Avoiding tobacco and drug use.  Limiting alcohol use.  Practicing safe sex.  Taking low-dose aspirin every day.  Taking vitamin and mineral supplements as recommended by your health care provider. What happens during an annual well check? The services and screenings done by your health care provider during your annual well check will depend on your age, overall health, lifestyle risk factors, and family history of disease. Counseling  Your health care provider may ask you questions about your:  Alcohol use.  Tobacco use.  Drug use.  Emotional well-being.  Home and relationship well-being.  Sexual activity.  Eating habits.  History of falls.  Memory and ability to understand (cognition).  Work and work Astronomer.  Reproductive health. Screening  You may have the following tests or measurements:  Height, weight, and BMI.  Blood pressure.  Lipid and cholesterol levels. These may be checked every 5 years, or more frequently if you are over 15 years old.  Skin check.  Lung cancer screening. You may have this screening every year starting at age 46 if you have a 30-pack-year history of smoking and currently smoke or have quit within the past 15 years.  Fecal occult blood test (FOBT) of the stool. You may have this test every year starting at  age 38.  Flexible sigmoidoscopy or colonoscopy. You may have a  sigmoidoscopy every 5 years or a colonoscopy every 10 years starting at age 39.  Hepatitis C blood test.  Hepatitis B blood test.  Sexually transmitted disease (STD) testing.  Diabetes screening. This is done by checking your blood sugar (glucose) after you have not eaten for a while (fasting). You may have this done every 1-3 years.  Bone density scan. This is done to screen for osteoporosis. You may have this done starting at age 60.  Mammogram. This may be done every 1-2 years. Talk to your health care provider about how often you should have regular mammograms. Talk with your health care provider about your test results, treatment options, and if necessary, the need for more tests. Vaccines  Your health care provider may recommend certain vaccines, such as:  Influenza vaccine. This is recommended every year.  Tetanus, diphtheria, and acellular pertussis (Tdap, Td) vaccine. You may need a Td booster every 10 years.  Zoster vaccine. You may need this after age 79.  Pneumococcal 13-valent conjugate (PCV13) vaccine. One dose is recommended after age 67.  Pneumococcal polysaccharide (PPSV23) vaccine. One dose is recommended after age 43. Talk to your health care provider about which screenings and vaccines you need and how often you need them. This information is not intended to replace advice given to you by your health care provider. Make sure you discuss any questions you have with your health care provider. Document Released: 03/04/2015 Document Revised: 10/26/2015 Document Reviewed: 12/07/2014 Elsevier Interactive Patient Education  2017 [employer] Prevention in the Home Falls can cause injuries. They can happen to people of all ages. There are many things you can do to make your home safe and to help prevent falls. What can I do on the outside of my home?  Regularly fix the edges of walkways and driveways and fix any cracks.  Remove anything that might make you  trip as you walk through a door, such as a raised step or threshold.  Trim any bushes or trees on the path to your home.  Use bright outdoor lighting.  Clear any walking paths of anything that might make someone trip, such as rocks or tools.  Regularly check to see if handrails are loose or broken. Make sure that both sides of any steps have handrails.  Any raised decks and porches should have guardrails on the edges.  Have any leaves, snow, or ice cleared regularly.  Use sand or salt on walking paths during winter.  Clean up any spills in your garage right away. This includes oil or grease spills. What can I do in the bathroom?  Use night lights.  Install grab bars by the toilet and in the tub and shower. Do not use towel bars as grab bars.  Use non-skid mats or decals in the tub or shower.  If you need to sit down in the shower, use a plastic, non-slip stool.  Keep the floor dry. Clean up any water that spills on the floor as soon as it happens.  Remove soap buildup in the tub or shower regularly.  Attach bath mats securely with double-sided non-slip rug tape.  Do not have throw rugs and other things on the floor that can make you trip. What can I do in the bedroom?  Use night lights.  Make sure that you have a light by your bed that is easy to reach.  Do not use any  sheets or blankets that are too big for your bed. They should not hang down onto the floor.  Have a firm chair that has side arms. You can use this for support while you get dressed.  Do not have throw rugs and other things on the floor that can make you trip. What can I do in the kitchen?  Clean up any spills right away.  Avoid walking on wet floors.  Keep items that you use a lot in easy-to-reach places.  If you need to reach something above you, use a strong step stool that has a grab bar.  Keep electrical cords out of the way.  Do not use floor polish or wax that makes floors slippery. If  you must use wax, use non-skid floor wax.  Do not have throw rugs and other things on the floor that can make you trip. What can I do with my stairs?  Do not leave any items on the stairs.  Make sure that there are handrails on both sides of the stairs and use them. Fix handrails that are broken or loose. Make sure that handrails are as long as the stairways.  Check any carpeting to make sure that it is firmly attached to the stairs. Fix any carpet that is loose or worn.  Avoid having throw rugs at the top or bottom of the stairs. If you do have throw rugs, attach them to the floor with carpet tape.  Make sure that you have a light switch at the top of the stairs and the bottom of the stairs. If you do not have them, ask someone to add them for you. What else can I do to help prevent falls?  Wear shoes that:  Do not have high heels.  Have rubber bottoms.  Are comfortable and fit you well.  Are closed at the toe. Do not wear sandals.  If you use a stepladder:  Make sure that it is fully opened. Do not climb a closed stepladder.  Make sure that both sides of the stepladder are locked into place.  Ask someone to hold it for you, if possible.  Clearly mark and make sure that you can see:  Any grab bars or handrails.  First and last steps.  Where the edge of each step is.  Use tools that help you move around (mobility aids) if they are needed. These include:  Canes.  Walkers.  Scooters.  Crutches.  Turn on the lights when you go into a dark area. Replace any light bulbs as soon as they burn out.  Set up your furniture so you have a clear path. Avoid moving your furniture around.  If any of your floors are uneven, fix them.  If there are any pets around you, be aware of where they are.  Review your medicines with your doctor. Some medicines can make you feel dizzy. This can increase your chance of falling. Ask your doctor what other things that you can do to  help prevent falls. This information is not intended to replace advice given to you by your health care provider. Make sure you discuss any questions you have with your health care provider. Document Released: 12/02/2008 Document Revised: 07/14/2015 Document Reviewed: 03/12/2014 Elsevier Interactive Patient Education  2017 [employer].

## 2020-06-01 ENCOUNTER — Other Ambulatory Visit: Payer: Self-pay | Admitting: Internal Medicine

## 2020-06-29 ENCOUNTER — Other Ambulatory Visit: Payer: Self-pay | Admitting: Internal Medicine

## 2020-08-18 ENCOUNTER — Other Ambulatory Visit: Payer: Self-pay | Admitting: Internal Medicine

## 2020-08-19 ENCOUNTER — Emergency Department (HOSPITAL_COMMUNITY): Payer: Medicare HMO

## 2020-08-19 ENCOUNTER — Inpatient Hospital Stay (HOSPITAL_COMMUNITY)
Admission: EM | Admit: 2020-08-19 | Discharge: 2020-09-02 | DRG: 266 | Disposition: A | Discharge status: Skilled Nursing Facility | Payer: Medicare HMO | Attending: Internal Medicine | Admitting: Internal Medicine

## 2020-08-19 ENCOUNTER — Encounter (HOSPITAL_COMMUNITY): Payer: Self-pay | Admitting: Emergency Medicine

## 2020-08-19 ENCOUNTER — Other Ambulatory Visit: Payer: Self-pay

## 2020-08-19 DIAGNOSIS — S2242XA Multiple fractures of ribs, left side, initial encounter for closed fracture: Secondary | ICD-10-CM | POA: Diagnosis not present

## 2020-08-19 DIAGNOSIS — M25561 Pain in right knee: Secondary | ICD-10-CM | POA: Diagnosis not present

## 2020-08-19 DIAGNOSIS — I959 Hypotension, unspecified: Secondary | ICD-10-CM | POA: Diagnosis not present

## 2020-08-19 DIAGNOSIS — R55 Syncope and collapse: Secondary | ICD-10-CM | POA: Diagnosis present

## 2020-08-19 DIAGNOSIS — D72823 Leukemoid reaction: Secondary | ICD-10-CM | POA: Diagnosis not present

## 2020-08-19 DIAGNOSIS — M542 Cervicalgia: Secondary | ICD-10-CM | POA: Diagnosis not present

## 2020-08-19 DIAGNOSIS — R8271 Bacteriuria: Secondary | ICD-10-CM | POA: Diagnosis not present

## 2020-08-19 DIAGNOSIS — Z9889 Other specified postprocedural states: Secondary | ICD-10-CM | POA: Diagnosis not present

## 2020-08-19 DIAGNOSIS — I252 Old myocardial infarction: Secondary | ICD-10-CM

## 2020-08-19 DIAGNOSIS — S0990XA Unspecified injury of head, initial encounter: Secondary | ICD-10-CM | POA: Diagnosis not present

## 2020-08-19 DIAGNOSIS — E871 Hypo-osmolality and hyponatremia: Secondary | ICD-10-CM | POA: Diagnosis present

## 2020-08-19 DIAGNOSIS — Z952 Presence of prosthetic heart valve: Secondary | ICD-10-CM | POA: Diagnosis not present

## 2020-08-19 DIAGNOSIS — E785 Hyperlipidemia, unspecified: Secondary | ICD-10-CM | POA: Diagnosis present

## 2020-08-19 DIAGNOSIS — T424X5A Adverse effect of benzodiazepines, initial encounter: Secondary | ICD-10-CM | POA: Diagnosis not present

## 2020-08-19 DIAGNOSIS — B957 Other staphylococcus as the cause of diseases classified elsewhere: Secondary | ICD-10-CM | POA: Diagnosis not present

## 2020-08-19 DIAGNOSIS — Z79899 Other long term (current) drug therapy: Secondary | ICD-10-CM | POA: Diagnosis not present

## 2020-08-19 DIAGNOSIS — W1830XA Fall on same level, unspecified, initial encounter: Secondary | ICD-10-CM | POA: Diagnosis present

## 2020-08-19 DIAGNOSIS — I272 Pulmonary hypertension, unspecified: Secondary | ICD-10-CM | POA: Diagnosis present

## 2020-08-19 DIAGNOSIS — B0089 Other herpesviral infection: Secondary | ICD-10-CM | POA: Diagnosis not present

## 2020-08-19 DIAGNOSIS — I6389 Other cerebral infarction: Secondary | ICD-10-CM | POA: Diagnosis present

## 2020-08-19 DIAGNOSIS — R4182 Altered mental status, unspecified: Secondary | ICD-10-CM | POA: Diagnosis not present

## 2020-08-19 DIAGNOSIS — I10 Essential (primary) hypertension: Secondary | ICD-10-CM

## 2020-08-19 DIAGNOSIS — Z96652 Presence of left artificial knee joint: Secondary | ICD-10-CM | POA: Diagnosis present

## 2020-08-19 DIAGNOSIS — S72411D Displaced unspecified condyle fracture of lower end of right femur, subsequent encounter for closed fracture with routine healing: Secondary | ICD-10-CM | POA: Diagnosis not present

## 2020-08-19 DIAGNOSIS — E876 Hypokalemia: Secondary | ICD-10-CM | POA: Diagnosis not present

## 2020-08-19 DIAGNOSIS — M25562 Pain in left knee: Secondary | ICD-10-CM | POA: Diagnosis not present

## 2020-08-19 DIAGNOSIS — Z8249 Family history of ischemic heart disease and other diseases of the circulatory system: Secondary | ICD-10-CM | POA: Diagnosis not present

## 2020-08-19 DIAGNOSIS — I350 Nonrheumatic aortic (valve) stenosis: Secondary | ICD-10-CM | POA: Diagnosis not present

## 2020-08-19 DIAGNOSIS — S4992XA Unspecified injury of left shoulder and upper arm, initial encounter: Secondary | ICD-10-CM | POA: Diagnosis not present

## 2020-08-19 DIAGNOSIS — W19XXXA Unspecified fall, initial encounter: Secondary | ICD-10-CM | POA: Diagnosis not present

## 2020-08-19 DIAGNOSIS — E569 Vitamin deficiency, unspecified: Secondary | ICD-10-CM | POA: Diagnosis not present

## 2020-08-19 DIAGNOSIS — Z8673 Personal history of transient ischemic attack (TIA), and cerebral infarction without residual deficits: Secondary | ICD-10-CM

## 2020-08-19 DIAGNOSIS — Z743 Need for continuous supervision: Secondary | ICD-10-CM | POA: Diagnosis not present

## 2020-08-19 DIAGNOSIS — I639 Cerebral infarction, unspecified: Secondary | ICD-10-CM | POA: Diagnosis not present

## 2020-08-19 DIAGNOSIS — Y92009 Unspecified place in unspecified non-institutional (private) residence as the place of occurrence of the external cause: Secondary | ICD-10-CM

## 2020-08-19 DIAGNOSIS — S72002D Fracture of unspecified part of neck of left femur, subsequent encounter for closed fracture with routine healing: Secondary | ICD-10-CM | POA: Diagnosis not present

## 2020-08-19 DIAGNOSIS — G459 Transient cerebral ischemic attack, unspecified: Secondary | ICD-10-CM | POA: Diagnosis not present

## 2020-08-19 DIAGNOSIS — Z20822 Contact with and (suspected) exposure to covid-19: Secondary | ICD-10-CM | POA: Diagnosis present

## 2020-08-19 DIAGNOSIS — Z006 Encounter for examination for normal comparison and control in clinical research program: Secondary | ICD-10-CM | POA: Diagnosis not present

## 2020-08-19 DIAGNOSIS — D72829 Elevated white blood cell count, unspecified: Secondary | ICD-10-CM

## 2020-08-19 DIAGNOSIS — M79603 Pain in arm, unspecified: Secondary | ICD-10-CM

## 2020-08-19 DIAGNOSIS — S72415A Nondisplaced unspecified condyle fracture of lower end of left femur, initial encounter for closed fracture: Secondary | ICD-10-CM | POA: Diagnosis not present

## 2020-08-19 DIAGNOSIS — G8911 Acute pain due to trauma: Secondary | ICD-10-CM | POA: Diagnosis not present

## 2020-08-19 DIAGNOSIS — G4700 Insomnia, unspecified: Secondary | ICD-10-CM | POA: Diagnosis not present

## 2020-08-19 DIAGNOSIS — I700 Atherosclerosis of aorta: Secondary | ICD-10-CM | POA: Diagnosis not present

## 2020-08-19 DIAGNOSIS — M25511 Pain in right shoulder: Secondary | ICD-10-CM | POA: Diagnosis not present

## 2020-08-19 DIAGNOSIS — I2510 Atherosclerotic heart disease of native coronary artery without angina pectoris: Secondary | ICD-10-CM | POA: Diagnosis not present

## 2020-08-19 DIAGNOSIS — I352 Nonrheumatic aortic (valve) stenosis with insufficiency: Secondary | ICD-10-CM | POA: Diagnosis not present

## 2020-08-19 DIAGNOSIS — Z01818 Encounter for other preprocedural examination: Secondary | ICD-10-CM

## 2020-08-19 DIAGNOSIS — T43595A Adverse effect of other antipsychotics and neuroleptics, initial encounter: Secondary | ICD-10-CM | POA: Diagnosis not present

## 2020-08-19 DIAGNOSIS — S79929A Unspecified injury of unspecified thigh, initial encounter: Secondary | ICD-10-CM | POA: Diagnosis not present

## 2020-08-19 DIAGNOSIS — Z83511 Family history of glaucoma: Secondary | ICD-10-CM | POA: Diagnosis not present

## 2020-08-19 DIAGNOSIS — I6502 Occlusion and stenosis of left vertebral artery: Secondary | ICD-10-CM | POA: Diagnosis not present

## 2020-08-19 DIAGNOSIS — T426X5A Adverse effect of other antiepileptic and sedative-hypnotic drugs, initial encounter: Secondary | ICD-10-CM | POA: Diagnosis not present

## 2020-08-19 DIAGNOSIS — N390 Urinary tract infection, site not specified: Secondary | ICD-10-CM | POA: Diagnosis not present

## 2020-08-19 DIAGNOSIS — M25552 Pain in left hip: Secondary | ICD-10-CM | POA: Diagnosis not present

## 2020-08-19 DIAGNOSIS — K5730 Diverticulosis of large intestine without perforation or abscess without bleeding: Secondary | ICD-10-CM | POA: Diagnosis not present

## 2020-08-19 DIAGNOSIS — K8020 Calculus of gallbladder without cholecystitis without obstruction: Secondary | ICD-10-CM | POA: Diagnosis not present

## 2020-08-19 DIAGNOSIS — S72413A Displaced unspecified condyle fracture of lower end of unspecified femur, initial encounter for closed fracture: Secondary | ICD-10-CM

## 2020-08-19 DIAGNOSIS — S72422A Displaced fracture of lateral condyle of left femur, initial encounter for closed fracture: Secondary | ICD-10-CM | POA: Diagnosis present

## 2020-08-19 DIAGNOSIS — G319 Degenerative disease of nervous system, unspecified: Secondary | ICD-10-CM | POA: Diagnosis not present

## 2020-08-19 DIAGNOSIS — S72425A Nondisplaced fracture of lateral condyle of left femur, initial encounter for closed fracture: Secondary | ICD-10-CM | POA: Diagnosis not present

## 2020-08-19 HISTORY — DX: Nonrheumatic aortic (valve) stenosis: I35.0

## 2020-08-19 LAB — BASIC METABOLIC PANEL
Anion gap: 9 (ref 5–15)
BUN: 13 mg/dL (ref 8–23)
CO2: 24 mmol/L (ref 22–32)
Calcium: 9.8 mg/dL (ref 8.9–10.3)
Chloride: 103 mmol/L (ref 98–111)
Creatinine, Ser: 0.96 mg/dL (ref 0.44–1.00)
GFR, Estimated: 58 mL/min — ABNORMAL LOW (ref >60)
Glucose, Bld: 130 mg/dL — ABNORMAL HIGH (ref 70–99)
Potassium: 2.6 mmol/L — CL (ref 3.5–5.1)
Sodium: 136 mmol/L (ref 135–145)

## 2020-08-19 LAB — URINALYSIS, ROUTINE W REFLEX MICROSCOPIC
Bilirubin Urine: NEGATIVE
Glucose, UA: NEGATIVE mg/dL
Hgb urine dipstick: NEGATIVE
Ketones, ur: NEGATIVE mg/dL
Leukocytes,Ua: NEGATIVE
Nitrite: NEGATIVE
Protein, ur: NEGATIVE mg/dL
Specific Gravity, Urine: 1.009 (ref 1.005–1.030)
pH: 5 (ref 5.0–8.0)

## 2020-08-19 LAB — CBC WITH DIFFERENTIAL/PLATELET
Abs Immature Granulocytes: 0.07 10*3/uL (ref 0.00–0.07)
Basophils Absolute: 0 10*3/uL (ref 0.0–0.1)
Basophils Relative: 0 %
Eosinophils Absolute: 0.1 10*3/uL (ref 0.0–0.5)
Eosinophils Relative: 1 %
HCT: 34.9 % — ABNORMAL LOW (ref 36.0–46.0)
Hemoglobin: 12.2 g/dL (ref 12.0–15.0)
Immature Granulocytes: 0 %
Lymphocytes Relative: 3 %
Lymphs Abs: 0.5 10*3/uL — ABNORMAL LOW (ref 0.7–4.0)
MCH: 31.6 pg (ref 26.0–34.0)
MCHC: 35 g/dL (ref 30.0–36.0)
MCV: 90.4 fL (ref 80.0–100.0)
Monocytes Absolute: 0.8 10*3/uL (ref 0.1–1.0)
Monocytes Relative: 5 %
Neutro Abs: 14.5 10*3/uL — ABNORMAL HIGH (ref 1.7–7.7)
Neutrophils Relative %: 91 %
Platelets: 205 10*3/uL (ref 150–400)
RBC: 3.86 MIL/uL — ABNORMAL LOW (ref 3.87–5.11)
RDW: 13.2 % (ref 11.5–15.5)
WBC: 16.1 10*3/uL — ABNORMAL HIGH (ref 4.0–10.5)
nRBC: 0 % (ref 0.0–0.2)

## 2020-08-19 LAB — CK: Total CK: 155 U/L (ref 38–234)

## 2020-08-19 LAB — MAGNESIUM: Magnesium: 2 mg/dL (ref 1.7–2.4)

## 2020-08-19 MED ORDER — HYDROMORPHONE HCL 1 MG/ML IJ SOLN
0.5000 mg | INTRAMUSCULAR | Status: DC | PRN
Start: 2020-08-19 — End: 2020-08-23
  Administered 2020-08-20 (×3): 0.5 mg via INTRAVENOUS
  Filled 2020-08-19 (×3): qty 0.5

## 2020-08-19 MED ORDER — ESCITALOPRAM OXALATE 10 MG PO TABS
10.0000 mg | ORAL_TABLET | Freq: Every day | ORAL | Status: DC
  Administered 2020-08-20 – 2020-09-02 (×14): 10 mg via ORAL
  Filled 2020-08-19 (×15): qty 1

## 2020-08-19 MED ORDER — ENOXAPARIN SODIUM 40 MG/0.4ML IJ SOSY
40.0000 mg | PREFILLED_SYRINGE | INTRAMUSCULAR | Status: DC
  Administered 2020-08-20 – 2020-08-24 (×5): 40 mg via SUBCUTANEOUS
  Filled 2020-08-19 (×4): qty 0.4

## 2020-08-19 MED ORDER — ONDANSETRON HCL 4 MG/2ML IJ SOLN
4.0000 mg | Freq: Once | INTRAMUSCULAR | Status: AC
  Administered 2020-08-19: 4 mg via INTRAVENOUS
  Filled 2020-08-19: qty 2

## 2020-08-19 MED ORDER — METOPROLOL SUCCINATE ER 50 MG PO TB24
50.0000 mg | ORAL_TABLET | Freq: Every day | ORAL | Status: DC
  Administered 2020-08-20 – 2020-09-02 (×15): 50 mg via ORAL
  Filled 2020-08-19 (×16): qty 1

## 2020-08-19 MED ORDER — AMLODIPINE BESYLATE 5 MG PO TABS
5.0000 mg | ORAL_TABLET | Freq: Every day | ORAL | Status: DC
  Administered 2020-08-20 – 2020-09-02 (×14): 5 mg via ORAL
  Filled 2020-08-19 (×16): qty 1

## 2020-08-19 MED ORDER — POTASSIUM CHLORIDE 10 MEQ/100ML IV SOLN
10.0000 meq | INTRAVENOUS | Status: AC
  Administered 2020-08-20 (×6): 10 meq via INTRAVENOUS
  Filled 2020-08-19 (×6): qty 100

## 2020-08-19 MED ORDER — POTASSIUM CHLORIDE 10 MEQ/100ML IV SOLN
10.0000 meq | INTRAVENOUS | Status: AC
  Administered 2020-08-19 (×2): 10 meq via INTRAVENOUS
  Filled 2020-08-19 (×2): qty 100

## 2020-08-19 MED ORDER — FENTANYL CITRATE (PF) 100 MCG/2ML IJ SOLN
50.0000 ug | Freq: Once | INTRAMUSCULAR | Status: AC
  Administered 2020-08-19: 50 ug via INTRAVENOUS
  Filled 2020-08-19: qty 2

## 2020-08-19 MED ORDER — POTASSIUM CHLORIDE CRYS ER 20 MEQ PO TBCR
40.0000 meq | EXTENDED_RELEASE_TABLET | Freq: Once | ORAL | Status: AC
  Administered 2020-08-19: 40 meq via ORAL
  Filled 2020-08-19: qty 2

## 2020-08-19 MED ORDER — NITROGLYCERIN 0.4 MG SL SUBL
0.4000 mg | SUBLINGUAL_TABLET | SUBLINGUAL | Status: DC | PRN
  Administered 2020-08-25 (×2): 0.4 mg via SUBLINGUAL
  Filled 2020-08-19: qty 1

## 2020-08-19 MED ORDER — SENNOSIDES-DOCUSATE SODIUM 8.6-50 MG PO TABS: 1.0000 | ORAL_TABLET | Freq: Every evening | ORAL | Status: DC | PRN

## 2020-08-19 MED ORDER — LACTATED RINGERS IV SOLN: INTRAVENOUS | Status: DC

## 2020-08-19 MED ORDER — SODIUM CHLORIDE 0.9 % IV BOLUS
500.0000 mL | Freq: Once | INTRAVENOUS | Status: AC
  Administered 2020-08-19: 500 mL via INTRAVENOUS

## 2020-08-19 MED ORDER — TRIAMTERENE-HCTZ 37.5-25 MG PO TABS
1.0000 | ORAL_TABLET | Freq: Every day | ORAL | Status: DC
  Administered 2020-08-20 – 2020-08-28 (×9): 1 via ORAL
  Filled 2020-08-19 (×10): qty 1

## 2020-08-19 MED ORDER — FENTANYL CITRATE (PF) 100 MCG/2ML IJ SOLN
50.0000 ug | Freq: Once | INTRAMUSCULAR | Status: AC
Start: 2020-08-19 — End: 2020-08-20
  Administered 2020-08-20: 50 ug via INTRAVENOUS
  Filled 2020-08-19: qty 2

## 2020-08-19 NOTE — ED Notes (Signed)
Called lab to request add-on for magnesium level

## 2020-08-19 NOTE — ED Notes (Signed)
Received report of critical potassium from lab. Notified MD of K+ 2.6

## 2020-08-19 NOTE — ED Provider Notes (Addendum)
[facility] DEPT Provider Note   CSN: [id number] Arrival date & time: 08/19/20  1601     History Chief Complaint  Patient presents with   Fall   Knee Injury    Terri King is a 86 y.o. female.  Presents to ER with concern for fall, syncope.  Patient states that she passed out today and woke up at base of steps.  Unsure if she fell down the steps.  Unsure how she passed out.  Does not recall being lightheaded or dizzy.  No chest pain or difficulty in breathing.  From the fall she is having some pain to the left knee, left hip and right shoulder.  Unsure if she hit her head.  No neck pain or back pain.  Denies new numbness or weakness.  Fall happened last night, laid on the floor until this morning.  HPI     Past Medical History:  Diagnosis Date   Aortic stenosis    Stage B Moderate AS (Echocardiogram 03/2019: EF 65-70, no RWMA, mild LVH, Gr 1 DD, RVSP 37.6 (mild elevation), mild MR, mod AI, mod AS (mean 28 mmHg/peak 52.3 mmHg/LVOT AV VTI ratio: 0.37)   CAD (coronary artery disease)    DR Harrington Challenger; by notes normal cath in 1986, normal stress 2013   Family history of anesthesia complication    NIENCE had sensitivity   History of pneumonia 2008   HTN (hypertension)    dr Alain Marion   Hyperlipemia    MI (myocardial infarction) ([place])    DR Harrington Challenger   Osteoarthritis    Shortness of breath     Patient Active Problem List   Diagnosis Date Noted   Syncope 08/19/2020   Fracture of femoral condyle, closed ([place]) 08/19/2020   Leukocytosis 08/19/2020   Aortic stenosis    Statin myopathy 04/08/2019   Well adult exam 08/07/2018   Hand weakness 08/07/2018   Gait disorder 08/07/2018   Headache 08/13/2017   Rash and nonspecific skin eruption 08/13/2017   Boil of buttock 05/09/2017   Shoulder pain, left 08/14/2016   Aortic stenosis, moderate 08/14/2016   Osteoporosis 11/01/2015   Cough 09/02/2015   Heart valve disease 06/14/2015   Insomnia 10/22/2013   UTI  (urinary tract infection) 08/24/2013   Preop exam for internal medicine 01/16/2012   Ankle pain 07/11/2011   Adjustment disorder with mixed anxiety and depressed mood 03/28/2011   GERD (gastroesophageal reflux disease) 12/25/2010   Dysphagia 12/25/2010   Coronary atherosclerosis 02/09/2010   FEVER UNSPECIFIED 01/18/2010   CHEST PAIN 01/18/2010   Urinary incontinence 01/18/2010   BRONCHITIS NOT SPECIFIED AS ACUTE OR CHRONIC 12/05/2009   NEURALGIA, TRIGEMINAL 07/04/2009   EAR PAIN 07/04/2009   HYPOKALEMIA 02/17/2009   Arthralgia 02/17/2009   Hyperglycemia 09/02/2008   Osteoarthritis 03/11/2008   EDEMA 03/11/2008   KNEE PAIN 02/04/2008   CHEST XRAY, ABNORMAL 07/28/2007   Hyperlipidemia 03/17/2007   Essential hypertension 03/17/2007   UPPER RESPIRATORY INFECTION (URI) 03/17/2007   VAGINITIS 03/17/2007    Past Surgical History:  Procedure Laterality Date   JOINT REPLACEMENT  12/13   L TKR    KNEE ARTHROSCOPY     left   PARTIAL HYSTERECTOMY     TONSILLECTOMY     TOTAL KNEE ARTHROPLASTY  01/23/2012   Procedure: TOTAL KNEE ARTHROPLASTY;  Surgeon: Ninetta Lights, MD;  Location: [facility];  Service: Orthopedics;  Laterality: Left;   TOTAL KNEE ARTHROPLASTY  12/'05/2011   left knee     OB  History   No obstetric history on file.     Family History  Problem Relation Age of Onset   Glaucoma Father    Hypertension Mother    Hypertension Other     Social History   Tobacco Use   Smoking status: Never   Smokeless tobacco: Never  Vaping Use   Vaping Use: Never used  Substance Use Topics   Alcohol use: No   Drug use: No    Home Medications Prior to Admission medications   Medication Sig Start Date End Date Taking? Authorizing Provider  Alcohol Swabs (B-D SINGLE USE SWABS REGULAR) PADS 1 each by Does not apply route as directed. Dx: R73.9 Patient not taking: Reported on 05/10/2020 02/23/20   [name], Evie Lacks, MD  amLODipine (NORVASC) 5 MG tablet TAKE 1 TABLET EVERY DAY  04/10/20   [name], Evie Lacks, MD  Ascorbic Acid (VITAMIN C PO) Take 1 tablet by mouth daily.    [provider]  Blood Glucose Calibration (TRUE METRIX LEVEL 1) Low SOLN 1 each by In Vitro route as directed. Dx: R73.9 Patient not taking: Reported on 05/10/2020 02/23/20   [name], Evie Lacks, MD  Blood Glucose Monitoring Suppl (TRUE METRIX METER) w/Device KIT USE AS DIRECTED 04/28/20   [name], Evie Lacks, MD  cholecalciferol (VITAMIN D) 400 UNITS TABS Take 400 Units by mouth daily.    [provider]  escitalopram (LEXAPRO) 10 MG tablet Take 1 tablet (10 mg total) by mouth daily. 06/01/20   [name], Evie Lacks, MD  famotidine (PEPCID) 40 MG tablet TAKE 1 TABLET (40 MG TOTAL) BY MOUTH DAILY. ANNUAL APPT DUE IN JUNE MUST SEE PROVIDER FOR FUTURE REFILLS 04/10/20   [name], Evie Lacks, MD  gabapentin (NEURONTIN) 100 MG capsule TAKE 1 CAPSULE THREE TIMES DAILY 06/30/20   [name], Evie Lacks, MD  glucose blood (TRUE METRIX BLOOD GLUCOSE TEST) test strip Use to check blood sugars twice a day 02/23/20   [name], Evie Lacks, MD  ibuprofen (IBU) 600 MG tablet TAKE 1 TABLET EVERY DAY AS NEEDED FOR MODERATE PAIN 05/26/20   [name], Evie Lacks, MD  Krill Oil (MAXIMUM RED KRILL) 300 MG CAPS 1 po qd 07/22/13   [name], Evie Lacks, MD  metoprolol succinate (TOPROL-XL) 50 MG 24 hr tablet TAKE 1 TABLET (50 MG TOTAL) BY MOUTH AT BEDTIME / WITH OR IMMEDIATELY FOLLOWING A MEAL AS DIRECTED 04/10/20   [name], Evie Lacks, MD  Multiple Vitamins-Minerals (MULTIPLE VITAMINS/WOMENS PO) Take by mouth daily.     [provider]  nitroGLYCERIN (NITROSTAT) 0.4 MG SL tablet Place 1 tablet (0.4 mg total) under the tongue every 5 (five) minutes as needed for chest pain. 03/20/19   Richardson Dopp T, PA-C  potassium chloride SA (KLOR-CON) 20 MEQ tablet  02/29/20   [provider]  REPATHA SURECLICK 673 MG/ML SOAJ INJECT 1 PEN INTO THE SKIN EVERY 14 DAYS 03/15/20   Fay Records, MD  temazepam (RESTORIL) 30  MG capsule TAKE 1 CAPSULE AT BEDTIME AS NEEDED  FOR  SLEEP 05/26/20   [name], Evie Lacks, MD  triamterene-hydrochlorothiazide (MAXZIDE-25) 37.5-25 MG tablet TAKE 1 TABLET EVERY DAY 04/10/20   [name], Evie Lacks, MD  TRUEplus Lancets 33G MISC Use to check blood sugars twice a day 02/23/20   [name], Evie Lacks, MD  valACYclovir (VALTREX) 1000 MG tablet Take 1 tablet (1,000 mg total) by mouth 3 (three) times daily. 08/13/17   [name], Evie Lacks, MD  vitamin B-12 (CYANOCOBALAMIN) 100 MCG tablet Take 100 mcg by  mouth daily.    [provider]  Vitamin E 400 units TABS Take by mouth daily.     [provider]    Allergies    Codeine, Crestor [rosuvastatin], Dexilant [dexlansoprazole], Ibuprofen, Simvastatin, Tylenol [acetaminophen], and Iodine  Review of Systems   Review of Systems  Constitutional:  Negative for chills and fever.  HENT:  Negative for ear pain and sore throat.   Eyes:  Negative for pain and visual disturbance.  Respiratory:  Negative for cough and shortness of breath.   Cardiovascular:  Negative for chest pain and palpitations.  Gastrointestinal:  Negative for abdominal pain and vomiting.  Genitourinary:  Negative for dysuria and hematuria.  Musculoskeletal:  Positive for arthralgias. Negative for back pain.  Skin:  Negative for color change and rash.  Neurological:  Positive for syncope. Negative for seizures.  All other systems reviewed and are negative.  Physical Exam Updated Vital Signs BP 99/83   Pulse 85   Temp 98.9 F (37.2 C) (Oral)   Resp 16   SpO2 97%   Physical Exam Vitals and nursing note reviewed.  Constitutional:      General: She is not in acute distress.    Appearance: She is well-developed.  HENT:     Head: Normocephalic and atraumatic.  Eyes:     Conjunctiva/sclera: Conjunctivae normal.  Cardiovascular:     Rate and Rhythm: Normal rate and regular rhythm.     Heart sounds: No murmur heard. Pulmonary:     Effort: Pulmonary  effort is normal. No respiratory distress.     Breath sounds: Normal breath sounds.  Abdominal:     Palpations: Abdomen is soft.     Tenderness: There is no abdominal tenderness.  Musculoskeletal:     Cervical back: Neck supple.     Comments: Back: no C, T, L spine TTP, no step off or deformity RUE: Some tenderness to right shoulder, normal joint ROM, radial pulse intact, distal sensation and motor intact LUE: no TTP throughout, no deformity, normal joint ROM, radial pulse intact, distal sensation and motor intact RLE:  no TTP throughout, no deformity, normal joint ROM, distal pulse, sensation and motor intact LLE: TTP over knee and hip, no obvious deformity, distal pulse, sensation and motor intact  Skin:    General: Skin is warm and dry.  Neurological:     General: No focal deficit present.     Mental Status: She is alert.  Psychiatric:        Mood and Affect: Mood normal.    ED Results / Procedures / Treatments   Labs (all labs ordered are listed, but only abnormal results are displayed) Labs Reviewed  CBC WITH DIFFERENTIAL/PLATELET - Abnormal; Notable for the following components:      Result Value   WBC 16.1 (*)    RBC 3.86 (*)    HCT 34.9 (*)    Neutro Abs 14.5 (*)    Lymphs Abs 0.5 (*)    All other components within normal limits  BASIC METABOLIC PANEL - Abnormal; Notable for the following components:   Potassium 2.6 (*)    Glucose, Bld 130 (*)    GFR, Estimated 58 (*)    All other components within normal limits  URINALYSIS, ROUTINE W REFLEX MICROSCOPIC - Abnormal; Notable for the following components:   Color, Urine STRAW (*)    All other components within normal limits  CK  MAGNESIUM  CBC  CREATININE, SERUM  CBC  BASIC METABOLIC PANEL  EKG None  Radiology DG Chest 1 View  Result Date: 08/19/2020 CLINICAL DATA:  Syncope, fall yesterday. Right shoulder pain. Left hip pain. Knee pain. EXAM: CHEST  1 VIEW COMPARISON:  Radiograph 09/02/2015. Shoulder  radiograph reported separately. FINDINGS: Stable heart size and mediastinal contours. Aortic atherosclerosis. Mitral annulus calcifications. No pneumothorax or focal airspace disease. No pleural effusion. Remote left rib fractures. No acute rib fractures are seen. IMPRESSION: 1. No acute finding. 2. Remote left rib fractures. Electronically Signed   By: Keith Rake M.D.   On: 08/19/2020 18:51   DG Shoulder Right  Result Date: 08/19/2020 CLINICAL DATA:  Syncope, fall yesterday.  Right shoulder pain. EXAM: RIGHT SHOULDER - 2+ VIEW COMPARISON:  None. FINDINGS: No evidence of acute fracture or dislocation. There is advanced glenohumeral osteoarthritis with near complete joint space loss, osteophytes which are likely fragmented, and subchondral cystic change and sclerosis. 15 mm ossified intra-articular body. Soft tissue calcifications adjacent to the lateral humeral head may represent ossified intra-articular bodies or enthesopathic. Subcortical cystic change in the lateral humeral head suggestive of rotator cuff arthropathy. There is mild to moderate acromioclavicular degenerative change. IMPRESSION: 1. No acute fracture or dislocation of the right shoulder. 2. Advanced glenohumeral osteoarthritis with at least one 15 mm ossified intra-articular body. Additional rounded calcifications adjacent to the lateral humeral head may be additional ossified bodies or within the rotator cuff tendons. 3. Subcortical cystic change in the lateral humeral head suggestive of rotator cuff arthropathy. Electronically Signed   By: Keith Rake M.D.   On: 08/19/2020 18:52   CT Head Wo Contrast  Result Date: 08/19/2020 CLINICAL DATA:  Head trauma, mod-severe Fall yesterday. EXAM: CT HEAD WITHOUT CONTRAST TECHNIQUE: Contiguous axial images were obtained from the base of the skull through the vertex without intravenous contrast. COMPARISON:  Head CT 05/13/2015 FINDINGS: Brain: No intracranial hemorrhage, mass effect, or  midline shift. Slight progression in generalized atrophy and chronic small vessel ischemia from 2017. No hydrocephalus. The basilar cisterns are patent. No evidence of territorial infarct or acute ischemia. No extra-axial or intracranial fluid collection. Vascular: Atherosclerosis of skullbase vasculature without hyperdense vessel or abnormal calcification. Skull: No fracture or focal lesion. Sinuses/Orbits: Paranasal sinuses and mastoid air cells are clear. The visualized orbits are unremarkable. Postsurgical change in both globes. Other: None. IMPRESSION: 1. No acute intracranial abnormality. No skull fracture. 2. Atrophy and chronic small vessel ischemia, with slight progression since 2017. Electronically Signed   By: Keith Rake M.D.   On: 08/19/2020 18:47   CT Cervical Spine Wo Contrast  Result Date: 08/19/2020 CLINICAL DATA:  Neck trauma (Age >= 65y) neck paina fter neck trauma Fall yesterday EXAM: CT CERVICAL SPINE WITHOUT CONTRAST TECHNIQUE: Multidetector CT imaging of the cervical spine was performed without intravenous contrast. Multiplanar CT image reconstructions were also generated. COMPARISON:  Cervical radiographs 3107 FINDINGS: Alignment: No traumatic subluxation. Trace anterolisthesis of C3 on C4 appears facet mediated. Skull base and vertebrae: No acute fracture. Vertebral body heights are maintained. The dens and skull base are intact. Soft tissues and spinal canal: No prevertebral fluid or swelling. No visible canal hematoma. Disc levels: Disc space narrowing and endplate spurring at P9-J0. There is multilevel facet hypertrophy, most prominently affecting C3-C4 on the right and C5-C6 on the left. Upper chest: No acute or unexpected findings. Other: Carotid calcifications. IMPRESSION: Degenerative change in the cervical spine without acute fracture or subluxation. Electronically Signed   By: Keith Rake M.D.   On: 08/19/2020 18:49   DG  Knee Complete 4 Views Left  Result Date:  08/19/2020 CLINICAL DATA:  Syncope, fall, left knee pain. EXAM: LEFT KNEE - COMPLETE 4+ VIEW COMPARISON:  Radiograph 01/23/2012 FINDINGS: Left knee arthroplasty in place. Acute fracture adjacent to the lateral femoral condyle which is likely periprosthetic. Intact tibial component. There has been patellar resurfacing. Moderate knee joint effusion. The bones are diffusely under mineralized. IMPRESSION: 1. Left knee arthroplasty in place. Acute fracture of the lateral femoral condyle, likely periprosthetic. 2. Moderate knee joint effusion. Electronically Signed   By: Keith Rake M.D.   On: 08/19/2020 18:55   DG Hip Unilat W or Wo Pelvis 2-3 Views Left  Result Date: 08/19/2020 CLINICAL DATA:  Syncope, fall.  Left hip pain. EXAM: DG HIP (WITH OR WITHOUT PELVIS) 2-3V LEFT COMPARISON:  None. FINDINGS: No acute fracture or dislocation. Advanced left hip osteoarthritis with near complete joint space loss, subchondral cystic change and sclerosis, and peripheral osteophytes. Pubic rami are intact. The bones are diffusely under mineralized. There is moderate right hip osteoarthritis. Pubic symphysis and sacroiliac joints are congruent. IMPRESSION: 1. No acute fracture or dislocation of the left hip. 2. Advanced left hip osteoarthritis. Electronically Signed   By: Keith Rake M.D.   On: 08/19/2020 18:53    Procedures Procedures   Medications Ordered in ED Medications  fentaNYL (SUBLIMAZE) injection 50 mcg (has no administration in time range)  amLODipine (NORVASC) tablet 5 mg (has no administration in time range)  metoprolol succinate (TOPROL-XL) 24 hr tablet 50 mg (has no administration in time range)  triamterene-hydrochlorothiazide (MAXZIDE-25) 37.5-25 MG per tablet 1 tablet (has no administration in time range)  nitroGLYCERIN (NITROSTAT) SL tablet 0.4 mg (has no administration in time range)  escitalopram (LEXAPRO) tablet 10 mg (has no administration in time range)  potassium chloride 10 mEq in 100  mL IVPB (has no administration in time range)  enoxaparin (LOVENOX) injection 40 mg (has no administration in time range)  lactated ringers infusion (has no administration in time range)  HYDROmorphone (DILAUDID) injection 0.5 mg (has no administration in time range)  senna-docusate (Senokot-S) tablet 1 tablet (has no administration in time range)  fentaNYL (SUBLIMAZE) injection 50 mcg (50 mcg Intravenous Given 08/19/20 1715)  ondansetron (ZOFRAN) injection 4 mg (4 mg Intravenous Given 08/19/20 1715)  potassium chloride 10 mEq in 100 mL IVPB (0 mEq Intravenous Stopped 08/19/20 2150)  sodium chloride 0.9 % bolus 500 mL (0 mLs Intravenous Stopped 08/19/20 2133)  potassium chloride SA (KLOR-CON) CR tablet 40 mEq (40 mEq Oral Given 08/19/20 1943)  fentaNYL (SUBLIMAZE) injection 50 mcg (50 mcg Intravenous Given 08/19/20 1943)    ED Course  I have reviewed the triage vital signs and the nursing notes.  Pertinent labs & imaging results that were available during my care of the patient were reviewed by me and considered in my medical decision making (see chart for details).  Clinical Course as of 08/20/20 0019  Fri Aug 19, 2020  2249 [phone] [RD]  Wink [RD]    Clinical Course User Index [RD] Lucrezia Starch, MD   MDM Rules/Calculators/A&P                          86 year old lady presented to ER with syncope and fall.  On exam noted to have tenderness over the left knee, left hip as well as right shoulder.  For trauma assessment, work-up concerning for periprosthetic femoral condyle fracture.  Discussed case with Dr. Ronnie Derby on-call  for [employer] tonight (patient reports Noemi Chapel performed knee replacement surgery few years ago) -he recommends knee immobilizer, toe-touch weightbearing for now, formal consult note to follow.  Regarding syncope, EKG without acute abnormality, normal intervals, no events on telemetry monitoring.  Basic labs were checked and notable for significant hypokalemia  and leukocytosis.  CK was normal.  Patient does not have any specific infectious symptoms, chest x-ray negative.  UA negative.  Given the electrolyte derangement, syncope, knee fracture, believe patient would benefit from admission for further management.  Discussed case with Dr. Kipp Brood who will admit.  Final Clinical Impression(s) / ED Diagnoses Final diagnoses:  Closed nondisplaced fracture of condyle of left femur, initial encounter ([place])  Syncope, unspecified syncope type  Hypokalemia  Leukocytosis, unspecified type    Rx / DC Orders ED Discharge Orders     None        Lucrezia Starch, MD 08/20/20 0019    Lucrezia Starch, MD 08/20/20 [phone]

## 2020-08-19 NOTE — ED Triage Notes (Signed)
BIBA Per EMS: Pt coming from home with c/o a fall that occurred yesterday. R knee pain since then. Swelling to R knee; possible deformity. Denies blood thinners; denies hitting head. All vitals WDL 8/10 pain

## 2020-08-19 NOTE — ED Notes (Signed)
Knee immobilizer placed on left knee,

## 2020-08-19 NOTE — H&P (Addendum)
History and Physical    Terri King TIW:[id number] DOB: Jun 24, 1934 DOA: 08/19/2020  PCP: Cassandria Anger, MD   Patient coming from: Home  Chief Complaint: syncope  HPI: Terri King is a 86 y.o. female with medical history significant for HTN, Aortic stenosis-moderate,CAD, hx of MI, CAD, OA who presents after being found on the floor at home by family today. She reports she passed out last night and laid on the floor overnight. She does not remember what made her pass out and states she does not remember being dizzy or having chest pain or palpitations prior to her passing out.  She has had no chest pain, shortness of breath, cough, fever, nausea, vomiting, diarrhea since she woke up on the floor.  She states she was at the base of her steps and does not remember she fell down the steps.  She states she has not had any numbness or weakness of her extremities and has had no visual change or slurred speech.  She is generally in good health and lives independently alone.  She was unable to stand up or ambulate on her left leg.  States she has never had an episode like this in the past.  She reports she has never had seizures. She denies tobacco, alcohol, illicit drug use.  ED Course: In the emergency room she has been hemodynamically stable but has an elevated blood pressure.  She had CT scan of her head and neck which were negative.  She had x-rays of her shoulder her left knee her hips and chest x-ray.  She has a lateral condyle fracture of the left femur otherwise no acute injuries.  Lab work revealed a low potassium level of 2.6.  Sodium is 136, chloride 103, bicarb 24, creatinine 0.96, BUN 13, glucose 130, magnesium 2.0.  CPK 155.  WBC 16,100, hemoglobin 12.2, hematocrit 34.9, platelets 205,000.  Hospitalist service has been asked to admit for further management  Review of Systems:  General: Denies fever, chills, weight loss, night sweats.  Denies dizziness.  Denies change in  appetite HENT: Denies head trauma, headache, denies change in hearing, tinnitus.  Denies nasal congestion.  Denies sore throat.  Denies difficulty swallowing Eyes: Denies blurry vision, pain in eye, drainage.  Denies discoloration of eyes. Neck: Denies pain.  Denies swelling.  Denies pain with movement. Cardiovascular: Denies chest pain, palpitations.  Denies edema.  Denies orthopnea Respiratory: Denies shortness of breath, cough.  Denies wheezing.  Denies sputum production Gastrointestinal: Denies abdominal pain, swelling.  Denies nausea, vomiting, diarrhea.  Denies melena.  Denies hematemesis. Musculoskeletal: Reports pain in left knee and cannot ambulate. Has swelling of left knee. Genitourinary: Denies pelvic pain.  Denies urinary frequency or hesitancy.  Denies dysuria.  Skin: Denies rash.  Denies petechiae, purpura, ecchymosis. Neurological: Denies seizure activity.  Denies paresthesia.  Denies slurred speech, drooping face. Denies visual change. Psychiatric: Denies depression, anxiety. Denies hallucinations.  Past Medical History:  Diagnosis Date   Aortic stenosis    Stage B Moderate AS (Echocardiogram 03/2019: EF 65-70, no RWMA, mild LVH, Gr 1 DD, RVSP 37.6 (mild elevation), mild MR, mod AI, mod AS (mean 28 mmHg/peak 52.3 mmHg/LVOT AV VTI ratio: 0.37)   CAD (coronary artery disease)    DR Harrington Challenger; by notes normal cath in 1986, normal stress 2013   Family history of anesthesia complication    NIENCE had sensitivity   History of pneumonia 2008   HTN (hypertension)    dr Alain Marion   Hyperlipemia  MI (myocardial infarction) ([place])    DR Harrington Challenger   Osteoarthritis    Shortness of breath     Past Surgical History:  Procedure Laterality Date   JOINT REPLACEMENT  12/13   L TKR    KNEE ARTHROSCOPY     left   PARTIAL HYSTERECTOMY     TONSILLECTOMY     TOTAL KNEE ARTHROPLASTY  01/23/2012   Procedure: TOTAL KNEE ARTHROPLASTY;  Surgeon: Ninetta Lights, MD;  Location: [facility];  Service:  Orthopedics;  Laterality: Left;   TOTAL KNEE ARTHROPLASTY  12/'05/2011   left knee    Social History  reports that she has never smoked. She has never used smokeless tobacco. She reports that she does not drink alcohol and does not use drugs.  Allergies  Allergen Reactions   Codeine Other (See Comments)    Abnormal behavior   Crestor [Rosuvastatin]     Myalgias    Dexilant [Dexlansoprazole]     Made me sick   Ibuprofen Nausea And Vomiting   Simvastatin     REACTION: achy   Tylenol [Acetaminophen]    Iodine Swelling and Rash    Family History  Problem Relation Age of Onset   Glaucoma Father    Hypertension Mother    Hypertension Other      Prior to Admission medications   Medication Sig Start Date End Date Taking? Authorizing Provider  Alcohol Swabs (B-D SINGLE USE SWABS REGULAR) PADS 1 each by Does not apply route as directed. Dx: R73.9 Patient not taking: Reported on 05/10/2020 02/23/20   [name], Evie Lacks, MD  amLODipine (NORVASC) 5 MG tablet TAKE 1 TABLET EVERY DAY 04/10/20   [name], Evie Lacks, MD  Ascorbic Acid (VITAMIN C PO) Take 1 tablet by mouth daily.    [provider]  Blood Glucose Calibration (TRUE METRIX LEVEL 1) Low SOLN 1 each by In Vitro route as directed. Dx: R73.9 Patient not taking: Reported on 05/10/2020 02/23/20   [name], Evie Lacks, MD  Blood Glucose Monitoring Suppl (TRUE METRIX METER) w/Device KIT USE AS DIRECTED 04/28/20   [name], Evie Lacks, MD  cholecalciferol (VITAMIN D) 400 UNITS TABS Take 400 Units by mouth daily.    [provider]  escitalopram (LEXAPRO) 10 MG tablet Take 1 tablet (10 mg total) by mouth daily. 06/01/20   [name], Evie Lacks, MD  famotidine (PEPCID) 40 MG tablet TAKE 1 TABLET (40 MG TOTAL) BY MOUTH DAILY. ANNUAL APPT DUE IN JUNE MUST SEE PROVIDER FOR FUTURE REFILLS 04/10/20   [name], Evie Lacks, MD  feeding supplement, ENSURE COMPLETE, (ENSURE COMPLETE) LIQD Take 237 mLs by mouth daily. 09/17/18    [name], Evie Lacks, MD  gabapentin (NEURONTIN) 100 MG capsule TAKE 1 CAPSULE THREE TIMES DAILY 06/30/20   [name], Evie Lacks, MD  glucose blood (TRUE METRIX BLOOD GLUCOSE TEST) test strip Use to check blood sugars twice a day 02/23/20   [name], Evie Lacks, MD  ibuprofen (IBU) 600 MG tablet TAKE 1 TABLET EVERY DAY AS NEEDED FOR MODERATE PAIN 05/26/20   [name], Evie Lacks, MD  Krill Oil (MAXIMUM RED KRILL) 300 MG CAPS 1 po qd 07/22/13   [name], Evie Lacks, MD  metoprolol succinate (TOPROL-XL) 50 MG 24 hr tablet TAKE 1 TABLET (50 MG TOTAL) BY MOUTH AT BEDTIME / WITH OR IMMEDIATELY FOLLOWING A MEAL AS DIRECTED 04/10/20   [name], Evie Lacks, MD  Multiple Vitamins-Minerals (MULTIPLE VITAMINS/WOMENS PO) Take by mouth daily.     [provider]  nitroGLYCERIN (NITROSTAT) 0.4  MG SL tablet Place 1 tablet (0.4 mg total) under the tongue every 5 (five) minutes as needed for chest pain. 03/20/19   Richardson Dopp T, PA-C  potassium chloride (KLOR-CON) 8 MEQ tablet Take 1 tablet (8 mEq total) by mouth 2 (two) times daily. 10/20/19 10/19/20  [name], Evie Lacks, MD  potassium chloride SA (KLOR-CON) 20 MEQ tablet  02/29/20   [provider]  REPATHA SURECLICK 433 MG/ML SOAJ INJECT 1 PEN INTO THE SKIN EVERY 14 DAYS 03/15/20   Fay Records, MD  temazepam (RESTORIL) 30 MG capsule TAKE 1 CAPSULE AT BEDTIME AS NEEDED  FOR  SLEEP 05/26/20   [name], Evie Lacks, MD  triamterene-hydrochlorothiazide (MAXZIDE-25) 37.5-25 MG tablet TAKE 1 TABLET EVERY DAY 04/10/20   [name], Evie Lacks, MD  TRUEplus Lancets 33G MISC Use to check blood sugars twice a day 02/23/20   [name], Evie Lacks, MD  valACYclovir (VALTREX) 1000 MG tablet Take 1 tablet (1,000 mg total) by mouth 3 (three) times daily. 08/13/17   [name], Evie Lacks, MD  vitamin B-12 (CYANOCOBALAMIN) 100 MCG tablet Take 100 mcg by mouth daily.    [provider]  Vitamin E 400 units TABS Take by mouth daily.     [provider]    Physical  Exam: Vitals:   08/19/20 2100 08/19/20 2130 08/19/20 2230 08/19/20 2330  BP: (!) 173/56 (!) 176/62 (!) 159/63 99/83  Pulse: 87 91 89 85  Resp: (!) 26 (!) '22 19 16  ' Temp:      TempSrc:      SpO2: 97% 99% 99% 97%    Constitutional: NAD, calm, comfortable Vitals:   08/19/20 2100 08/19/20 2130 08/19/20 2230 08/19/20 2330  BP: (!) 173/56 (!) 176/62 (!) 159/63 99/83  Pulse: 87 91 89 85  Resp: (!) 26 (!) '22 19 16  ' Temp:      TempSrc:      SpO2: 97% 99% 99% 97%   General: WDWN, pleasant, Alert and oriented x3.  Eyes: EOMI, PERRL, conjunctivae normal.  Sclera nonicteric HENT:  [place]/AT, external ears normal.  Nares patent without epistasis.  Mucous membranes are moist. Neck: Soft, normal range of motion, supple, no masses, no thyromegaly. Trachea midline Respiratory: clear to auscultation bilaterally, no wheezing, no crackles. Normal respiratory effort. No accessory muscle use.  Cardiovascular: Regular rate and rhythm, 4/6 systolic murmur. No rubs / gallops. No extremity edema. 2+ pedal pulses.  Abdomen: Soft, no tenderness, nondistended, no rebound or guarding.  No masses palpated. Bowel sounds normoactive Musculoskeletal: Limited range of motion of left knee due to pain. no cyanosis. Left knee effusion. Normal muscle tone.  Skin: Warm, dry, intact no rashes, lesions, ulcers. No induration. Small abrasions to left anterior knee. Neurologic: CN 2-12 grossly intact.  Normal speech.  Sensation intact, Strength symmetric  Psychiatric: Normal judgment and insight.  Normal mood.    Labs on Admission: I have personally reviewed following labs and imaging studies  CBC: Recent Labs  Lab 08/19/20 1700  WBC 16.1*  NEUTROABS 14.5*  HGB 12.2  HCT 34.9*  MCV 90.4  PLT 295    Basic Metabolic Panel: Recent Labs  Lab 08/19/20 1700 08/19/20 1841  NA 136  --   K 2.6*  --   CL 103  --   CO2 24  --   GLUCOSE 130*  --   BUN 13  --   CREATININE 0.96  --   CALCIUM 9.8  --   MG  --  2.0  GFR: CrCl cannot be calculated (Unknown ideal weight.).  Liver Function Tests: No results for input(s): AST, ALT, ALKPHOS, BILITOT, PROT, ALBUMIN in the last 168 hours.  Urine analysis:    Component Value Date/Time   COLORURINE STRAW (A) 08/19/2020 2133   APPEARANCEUR CLEAR 08/19/2020 2133   LABSPEC 1.009 08/19/2020 2133   PHURINE 5.0 08/19/2020 2133   GLUCOSEU NEGATIVE 08/19/2020 2133   GLUCOSEU NEGATIVE 05/10/2020 1051   HGBUR NEGATIVE 08/19/2020 2133   BILIRUBINUR NEGATIVE 08/19/2020 2133   KETONESUR NEGATIVE 08/19/2020 2133   PROTEINUR NEGATIVE 08/19/2020 2133   UROBILINOGEN 0.2 05/10/2020 1051   NITRITE NEGATIVE 08/19/2020 2133   LEUKOCYTESUR NEGATIVE 08/19/2020 2133    Radiological Exams on Admission: DG Chest 1 View  Result Date: 08/19/2020 CLINICAL DATA:  Syncope, fall yesterday. Right shoulder pain. Left hip pain. Knee pain. EXAM: CHEST  1 VIEW COMPARISON:  Radiograph 09/02/2015. Shoulder radiograph reported separately. FINDINGS: Stable heart size and mediastinal contours. Aortic atherosclerosis. Mitral annulus calcifications. No pneumothorax or focal airspace disease. No pleural effusion. Remote left rib fractures. No acute rib fractures are seen. IMPRESSION: 1. No acute finding. 2. Remote left rib fractures. Electronically Signed   By: Keith Rake M.D.   On: 08/19/2020 18:51   DG Shoulder Right  Result Date: 08/19/2020 CLINICAL DATA:  Syncope, fall yesterday.  Right shoulder pain. EXAM: RIGHT SHOULDER - 2+ VIEW COMPARISON:  None. FINDINGS: No evidence of acute fracture or dislocation. There is advanced glenohumeral osteoarthritis with near complete joint space loss, osteophytes which are likely fragmented, and subchondral cystic change and sclerosis. 15 mm ossified intra-articular body. Soft tissue calcifications adjacent to the lateral humeral head may represent ossified intra-articular bodies or enthesopathic. Subcortical cystic change in the lateral humeral head  suggestive of rotator cuff arthropathy. There is mild to moderate acromioclavicular degenerative change. IMPRESSION: 1. No acute fracture or dislocation of the right shoulder. 2. Advanced glenohumeral osteoarthritis with at least one 15 mm ossified intra-articular body. Additional rounded calcifications adjacent to the lateral humeral head may be additional ossified bodies or within the rotator cuff tendons. 3. Subcortical cystic change in the lateral humeral head suggestive of rotator cuff arthropathy. Electronically Signed   By: Keith Rake M.D.   On: 08/19/2020 18:52   CT Head Wo Contrast  Result Date: 08/19/2020 CLINICAL DATA:  Head trauma, mod-severe Fall yesterday. EXAM: CT HEAD WITHOUT CONTRAST TECHNIQUE: Contiguous axial images were obtained from the base of the skull through the vertex without intravenous contrast. COMPARISON:  Head CT 05/13/2015 FINDINGS: Brain: No intracranial hemorrhage, mass effect, or midline shift. Slight progression in generalized atrophy and chronic small vessel ischemia from 2017. No hydrocephalus. The basilar cisterns are patent. No evidence of territorial infarct or acute ischemia. No extra-axial or intracranial fluid collection. Vascular: Atherosclerosis of skullbase vasculature without hyperdense vessel or abnormal calcification. Skull: No fracture or focal lesion. Sinuses/Orbits: Paranasal sinuses and mastoid air cells are clear. The visualized orbits are unremarkable. Postsurgical change in both globes. Other: None. IMPRESSION: 1. No acute intracranial abnormality. No skull fracture. 2. Atrophy and chronic small vessel ischemia, with slight progression since 2017. Electronically Signed   By: Keith Rake M.D.   On: 08/19/2020 18:47   CT Cervical Spine Wo Contrast  Result Date: 08/19/2020 CLINICAL DATA:  Neck trauma (Age >= 65y) neck paina fter neck trauma Fall yesterday EXAM: CT CERVICAL SPINE WITHOUT CONTRAST TECHNIQUE: Multidetector CT imaging of the cervical  spine was performed without intravenous contrast. Multiplanar CT image reconstructions were also generated.  COMPARISON:  Cervical radiographs 3107 FINDINGS: Alignment: No traumatic subluxation. Trace anterolisthesis of C3 on C4 appears facet mediated. Skull base and vertebrae: No acute fracture. Vertebral body heights are maintained. The dens and skull base are intact. Soft tissues and spinal canal: No prevertebral fluid or swelling. No visible canal hematoma. Disc levels: Disc space narrowing and endplate spurring at L9-J6. There is multilevel facet hypertrophy, most prominently affecting C3-C4 on the right and C5-C6 on the left. Upper chest: No acute or unexpected findings. Other: Carotid calcifications. IMPRESSION: Degenerative change in the cervical spine without acute fracture or subluxation. Electronically Signed   By: Keith Rake M.D.   On: 08/19/2020 18:49   DG Knee Complete 4 Views Left  Result Date: 08/19/2020 CLINICAL DATA:  Syncope, fall, left knee pain. EXAM: LEFT KNEE - COMPLETE 4+ VIEW COMPARISON:  Radiograph 01/23/2012 FINDINGS: Left knee arthroplasty in place. Acute fracture adjacent to the lateral femoral condyle which is likely periprosthetic. Intact tibial component. There has been patellar resurfacing. Moderate knee joint effusion. The bones are diffusely under mineralized. IMPRESSION: 1. Left knee arthroplasty in place. Acute fracture of the lateral femoral condyle, likely periprosthetic. 2. Moderate knee joint effusion. Electronically Signed   By: Keith Rake M.D.   On: 08/19/2020 18:55   DG Hip Unilat W or Wo Pelvis 2-3 Views Left  Result Date: 08/19/2020 CLINICAL DATA:  Syncope, fall.  Left hip pain. EXAM: DG HIP (WITH OR WITHOUT PELVIS) 2-3V LEFT COMPARISON:  None. FINDINGS: No acute fracture or dislocation. Advanced left hip osteoarthritis with near complete joint space loss, subchondral cystic change and sclerosis, and peripheral osteophytes. Pubic rami are intact. The  bones are diffusely under mineralized. There is moderate right hip osteoarthritis. Pubic symphysis and sacroiliac joints are congruent. IMPRESSION: 1. No acute fracture or dislocation of the left hip. 2. Advanced left hip osteoarthritis. Electronically Signed   By: Keith Rake M.D.   On: 08/19/2020 18:53    EKG: Is pending  Assessment/Plan Principal Problem:   Syncope Ms. Prather is admitted to telemetry floor.  IVF hydration with LR at 75 ml/hr overnight.  No arrhythmia on monitor noted.  EKG ordered.  Pt has hx of AS. Obtain Echo in am  Active Problems:   HYPOKALEMIA Potassium to be repleted with IV supplementation overnight. Magnesium level is normal.  Check electrolytes and renal function in am    Essential hypertension Continue home antihypertensive medications. Monitor BP    Aortic stenosis Obtain echo in am to reevaluate.     Fracture of femoral condyle, closed  Placed in Knee immobilizer overnight. Orthopedic surgery to evaluate pt in am    Leukocytosis Mild elevation in WBC with no sign of infection. Recheck CBC in am    DVT prophylaxis: Lovenox for DVT prophylaxis.   Code Status:   Full Code  Family Communication:  Diagnosis and plan discussed with patient and her son who is at the bedside.  They both verbalized understanding and agree with plan.  Further recommendations to follow as clinical indicated Disposition Plan:   Patient is from:  Home  Anticipated DC to:  Home vs. Rehab, to be determined during hospitalization  Anticipated DC date:  Anticipate 2 midnight or more stay in the hospital  Anticipated DC barriers: If rehab bed is required this may be a barrier to expedient discharge  Consults called:  Orthopedic surgery consulted by ER physician and will see pt in am  Admission status:  Inpatient   Eben Burow MD [facility]  How to contact the [facility] Attending or Consulting provider [place] or covering provider during after hours [place],  for this patient?   Check the care team in [facility] and look for a) attending/consulting [facility] provider listed and b) the [facility] team listed Log into [email or web address] and use [place] universal password to access. If you do not have the password, please contact the hospital operator. Locate the [facility] provider you are looking for under [facility] and page to a number that you can be directly reached. If you still have difficulty reaching the provider, please page the [facility] (Director on Call) for the Hospitalists listed on amion for assistance.  08/19/2020, 11:48 PM

## 2020-08-19 NOTE — ED Notes (Signed)
PT IN CT SCAN ?

## 2020-08-20 ENCOUNTER — Inpatient Hospital Stay (HOSPITAL_COMMUNITY): Payer: Medicare HMO

## 2020-08-20 DIAGNOSIS — E876 Hypokalemia: Secondary | ICD-10-CM

## 2020-08-20 DIAGNOSIS — S72411D Displaced unspecified condyle fracture of lower end of right femur, subsequent encounter for closed fracture with routine healing: Secondary | ICD-10-CM | POA: Diagnosis not present

## 2020-08-20 DIAGNOSIS — I10 Essential (primary) hypertension: Secondary | ICD-10-CM | POA: Diagnosis not present

## 2020-08-20 DIAGNOSIS — I350 Nonrheumatic aortic (valve) stenosis: Secondary | ICD-10-CM

## 2020-08-20 DIAGNOSIS — D72823 Leukemoid reaction: Secondary | ICD-10-CM

## 2020-08-20 LAB — BASIC METABOLIC PANEL
Anion gap: 7 (ref 5–15)
Anion gap: 7 (ref 5–15)
BUN: 9 mg/dL (ref 8–23)
BUN: 8 mg/dL (ref 8–23)
CO2: 24 mmol/L (ref 22–32)
CO2: 22 mmol/L (ref 22–32)
Calcium: 8.8 mg/dL — ABNORMAL LOW (ref 8.9–10.3)
Calcium: 9 mg/dL (ref 8.9–10.3)
Chloride: 106 mmol/L (ref 98–111)
Chloride: 106 mmol/L (ref 98–111)
Creatinine, Ser: 0.85 mg/dL (ref 0.44–1.00)
Creatinine, Ser: 0.83 mg/dL (ref 0.44–1.00)
GFR, Estimated: >60 mL/min (ref >60)
GFR, Estimated: >60 mL/min (ref >60)
Glucose, Bld: 152 mg/dL — ABNORMAL HIGH (ref 70–99)
Glucose, Bld: 136 mg/dL — ABNORMAL HIGH (ref 70–99)
Potassium: 2.7 mmol/L — CL (ref 3.5–5.1)
Potassium: 3.4 mmol/L — ABNORMAL LOW (ref 3.5–5.1)
Sodium: 137 mmol/L (ref 135–145)
Sodium: 135 mmol/L (ref 135–145)

## 2020-08-20 LAB — ECHOCARDIOGRAM COMPLETE
AR max vel: 0.94 cm2
AV Area VTI: 0.92 cm2
AV Area mean vel: 0.92 cm2
AV Mean grad: 33 mmHg
AV Peak grad: 58.1 mmHg
Ao pk vel: 3.81 m/s
Area-P 1/2: 2.79 cm2
Calc EF: 72.9 %
Height: 63 in
MV VTI: 1.38 cm2
P 1/2 time: 426 msec
S' Lateral: 1.7 cm
Single Plane A2C EF: 70 %
Single Plane A4C EF: 76.6 %
Weight: 2651.2 oz

## 2020-08-20 LAB — CBC
HCT: 30.6 % — ABNORMAL LOW (ref 36.0–46.0)
HCT: 33.3 % — ABNORMAL LOW (ref 36.0–46.0)
Hemoglobin: 10.7 g/dL — ABNORMAL LOW (ref 12.0–15.0)
Hemoglobin: 11.5 g/dL — ABNORMAL LOW (ref 12.0–15.0)
MCH: 32.2 pg (ref 26.0–34.0)
MCH: 31.3 pg (ref 26.0–34.0)
MCHC: 35 g/dL (ref 30.0–36.0)
MCHC: 34.5 g/dL (ref 30.0–36.0)
MCV: 92.2 fL (ref 80.0–100.0)
MCV: 90.5 fL (ref 80.0–100.0)
Platelets: 165 10*3/uL (ref 150–400)
Platelets: 180 10*3/uL (ref 150–400)
RBC: 3.32 MIL/uL — ABNORMAL LOW (ref 3.87–5.11)
RBC: 3.68 MIL/uL — ABNORMAL LOW (ref 3.87–5.11)
RDW: 13.2 % (ref 11.5–15.5)
RDW: 13.2 % (ref 11.5–15.5)
WBC: 16.5 10*3/uL — ABNORMAL HIGH (ref 4.0–10.5)
WBC: 17.8 10*3/uL — ABNORMAL HIGH (ref 4.0–10.5)
nRBC: 0 % (ref 0.0–0.2)
nRBC: 0 % (ref 0.0–0.2)

## 2020-08-20 LAB — CREATININE, SERUM
Creatinine, Ser: 0.7 mg/dL (ref 0.44–1.00)
GFR, Estimated: >60 mL/min (ref >60)

## 2020-08-20 LAB — SARS CORONAVIRUS 2 (TAT 6-24 HRS): SARS Coronavirus 2: NEGATIVE

## 2020-08-20 MED ORDER — POTASSIUM CHLORIDE CRYS ER 20 MEQ PO TBCR
40.0000 meq | EXTENDED_RELEASE_TABLET | Freq: Once | ORAL | Status: AC
  Administered 2020-08-20: 40 meq via ORAL
  Filled 2020-08-20: qty 2

## 2020-08-20 NOTE — ED Notes (Signed)
Verified potassium order by Trey Paula MD

## 2020-08-20 NOTE — ED Notes (Signed)
Patient given meal tray.

## 2020-08-20 NOTE — Progress Notes (Signed)
  Echocardiogram 2D Echocardiogram has been performed.  [name] 08/20/2020, 1:49 PM

## 2020-08-20 NOTE — Consult Note (Signed)
NAME: Terri King MRN:   [id number] DOB:   Jul 02, 1934   CHIEF COMPLAINT:  left knee pain  HISTORY:  Terri King is a 86 y.o. female with medical history significant for HTN, Aortic stenosis-moderate,CAD, hx of MI, CAD, OA who presents after being found on the floor at home by family today. She reports she passed out last night and laid on the floor overnight. She does not remember what made her pass out and states she does not remember being dizzy or having chest pain or palpitations prior to her passing out.  She has had no chest pain, shortness of breath, cough, fever, nausea, vomiting, diarrhea since she woke up on the floor.  She states she was at the base of her steps and does not remember she fell down the steps.  She states she has not had any numbness or weakness of her extremities and has had no visual change or slurred speech.  She is generally in good health and lives independently alone.  She was unable to stand up or ambulate on her left leg.  States she has never had an episode like this in the past.  She reports she has never had seizures. She denies tobacco, alcohol, illicit drug use.  PAST MEDICAL HISTORY:   Past Medical History:  Diagnosis Date   Aortic stenosis    Stage B Moderate AS (Echocardiogram 03/2019: EF 65-70, no RWMA, mild LVH, Gr 1 DD, RVSP 37.6 (mild elevation), mild MR, mod AI, mod AS (mean 28 mmHg/peak 52.3 mmHg/LVOT AV VTI ratio: 0.37)   CAD (coronary artery disease)    DR Harrington Challenger; by notes normal cath in 1986, normal stress 2013   Family history of anesthesia complication    NIENCE had sensitivity   History of pneumonia 2008   HTN (hypertension)    dr Alain Marion   Hyperlipemia    MI (myocardial infarction) ([place])    DR Harrington Challenger   Osteoarthritis    Shortness of breath     PAST SURGICAL HISTORY:   Past Surgical History:  Procedure Laterality Date   JOINT REPLACEMENT  12/13   L TKR    KNEE ARTHROSCOPY     left   PARTIAL HYSTERECTOMY     TONSILLECTOMY      TOTAL KNEE ARTHROPLASTY  01/23/2012   Procedure: TOTAL KNEE ARTHROPLASTY;  Surgeon: Ninetta Lights, MD;  Location: [facility];  Service: Orthopedics;  Laterality: Left;   TOTAL KNEE ARTHROPLASTY  12/'05/2011   left knee    MEDICATIONS:   Medications Prior to Admission  Medication Sig Dispense Refill   Alcohol Swabs (B-D SINGLE USE SWABS REGULAR) PADS 1 each by Does not apply route as directed. Dx: R73.9 180 each 3   amLODipine (NORVASC) 5 MG tablet TAKE 1 TABLET EVERY DAY 90 tablet 3   Ascorbic Acid (VITAMIN C PO) Take 1 tablet by mouth daily.     Blood Glucose Calibration (TRUE METRIX LEVEL 1) Low SOLN 1 each by In Vitro route as directed. Dx: R73.9 3 each 3   Blood Glucose Monitoring Suppl (TRUE METRIX METER) w/Device KIT USE AS DIRECTED 1 kit 0   cholecalciferol (VITAMIN D) 400 UNITS TABS Take 400 Units by mouth daily.     escitalopram (LEXAPRO) 10 MG tablet Take 1 tablet (10 mg total) by mouth daily. 90 tablet 3   gabapentin (NEURONTIN) 100 MG capsule TAKE 1 CAPSULE THREE TIMES DAILY 270 capsule 3   glucose blood (TRUE METRIX BLOOD GLUCOSE TEST) test strip  Use to check blood sugars twice a day 180 each 3   ibuprofen (IBU) 600 MG tablet TAKE 1 TABLET EVERY DAY AS NEEDED FOR MODERATE PAIN 90 tablet 0   Krill Oil (MAXIMUM RED KRILL) 300 MG CAPS 1 po qd 100 capsule 3   metoprolol succinate (TOPROL-XL) 50 MG 24 hr tablet TAKE 1 TABLET (50 MG TOTAL) BY MOUTH AT BEDTIME / WITH OR IMMEDIATELY FOLLOWING A MEAL AS DIRECTED 90 tablet 3   Multiple Vitamins-Minerals (MULTIPLE VITAMINS/WOMENS PO) Take by mouth daily.      nitroGLYCERIN (NITROSTAT) 0.4 MG SL tablet Place 1 tablet (0.4 mg total) under the tongue every 5 (five) minutes as needed for chest pain. 25 tablet 6   potassium chloride SA (KLOR-CON) 20 MEQ tablet Take 20 mEq by mouth daily.     REPATHA SURECLICK 161 MG/ML SOAJ INJECT 1 PEN INTO THE SKIN EVERY 14 DAYS 6 mL 3   temazepam (RESTORIL) 30 MG capsule TAKE 1 CAPSULE AT BEDTIME AS NEEDED   FOR  SLEEP 90 capsule 1   triamterene-hydrochlorothiazide (MAXZIDE-25) 37.5-25 MG tablet TAKE 1 TABLET EVERY DAY 90 tablet 3   TRUEplus Lancets 33G MISC Use to check blood sugars twice a day 180 each 3   valACYclovir (VALTREX) 1000 MG tablet Take 1 tablet (1,000 mg total) by mouth 3 (three) times daily. (Patient taking differently: Take 1,000 mg by mouth 3 (three) times daily as needed.) 21 tablet 0   vitamin B-12 (CYANOCOBALAMIN) 100 MCG tablet Take 100 mcg by mouth daily.     Vitamin E 400 units TABS Take by mouth daily.      famotidine (PEPCID) 40 MG tablet TAKE 1 TABLET (40 MG TOTAL) BY MOUTH DAILY. ANNUAL APPT DUE IN JUNE MUST SEE PROVIDER FOR FUTURE REFILLS (Patient not taking: Reported on 08/20/2020) 90 tablet 3    ALLERGIES:   Allergies  Allergen Reactions   Codeine Other (See Comments)    Abnormal behavior   Crestor [Rosuvastatin]     Myalgias    Dexilant [Dexlansoprazole]     Made me sick   Ibuprofen Nausea And Vomiting   Simvastatin     REACTION: achy   Tylenol [Acetaminophen]    Iodine Swelling and Rash    REVIEW OF SYSTEMS:   Negative except left knee and leg pain  FAMILY HISTORY:   Family History  Problem Relation Age of Onset   Glaucoma Father    Hypertension Mother    Hypertension Other     SOCIAL HISTORY:   reports that she has never smoked. She has never used smokeless tobacco. She reports that she does not drink alcohol and does not use drugs.   PHYSICAL EXAM:    Constitutional:  The patient is awake, alert, and oriented x 3. No acute distress. Abdomen:  Abdomen is soft, non-tender, non-distended No hernias, masses, or organomegaly Normoactive bowel sounds. Musculoskeletal:  No cyanosis, clubbing, or edema Left knee pain, swelling, tenderness, decreased strength and rom Skin:  No rashes, lesions, ulcers palpation of skin: no induration or nodules Neurologic:  CN 2-12 intact Sensation all 4 extremities intact Psychiatric:  Mental status Mood,  affect appropriate Orientation to person, place, time judgment and insight appear intact    LABORATORY STUDIES: Recent Labs    08/20/20 0047 08/20/20 0400  WBC 16.5* 17.8*  HGB 10.7* 11.5*  HCT 30.6* 33.3*  PLT 165 180     Recent Labs    08/20/20 0047 08/20/20 0400  NA 137 135  K 2.7*  3.4*  CL 106 106  CO2 24 22  GLUCOSE 152* 136*  BUN 9 8  CREATININE 0.85 0.83  0.70  CALCIUM 8.8* 9.0    STUDIES/RESULTS:  DG Chest 1 View  Result Date: 08/19/2020 CLINICAL DATA:  Syncope, fall yesterday. Right shoulder pain. Left hip pain. Knee pain. EXAM: CHEST  1 VIEW COMPARISON:  Radiograph 09/02/2015. Shoulder radiograph reported separately. FINDINGS: Stable heart size and mediastinal contours. Aortic atherosclerosis. Mitral annulus calcifications. No pneumothorax or focal airspace disease. No pleural effusion. Remote left rib fractures. No acute rib fractures are seen. IMPRESSION: 1. No acute finding. 2. Remote left rib fractures. Electronically Signed   By: Keith Rake M.D.   On: 08/19/2020 18:51   DG Shoulder Right  Result Date: 08/19/2020 CLINICAL DATA:  Syncope, fall yesterday.  Right shoulder pain. EXAM: RIGHT SHOULDER - 2+ VIEW COMPARISON:  None. FINDINGS: No evidence of acute fracture or dislocation. There is advanced glenohumeral osteoarthritis with near complete joint space loss, osteophytes which are likely fragmented, and subchondral cystic change and sclerosis. 15 mm ossified intra-articular body. Soft tissue calcifications adjacent to the lateral humeral head may represent ossified intra-articular bodies or enthesopathic. Subcortical cystic change in the lateral humeral head suggestive of rotator cuff arthropathy. There is mild to moderate acromioclavicular degenerative change. IMPRESSION: 1. No acute fracture or dislocation of the right shoulder. 2. Advanced glenohumeral osteoarthritis with at least one 15 mm ossified intra-articular body. Additional rounded calcifications  adjacent to the lateral humeral head may be additional ossified bodies or within the rotator cuff tendons. 3. Subcortical cystic change in the lateral humeral head suggestive of rotator cuff arthropathy. Electronically Signed   By: Keith Rake M.D.   On: 08/19/2020 18:52   CT Head Wo Contrast  Result Date: 08/19/2020 CLINICAL DATA:  Head trauma, mod-severe Fall yesterday. EXAM: CT HEAD WITHOUT CONTRAST TECHNIQUE: Contiguous axial images were obtained from the base of the skull through the vertex without intravenous contrast. COMPARISON:  Head CT 05/13/2015 FINDINGS: Brain: No intracranial hemorrhage, mass effect, or midline shift. Slight progression in generalized atrophy and chronic small vessel ischemia from 2017. No hydrocephalus. The basilar cisterns are patent. No evidence of territorial infarct or acute ischemia. No extra-axial or intracranial fluid collection. Vascular: Atherosclerosis of skullbase vasculature without hyperdense vessel or abnormal calcification. Skull: No fracture or focal lesion. Sinuses/Orbits: Paranasal sinuses and mastoid air cells are clear. The visualized orbits are unremarkable. Postsurgical change in both globes. Other: None. IMPRESSION: 1. No acute intracranial abnormality. No skull fracture. 2. Atrophy and chronic small vessel ischemia, with slight progression since 2017. Electronically Signed   By: Keith Rake M.D.   On: 08/19/2020 18:47   CT Cervical Spine Wo Contrast  Result Date: 08/19/2020 CLINICAL DATA:  Neck trauma (Age >= 65y) neck paina fter neck trauma Fall yesterday EXAM: CT CERVICAL SPINE WITHOUT CONTRAST TECHNIQUE: Multidetector CT imaging of the cervical spine was performed without intravenous contrast. Multiplanar CT image reconstructions were also generated. COMPARISON:  Cervical radiographs 3107 FINDINGS: Alignment: No traumatic subluxation. Trace anterolisthesis of C3 on C4 appears facet mediated. Skull base and vertebrae: No acute fracture.  Vertebral body heights are maintained. The dens and skull base are intact. Soft tissues and spinal canal: No prevertebral fluid or swelling. No visible canal hematoma. Disc levels: Disc space narrowing and endplate spurring at T3-S2. There is multilevel facet hypertrophy, most prominently affecting C3-C4 on the right and C5-C6 on the left. Upper chest: No acute or unexpected findings. Other: Carotid calcifications. IMPRESSION:  Degenerative change in the cervical spine without acute fracture or subluxation. Electronically Signed   By: Keith Rake M.D.   On: 08/19/2020 18:49   DG Knee Complete 4 Views Left  Result Date: 08/19/2020 CLINICAL DATA:  Syncope, fall, left knee pain. EXAM: LEFT KNEE - COMPLETE 4+ VIEW COMPARISON:  Radiograph 01/23/2012 FINDINGS: Left knee arthroplasty in place. Acute fracture adjacent to the lateral femoral condyle which is likely periprosthetic. Intact tibial component. There has been patellar resurfacing. Moderate knee joint effusion. The bones are diffusely under mineralized. IMPRESSION: 1. Left knee arthroplasty in place. Acute fracture of the lateral femoral condyle, likely periprosthetic. 2. Moderate knee joint effusion. Electronically Signed   By: Keith Rake M.D.   On: 08/19/2020 18:55   ECHOCARDIOGRAM COMPLETE  Result Date: 08/20/2020    ECHOCARDIOGRAM REPORT   Patient Name:   Terri King Date of Exam: 08/20/2020 Medical Rec #:  [id number]       Height:       63.0 in Accession #:    [id number]      Weight:       165.7 lb Date of Birth:  09/20/1934       BSA:          1.785 m Patient Age:    89 years        BP:           248/59 mmHg Patient Gender: F               HR:           79 bpm. Exam Location:  Inpatient Procedure: 2D Echo, 3D Echo, Cardiac Doppler, Color Doppler and Strain Analysis Indications:    I35.0 Nonrheumatic aortic (valve) stenosis  History:        Patient has prior history of Echocardiogram examinations, most                 recent 04/08/2019. Aortic  Valve Disease, Signs/Symptoms:Syncope,                 Fever and Chest Pain; Risk Factors:Hypertension and                 Dyslipidemia. Edema. Aortic stenosis.  Sonographer:    Roseanna Rainbow RDCS Referring Phys: [id number] Eben Burow  Sonographer Comments: Global longitudinal strain was attempted. Patient supine. Could not turn due to broken leg. IMPRESSIONS  1. The aortic valve is calcified. There is severe calcifcation of the aortic valve. There is severe thickening of the aortic valve. Aortic valve regurgitation is mild. Severe aortic valve stenosis. AVA 0.9cm2, mean gradient 2mHg, peak gradient 636mg,  Vmax 4.44m20m 2. Left ventricular ejection fraction, by estimation, is 65 to 70%. Left ventricular ejection fraction by 3D volume is 71 %. The left ventricle has normal function. The left ventricle has no regional wall motion abnormalities. There is moderate asymmetric hypertrophy of the basal-septum. The rest of the LV segments demonstrate mild left ventricular hypertrophy. Left ventricular diastolic parameters are consistent with Grade I diastolic dysfunction (impaired relaxation). Elevated left atrial pressure. The average left ventricular global longitudinal strain is -20.2 %. The global longitudinal strain is normal.  3. Right ventricular systolic function is normal. The right ventricular size is normal. There is severely elevated pulmonary artery systolic pressure. The estimated right ventricular systolic pressure is 86.32.9Hg.  4. The mitral valve is abnormal. There is moderate thickening of the mitral valve leaflet(s). There is moderate calcification of the mitral valve  leaflet(s). Moderate to severe mitral annular calcification. Trivial mitral valve regurgitation. Mild calcific mitral stenosis with MVA 1.5cm2 by continuity, mean gradient 45mHg at HR 74bpm.  5. The inferior vena cava is dilated in size with >50% respiratory variability, suggesting right atrial pressure of 8 mmHg. Comparison(s):  Compared to prior TTE in 03/2019, the aortic stenosis is now severe (previously moderate) and there is severe pulmonary hypertension with PASP 623mg. FINDINGS  Left Ventricle: Left ventricular ejection fraction, by estimation, is 65 to 70%. Left ventricular ejection fraction by 3D volume is 71 %. The left ventricle has normal function. The left ventricle has no regional wall motion abnormalities. The average left ventricular global longitudinal strain is -20.2 %. The global longitudinal strain is normal. The left ventricular internal cavity size was normal in size. There is moderate asymmetric hypertrophy of the basal-septum. The rest of the LV segments demonstrate mild left ventricular hypertrophy. Left ventricular diastolic parameters are consistent with Grade I diastolic dysfunction (impaired relaxation). Elevated left atrial pressure. Right Ventricle: The right ventricular size is normal. No increase in right ventricular wall thickness. Right ventricular systolic function is normal. There is severely elevated pulmonary artery systolic pressure. The tricuspid regurgitant velocity is 4.22 m/s, and with an assumed right atrial pressure of 15 mmHg, the estimated right ventricular systolic pressure is 8602.7mHg. Left Atrium: Left atrial size was normal in size. Right Atrium: Right atrial size was normal in size. Pericardium: There is no evidence of pericardial effusion. Mitral Valve: The mitral valve is abnormal. There is moderate thickening of the mitral valve leaflet(s). There is moderate calcification of the mitral valve leaflet(s). Moderate to severe mitral annular calcification. Trivial mitral valve regurgitation. MV peak gradient, 14.8 mmHg. The mean mitral valve gradient is 4.0 mmHg. Mild calcific mitral stenosis with MVA 1.5cm2 by continuity, mean gradient 24m324m at HR 74bpm. Tricuspid Valve: The tricuspid valve is normal in structure. Tricuspid valve regurgitation is mild. Aortic Valve: There is severe  calcifcation of the aortic valve. There is severe thickening of the aortic valve. Aortic valve regurgitation is mild. Aortic regurgitation PHT measures 426 msec. Severe aortic stenosis is present. AVA 0.9cm2, mean gradient 56m59m peak gradient 67mm330mVmax 4.30m/s.20mlmonic Valve: The pulmonic valve was not well visualized. Pulmonic valve regurgitation is trivial. Aorta: The aortic root and ascending aorta are structurally normal, with no evidence of dilitation. Venous: The inferior vena cava is dilated in size with greater than 50% respiratory variability, suggesting right atrial pressure of 8 mmHg. IAS/Shunts: The interatrial septum is aneurysmal. No atrial level shunt detected by color flow Doppler.  LEFT VENTRICLE PLAX 2D LVIDd:         2.90 cm         Diastology LVIDs:         1.70 cm         LV e' medial:    5.22 cm/s LV PW:         1.50 cm         LV E/e' medial:  22.5 LV IVS:        1.40 cm         LV e' lateral:   7.51 cm/s LVOT diam:     1.80 cm         LV E/e' lateral: 15.6 LV SV:         68 LV SV Index:   38              2D LVOT Area:  2.54 cm        Longitudinal                                Strain                                2D Strain GLS  -20.2 % LV Volumes (MOD)               Avg: LV vol d, MOD    57.6 ml A2C:                           3D Volume EF LV vol d, MOD    48.3 ml       LV 3D EF:    Left A4C:                                        ventricular LV vol s, MOD    17.3 ml                    ejection A2C:                                        fraction by LV vol s, MOD    11.3 ml                    3D volume A4C:                                        is 71 %. LV SV MOD A2C:   40.3 ml LV SV MOD A4C:   48.3 ml LV SV MOD BP:    39.0 ml       3D Volume EF:                                3D EF:        71 % RIGHT VENTRICLE             IVC RV S prime:     11.90 cm/s  IVC diam: 2.00 cm TAPSE (M-mode): 2.2 cm LEFT ATRIUM             Index       RIGHT ATRIUM           Index LA diam:        3.70 cm  2.07 cm/m  RA Area:     14.40 cm LA Vol (A2C):   40.8 ml 22.86 ml/m RA Volume:   35.60 ml  19.94 ml/m LA Vol (A4C):   32.2 ml 18.04 ml/m LA Biplane Vol: 37.8 ml 21.18 ml/m  AORTIC VALVE AV Area (Vmax):    0.94 cm AV Area (Vmean):   0.92 cm AV Area (VTI):     0.92 cm AV Vmax:           381.00 cm/s AV Vmean:          265.333 cm/s AV VTI:  0.743 m AV Peak Grad:      58.1 mmHg AV Mean Grad:      33.0 mmHg LVOT Vmax:         140.00 cm/s LVOT Vmean:        95.900 cm/s LVOT VTI:          0.269 m LVOT/AV VTI ratio: 0.36 AI PHT:            426 msec  AORTA Ao Root diam: 2.80 cm Ao Asc diam:  3.10 cm MITRAL VALVE                TRICUSPID VALVE MV Area (PHT): 2.79 cm     TR Peak grad:   71.2 mmHg MV Area VTI:   1.38 cm     TR Vmax:        422.00 cm/s MV Peak grad:  14.8 mmHg MV Mean grad:  4.0 mmHg     SHUNTS MV Vmax:       1.92 m/s     Systemic VTI:  0.27 m MV Vmean:      90.3 cm/s    Systemic Diam: 1.80 cm MV Decel Time: 272 msec MV E velocity: 117.50 cm/s MV A velocity: 175.00 cm/s MV E/A ratio:  0.67 Gwyndolyn Kaufman MD Electronically signed by Gwyndolyn Kaufman MD Signature Date/Time: 08/20/2020/2:56:15 PM    Final    DG Hip Unilat W or Wo Pelvis 2-3 Views Left  Result Date: 08/19/2020 CLINICAL DATA:  Syncope, fall.  Left hip pain. EXAM: DG HIP (WITH OR WITHOUT PELVIS) 2-3V LEFT COMPARISON:  None. FINDINGS: No acute fracture or dislocation. Advanced left hip osteoarthritis with near complete joint space loss, subchondral cystic change and sclerosis, and peripheral osteophytes. Pubic rami are intact. The bones are diffusely under mineralized. There is moderate right hip osteoarthritis. Pubic symphysis and sacroiliac joints are congruent. IMPRESSION: 1. No acute fracture or dislocation of the left hip. 2. Advanced left hip osteoarthritis. Electronically Signed   By: Keith Rake M.D.   On: 08/19/2020 18:53    ASSESSMENT: left lateral femoral condyle fracture, TKA intact.   PLAN: knee  immobilizer, TDWB, Follow up with [facility] in 2 weeks.     Ayriana Wix,STEPHEN D 08/20/2020. 8:16 PM

## 2020-08-20 NOTE — Progress Notes (Signed)
PROGRESS NOTE  Terri King KZL:[id number] DOB: 15-Jun-1934 DOA: 08/19/2020 PCP: Tresa Garter, MD  Brief History   Terri King is a 86 y.o. female with medical history significant for HTN, Aortic stenosis-moderate,CAD, hx of MI, CAD, OA who presents after being found on the floor at home by family today. She reports she passed out last night and laid on the floor overnight. She does not remember what made her pass out and states she does not remember being dizzy or having chest pain or palpitations prior to her passing out.  She has had no chest pain, shortness of breath, cough, fever, nausea, vomiting, diarrhea since she woke up on the floor.  She states she was at the base of her steps and does not remember she fell down the steps.  She states she has not had any numbness or weakness of her extremities and has had no visual change or slurred speech.  She is generally in good health and lives independently alone.  She was unable to stand up or ambulate on her left leg.  States she has never had an episode like this in the past.  She reports she has never had seizures. She denies tobacco, alcohol, illicit drug use.   ED Course: In the emergency room she has been hemodynamically stable but has an elevated blood pressure.  She had CT scan of her head and neck which were negative.  She had x-rays of her shoulder her left knee her hips and chest x-ray.  She has a lateral condyle fracture of the left femur otherwise no acute injuries.  Lab work revealed a low potassium level of 2.6.  Sodium is 136, chloride 103, bicarb 24, creatinine 0.96, BUN 13, glucose 130, magnesium 2.0.  CPK 155.  WBC 16,100, hemoglobin 12.2, hematocrit 34.9, platelets 205,000.  Hospitalist service has been asked to admit for further management  Consultants  Orthopedic surgery. Dr Sherlean Foot contacted by ED last night. Dr. Sherlean Foot has agreed to evaluate the patient. She will remain Weight bearing as tolerated with the patient in a  knee immobilizer.  Procedures  None  Antibiotics   Anti-infectives (From admission, onward)    None      Subjective  The patient is resting quietly. No new complaints.   Objective   Vitals:  Vitals:   08/20/20 1109 08/20/20 1505  BP: (!) 148/59 (!) 150/54  Pulse: 80   Resp: 18 18  Temp: 99.1 F (37.3 C)   SpO2: 99% 97%    Exam:  Constitutional:  The patient is awake, alert, and oriented x 3. No acute distress. Respiratory:  No increased work of breathing. No wheezes, rales, or rhonchi No tactile fremitus Cardiovascular:  Regular rate and rhythm No murmurs, ectopy, or gallups. No lateral PMI. No thrills. Abdomen:  Abdomen is soft, non-tender, non-distended No hernias, masses, or organomegaly Normoactive bowel sounds.  Musculoskeletal:  No cyanosis, clubbing, or edema Skin:  No rashes, lesions, ulcers palpation of skin: no induration or nodules Neurologic:  CN 2-12 intact Sensation all 4 extremities intact Psychiatric:  Mental status Mood, affect appropriate Orientation to person, place, time  judgment and insight appear intact   I have personally reviewed the following:   Today's Data   Vitals:   08/20/20 1109 08/20/20 1505  BP: (!) 148/59 (!) 150/54  Pulse: 80   Resp: 18 18  Temp: 99.1 F (37.3 C)   SpO2: 99% 97%     Lab Data  CBC  Component Value Date/Time   WBC 17.8 (H) 08/20/2020 0400   RBC 3.68 (L) 08/20/2020 0400   HGB 11.5 (L) 08/20/2020 0400   HCT 33.3 (L) 08/20/2020 0400   PLT 180 08/20/2020 0400   MCV 90.5 08/20/2020 0400   MCH 31.3 08/20/2020 0400   MCHC 34.5 08/20/2020 0400   RDW 13.2 08/20/2020 0400   LYMPHSABS 0.5 (L) 08/19/2020 1700   MONOABS 0.8 08/19/2020 1700   EOSABS 0.1 08/19/2020 1700   BASOSABS 0.0 08/19/2020 1700   BMP Latest Ref Rng & Units 08/20/2020 08/20/2020 08/20/2020  Glucose 70 - 99 mg/dL 401(U) - 272(Z)  BUN 8 - 23 mg/dL 8 - 9  Creatinine 3.66 - 1.00 mg/dL 4.40 3.47 4.25  BUN/Creat Ratio 6 - 22  (calc) - - -  Sodium 135 - 145 mmol/L 135 - 137  Potassium 3.5 - 5.1 mmol/L 3.4(L) - 2.7(LL)  Chloride 98 - 111 mmol/L 106 - 106  CO2 22 - 32 mmol/L 22 - 24  Calcium 8.9 - 10.3 mg/dL 9.0 - 8.8(L)   Micro Data  None  Imaging  CT head without contrast: No acute intracranial abnormality. No skull fracture Atrophy and chronic small vessel ischemia with sllight progression since 2017.  Cardiology Data  NSR  Scheduled Meds:  amLODipine  5 mg Oral Daily   enoxaparin (LOVENOX) injection  40 mg Subcutaneous Q24H   escitalopram  10 mg Oral Daily   metoprolol succinate  50 mg Oral Daily   triamterene-hydrochlorothiazide  1 tablet Oral Daily   Continuous Infusions:  lactated ringers 75 mL/hr at 08/20/20 0045    Principal Problem:   Syncope Active Problems:   HYPOKALEMIA   Essential hypertension   Aortic stenosis   Fracture of femoral condyle, closed (HCC)   Leukocytosis   LOS: 1 day   A & P  HYPOKALEMIA Potassium to be repleted with IV supplementation overnight. Magnesium level is normal. Check electrolytes and renal function in am     Essential hypertension Continue home antihypertensive medications. Monitor BP     Aortic stenosis Obtain echo in am to reevaluate.      Fracture of femoral condyle, closed Placed in Knee immobilizer overnight. Orthopedic surgery to evaluate pt in am     Leukocytosis Mild elevation in WBC with no sign of infection. Recheck CBC in am   DVT prophylaxis:      Lovenox for DVT prophylaxis.   Code Status:              Full Code  Family Communication:       Diagnosis and plan discussed with patient and her son who is at the bedside.  They both verbalized understanding and agree with plan.  Further recommendations to follow as clinical indicated Disposition Plan:              Patient is from:                        Home             Anticipated DC to:                   Home vs. Rehab, to be determined during hospitalization              Anticipated DC date:               Anticipate 2 midnight or more stay in the hospital  Anticipated DC barriers:         If rehab bed is required this may be a barrier to expedient discharge  Jacquette Canales, DO [facility] Direct contact: see [email or web address]  7PM-7AM contact night coverage as above 08/20/2020, 5:59 PM  LOS: 1 day

## 2020-08-21 DIAGNOSIS — S72411D Displaced unspecified condyle fracture of lower end of right femur, subsequent encounter for closed fracture with routine healing: Secondary | ICD-10-CM | POA: Diagnosis not present

## 2020-08-21 DIAGNOSIS — E876 Hypokalemia: Secondary | ICD-10-CM | POA: Diagnosis not present

## 2020-08-21 DIAGNOSIS — I350 Nonrheumatic aortic (valve) stenosis: Secondary | ICD-10-CM | POA: Diagnosis not present

## 2020-08-21 DIAGNOSIS — I10 Essential (primary) hypertension: Secondary | ICD-10-CM | POA: Diagnosis not present

## 2020-08-21 MED ORDER — IBUPROFEN 200 MG PO TABS
600.0000 mg | ORAL_TABLET | Freq: Four times a day (QID) | ORAL | Status: DC | PRN
  Administered 2020-08-21: 600 mg via ORAL
  Filled 2020-08-21: qty 3

## 2020-08-21 MED ORDER — VALACYCLOVIR HCL 500 MG PO TABS: 1000.0000 mg | ORAL_TABLET | Freq: Three times a day (TID) | ORAL | Status: DC

## 2020-08-21 MED ORDER — QUETIAPINE FUMARATE 50 MG PO TABS
50.0000 mg | ORAL_TABLET | Freq: Every day | ORAL | Status: DC
  Administered 2020-08-21 – 2020-08-22 (×2): 50 mg via ORAL
  Filled 2020-08-21 (×3): qty 1

## 2020-08-21 MED ORDER — TEMAZEPAM 15 MG PO CAPS
30.0000 mg | ORAL_CAPSULE | Freq: Every day | ORAL | Status: DC
  Administered 2020-08-21 – 2020-08-22 (×2): 30 mg via ORAL
  Filled 2020-08-21 (×2): qty 2

## 2020-08-21 MED ORDER — FAMOTIDINE 20 MG PO TABS
40.0000 mg | ORAL_TABLET | Freq: Every day | ORAL | Status: DC
  Administered 2020-08-21 – 2020-09-02 (×13): 40 mg via ORAL
  Filled 2020-08-21 (×14): qty 2

## 2020-08-21 MED ORDER — GABAPENTIN 300 MG PO CAPS
300.0000 mg | ORAL_CAPSULE | Freq: Three times a day (TID) | ORAL | Status: DC
  Administered 2020-08-21 – 2020-08-23 (×7): 300 mg via ORAL
  Filled 2020-08-21 (×7): qty 1

## 2020-08-21 MED ORDER — POTASSIUM CHLORIDE CRYS ER 20 MEQ PO TBCR
20.0000 meq | EXTENDED_RELEASE_TABLET | Freq: Every day | ORAL | Status: DC
  Administered 2020-08-21: 20 meq via ORAL
  Filled 2020-08-21: qty 1

## 2020-08-21 MED ORDER — QUETIAPINE FUMARATE 25 MG PO TABS
25.0000 mg | ORAL_TABLET | Freq: Every morning | ORAL | Status: DC
  Administered 2020-08-21 – 2020-08-23 (×3): 25 mg via ORAL
  Filled 2020-08-21 (×2): qty 1

## 2020-08-21 MED ORDER — DIPHENHYDRAMINE HCL 50 MG/ML IJ SOLN
25.0000 mg | Freq: Once | INTRAMUSCULAR | Status: AC
  Administered 2020-08-21: 25 mg via INTRAVENOUS
  Filled 2020-08-21: qty 1

## 2020-08-21 NOTE — Progress Notes (Signed)
PROGRESS NOTE  Terri King [id number]:[id number] DOB: [birth year] DOA: 08/19/2020 PCP: Tresa Garter, MD  Brief History   Terri King is a 86 y.o. female with medical history significant for HTN, Aortic stenosis-moderate,CAD, hx of MI, CAD, OA who presents after being found on the floor at home by family today. She reports she passed out last night and laid on the floor overnight. She does not remember what made her pass out and states she does not remember being dizzy or having chest pain or palpitations prior to her passing out.  She has had no chest pain, shortness of breath, cough, fever, nausea, vomiting, diarrhea since she woke up on the floor.  She states she was at the base of her steps and does not remember she fell down the steps.  She states she has not had any numbness or weakness of her extremities and has had no visual change or slurred speech.  She is generally in good health and lives independently alone.  She was unable to stand up or ambulate on her left leg.  States she has never had an episode like this in the past.  She reports she has never had seizures. She denies tobacco, alcohol, illicit drug use.   ED Course: In the emergency room she has been hemodynamically stable but has an elevated blood pressure.  She had CT scan of her head and neck which were negative.  She had x-rays of her shoulder her left knee her hips and chest x-ray.  She has a lateral condyle fracture of the left femur otherwise no acute injuries.  Lab work revealed a low potassium level of 2.6.  Sodium is 136, chloride 103, bicarb 24, creatinine 0.96, BUN 13, glucose 130, magnesium 2.0.  CPK 155.  WBC 16,100, hemoglobin 12.2, hematocrit 34.9, platelets 205,000.  Hospitalist service has been asked to admit for further management.  The patient has been evaluated by Dr. Sherlean Foot. He has recommended a knee immobilizer, TDWB, and to follow up with [facility] in 2 weeks.   On the evening of 08/20/2020  the patient became delirious. This morning she was a little better, but not oriented and confused.  Consultants  Orthopedic surgery. .  Procedures  None  Antibiotics   Anti-infectives (From admission, onward)    Start     Dose/Rate Route Frequency Ordered Stop   08/21/20 1000  valACYclovir (VALTREX) tablet 1,000 mg  Status:  Discontinued        1,000 mg Oral 3 times daily 08/21/20 0756 08/21/20 1105      Subjective  The patient is resting quietly. She does not know why the she is here, although she was able to tell me that she is in the hospital.  Objective   Vitals:  Vitals:   08/21/20 0435 08/21/20 1419  BP: (!) 165/113 (!) 155/63  Pulse: 84 79  Resp: 20 16  Temp: 98.7 F (37.1 C) 98.6 F (37 C)  SpO2: 94% 98%    Exam:  Constitutional:  The patient is awake, alert, and oriented x 1. She is confused. No acute distress. Respiratory:  No increased work of breathing. No wheezes, rales, or rhonchi No tactile fremitus Cardiovascular:  Regular rate and rhythm No murmurs, ectopy, or gallups. No lateral PMI. No thrills. Abdomen:  Abdomen is soft, non-tender, non-distended No hernias, masses, or organomegaly Normoactive bowel sounds.  Musculoskeletal:  No cyanosis, clubbing, or edema Skin:  No rashes, lesions, ulcers palpation of skin: no induration or nodules  Neurologic:  CN 2-12 intact Sensation all 4 extremities intact Psychiatric:  Mental status Mood, affect appropriate Orientation to person, place, time  judgment and insight appear intact   I have personally reviewed the following:   Today's Data   Vitals:   08/21/20 0435 08/21/20 1419  BP: (!) 165/113 (!) 155/63  Pulse: 84 79  Resp: 20 16  Temp: 98.7 F (37.1 C) 98.6 F (37 C)  SpO2: 94% 98%     Lab Data  CBC    Component Value Date/Time   WBC 17.8 (H) 08/20/2020 0400   RBC 3.68 (L) 08/20/2020 0400   HGB 11.5 (L) 08/20/2020 0400   HCT 33.3 (L) 08/20/2020 0400   PLT 180 08/20/2020  0400   MCV 90.5 08/20/2020 0400   MCH 31.3 08/20/2020 0400   MCHC 34.5 08/20/2020 0400   RDW 13.2 08/20/2020 0400   LYMPHSABS 0.5 (L) 08/19/2020 1700   MONOABS 0.8 08/19/2020 1700   EOSABS 0.1 08/19/2020 1700   BASOSABS 0.0 08/19/2020 1700   BMP Latest Ref Rng & Units 08/20/2020 08/20/2020 08/20/2020  Glucose 70 - 99 mg/dL 449(Q) - 759(F)  BUN 8 - 23 mg/dL 8 - 9  Creatinine 6.38 - 1.00 mg/dL 4.66 5.99 3.57  BUN/Creat Ratio 6 - 22 (calc) - - -  Sodium 135 - 145 mmol/L 135 - 137  Potassium 3.5 - 5.1 mmol/L 3.4(L) - 2.7(LL)  Chloride 98 - 111 mmol/L 106 - 106  CO2 22 - 32 mmol/L 22 - 24  Calcium 8.9 - 10.3 mg/dL 9.0 - 8.8(L)   Micro Data  None  Imaging  CT head without contrast: No acute intracranial abnormality. No skull fracture Atrophy and chronic small vessel ischemia with sllight progression since 2017.  Cardiology Data  NSR  Scheduled Meds:  amLODipine  5 mg Oral Daily   enoxaparin (LOVENOX) injection  40 mg Subcutaneous Q24H   escitalopram  10 mg Oral Daily   famotidine  40 mg Oral Daily   gabapentin  300 mg Oral TID   metoprolol succinate  50 mg Oral Daily   potassium chloride SA  20 mEq Oral Daily   QUEtiapine  25 mg Oral q AM   QUEtiapine  50 mg Oral QHS   temazepam  30 mg Oral QHS   triamterene-hydrochlorothiazide  1 tablet Oral Daily   Continuous Infusions:  lactated ringers 75 mL/hr at 08/20/20 2320    Principal Problem:   Syncope Active Problems:   HYPOKALEMIA   Essential hypertension   Aortic stenosis   Fracture of femoral condyle, closed (HCC)   Leukocytosis   LOS: 2 days   A & P  Hypokalemia: Resolved. Potassium to be repleted with IV supplementation overnight. Magnesium level is normal. Check electrolytes and renal function in am.   Essential hypertension: Continue home antihypertensive medications. Monitor BP.    Aortic stenosis: Echocardiogram performed on 08/20/2020 demonstrated an EF of 65-70%. There is mild LVH. LV diastolic dysfuntion  Grade 1. RV function is normal. There is severe aortic valve stenosis and mild aortic regurgitation. Will need to follow up with Cardiology as outpatient.     Fracture of femoral condyle, closed Placed in Knee immobilizer overnight. Orthopedic surgery to evaluate pt in am     Leukocytosis Mild elevation in WBC with no sign of infection. Recheck CBC in am   DVT prophylaxis:      Lovenox for DVT prophylaxis.   Code Status:  Full Code  Family Communication:       Diagnosis and plan discussed with patient and her son who is at the bedside.  They both verbalized understanding and agree with plan.  Further recommendations to follow as clinical indicated Disposition Plan:              Patient is from:                        Home             Anticipated DC to:                   Home vs. Rehab, to be determined during hospitalization             Anticipated DC date:               Anticipate 2 midnight or more stay in the hospital             Anticipated DC barriers:         If rehab bed is required this may be a barrier to expedient discharge  Aleksandr Pellow, DO [facility] Direct contact: see [email or web address]  7PM-7AM contact night coverage as above 08/21/2020, 5:03 PM  LOS: 1 day

## 2020-08-22 ENCOUNTER — Inpatient Hospital Stay (HOSPITAL_COMMUNITY): Payer: Medicare HMO

## 2020-08-22 DIAGNOSIS — R55 Syncope and collapse: Secondary | ICD-10-CM

## 2020-08-22 LAB — BASIC METABOLIC PANEL
Anion gap: 6 (ref 5–15)
BUN: 13 mg/dL (ref 8–23)
CO2: 26 mmol/L (ref 22–32)
Calcium: 9.4 mg/dL (ref 8.9–10.3)
Chloride: 102 mmol/L (ref 98–111)
Creatinine, Ser: 0.88 mg/dL (ref 0.44–1.00)
GFR, Estimated: >60 mL/min (ref >60)
Glucose, Bld: 121 mg/dL — ABNORMAL HIGH (ref 70–99)
Potassium: 3.1 mmol/L — ABNORMAL LOW (ref 3.5–5.1)
Sodium: 134 mmol/L — ABNORMAL LOW (ref 135–145)

## 2020-08-22 LAB — CBC WITH DIFFERENTIAL/PLATELET
Abs Immature Granulocytes: 0.05 10*3/uL (ref 0.00–0.07)
Basophils Absolute: 0 10*3/uL (ref 0.0–0.1)
Basophils Relative: 0 %
Eosinophils Absolute: 0.5 10*3/uL (ref 0.0–0.5)
Eosinophils Relative: 5 %
HCT: 33.9 % — ABNORMAL LOW (ref 36.0–46.0)
Hemoglobin: 11.7 g/dL — ABNORMAL LOW (ref 12.0–15.0)
Immature Granulocytes: 1 %
Lymphocytes Relative: 16 %
Lymphs Abs: 1.6 10*3/uL (ref 0.7–4.0)
MCH: 31.8 pg (ref 26.0–34.0)
MCHC: 34.5 g/dL (ref 30.0–36.0)
MCV: 92.1 fL (ref 80.0–100.0)
Monocytes Absolute: 1.3 10*3/uL — ABNORMAL HIGH (ref 0.1–1.0)
Monocytes Relative: 13 %
Neutro Abs: 6.6 10*3/uL (ref 1.7–7.7)
Neutrophils Relative %: 65 %
Platelets: 194 10*3/uL (ref 150–400)
RBC: 3.68 MIL/uL — ABNORMAL LOW (ref 3.87–5.11)
RDW: 13.5 % (ref 11.5–15.5)
WBC: 10 10*3/uL (ref 4.0–10.5)
nRBC: 0 % (ref 0.0–0.2)

## 2020-08-22 LAB — MAGNESIUM: Magnesium: 1.8 mg/dL (ref 1.7–2.4)

## 2020-08-22 MED ORDER — POTASSIUM CHLORIDE CRYS ER 20 MEQ PO TBCR
40.0000 meq | EXTENDED_RELEASE_TABLET | Freq: Two times a day (BID) | ORAL | Status: DC
  Administered 2020-08-22 – 2020-08-23 (×3): 40 meq via ORAL
  Filled 2020-08-22 (×3): qty 2

## 2020-08-22 MED ORDER — TRAMADOL HCL 50 MG PO TABS
50.0000 mg | ORAL_TABLET | Freq: Four times a day (QID) | ORAL | Status: DC | PRN
  Administered 2020-08-22: 50 mg via ORAL
  Filled 2020-08-22: qty 1

## 2020-08-22 NOTE — Evaluation (Signed)
Occupational Therapy Evaluation Patient Details Name: Terri King MRN: [id number] DOB: 1934/04/04 Today's Date: 08/22/2020    History of Present Illness 86 year old female who was admitted after being found on the floor by family. patient was noted to have had syncopal episode.  found to have left lateral femoral condyle fracture, ortho was consulted, Dr.Lucey recommending knee immoblizer and touch down weightbearing. CT of head was negative. MRI pending. patients past medical history was significant for MI, OA, L TKA, CAD.   Clinical Impression   Patient is an 86 year old female with above diagnosis. Patient was noted to have had a significant decline in the ability to complete ADLs and functional mobility at prior level with current touch down weight bearing restrictions. Patient was living home alone with independence in ADLs and IADLs. Currently, patient is max A for supine to sit on edge of bed with increased time to follow cues and maintain weight bearing restrictions. Patient reported having pain in left shoulder with unknown etiology with noted pain with movement. Patient's son who was present reported that this is something she has had with noted issues attempting to maneuver seatbelt prior to this most recent fall. Patient unable to tolerate external rotation or Active range of motion past 30 degrees without pain. Patient was able to AROM RUE WFL with no signs of pain noted.  Patient was educated on importance of telling nursing and MD staff about pain in left shoulder. Patient then asked " what about the right shoulder?" Patient was asked if she had pain in right shoulder. Patient reported " no I do not". Patient was re educated on importance of telling nursing and MD about all pain wherever it may be to be able to treat it. Patient verbalized understanding.  Patient required mod A x2 for sit to stand with education on keeping weight on RLE and BUE to maintain weight bearing status. Patient  was noted to have increased difficulty with touch down weight bearing during sit to stand and stand to sit transitioning. While standing with BUE support and mod A x2 patient was able to maintain WB status. Patient would benefit from transitioning to skilled nursing facility at time of discharge to continue to improve in ability to participate in ADLs with current weight bearing restrictions. Patient and son are agreeable to SNF placement at time of discharge.    Follow Up Recommendations  SNF    Equipment Recommendations  Tub/shower bench;3 in 1 bedside commode    Recommendations for Other Services       Precautions / Restrictions Precautions Precautions: Fall Required Braces or Orthoses: Knee Immobilizer - Left Knee Immobilizer - Left: On at all times Restrictions Weight Bearing Restrictions: Yes LLE Weight Bearing: Touchdown weight bearing      Mobility Bed Mobility Overal bed mobility: Needs Assistance Bed Mobility: Supine to Sit     Supine to sit: Max assist;+2 for safety/equipment     General bed mobility comments: patient required step by step cues for supine to sit on edge of bed    Transfers Overall transfer level: Needs assistance Equipment used: Rolling walker (2 wheeled) Transfers: Sit to/from Stand Sit to Stand: +2 physical assistance;Mod assist         General transfer comment: patient required multimodal cues to maintain WB restrictions. once in standing patient was able to maintain WB resctictions with continued encouragement to keep weight on R side (initial difficulty with TDWB, improved iwth time and cues)    Balance  Overall balance assessment: Needs assistance;History of Falls Sitting-balance support: Single extremity supported Sitting balance-Leahy Scale: Fair Sitting balance - Comments: repeated posterior LOB, cues to maintain midline Postural control: Posterior lean Standing balance support: Bilateral upper extremity supported Standing  balance-Leahy Scale: Poor Standing balance comment: patient needs BUE support,  RW to maintain WB status in standing and intermittent external assist                           ADL either performed or assessed with clinical judgement   ADL Overall ADL's : Needs assistance/impaired Eating/Feeding: Set up;Sitting   Grooming: Oral care;Sitting   Upper Body Bathing: Minimal assistance;Sitting   Lower Body Bathing: Maximal assistance;Sitting/lateral leans   Upper Body Dressing : Minimal assistance;Sitting   Lower Body Dressing: Sitting/lateral leans;Maximal assistance   Toilet Transfer: +2 for physical assistance;Maximal assistance Toilet Transfer Details (indicate cue type and reason): patient was having a hard time with maintaining TDWB status at first attempt to sit to stand with rolling walker and stand to sit as well. Toileting- Clothing Manipulation and Hygiene: Sitting/lateral lean;Maximal assistance       Functional mobility during ADLs: Moderate assistance;+2 for physical assistance;+2 for safety/equipment General ADL Comments: cues to maintain WB restrictions.     Vision   Vision Assessment?: No apparent visual deficits Additional Comments: patient was having a hard time following cues and responding to questions on this date. need to further look into vision during next session.                Pertinent Vitals/Pain Pain Assessment: Faces Faces Pain Scale: Hurts little more Pain Location: patient was noted to have pain in left shoulder with unknown etiology. L side is dominant. Pain Descriptors / Indicators: Grimacing Pain Intervention(s): Limited activity within patient's tolerance;Monitored during session;Premedicated before session     Hand Dominance Left   Extremity/Trunk Assessment Upper Extremity Assessment Upper Extremity Assessment: Defer to OT evaluation LUE Deficits / Details: patient has injury of unknown etiology with patients son stating  that she has increased difficulty with seat belt use with that UE at baseline. patient did not initiate to use left hand first but would state that she was left handed multiple times during session. patietn has pending MRI at this time. patient unable to AROM left shoulder past 30 degrees without pain. patient tolerated AAROM to about 70 degrees. patient unable to tolerate external rotation. patient was unable to follow finger to nose test with increased time and mofidied arm lefthn to account for LUE pain. nursing made aware of concerns over left shoulder. noted to have imaging of right shoulder in chart. LUE: Unable to fully assess due to pain LUE Coordination:  (patient unable to complete finger to nose test bilaterally with modified hand placement. patient unable to touch same spot twice.)   Lower Extremity Assessment Lower Extremity Assessment: LLE deficits/detail LLE Deficits / Details: ankle WFL LLE: Unable to fully assess due to immobilization   Cervical / Trunk Assessment Cervical / Trunk Assessment: Normal   Communication Communication Communication: No difficulties   Cognition Arousal/Alertness: Awake/alert Behavior During Therapy: WFL for tasks assessed/performed Overall Cognitive Status: Within Functional Limits for tasks assessed Area of Impairment: Following commands                       Following Commands: Follows one step commands with increased time;Follows multi-step commands with increased time       General  Comments: patient was noted to take increased time to answer questions and was unable to report injury to left shoulder without attempting to move to be reminded.                    Home Living Family/patient expects to be discharged to:: Skilled nursing facility Living Arrangements: Alone   Type of Home: House       Home Layout: Bed/bath upstairs;Multi-level Alternate Level Stairs-Number of Steps: 10+ steps each level              Home Equipment: Walker - 2 wheels;Other (comment)   Additional Comments: lift chair      Prior Functioning/Environment Level of Independence: Independent                 OT Problem List: Decreased strength;Impaired vision/perception;Decreased knowledge of use of DME or AE;Decreased coordination;Decreased activity tolerance;Impaired UE functional use;Impaired balance (sitting and/or standing);Pain;Decreased safety awareness      OT Treatment/Interventions: Self-care/ADL training;Therapeutic exercise;Patient/family education;Balance training;Therapeutic activities;DME and/or AE instruction    OT Goals(Current goals can be found in the care plan section) Acute Rehab OT Goals Patient Stated Goal: patient wants to be able to get back home at [facility] OT Goal Formulation: With patient Time For Goal Achievement: 09/05/20 Potential to Achieve Goals: Good ADL Goals Pt Will Perform Lower Body Dressing: with min assist Pt Will Transfer to Toilet: with min assist;stand pivot transfer;bedside commode Pt Will Perform Toileting - Clothing Manipulation and hygiene: sitting/lateral leans;with supervision  OT Frequency: Min 2X/week   Barriers to D/C:    patient is currently living at home alone.       Co-evaluation PT/OT/SLP Co-Evaluation/Treatment: Yes Reason for Co-Treatment: For patient/therapist safety;To address functional/ADL transfers PT goals addressed during session: Mobility/safety with mobility;Proper use of DME OT goals addressed during session: ADL's and self-care      AM-PAC OT "6 Clicks" Daily Activity     Outcome Measure Help from another person eating meals?: A Little Help from another person taking care of personal grooming?: A Little Help from another person toileting, which includes using toliet, bedpan, or urinal?: A Lot Help from another person bathing (including washing, rinsing, drying)?: A Lot Help from another person to put on and taking off regular upper body  clothing?: A Lot Help from another person to put on and taking off regular lower body clothing?: A Lot 6 Click Score: 14   End of Session Equipment Utilized During Treatment: Gait belt;Rolling walker;Left knee immobilizer Nurse Communication: Other (comment) (nursing was consulted about patients pain medication and participation in therapy.)  Activity Tolerance:   Patient left: in bed;with bed alarm set;with family/visitor present  OT Visit Diagnosis: Pain;Muscle weakness (generalized) (M62.81) Pain - Right/Left: Left Pain - part of body: Shoulder                Time: 5945-8592 OT Time Calculation (min): 17 min Charges:  OT General Charges $OT Visit: 1 Visit OT Evaluation $OT Eval Moderate Complexity: 1 Mod  Sharyn Blitz OTR/L, MS Acute Rehabilitation Department Office# [phone] Pager# [phone]   Chalmers Guest Ettore Trebilcock 08/22/2020, 3:21 PM

## 2020-08-22 NOTE — Progress Notes (Signed)
Patient ID: Terri King, female   DOB: 20-Dec-1934, 86 y.o.   MRN: [id number]  PROGRESS NOTE    Terri King  NWG:[id number] DOB: June 15, 1934 DOA: 08/19/2020 PCP: Tresa Garter, MD    Brief Narrative:  Terri King is a 86 y.o. female with medical history significant for HTN, Aortic stenosis-moderate,CAD, hx of MI, CAD, OA who presents after being found on the floor at home by family today. She reports she passed out last night and laid on the floor overnight. She does not remember what made her pass out and states she does not remember being dizzy or having chest pain or palpitations prior to her passing out.  She has had no chest pain, shortness of breath, cough, fever, nausea, vomiting, diarrhea since she woke up on the floor.  She states she was at the base of her steps and does not remember she fell down the steps.  She states she has not had any numbness or weakness of her extremities and has had no visual change or slurred speech.  She is generally in good health and lives independently alone.  She was unable to stand up or ambulate on her left leg.  States she has never had an episode like this in the past.  She reports she has never had seizures. She denies tobacco, alcohol, illicit drug use. Found to have a lateral condyle fracture of the left femur otherwise no acute injuries The patient has been evaluated by Dr. Sherlean Foot. He has recommended a knee immobilizer, TDWB, and to follow up with [facility] in 2 weeks.  Assessment & Plan:   Principal Problem:   Syncope Active Problems:   HYPOKALEMIA   Essential hypertension   Aortic stenosis   Fracture of femoral condyle, closed (HCC)   Leukocytosis   Altered mental status According to the patient's son her mentation seems a bit slowed.  She also seems to be not using her left extremity is much as she normally would and there is concerned that she might of possibly have a stroke which might have led to her fall.   She had a negative CT in the ED but will check MRI just to be sure. She is on Seroquel and I cannot find out exactly why I do not think she took this prior to admission and this may be something to peel off if her MRI is negative.  Fracture of the femoral condyle, closed Status post orthopedics consultation recommended knee immobilizer and follow-up in the office in 2 weeks PT consult pending Tramadol for pain  Aortic stenosis Echocardiogram performed on 08/20/2020 shows EF of 60 to 70%, mild LVH, grade 1 diastolic dysfunction.  Outpatient cardiac follow-up is recommended  Essential hypertension Continue Norvasc and Maxide  Hypokalemia Replete and trend  Leukocytosis Resolved    DVT prophylaxis: Lovenox SQ Code Status: Full code  Family Communication: Son at bedside Disposition Plan: Yet to be determined.  Based on PT, completed work-up.  Son thinks her mom may benefit from a stay in rehab as she has stairs in her house and with the knee immobilizer may be difficult to navigate that. Patient lives alone currently and has a son who lives in [place].   Consultants:  Orthopedics  Procedures: None  Antimicrobials: Anti-infectives (From admission, onward)    Start     Dose/Rate Route Frequency Ordered Stop   08/21/20 1000  valACYclovir (VALTREX) tablet 1,000 mg  Status:  Discontinued  1,000 mg Oral 3 times daily 08/21/20 0756 08/21/20 1105        Subjective: She reports that she does not feel quite like herself.  She states she has not felt like herself since she got up from her fall.  Objective: Vitals:   08/21/20 2038 08/22/20 0608 08/22/20 1021 08/22/20 1023  BP: (!) 158/58 (!) 159/61 (!) 136/53 (!) 136/53  Pulse: 79 74  71  Resp: 19 17    Temp: 98.3 F (36.8 C) 98.2 F (36.8 C)    TempSrc: Oral     SpO2: 99% 100%  99%  Weight:      Height:        Intake/Output Summary (Last 24 hours) at 08/22/2020 1104 Last data filed at 08/22/2020 0610 Gross per 24  hour  Intake --  Output 1250 ml  Net -1250 ml   Filed Weights   08/20/20 1109  Weight: 75.2 kg    Examination:  General exam: Appears calm and comfortable  Respiratory system: Clear to auscultation. Respiratory effort normal. Cardiovascular system: S1 & S2 heard, RRR.  Gastrointestinal system: Abdomen is nondistended, soft and nontender.  Central nervous system: Alert and oriented. No focal neurological deficits.  She was unable to King pills out of the nurses hand today and seems to be overshooting those. Extremities: Symmetric  Skin: No rashes Psychiatry: Judgement and insight appear normal. Mood & affect appropriate.    Data Reviewed: I have personally reviewed following labs and imaging studies  CBC: Recent Labs  Lab 08/19/20 1700 08/20/20 0047 08/20/20 0400 08/22/20 0405  WBC 16.1* 16.5* 17.8* 10.0  NEUTROABS 14.5*  --   --  6.6  HGB 12.2 10.7* 11.5* 11.7*  HCT 34.9* 30.6* 33.3* 33.9*  MCV 90.4 92.2 90.5 92.1  PLT 205 165 180 194   Basic Metabolic Panel: Recent Labs  Lab 08/19/20 1700 08/19/20 1841 08/20/20 0047 08/20/20 0400 08/22/20 0405  NA 136  --  137 135 134*  K 2.6*  --  2.7* 3.4* 3.1*  CL 103  --  106 106 102  CO2 24  --  24 22 26   GLUCOSE 130*  --  152* 136* 121*  BUN 13  --  9 8 13   CREATININE 0.96  --  0.85 0.83  0.70 0.88  CALCIUM 9.8  --  8.8* 9.0 9.4  MG  --  2.0  --   --  1.8    Cardiac Enzymes: Recent Labs  Lab 08/19/20 1700  CKTOTAL 155     Recent Results (from the past 240 hour(s))  SARS CORONAVIRUS 2 (TAT 6-24 HRS) Nasopharyngeal Nasopharyngeal Swab     Status: None   Collection Time: 08/20/20  6:31 AM   Specimen: Nasopharyngeal Swab  Result Value Ref Range Status   SARS Coronavirus 2 NEGATIVE NEGATIVE Final    Comment: (NOTE) SARS-CoV-2 target nucleic acids are NOT DETECTED.  The SARS-CoV-2 RNA is generally detectable in upper and lower respiratory specimens during the acute phase of infection. Negative results do  not preclude SARS-CoV-2 infection, do not rule out co-infections with other pathogens, and should not be used as the sole basis for treatment or other patient management decisions. Negative results must be combined with clinical observations, patient history, and epidemiological information. The expected result is Negative.  Fact Sheet for Patients: 10/20/20  Fact Sheet for Healthcare Providers: 10/21/20  This test is not yet approved or cleared by the [employer] FDA and  has been authorized for detection and/or  diagnosis of SARS-CoV-2 by FDA under an Emergency Use Authorization (EUA). This EUA will remain  in effect (meaning this test can be used) for the duration of the COVID-19 declaration under Se ction 564(b)(1) of the Act, 21 U.S.C. section 360bbb-3(b)(1), unless the authorization is terminated or revoked sooner.  Performed at [facility], [address], [place], [place] [address]       Radiology Studies: ECHOCARDIOGRAM COMPLETE  Result Date: 08/20/2020    ECHOCARDIOGRAM REPORT   Patient Name:   Terri King Date of Exam: 08/20/2020 Medical Rec #:  [id number]       Height:       63.0 in Accession #:    [id number]      Weight:       165.7 lb Date of Birth:  April 02, 1934       BSA:          1.785 m Patient Age:    86 years        BP:           248/59 mmHg Patient Gender: F               HR:           79 bpm. Exam Location:  Inpatient Procedure: 2D Echo, 3D Echo, Cardiac Doppler, Color Doppler and Strain Analysis Indications:    I35.0 Nonrheumatic aortic (valve) stenosis  History:        Patient has prior history of Echocardiogram examinations, most                 recent 04/08/2019. Aortic Valve Disease, Signs/Symptoms:Syncope,                 Fever and Chest Pain; Risk Factors:Hypertension and                 Dyslipidemia. Edema. Aortic stenosis.  Sonographer:    Sheralyn Boatman RDCS Referring Phys: [id number] Carlton Adam  Sonographer Comments: Global longitudinal strain was attempted. Patient supine. Could not turn due to broken leg. IMPRESSIONS  1. The aortic valve is calcified. There is severe calcifcation of the aortic valve. There is severe thickening of the aortic valve. Aortic valve regurgitation is mild. Severe aortic valve stenosis. AVA 0.9cm2, mean gradient , peak gradient ,  Vmax 4.37m/s  2. Left ventricular ejection fraction, by estimation, is 65 to 70%. Left ventricular ejection fraction by 3D volume is 71 %. The left ventricle has normal function. The left ventricle has no regional wall motion abnormalities. There is moderate asymmetric hypertrophy of the basal-septum. The rest of the LV segments demonstrate mild left ventricular hypertrophy. Left ventricular diastolic parameters are consistent with Grade I diastolic dysfunction (impaired relaxation). Elevated left atrial pressure. The average left ventricular global longitudinal strain is -20.2 %. The global longitudinal strain is normal.  3. Right ventricular systolic function is normal. The right ventricular size is normal. There is severely elevated pulmonary artery systolic pressure. The estimated right ventricular systolic pressure is 86.2 mmHg.  4. The mitral valve is abnormal. There is moderate thickening of the mitral valve leaflet(s). There is moderate calcification of the mitral valve leaflet(s). Moderate to severe mitral annular calcification. Trivial mitral valve regurgitation. Mild calcific mitral stenosis with MVA 1.5cm2 by continuity, mean gradient at HR 74bpm.  5. The inferior vena cava is dilated in size with >50% respiratory variability, suggesting right atrial pressure of 8 mmHg. Comparison(s): Compared to prior TTE in 03/2019, the aortic stenosis  is now severe (previously moderate) and there is severe pulmonary hypertension with PASP 67mmHg. FINDINGS  Left Ventricle: Left ventricular ejection fraction, by estimation, is  65 to 70%. Left ventricular ejection fraction by 3D volume is 71 %. The left ventricle has normal function. The left ventricle has no regional wall motion abnormalities. The average left ventricular global longitudinal strain is -20.2 %. The global longitudinal strain is normal. The left ventricular internal cavity size was normal in size. There is moderate asymmetric hypertrophy of the basal-septum. The rest of the LV segments demonstrate mild left ventricular hypertrophy. Left ventricular diastolic parameters are consistent with Grade I diastolic dysfunction (impaired relaxation). Elevated left atrial pressure. Right Ventricle: The right ventricular size is normal. No increase in right ventricular wall thickness. Right ventricular systolic function is normal. There is severely elevated pulmonary artery systolic pressure. The tricuspid regurgitant velocity is 4.22 m/s, and with an assumed right atrial pressure of 15 mmHg, the estimated right ventricular systolic pressure is 86.2 mmHg. Left Atrium: Left atrial size was normal in size. Right Atrium: Right atrial size was normal in size. Pericardium: There is no evidence of pericardial effusion. Mitral Valve: The mitral valve is abnormal. There is moderate thickening of the mitral valve leaflet(s). There is moderate calcification of the mitral valve leaflet(s). Moderate to severe mitral annular calcification. Trivial mitral valve regurgitation. MV peak gradient, 14.8 mmHg. The mean mitral valve gradient is 4.0 mmHg. Mild calcific mitral stenosis with MVA 1.5cm2 by continuity, mean gradient 4mmHg at HR 74bpm. Tricuspid Valve: The tricuspid valve is normal in structure. Tricuspid valve regurgitation is mild. Aortic Valve: There is severe calcifcation of the aortic valve. There is severe thickening of the aortic valve. Aortic valve regurgitation is mild. Aortic regurgitation PHT measures 426 msec. Severe aortic stenosis is present. AVA 0.9cm2, mean gradient 38mmHg, peak  gradient 67mmHg, Vmax 4.2035m/s. Pulmonic Valve: The pulmonic valve was not well visualized. Pulmonic valve regurgitation is trivial. Aorta: The aortic root and ascending aorta are structurally normal, with no evidence of dilitation. Venous: The inferior vena cava is dilated in size with greater than 50% respiratory variability, suggesting right atrial pressure of 8 mmHg. IAS/Shunts: The interatrial septum is aneurysmal. No atrial level shunt detected by color flow Doppler.  LEFT VENTRICLE PLAX 2D LVIDd:         2.90 cm         Diastology LVIDs:         1.70 cm         LV e' medial:    5.22 cm/s LV PW:         1.50 cm         LV E/e' medial:  22.5 LV IVS:        1.40 cm         LV e' lateral:   7.51 cm/s LVOT diam:     1.80 cm         LV E/e' lateral: 15.6 LV SV:         68 LV SV Index:   38              2D LVOT Area:     2.54 cm        Longitudinal                                Strain  2D Strain GLS  -20.2 % LV Volumes (MOD)               Avg: LV vol d, MOD    57.6 ml A2C:                           3D Volume EF LV vol d, MOD    48.3 ml       LV 3D EF:    Left A4C:                                        ventricular LV vol s, MOD    17.3 ml                    ejection A2C:                                        fraction by LV vol s, MOD    11.3 ml                    3D volume A4C:                                        is 71 %. LV SV MOD A2C:   40.3 ml LV SV MOD A4C:   48.3 ml LV SV MOD BP:    39.0 ml       3D Volume EF:                                3D EF:        71 % RIGHT VENTRICLE             IVC RV S prime:     11.90 cm/s  IVC diam: 2.00 cm TAPSE (M-mode): 2.2 cm LEFT ATRIUM             Index       RIGHT ATRIUM           Index LA diam:        3.70 cm 2.07 cm/m  RA Area:     14.40 cm LA Vol (A2C):   40.8 ml 22.86 ml/m RA Volume:   35.60 ml  19.94 ml/m LA Vol (A4C):   32.2 ml 18.04 ml/m LA Biplane Vol: 37.8 ml 21.18 ml/m  AORTIC VALVE AV Area (Vmax):    0.94 cm AV Area (Vmean):    0.92 cm AV Area (VTI):     0.92 cm AV Vmax:           381.00 cm/s AV Vmean:          265.333 cm/s AV VTI:            0.743 m AV Peak Grad:      58.1 mmHg AV Mean Grad:      33.0 mmHg LVOT Vmax:         140.00 cm/s LVOT Vmean:        95.900 cm/s LVOT VTI:          0.269 m LVOT/AV VTI ratio: 0.36 AI  PHT:            426 msec  AORTA Ao Root diam: 2.80 cm Ao Asc diam:  3.10 cm MITRAL VALVE                TRICUSPID VALVE MV Area (PHT): 2.79 cm     TR Peak grad:   71.2 mmHg MV Area VTI:   1.38 cm     TR Vmax:        422.00 cm/s MV Peak grad:  14.8 mmHg MV Mean grad:  4.0 mmHg     SHUNTS MV Vmax:       1.92 m/s     Systemic VTI:  0.27 m MV Vmean:      90.3 cm/s    Systemic Diam: 1.80 cm MV Decel Time: 272 msec MV E velocity: 117.50 cm/s MV A velocity: 175.00 cm/s MV E/A ratio:  0.67 Laurance Flatten MD Electronically signed by Laurance Flatten MD Signature Date/Time: 08/20/2020/2:56:15 PM    Final      Scheduled Meds:  amLODipine  5 mg Oral Daily   enoxaparin (LOVENOX) injection  40 mg Subcutaneous Q24H   escitalopram  10 mg Oral Daily   famotidine  40 mg Oral Daily   gabapentin  300 mg Oral TID   metoprolol succinate  50 mg Oral Daily   potassium chloride SA  40 mEq Oral BID   QUEtiapine  25 mg Oral q AM   QUEtiapine  50 mg Oral QHS   temazepam  30 mg Oral QHS   triamterene-hydrochlorothiazide  1 tablet Oral Daily   Continuous Infusions:  lactated ringers 75 mL/hr at 08/20/20 2320     LOS: 3 days    Reva Bores, MD 08/22/2020 11:04 AM [phone] [facility] If 7PM-7AM, please contact night-coverage 08/22/2020, 11:04 AM

## 2020-08-22 NOTE — Evaluation (Signed)
Physical Therapy Evaluation Patient Details Name: Terri King MRN: [id number] DOB: 07-27-1934 Today's Date: 08/22/2020   History of Present Illness  86 year old female who was admitted after being found on the floor by family. patient was noted to have had syncopal episode.  found to have left lateral femoral condyle fracture, ortho was consulted, Dr.Lucey recommending knee immoblizer and touch down weightbearing. CT of head was negative. MRI pending. patients past medical history was significant for MI, OA, L TKA, CAD.  Clinical Impression  Pt admitted with above diagnosis.  Pt independent at her baseline, has had multiple recent falls per family. Lives in multi-level home, uses all levels and will need SNF post acute to maximize safety and independence   Pt currently with functional limitations due to the deficits listed below (see PT Problem List). Pt will benefit from skilled PT to increase their independence and safety with mobility to allow discharge to the venue listed below.       Follow Up Recommendations SNF    Equipment Recommendations  None recommended by PT    Recommendations for Other Services       Precautions / Restrictions Precautions Precautions: Fall Required Braces or Orthoses: Knee Immobilizer - Left Knee Immobilizer - Left: On at all times Restrictions Weight Bearing Restrictions: Yes LLE Weight Bearing: Touchdown weight bearing      Mobility  Bed Mobility Overal bed mobility: Needs Assistance Bed Mobility: Supine to Sit     Supine to sit: Max assist;+2 for safety/equipment     General bed mobility comments: patient required step by step cues for supine to sit on edge of bed    Transfers Overall transfer level: Needs assistance Equipment used: Rolling walker (2 wheeled) Transfers: Sit to/from Stand Sit to Stand: +2 physical assistance;Mod assist         General transfer comment: patient required multimodal cues to maintain WB restrictions.  once in standing patient was able to maintain WB resctictions with continued encouragement to keep weight on R side (initial difficulty with TDWB, improved iwth time and cues)  Ambulation/Gait                Stairs            Wheelchair Mobility    Modified Rankin (Stroke Patients Only)       Balance Overall balance assessment: Needs assistance;History of Falls Sitting-balance support: Single extremity supported Sitting balance-Leahy Scale: Fair Sitting balance - Comments: repeated posterior LOB, cues to maintain midline Postural control: Posterior lean Standing balance support: Bilateral upper extremity supported Standing balance-Leahy Scale: Poor Standing balance comment: patient needs BUE support,  RW to maintain WB status in standing and intermittent external assist                             Pertinent Vitals/Pain Pain Assessment: Faces Faces Pain Scale: Hurts little more Pain Location: patient was noted to have pain in left shoulder with unknown etiology. L side is dominant. Pain Descriptors / Indicators: Grimacing Pain Intervention(s): Limited activity within patient's tolerance;Monitored during session;Premedicated before session    Home Living Family/patient expects to be discharged to:: Skilled nursing facility Living Arrangements: Alone   Type of Home: House       Home Layout: Bed/bath upstairs;Multi-level Home Equipment: Dan Humphreys - 2 wheels;Other (comment) Additional Comments: lift chair    Prior Function Level of Independence: Independent  Hand Dominance   Dominant Hand: Left    Extremity/Trunk Assessment   Upper Extremity Assessment Upper Extremity Assessment: Defer to OT evaluation LUE Deficits / Details: patient has injury of unknown etiology with patients son stating that she has increased difficulty with seat belt use with that UE at baseline. patient did not initiate to use left hand first but would  state that she was left handed multiple times during session. patietn has pending MRI at this time. patient unable to AROM left shoulder past 30 degrees without pain. patient tolerated AAROM to about 70 degrees. patient unable to tolerate external rotation. patient was unable to follow finger to nose test with increased time and mofidied arm lefthn to account for LUE pain. nursing made aware of concerns over left shoulder. noted to have imaging of right shoulder in chart. LUE: Unable to fully assess due to pain LUE Coordination:  (patient unable to complete finger to nose test bilaterally with modified hand placement. patient unable to touch same spot twice.)    Lower Extremity Assessment Lower Extremity Assessment: LLE deficits/detail LLE Deficits / Details: ankle WFL LLE: Unable to fully assess due to immobilization    Cervical / Trunk Assessment Cervical / Trunk Assessment: Normal  Communication   Communication: No difficulties  Cognition Arousal/Alertness: Awake/alert Behavior During Therapy: WFL for tasks assessed/performed Overall Cognitive Status: Within Functional Limits for tasks assessed Area of Impairment: Following commands                       Following Commands: Follows one step commands with increased time;Follows multi-step commands with increased time       General Comments: patient was noted to take increased time to answer questions and was unable to report injury to left shoulder without attempting to move to be reminded.      General Comments      Exercises     Assessment/Plan    PT Assessment Patient needs continued PT services  PT Problem List Decreased strength;Decreased mobility;Decreased activity tolerance;Decreased balance;Pain;Decreased knowledge of use of DME       PT Treatment Interventions DME instruction;Therapeutic activities;Gait training;Functional mobility training;Therapeutic exercise;Patient/family education    PT Goals  (Current goals can be found in the Care Plan section)  Acute Rehab PT Goals Patient Stated Goal: patient wants to be able to get back home at [facility] PT Goal Formulation: With patient/family Time For Goal Achievement: 09/05/20 Potential to Achieve Goals: Good    Frequency Min 3X/week   Barriers to discharge        Co-evaluation   Reason for Co-Treatment: For patient/therapist safety;To address functional/ADL transfers PT goals addressed during session: Mobility/safety with mobility;Proper use of DME OT goals addressed during session: ADL's and self-care       AM-PAC PT "6 Clicks" Mobility  Outcome Measure Help needed turning from your back to your side while in a flat bed without using bedrails?: A Lot Help needed moving from lying on your back to sitting on the side of a flat bed without using bedrails?: A Lot Help needed moving to and from a bed to a chair (including a wheelchair)?: A Lot Help needed standing up from a chair using your arms (e.g., wheelchair or bedside chair)?: A Lot Help needed to walk in hospital room?: Total Help needed climbing 3-5 steps with a railing? : Total 6 Click Score: 10    End of Session Equipment Utilized During Treatment: Gait belt;Right knee immobilizer Activity Tolerance: Patient tolerated  treatment well Patient left: in bed;with call bell/phone within reach;with bed alarm set;with family/visitor present   PT Visit Diagnosis: Other abnormalities of gait and mobility (R26.89);Muscle weakness (generalized) (M62.81);History of falling (Z91.81);Unsteadiness on feet (R26.81)    Time: 5361-4431 PT Time Calculation (min) (ACUTE ONLY): 29 min   Charges:   PT Evaluation $PT Eval Low Complexity: 1 Low          Elonna Mcfarlane, PT  Acute Rehab Dept ([facility]) [phone] Pager [phone]  08/22/2020   [facility] 08/22/2020, 3:21 PM

## 2020-08-22 NOTE — Progress Notes (Signed)
End of Shift:  No significant changes or events overnight. Patient remains in bed resting at shift change. Call bell in reach. No signs of distress. Patient seems to be doing better than previous night as far as forgetfulness and impulsive behavior. Patient was able to sleep through the night. Call bell in reach.

## 2020-08-23 ENCOUNTER — Inpatient Hospital Stay (HOSPITAL_COMMUNITY): Payer: Medicare HMO

## 2020-08-23 DIAGNOSIS — I639 Cerebral infarction, unspecified: Secondary | ICD-10-CM | POA: Diagnosis not present

## 2020-08-23 DIAGNOSIS — I350 Nonrheumatic aortic (valve) stenosis: Secondary | ICD-10-CM | POA: Diagnosis not present

## 2020-08-23 DIAGNOSIS — R55 Syncope and collapse: Secondary | ICD-10-CM

## 2020-08-23 DIAGNOSIS — S72411D Displaced unspecified condyle fracture of lower end of right femur, subsequent encounter for closed fracture with routine healing: Secondary | ICD-10-CM | POA: Diagnosis not present

## 2020-08-23 DIAGNOSIS — Z8673 Personal history of transient ischemic attack (TIA), and cerebral infarction without residual deficits: Secondary | ICD-10-CM

## 2020-08-23 LAB — TSH: TSH: 1.542 u[IU]/mL (ref 0.350–4.500)

## 2020-08-23 LAB — LIPID PANEL
Cholesterol: 153 mg/dL (ref 0–200)
HDL: 41 mg/dL (ref >40)
LDL Cholesterol: 83 mg/dL (ref 0–99)
Total CHOL/HDL Ratio: 3.7 RATIO
Triglycerides: 144 mg/dL (ref <150)
VLDL: 29 mg/dL (ref 0–40)

## 2020-08-23 LAB — HEMOGLOBIN A1C
Hgb A1c MFr Bld: 5.2 % (ref 4.8–5.6)
Mean Plasma Glucose: 102.54 mg/dL

## 2020-08-23 MED ORDER — GABAPENTIN 100 MG PO CAPS
100.0000 mg | ORAL_CAPSULE | Freq: Three times a day (TID) | ORAL | Status: DC
  Administered 2020-08-23 – 2020-09-02 (×28): 100 mg via ORAL
  Filled 2020-08-23 (×28): qty 1

## 2020-08-23 MED ORDER — SODIUM CHLORIDE 0.9% FLUSH
3.0000 mL | Freq: Two times a day (BID) | INTRAVENOUS | Status: DC
  Administered 2020-08-23 – 2020-08-30 (×8): 3 mL via INTRAVENOUS

## 2020-08-23 MED ORDER — GADOBUTROL 1 MMOL/ML IV SOLN
7.0000 mL | Freq: Once | INTRAVENOUS | Status: AC | PRN
  Administered 2020-08-23: 7 mL via INTRAVENOUS

## 2020-08-23 MED ORDER — SODIUM CHLORIDE 0.9% FLUSH: 3.0000 mL | INTRAVENOUS | Status: DC | PRN

## 2020-08-23 MED ORDER — SODIUM CHLORIDE 0.9 % IV SOLN: 250.0000 mL | INTRAVENOUS | Status: DC | PRN

## 2020-08-23 MED ORDER — IBUPROFEN 400 MG PO TABS
200.0000 mg | ORAL_TABLET | Freq: Four times a day (QID) | ORAL | Status: DC | PRN
  Administered 2020-08-23 – 2020-09-01 (×4): 200 mg via ORAL
  Filled 2020-08-23 (×4): qty 1

## 2020-08-23 MED ORDER — SODIUM CHLORIDE 0.9 % WEIGHT BASED INFUSION
1.0000 mL/kg/h | INTRAVENOUS | Status: DC
  Administered 2020-08-24 (×2): 1 mL/kg/h via INTRAVENOUS

## 2020-08-23 MED ORDER — POTASSIUM CHLORIDE CRYS ER 20 MEQ PO TBCR
40.0000 meq | EXTENDED_RELEASE_TABLET | Freq: Three times a day (TID) | ORAL | Status: AC
  Administered 2020-08-23 – 2020-08-24 (×3): 40 meq via ORAL
  Filled 2020-08-23 (×5): qty 2

## 2020-08-23 MED ORDER — QUETIAPINE FUMARATE 25 MG PO TABS
25.0000 mg | ORAL_TABLET | Freq: Every day | ORAL | Status: DC
  Administered 2020-08-23 – 2020-09-01 (×10): 25 mg via ORAL
  Filled 2020-08-23 (×10): qty 1

## 2020-08-23 MED ORDER — TEMAZEPAM 15 MG PO CAPS
15.0000 mg | ORAL_CAPSULE | Freq: Every day | ORAL | Status: DC
  Administered 2020-08-23 – 2020-09-01 (×10): 15 mg via ORAL
  Filled 2020-08-23 (×10): qty 1

## 2020-08-23 MED ORDER — SODIUM CHLORIDE 0.9 % WEIGHT BASED INFUSION
3.0000 mL/kg/h | INTRAVENOUS | Status: AC
  Administered 2020-08-24: 3 mL/kg/h via INTRAVENOUS

## 2020-08-23 NOTE — Consult Note (Addendum)
Cardiology Consultation:   Patient ID: Terri King MRN: [id number]; DOB: 08/10/1934  Admit date: 08/19/2020 Date of Consult: 08/23/2020  PCP:  Tresa Garter, MD   [facility] Providers Cardiologist:  Dietrich Pates, MD   {  Patient Profile:   Terri King is a 86 y.o. female with a hx of aortic stenosis, hypertension, hyperlipidemia and osteoarthritis who is being seen 08/23/2020 for the evaluation of syncope and aortic stenosis at the request of Dr. Frederick Peers.   Normal coronary arteries in 1986.  Myoview in 2013 without evidence of ischemia. Echocardiogram 03/2019 with LV function of 65-70%, grade 1 diastolic dysfunction, moderate aortic stenosis with mean gradient 28 mmHg.   History of Present Illness:   [name] admitted with syncope.  Patient is very independent at baseline and lives by herself.  She was living in a motel for past 2 months as her house required work.  She was fairly sedentary there.  She moved back to her house on June 30. After coming to home, she went to utility room on main floor.  Then she was going up on flight of stairs.  However, noted severe fatigue and tiredness, then she passed out.  No chest pain, shortness of breath, palpitation or dizziness.    Found herself on the floor near stair however unable to get up overnight.  Complaining of knee hip and shoulder pain.  CT of head without acute finding.  Work-up revealed lateral condyle fracture of left femur.  Seen by orthopedic and placed on knee immobilizer.  PT recommended SNF.  No immediate plan for surgery.  Pending MRI of brain today.  She was also found severely hypokalemic at 2.6 and received supplement.  Cardiology is asked for further evaluation given suspected cardiogenic syncope and finding of severe arctic stenosis on echocardiogram.  Patient denies chest pain, shortness of breath, lower extremity edema, dizziness or melena.  Intermittent palpitation occurring once every couple of months.  Each  episode lasts for 2 minutes with self resolution.  Mostly noted at night.  Echo 08/20/20  1. The aortic valve is calcified. There is severe calcifcation of the  aortic valve. There is severe thickening of the aortic valve. Aortic valve  regurgitation is mild. Severe aortic valve stenosis. AVA 0.9cm2, mean  gradient , peak gradient ,   Vmax 4.70m/s   2. Left ventricular ejection fraction, by estimation, is 65 to 70%. Left  ventricular ejection fraction by 3D volume is 71 %. The left ventricle has  normal function. The left ventricle has no regional wall motion  abnormalities. There is moderate  asymmetric hypertrophy of the basal-septum. The rest of the LV segments  demonstrate mild left ventricular hypertrophy. Left ventricular diastolic  parameters are consistent with Grade I diastolic dysfunction (impaired  relaxation). Elevated left atrial  pressure. The average left ventricular global longitudinal strain is -20.2  %. The global longitudinal strain is normal.   3. Right ventricular systolic function is normal. The right ventricular  size is normal. There is severely elevated pulmonary artery systolic  pressure. The estimated right ventricular systolic pressure is 86.2 mmHg.   4. The mitral valve is abnormal. There is moderate thickening of the  mitral valve leaflet(s). There is moderate calcification of the mitral  valve leaflet(s). Moderate to severe mitral annular calcification. Trivial  mitral valve regurgitation. Mild  calcific mitral stenosis with MVA 1.5cm2 by continuity, mean gradient  at HR 74bpm.   5. The inferior vena cava is dilated in  size with >50% respiratory  variability, suggesting right atrial pressure of 8 mmHg.   Past Medical History:  Diagnosis Date   Aortic stenosis    Stage B Moderate AS (Echocardiogram 03/2019: EF 65-70, no RWMA, mild LVH, Gr 1 DD, RVSP 37.6 (mild elevation), mild MR, mod AI, mod AS (mean 28 mmHg/peak 52.3 mmHg/LVOT AV VTI  ratio: 0.37)   CAD (coronary artery disease)    DR Tenny Craw; by notes normal cath in 1986, normal stress 2013   Family history of anesthesia complication    NIENCE had sensitivity   History of pneumonia 2008   HTN (hypertension)    dr Posey Rea   Hyperlipemia    MI (myocardial infarction) (HCC)    DR Tenny Craw   Osteoarthritis    Shortness of breath     Past Surgical History:  Procedure Laterality Date   JOINT REPLACEMENT  12/13   L TKR    KNEE ARTHROSCOPY     left   PARTIAL HYSTERECTOMY     TONSILLECTOMY     TOTAL KNEE ARTHROPLASTY  01/23/2012   Procedure: TOTAL KNEE ARTHROPLASTY;  Surgeon: Loreta Ave, MD;  Location: [facility] OR;  Service: Orthopedics;  Laterality: Left;   TOTAL KNEE ARTHROPLASTY  12/'05/2011   left knee    Inpatient Medications: Scheduled Meds:  amLODipine  5 mg Oral Daily   enoxaparin (LOVENOX) injection  40 mg Subcutaneous Q24H   escitalopram  10 mg Oral Daily   famotidine  40 mg Oral Daily   gabapentin  100 mg Oral TID   metoprolol succinate  50 mg Oral Daily   potassium chloride SA  40 mEq Oral BID   QUEtiapine  25 mg Oral QHS   temazepam  15 mg Oral QHS   triamterene-hydrochlorothiazide  1 tablet Oral Daily   Continuous Infusions:  PRN Meds: ibuprofen, nitroGLYCERIN, senna-docusate  Allergies:    Allergies  Allergen Reactions   Codeine Other (See Comments)    Abnormal behavior   Crestor [Rosuvastatin]     Myalgias    Dexilant [Dexlansoprazole]     Made me sick   Ibuprofen Nausea And Vomiting   Simvastatin     REACTION: achy   Tylenol [Acetaminophen]    Iodine Swelling and Rash    Social History:   Social History   Socioeconomic History   Marital status: Widowed    Spouse name: Not on file   Number of children: Not on file   Years of education: Not on file   Highest education level: Not on file  Occupational History   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, industrial/product)  Tobacco Use   Smoking status: Never    Smokeless tobacco: Never  Vaping Use   Vaping Use: Never used  Substance and Sexual Activity   Alcohol use: No   Drug use: No   Sexual activity: Never  Other Topics Concern   Not on file  Social History Narrative   Not on file   Social Determinants of Health   Financial Resource Strain: Low Risk    Difficulty of Paying Living Expenses: Not hard at all  Food Insecurity: No Food Insecurity   Worried About Programme researcher, broadcasting/film/video in the Last Year: Never true   Ran Out of Food in the Last Year: Never true  Transportation Needs: No Transportation Needs   Lack of Transportation (Medical): No   Lack of Transportation (Non-Medical): No  Physical Activity: Insufficiently Active   Days of Exercise per Week: 7  days   Minutes of Exercise per Session: 20 min  Stress: No Stress Concern Present   Feeling of Stress : Not at all  Social Connections: Moderately Integrated   Frequency of Communication with Friends and Family: More than three times a week   Frequency of Social Gatherings with Friends and Family: Once a week   Attends Religious Services: 1 to 4 times per year   Active Member of [employer] or Organizations: No   Attends Banker Meetings: 1 to 4 times per year   Marital Status: Widowed  Catering manager Violence: Not At Risk   Fear of Current or Ex-Partner: No   Emotionally Abused: No   Physically Abused: No   Sexually Abused: No    Family History:   Family History  Problem Relation Age of Onset   Glaucoma Father    Hypertension Mother    Hypertension Other      ROS:  Please see the history of present illness.  All other ROS reviewed and negative.     Physical Exam/Data:   Vitals:   08/22/20 1023 08/22/20 1138 08/22/20 1957 08/23/20 0343  BP: (!) 136/53 (!) 139/44 (!) 144/44 (!) 155/54  Pulse: 71 73 67 65  Resp:  20 16 18   Temp:  (!) 97.5 F (36.4 C) 98.1 F (36.7 C) 98.2 F (36.8 C)  TempSrc:  Oral Oral Oral  SpO2: 99% 99% 100%   Weight:      Height:         Intake/Output Summary (Last 24 hours) at 08/23/2020 1040 Last data filed at 08/23/2020 1019 Gross per 24 hour  Intake 720 ml  Output 350 ml  Net 370 ml   Last 3 Weights 08/20/2020 05/31/2020 05/10/2020  Weight (lbs) 165 lb 11.2 oz 164 lb 12.8 oz 166 lb 9.6 oz  Weight (kg) 75.161 kg 74.753 kg 75.569 kg     Body mass index is 29.35 kg/m.  General:  Well nourished, well developed, in no acute distress HEENT: normal Lymph: no adenopathy Neck: no JVD Endocrine:  No thryomegaly Vascular: No carotid bruits; FA pulses 2+ bilaterally without bruits  Cardiac:  normal S1, S2; RRR; 3/6 systolic murmur  Lungs:  clear to auscultation bilaterally, no wheezing, rhonchi or rales  Abd: soft, nontender, no hepatomegaly  Ext: no edema Musculoskeletal:  No deformities, L knee immobilizer  Skin: warm and dry  Neuro:  CNs 2-12 intact, no focal abnormalities noted Psych:  Normal affect   EKG:  The EKG was personally reviewed and demonstrates: Normal sinus rhythm at rate of 82 bpm, repolarization abnormality, nonspecific ST/T wave changes in inferior leads Telemetry:  Telemetry was personally reviewed and demonstrates:  Sinus rhythm/PACs  Relevant CV Studies:  Echo 03/2019 1. Left ventricular ejection fraction, by estimation, is 65 to 70%. The  left ventricle has normal function. The left ventricle has no regional  wall motion abnormalities. There is mild concentric left ventricular  hypertrophy. Left ventricular diastolic  parameters are consistent with Grade I diastolic dysfunction (impaired  relaxation). Elevated left atrial pressure.   2. Right ventricular systolic function is normal. The right ventricular  size is normal. There is mildly elevated pulmonary artery systolic  pressure. The estimated right ventricular systolic pressure is 37.6 mmHg.   3. The mitral valve is normal in structure and function. Mild mitral  valve regurgitation. No evidence of mitral stenosis.   4. The aortic valve is  normal in structure and function. Aortic valve  regurgitation is moderate.  Moderate aortic valve stenosis. Aortic valve  mean gradient measures 28.0 mmHg.   5. The inferior vena cava is normal in size with greater than 50%  respiratory variability, suggesting right atrial pressure of 3 mmHg.   Laboratory Data:    Chemistry Recent Labs  Lab 08/20/20 0047 08/20/20 0400 08/22/20 0405  NA 137 135 134*  K 2.7* 3.4* 3.1*  CL 106 106 102  CO2 24 22 26   GLUCOSE 152* 136* 121*  BUN 9 8 13   CREATININE 0.85 0.83  0.70 0.88  CALCIUM 8.8* 9.0 9.4  GFRNONAA >60 >60  >60 >60  ANIONGAP 7 7 6      Hematology Recent Labs  Lab 08/20/20 0047 08/20/20 0400 08/22/20 0405  WBC 16.5* 17.8* 10.0  RBC 3.32* 3.68* 3.68*  HGB 10.7* 11.5* 11.7*  HCT 30.6* 33.3* 33.9*  MCV 92.2 90.5 92.1  MCH 32.2 31.3 31.8  MCHC 35.0 34.5 34.5  RDW 13.2 13.2 13.5  PLT 165 180 194    Radiology/Studies:  DG Chest 1 View  Result Date: 08/19/2020 CLINICAL DATA:  Syncope, fall yesterday. Right shoulder pain. Left hip pain. Knee pain. EXAM: CHEST  1 VIEW COMPARISON:  Radiograph 09/02/2015. Shoulder radiograph reported separately. FINDINGS: Stable heart size and mediastinal contours. Aortic atherosclerosis. Mitral annulus calcifications. No pneumothorax or focal airspace disease. No pleural effusion. Remote left rib fractures. No acute rib fractures are seen. IMPRESSION: 1. No acute finding. 2. Remote left rib fractures. Electronically Signed   By: Narda RutherfordMelanie  Sanford M.D.   On: 08/19/2020 18:51   DG Shoulder Right  Result Date: 08/19/2020 CLINICAL DATA:  Syncope, fall yesterday.  Right shoulder pain. EXAM: RIGHT SHOULDER - 2+ VIEW COMPARISON:  None. FINDINGS: No evidence of acute fracture or dislocation. There is advanced glenohumeral osteoarthritis with near complete joint space loss, osteophytes which are likely fragmented, and subchondral cystic change and sclerosis. 15 mm ossified intra-articular body. Soft tissue  calcifications adjacent to the lateral humeral head may represent ossified intra-articular bodies or enthesopathic. Subcortical cystic change in the lateral humeral head suggestive of rotator cuff arthropathy. There is mild to moderate acromioclavicular degenerative change. IMPRESSION: 1. No acute fracture or dislocation of the right shoulder. 2. Advanced glenohumeral osteoarthritis with at least one 15 mm ossified intra-articular body. Additional rounded calcifications adjacent to the lateral humeral head may be additional ossified bodies or within the rotator cuff tendons. 3. Subcortical cystic change in the lateral humeral head suggestive of rotator cuff arthropathy. Electronically Signed   By: Narda RutherfordMelanie  Sanford M.D.   On: 08/19/2020 18:52   CT Head Wo Contrast  Result Date: 08/19/2020 CLINICAL DATA:  Head trauma, mod-severe Fall yesterday. EXAM: CT HEAD WITHOUT CONTRAST TECHNIQUE: Contiguous axial images were obtained from the base of the skull through the vertex without intravenous contrast. COMPARISON:  Head CT 05/13/2015 FINDINGS: Brain: No intracranial hemorrhage, mass effect, or midline shift. Slight progression in generalized atrophy and chronic small vessel ischemia from 2017. No hydrocephalus. The basilar cisterns are patent. No evidence of territorial infarct or acute ischemia. No extra-axial or intracranial fluid collection. Vascular: Atherosclerosis of skullbase vasculature without hyperdense vessel or abnormal calcification. Skull: No fracture or focal lesion. Sinuses/Orbits: Paranasal sinuses and mastoid air cells are clear. The visualized orbits are unremarkable. Postsurgical change in both globes. Other: None. IMPRESSION: 1. No acute intracranial abnormality. No skull fracture. 2. Atrophy and chronic small vessel ischemia, with slight progression since 2017. Electronically Signed   By: Narda RutherfordMelanie  Sanford M.D.   On: 08/19/2020 18:47  CT Cervical Spine Wo Contrast  Result Date:  08/19/2020 CLINICAL DATA:  Neck trauma (Age >= 65y) neck paina fter neck trauma Fall yesterday EXAM: CT CERVICAL SPINE WITHOUT CONTRAST TECHNIQUE: Multidetector CT imaging of the cervical spine was performed without intravenous contrast. Multiplanar CT image reconstructions were also generated. COMPARISON:  Cervical radiographs 3107 FINDINGS: Alignment: No traumatic subluxation. Trace anterolisthesis of C3 on C4 appears facet mediated. Skull base and vertebrae: No acute fracture. Vertebral body heights are maintained. The dens and skull base are intact. Soft tissues and spinal canal: No prevertebral fluid or swelling. No visible canal hematoma. Disc levels: Disc space narrowing and endplate spurring at C6-C7. There is multilevel facet hypertrophy, most prominently affecting C3-C4 on the right and C5-C6 on the left. Upper chest: No acute or unexpected findings. Other: Carotid calcifications. IMPRESSION: Degenerative change in the cervical spine without acute fracture or subluxation. Electronically Signed   By: Narda Rutherford M.D.   On: 08/19/2020 18:49   DG Shoulder Left  Result Date: 08/22/2020 CLINICAL DATA:  Recent fall. LEFT shoulder pain. Limited range of motion in the LEFT shoulder. EXAM: LEFT SHOULDER - 2+ VIEW COMPARISON:  Chest x-ray on 08/19/2020 FINDINGS: There is deformity of the LEFT humeral head, associated multiple osteophytes and subacromial narrowing. There is no acute fracture or evidence for dislocation. Remote LEFT rib fractures are noted. IMPRESSION: No acute fracture or dislocation.  Chronic LEFT shoulder changes. Remote LEFT rib fractures. Electronically Signed   By: Norva Pavlov M.D.   On: 08/22/2020 16:19   DG Knee Complete 4 Views Left  Result Date: 08/19/2020 CLINICAL DATA:  Syncope, fall, left knee pain. EXAM: LEFT KNEE - COMPLETE 4+ VIEW COMPARISON:  Radiograph 01/23/2012 FINDINGS: Left knee arthroplasty in place. Acute fracture adjacent to the lateral femoral condyle which is  likely periprosthetic. Intact tibial component. There has been patellar resurfacing. Moderate knee joint effusion. The bones are diffusely under mineralized. IMPRESSION: 1. Left knee arthroplasty in place. Acute fracture of the lateral femoral condyle, likely periprosthetic. 2. Moderate knee joint effusion. Electronically Signed   By: Narda Rutherford M.D.   On: 08/19/2020 18:55   ECHOCARDIOGRAM COMPLETE  Result Date: 08/20/2020    ECHOCARDIOGRAM REPORT   Patient Name:   AKEELA BUSK Date of Exam: 08/20/2020 Medical Rec #:  [id number]       Height:       63.0 in Accession #:    [id number]      Weight:       165.7 lb Date of Birth:  March 20, 1934       BSA:          1.785 m Patient Age:    86 years        BP:           248/59 mmHg Patient Gender: F               HR:           79 bpm. Exam Location:  Inpatient Procedure: 2D Echo, 3D Echo, Cardiac Doppler, Color Doppler and Strain Analysis Indications:    I35.0 Nonrheumatic aortic (valve) stenosis  History:        Patient has prior history of Echocardiogram examinations, most                 recent 04/08/2019. Aortic Valve Disease, Signs/Symptoms:Syncope,                 Fever and Chest Pain; Risk Factors:Hypertension and  Dyslipidemia. Edema. Aortic stenosis.  Sonographer:    Sheralyn Boatman RDCS Referring Phys: [id number] Carlton Adam  Sonographer Comments: Global longitudinal strain was attempted. Patient supine. Could not turn due to broken leg. IMPRESSIONS  1. The aortic valve is calcified. There is severe calcifcation of the aortic valve. There is severe thickening of the aortic valve. Aortic valve regurgitation is mild. Severe aortic valve stenosis. AVA 0.9cm2, mean gradient , peak gradient ,  Vmax 4.64m/s  2. Left ventricular ejection fraction, by estimation, is 65 to 70%. Left ventricular ejection fraction by 3D volume is 71 %. The left ventricle has normal function. The left ventricle has no regional wall motion abnormalities. There is  moderate asymmetric hypertrophy of the basal-septum. The rest of the LV segments demonstrate mild left ventricular hypertrophy. Left ventricular diastolic parameters are consistent with Grade I diastolic dysfunction (impaired relaxation). Elevated left atrial pressure. The average left ventricular global longitudinal strain is -20.2 %. The global longitudinal strain is normal.  3. Right ventricular systolic function is normal. The right ventricular size is normal. There is severely elevated pulmonary artery systolic pressure. The estimated right ventricular systolic pressure is 86.2 mmHg.  4. The mitral valve is abnormal. There is moderate thickening of the mitral valve leaflet(s). There is moderate calcification of the mitral valve leaflet(s). Moderate to severe mitral annular calcification. Trivial mitral valve regurgitation. Mild calcific mitral stenosis with MVA 1.5cm2 by continuity, mean gradient at HR 74bpm.  5. The inferior vena cava is dilated in size with >50% respiratory variability, suggesting right atrial pressure of 8 mmHg. Comparison(s): Compared to prior TTE in 03/2019, the aortic stenosis is now severe (previously moderate) and there is severe pulmonary hypertension with PASP . FINDINGS  Left Ventricle: Left ventricular ejection fraction, by estimation, is 65 to 70%. Left ventricular ejection fraction by 3D volume is 71 %. The left ventricle has normal function. The left ventricle has no regional wall motion abnormalities. The average left ventricular global longitudinal strain is -20.2 %. The global longitudinal strain is normal. The left ventricular internal cavity size was normal in size. There is moderate asymmetric hypertrophy of the basal-septum. The rest of the LV segments demonstrate mild left ventricular hypertrophy. Left ventricular diastolic parameters are consistent with Grade I diastolic dysfunction (impaired relaxation). Elevated left atrial pressure. Right Ventricle: The  right ventricular size is normal. No increase in right ventricular wall thickness. Right ventricular systolic function is normal. There is severely elevated pulmonary artery systolic pressure. The tricuspid regurgitant velocity is 4.22 m/s, and with an assumed right atrial pressure of 15 mmHg, the estimated right ventricular systolic pressure is 86.2 mmHg. Left Atrium: Left atrial size was normal in size. Right Atrium: Right atrial size was normal in size. Pericardium: There is no evidence of pericardial effusion. Mitral Valve: The mitral valve is abnormal. There is moderate thickening of the mitral valve leaflet(s). There is moderate calcification of the mitral valve leaflet(s). Moderate to severe mitral annular calcification. Trivial mitral valve regurgitation. MV peak gradient, 14.8 mmHg. The mean mitral valve gradient is 4.0 mmHg. Mild calcific mitral stenosis with MVA 1.5cm2 by continuity, mean gradient at HR 74bpm. Tricuspid Valve: The tricuspid valve is normal in structure. Tricuspid valve regurgitation is mild. Aortic Valve: There is severe calcifcation of the aortic valve. There is severe thickening of the aortic valve. Aortic valve regurgitation is mild. Aortic regurgitation PHT measures 426 msec. Severe aortic stenosis is present. AVA 0.9cm2, mean gradient , peak gradient , Vmax 4.36m/s. Pulmonic  Valve: The pulmonic valve was not well visualized. Pulmonic valve regurgitation is trivial. Aorta: The aortic root and ascending aorta are structurally normal, with no evidence of dilitation. Venous: The inferior vena cava is dilated in size with greater than 50% respiratory variability, suggesting right atrial pressure of 8 mmHg. IAS/Shunts: The interatrial septum is aneurysmal. No atrial level shunt detected by color flow Doppler.  LEFT VENTRICLE PLAX 2D LVIDd:         2.90 cm         Diastology LVIDs:         1.70 cm         LV e' medial:    5.22 cm/s LV PW:         1.50 cm         LV E/e'  medial:  22.5 LV IVS:        1.40 cm         LV e' lateral:   7.51 cm/s LVOT diam:     1.80 cm         LV E/e' lateral: 15.6 LV SV:         68 LV SV Index:   38              2D LVOT Area:     2.54 cm        Longitudinal                                Strain                                2D Strain GLS  -20.2 % LV Volumes (MOD)               Avg: LV vol d, MOD    57.6 ml A2C:                           3D Volume EF LV vol d, MOD    48.3 ml       LV 3D EF:    Left A4C:                                        ventricular LV vol s, MOD    17.3 ml                    ejection A2C:                                        fraction by LV vol s, MOD    11.3 ml                    3D volume A4C:                                        is 71 %. LV SV MOD A2C:   40.3 ml LV SV MOD A4C:   48.3 ml LV SV MOD BP:    39.0 ml  3D Volume EF:                                3D EF:        71 % RIGHT VENTRICLE             IVC RV S prime:     11.90 cm/s  IVC diam: 2.00 cm TAPSE (M-mode): 2.2 cm LEFT ATRIUM             Index       RIGHT ATRIUM           Index LA diam:        3.70 cm 2.07 cm/m  RA Area:     14.40 cm LA Vol (A2C):   40.8 ml 22.86 ml/m RA Volume:   35.60 ml  19.94 ml/m LA Vol (A4C):   32.2 ml 18.04 ml/m LA Biplane Vol: 37.8 ml 21.18 ml/m  AORTIC VALVE AV Area (Vmax):    0.94 cm AV Area (Vmean):   0.92 cm AV Area (VTI):     0.92 cm AV Vmax:           381.00 cm/s AV Vmean:          265.333 cm/s AV VTI:            0.743 m AV Peak Grad:      58.1 mmHg AV Mean Grad:      33.0 mmHg LVOT Vmax:         140.00 cm/s LVOT Vmean:        95.900 cm/s LVOT VTI:          0.269 m LVOT/AV VTI ratio: 0.36 AI PHT:            426 msec  AORTA Ao Root diam: 2.80 cm Ao Asc diam:  3.10 cm MITRAL VALVE                TRICUSPID VALVE MV Area (PHT): 2.79 cm     TR Peak grad:   71.2 mmHg MV Area VTI:   1.38 cm     TR Vmax:        422.00 cm/s MV Peak grad:  14.8 mmHg MV Mean grad:  4.0 mmHg     SHUNTS MV Vmax:       1.92 m/s     Systemic VTI:   0.27 m MV Vmean:      90.3 cm/s    Systemic Diam: 1.80 cm MV Decel Time: 272 msec MV E velocity: 117.50 cm/s MV A velocity: 175.00 cm/s MV E/A ratio:  0.67 Laurance Flatten MD Electronically signed by Laurance Flatten MD Signature Date/Time: 08/20/2020/2:56:15 PM    Final    DG Hip Unilat W or Wo Pelvis 2-3 Views Left  Result Date: 08/19/2020 CLINICAL DATA:  Syncope, fall.  Left hip pain. EXAM: DG HIP (WITH OR WITHOUT PELVIS) 2-3V LEFT COMPARISON:  None. FINDINGS: No acute fracture or dislocation. Advanced left hip osteoarthritis with near complete joint space loss, subchondral cystic change and sclerosis, and peripheral osteophytes. Pubic rami are intact. The bones are diffusely under mineralized. There is moderate right hip osteoarthritis. Pubic symphysis and sacroiliac joints are congruent. IMPRESSION: 1. No acute fracture or dislocation of the left hip. 2. Advanced left hip osteoarthritis. Electronically Signed   By: Narda Rutherford M.D.   On: 08/19/2020 18:53     Assessment and Plan:   Syncope - No prodrome  symptoms. -Unknown etiology but suspect secondary to aortic stenosis. -Pending MRI of brain today  2.  Aortic stenosis -At baseline patient is very independent and active.  However had a sedentary lifestyle in past 2 months as he was living in motel secondary to housing issue.  She went home and tried to go up on stairs and had syncope episode. -Echocardiogram showing normal LV function with severe aortic stenosis (AVA 0.9cm2, mean  gradient , peak gradient ,  Vmax 4.7m/s). This has worsen from past.  -Patient will need right and left cardiac catheterization  -Inpatient versus outpatient work-up per MD  3.  Left femoral condyle fracture -On knee immobilizer -PT recommended SNF  4.  Hypokalemia -Potassium was 2.6 on admit, 3.1 today  - Supplement given   5. HTN - BP minimally elevated  - Per primary team   For questions or updates, please contact [facility] Please consult [email or web address] for contact info under    Lorelei Pont, PA  08/23/2020 10:40 AM    Attending Note:   The patient was seen and examined.  Agree with assessment and plan as noted above.  Changes made to the above note as needed.  Patient seen and independently examined with Chelsea Aus, PA .   We discussed all aspects of the encounter. I agree with the assessment and plan as stated above.     Syncope:   likely due to her AS.  Her AV gradient has worsened since her previous echo.  [facility]  R and L heart cath tomorrow  Will have the TAVR team see her  2.  CVA;   she apparently has had a small occipital CVA.   Will need further evaluation     I have spent a total of 40 minutes with patient reviewing hospital  notes , telemetry, EKGs, labs and examining patient as well as establishing an assessment and plan that was discussed with the patient.  > 50% of time was spent in direct patient care.    Vesta Mixer, Montez Hageman., MD, [facility] 08/23/2020, 1:54 PM [address]  [address] Office [phone] Pager [phone]

## 2020-08-23 NOTE — Progress Notes (Signed)
Carotid Duplex has been completed.  Results can be found under chart review under CV PROC. 08/23/2020 4:47 PM Jameyah Fennewald RVT, RDMS

## 2020-08-23 NOTE — Progress Notes (Signed)
Progress Note    Terri King   ZOX:[id number]  DOB: 1935/01/21  DOA: 08/19/2020     4  PCP: Tresa Garter, MD  CC: passed out at home; fall  Hospital Course: Terri King is a 86 y.o. female with medical history significant for HTN, moderate AS (per 03/2019 echo), CAD, hx of MI, CAD, OA who presented after being found on the floor at home by family.  She reported she passed out and laid on the floor overnight. She does not remember what made her pass out and states she does not remember being dizzy or having chest pain or palpitations prior to her passing out.   She has had no chest pain, shortness of breath (although does endorse DOE which has been long standing), cough, fever, nausea, vomiting, diarrhea since she woke up on the floor.   She states she has not had any numbness or weakness of her extremities and has had no visual change or slurred speech (however when questioned further about vision changes again on 7/5, she did admit to some blurry vision recently but could not elaborate further).   After the fall, she was unable to stand up or ambulate on her left leg.  States she has never had an episode like this in the past.  She reports she has never had seizures. She denies tobacco, alcohol, illicit drug use.  She underwent multiple imaging studies regarding her fall.  X-rays revealed a left lateral condyle fracture of the femur.  She was evaluated by orthopedic surgery and recommended for a knee immobilizer, touchdown weightbearing, and to follow-up outpatient with [facility] in approximately 2 weeks.  Further work-up was commenced regarding her syncope, falling, lethargy/weakness, and confusion as per family.  She was found to be on multiple over sedating medications including temazepam, gabapentin, Seroquel.  Her temazepam was decreased down to 15 mg nightly with the goal of further weaning off if able.  Gabapentin dose was also reduced as she was on max  dose for her renal function.  She was also on Seroquel twice daily for years due to severe difficulty with sleep at night (her dose was modified and continued only nightly).  Repeat echo was also ordered and revealed progression of her aortic stenosis, now graded as severe.  MRI brain also obtained which showed a 5 mm acute cortical infarct within the left occipital lobe and tiny chronic lacunar infarcts within the left cerebellar hemisphere (after findings discussed with her sons bedside, they did endorse that she has had difficulty with balance as well which they attributed to her history of knee problems).  She was evaluated by cardiology at [facility] and recommended for transfer to [facility] for further TAVR workup and will also need neurology evaluation regarding her acute and old CVAs.   Interval History:  Seen this morning with her son bedside.  We discussed some of her polypharmacy and that we would adjust medications, patient and family were also amenable to this. As the day progressed, further results were noted including severe AS on her echo as well as acute CVA on MRI brain. In consultation with cardiology, patient will transfer to [facility] for further work-up and will also need neurology consult.  ROS: Constitutional: negative for chills and fevers, Respiratory: negative for cough, Cardiovascular: negative for chest pain, and Gastrointestinal: negative for abdominal pain  Assessment & Plan:  Altered mental status - resolved  -At this point, may be multifactorial in setting of polypharmacy (temazepam, gabapentin,  Seroquel), recent CVA - meds adjusted; would continue to wean/reduce as able; have decreased temazepam from 30 mg qhs down to 15 mg qhs. Reduced gaba from 300 mg TID to 100 mg TID, and reduced seroquel from BID to 25 mg qhs   Fracture of the femoral condyle, closed Status post orthopedics consultation recommended knee immobilizer and follow-up in the office in 2 weeks.  Severe AS -  progression from 03/2019 echo, now with severe AS; given syncopal episode prior to admission, will ask cardiology to also evaluate  - plan is transfer to [facility] for further workup   Acute CVA - acute 5 mm left occipital infarct seen on MRI brain; also chronic infarcts in left cerebellum; less likely to be cardioembolic given unilateral distribution but more workup needed still - since getting heart cath on 7/6 tentatively will hold off on CTA head/neck and order carotid u/s for now to evaluate - transfer to [facility]; Dr. Pearlean Brownie aware of transfer - check TSH, A1c, Lipid - further antiplatelet/anticoag regimen to be determined upon further workup and eval by neurology as well   Essential hypertension Continue Norvasc and Maxide   Hypokalemia Replete and trend  Leukocytosis - likely reactive on admission; no bands noted - has resolved   Old records reviewed in assessment of this patient  Antimicrobials:   DVT prophylaxis: enoxaparin (LOVENOX) injection 40 mg Start: 08/20/20 0000   Code Status:   Code Status: Full Code Family Communication: Son  Disposition Plan: Status is: Inpatient  Remains inpatient appropriate because:Ongoing diagnostic testing needed not appropriate for outpatient work up and Inpatient level of care appropriate due to severity of illness  Dispo: The patient is from: Home              Anticipated d/c is to: SNF              Patient currently is not medically stable to d/c.   Difficult to place patient No  Risk of unplanned readmission score: Unplanned Admission- Pilot do not use: 13.15   Objective: Blood pressure (!) 146/53, pulse 72, temperature 98.6 F (37 C), resp. rate 20, height 5\' 3"  (1.6 m), weight 75.2 kg, SpO2 99 %.  Examination: General appearance: alert, cooperative, and no distress Head: Normocephalic, without obvious abnormality, atraumatic Eyes:  EOMI Lungs: clear to auscultation bilaterally Heart: regular rate and rhythm, S1, S2 normal, and 3/6  HSM heard throughout precordium Abdomen: normal findings: bowel sounds normal and soft, non-tender Extremities:  left knee noted in immobilizer Skin: mobility and turgor normal Neurologic: strength in LLE limited by knee pain but 5/5 in remainder extremities. No obvious paresthesias reported during testing   Consultants:  Cardiology Neurology  Procedures:    Data Reviewed: I have personally reviewed following labs and imaging studies No results found for this or any previous visit (from the past 24 hour(s)).  Recent Results (from the past 240 hour(s))  SARS CORONAVIRUS 2 (TAT 6-24 HRS) Nasopharyngeal Nasopharyngeal Swab     Status: None   Collection Time: 08/20/20  6:31 AM   Specimen: Nasopharyngeal Swab  Result Value Ref Range Status   SARS Coronavirus 2 NEGATIVE NEGATIVE Final    Comment: (NOTE) SARS-CoV-2 target nucleic acids are NOT DETECTED.  The SARS-CoV-2 RNA is generally detectable in upper and lower respiratory specimens during the acute phase of infection. Negative results do not preclude SARS-CoV-2 infection, do not rule out co-infections with other pathogens, and should not be used as the sole basis for treatment or other  patient management decisions. Negative results must be combined with clinical observations, patient history, and epidemiological information. The expected result is Negative.  Fact Sheet for Patients: HairSlick.nohttps://www.fda.gov/media/138098/download  Fact Sheet for Healthcare Providers: quierodirigir.comhttps://www.fda.gov/media/138095/download  This test is not yet approved or cleared by the [place] FDA and  has been authorized for detection and/or diagnosis of SARS-CoV-2 by FDA under an Emergency Use Authorization (EUA). This EUA will remain  in effect (meaning this test can be used) for the duration of the COVID-19 declaration under Se ction 564(b)(1) of the Act, 21 U.S.C. section 360bbb-3(b)(1), unless the authorization is terminated or revoked  sooner.  Performed at [facility], [address] [place], [place] [address]      Radiology Studies: MR BRAIN W WO CONTRAST  Result Date: 08/23/2020 CLINICAL DATA:  Neuro deficit, acute, stroke suspected. Additional history provided: Evaluate for possible CVA, recent fall, found down, patient with continued slow Nissen weakness on left side. EXAM: MRI HEAD WITHOUT AND WITH CONTRAST TECHNIQUE: Multiplanar, multiecho pulse sequences of the brain and surrounding structures were obtained without and with intravenous contrast. CONTRAST:  7mL GADAVIST GADOBUTROL 1 MMOL/ML IV SOLN COMPARISON:  Prior head CT examinations 08/19/2020 and earlier. FINDINGS: Brain: The examination is intermittently motion degraded, limiting evaluation. Most notably, there is moderate/severe motion degradation of the coronal T2 weighted sequence, moderate/severe motion degradation of the axial T1 weighted postcontrast sequence, severe motion degradation of the coronal T1 weighted postcontrast sequence and moderate motion degradation of the sagittal T1 weighted postcontrast sequence. Moderate generalized cerebral and cerebellar atrophy. 5 mm focus of restricted diffusion within the left occipital lobe compatible with acute cortical infarct (series 5, image 72). Additional subtle acute cortical infarction changes are questioned more laterally within the left occipital lobe (versus artifact) (for instance as seen on series 5, image 73). Moderate multifocal T2/FLAIR hyperintensity within the cerebral white matter, nonspecific but compatible with chronic small vessel ischemic disease. Tiny chronic lacunar infarcts within the left cerebellar hemisphere. No evidence of an intracranial mass. No chronic intracranial blood products are identified. No extra-axial fluid collection. No midline shift. Within described limitations, no pathologic intracranial enhancement is identified. Vascular: Expected proximal arterial flow voids. Skull and  upper cervical spine: No focal marrow lesion. Incompletely assessed cervical spondylosis. Sinuses/Orbits: Visualized orbits show no acute finding. No significant paranasal sinus disease. IMPRESSION: Motion degraded examination, as described and limiting evaluation. 5 mm acute cortical infarct within the left occipital lobe. Additional subtle acute cortical infarction changes are questioned more laterally within the left occipital lobe (versus artifact). Moderate cerebral white matter chronic small vessel ischemic disease. Tiny chronic lacunar infarcts within the left cerebellar hemisphere. Moderate generalized parenchymal atrophy. Electronically Signed   By: Jackey LogeKyle  Golden DO   On: 08/23/2020 13:10   DG Shoulder Left  Result Date: 08/22/2020 CLINICAL DATA:  Recent fall. LEFT shoulder pain. Limited range of motion in the LEFT shoulder. EXAM: LEFT SHOULDER - 2+ VIEW COMPARISON:  Chest x-ray on 08/19/2020 FINDINGS: There is deformity of the LEFT humeral head, associated multiple osteophytes and subacromial narrowing. There is no acute fracture or evidence for dislocation. Remote LEFT rib fractures are noted. IMPRESSION: No acute fracture or dislocation.  Chronic LEFT shoulder changes. Remote LEFT rib fractures. Electronically Signed   By: Norva PavlovElizabeth  Brown M.D.   On: 08/22/2020 16:19   MR BRAIN W WO CONTRAST  Final Result    DG Shoulder Left  Final Result    DG Chest 1 View  Final Result  DG Shoulder Right  Final Result    DG Hip Unilat W or Wo Pelvis 2-3 Views Left  Final Result    DG Knee Complete 4 Views Left  Final Result    CT Head Wo Contrast  Final Result    CT Cervical Spine Wo Contrast  Final Result    VAS US CAROTID    (Results Pending)    Scheduled Meds:  amLODipine  5 mg Oral Daily   enoxaparin (LOVENOX) injection  40 mg Subcutaneous Q24H   escitalopram  10 mg Oral Daily   famotidine  40 mg Oral Daily   gabapentin  100 mg Oral TID   metoprolol succinate  50 mg Oral Daily    potassium chloride SA  40 mEq Oral TID   QUEtiapine  25 mg Oral QHS   sodium chloride flush  3 mL Intravenous Q12H   temazepam  15 mg Oral QHS   triamterene-hydrochlorothiazide  1 tablet Oral Daily   PRN Meds: ibuprofen, nitroGLYCERIN, senna-docusate Continuous Infusions:   LOS: 4 days  Time spent: Greater than 50% of the 35 minute visit was spent in counseling/coordination of care for the patient as laid out in the A&P.   Lewie Chamber, MD [facility] 08/23/2020, 2:20 PM

## 2020-08-24 ENCOUNTER — Encounter (HOSPITAL_COMMUNITY)
Admission: EM | Disposition: A | Discharge status: Skilled Nursing Facility | Payer: Self-pay | Source: Home / Self Care | Attending: Internal Medicine

## 2020-08-24 DIAGNOSIS — I639 Cerebral infarction, unspecified: Secondary | ICD-10-CM | POA: Diagnosis not present

## 2020-08-24 DIAGNOSIS — R55 Syncope and collapse: Secondary | ICD-10-CM | POA: Diagnosis not present

## 2020-08-24 DIAGNOSIS — I350 Nonrheumatic aortic (valve) stenosis: Secondary | ICD-10-CM | POA: Diagnosis not present

## 2020-08-24 HISTORY — PX: RIGHT HEART CATH AND CORONARY ANGIOGRAPHY: CATH118264

## 2020-08-24 LAB — POCT I-STAT EG7
Acid-Base Excess: 1 mmol/L (ref 0.0–2.0)
Acid-Base Excess: 1 mmol/L (ref 0.0–2.0)
Bicarbonate: 22.1 mmol/L (ref 20.0–28.0)
Bicarbonate: 22.8 mmol/L (ref 20.0–28.0)
Calcium, Ion: 1.22 mmol/L (ref 1.15–1.40)
Calcium, Ion: 1.28 mmol/L (ref 1.15–1.40)
HCT: 38 % (ref 36.0–46.0)
HCT: 38 % (ref 36.0–46.0)
Hemoglobin: 12.9 g/dL (ref 12.0–15.0)
Hemoglobin: 12.9 g/dL (ref 12.0–15.0)
O2 Saturation: 75 %
O2 Saturation: 75 %
Potassium: 3.9 mmol/L (ref 3.5–5.1)
Potassium: 4 mmol/L (ref 3.5–5.1)
Sodium: 136 mmol/L (ref 135–145)
Sodium: 137 mmol/L (ref 135–145)
TCO2: 23 mmol/L (ref 22–32)
TCO2: 24 mmol/L (ref 22–32)
pCO2, Ven: 26.1 mmHg — ABNORMAL LOW (ref 44.0–60.0)
pCO2, Ven: 26.4 mmHg — ABNORMAL LOW (ref 44.0–60.0)
pH, Ven: 7.537 — ABNORMAL HIGH (ref 7.250–7.430)
pH, Ven: 7.545 — ABNORMAL HIGH (ref 7.250–7.430)
pO2, Ven: 34 mmHg (ref 32.0–45.0)
pO2, Ven: 34 mmHg (ref 32.0–45.0)

## 2020-08-24 LAB — CBC WITH DIFFERENTIAL/PLATELET
Abs Immature Granulocytes: 0.11 10*3/uL — ABNORMAL HIGH (ref 0.00–0.07)
Basophils Absolute: 0.1 10*3/uL (ref 0.0–0.1)
Basophils Relative: 1 %
Eosinophils Absolute: 0.4 10*3/uL (ref 0.0–0.5)
Eosinophils Relative: 5 %
HCT: 36.6 % (ref 36.0–46.0)
Hemoglobin: 12.4 g/dL (ref 12.0–15.0)
Immature Granulocytes: 1 %
Lymphocytes Relative: 20 %
Lymphs Abs: 1.9 10*3/uL (ref 0.7–4.0)
MCH: 31.5 pg (ref 26.0–34.0)
MCHC: 33.9 g/dL (ref 30.0–36.0)
MCV: 92.9 fL (ref 80.0–100.0)
Monocytes Absolute: 1.6 10*3/uL — ABNORMAL HIGH (ref 0.1–1.0)
Monocytes Relative: 17 %
Neutro Abs: 5.3 10*3/uL (ref 1.7–7.7)
Neutrophils Relative %: 56 %
Platelets: 228 10*3/uL (ref 150–400)
RBC: 3.94 MIL/uL (ref 3.87–5.11)
RDW: 13.4 % (ref 11.5–15.5)
WBC: 9.4 10*3/uL (ref 4.0–10.5)
nRBC: 0 % (ref 0.0–0.2)

## 2020-08-24 LAB — CREATININE, SERUM
Creatinine, Ser: 0.89 mg/dL (ref 0.44–1.00)
GFR, Estimated: >60 mL/min (ref >60)

## 2020-08-24 LAB — POCT I-STAT 7, (LYTES, BLD GAS, ICA,H+H)
Acid-Base Excess: 1 mmol/L (ref 0.0–2.0)
Bicarbonate: 22.7 mmol/L (ref 20.0–28.0)
Calcium, Ion: 1.28 mmol/L (ref 1.15–1.40)
HCT: 38 % (ref 36.0–46.0)
Hemoglobin: 12.9 g/dL (ref 12.0–15.0)
O2 Saturation: 100 %
Potassium: 4.2 mmol/L (ref 3.5–5.1)
Sodium: 136 mmol/L (ref 135–145)
TCO2: 24 mmol/L (ref 22–32)
pCO2 arterial: 28.5 mmHg — ABNORMAL LOW (ref 32.0–48.0)
pH, Arterial: 7.509 — ABNORMAL HIGH (ref 7.350–7.450)
pO2, Arterial: 147 mmHg — ABNORMAL HIGH (ref 83.0–108.0)

## 2020-08-24 LAB — CBC
HCT: 59.8 % — ABNORMAL HIGH (ref 36.0–46.0)
Hemoglobin: 20.6 g/dL — ABNORMAL HIGH (ref 12.0–15.0)
MCH: 31.3 pg (ref 26.0–34.0)
MCHC: 34.4 g/dL (ref 30.0–36.0)
MCV: 90.9 fL (ref 80.0–100.0)
Platelets: 139 10*3/uL — ABNORMAL LOW (ref 150–400)
RBC: 6.58 MIL/uL — ABNORMAL HIGH (ref 3.87–5.11)
RDW: 13.2 % (ref 11.5–15.5)
WBC: 5.8 10*3/uL (ref 4.0–10.5)
nRBC: 0 % (ref 0.0–0.2)

## 2020-08-24 LAB — BASIC METABOLIC PANEL
Anion gap: 8 (ref 5–15)
BUN: 15 mg/dL (ref 8–23)
CO2: 25 mmol/L (ref 22–32)
Calcium: 9.8 mg/dL (ref 8.9–10.3)
Chloride: 107 mmol/L (ref 98–111)
Creatinine, Ser: 0.86 mg/dL (ref 0.44–1.00)
GFR, Estimated: >60 mL/min (ref >60)
Glucose, Bld: 119 mg/dL — ABNORMAL HIGH (ref 70–99)
Potassium: 4.5 mmol/L (ref 3.5–5.1)
Sodium: 140 mmol/L (ref 135–145)

## 2020-08-24 LAB — MAGNESIUM: Magnesium: 1.8 mg/dL (ref 1.7–2.4)

## 2020-08-24 SURGERY — RIGHT HEART CATH AND CORONARY ANGIOGRAPHY
Anesthesia: LOCAL

## 2020-08-24 MED ORDER — LIDOCAINE HCL (PF) 1 % IJ SOLN
INTRAMUSCULAR | Status: DC | PRN
  Administered 2020-08-24: 4 mL
  Administered 2020-08-24: 2 mL
  Administered 2020-08-24: 4 mL
  Administered 2020-08-24: 10 mL

## 2020-08-24 MED ORDER — SODIUM CHLORIDE 0.9 % IV SOLN: INTRAVENOUS | Status: AC

## 2020-08-24 MED ORDER — HEPARIN SODIUM (PORCINE) 1000 UNIT/ML IJ SOLN
INTRAMUSCULAR | Status: DC | PRN
  Administered 2020-08-24: 3500 [IU] via INTRAVENOUS

## 2020-08-24 MED ORDER — SODIUM CHLORIDE 0.9% FLUSH: 3.0000 mL | INTRAVENOUS | Status: DC | PRN

## 2020-08-24 MED ORDER — HYDRALAZINE HCL 20 MG/ML IJ SOLN
INTRAMUSCULAR | Status: DC | PRN
  Administered 2020-08-24: 10 mg via INTRAVENOUS

## 2020-08-24 MED ORDER — FENTANYL CITRATE (PF) 100 MCG/2ML IJ SOLN
INTRAMUSCULAR | Status: AC
  Filled 2020-08-24: qty 2

## 2020-08-24 MED ORDER — VERAPAMIL HCL 2.5 MG/ML IV SOLN
INTRAVENOUS | Status: AC
  Filled 2020-08-24: qty 2

## 2020-08-24 MED ORDER — HYDRALAZINE HCL 20 MG/ML IJ SOLN: 10.0000 mg | INTRAMUSCULAR | Status: AC | PRN

## 2020-08-24 MED ORDER — LABETALOL HCL 5 MG/ML IV SOLN
INTRAVENOUS | Status: AC
  Filled 2020-08-24: qty 4

## 2020-08-24 MED ORDER — FENTANYL CITRATE (PF) 100 MCG/2ML IJ SOLN
INTRAMUSCULAR | Status: DC | PRN
  Administered 2020-08-24: 25 ug via INTRAVENOUS

## 2020-08-24 MED ORDER — DIPHENHYDRAMINE HCL 50 MG/ML IJ SOLN
50.0000 mg | Freq: Once | INTRAMUSCULAR | Status: AC
  Administered 2020-08-24: 50 mg via INTRAVENOUS
  Filled 2020-08-24: qty 1

## 2020-08-24 MED ORDER — HEPARIN SODIUM (PORCINE) 1000 UNIT/ML IJ SOLN
INTRAMUSCULAR | Status: AC
  Filled 2020-08-24: qty 1

## 2020-08-24 MED ORDER — ASPIRIN EC 81 MG PO TBEC
81.0000 mg | DELAYED_RELEASE_TABLET | Freq: Every day | ORAL | Status: DC
  Administered 2020-08-24 – 2020-09-02 (×10): 81 mg via ORAL
  Filled 2020-08-24 (×10): qty 1

## 2020-08-24 MED ORDER — ENOXAPARIN SODIUM 30 MG/0.3ML IJ SOSY
30.0000 mg | PREFILLED_SYRINGE | INTRAMUSCULAR | Status: DC
  Administered 2020-08-25 – 2020-08-29 (×5): 30 mg via SUBCUTANEOUS
  Filled 2020-08-24 (×5): qty 0.3

## 2020-08-24 MED ORDER — HYDRALAZINE HCL 20 MG/ML IJ SOLN
INTRAMUSCULAR | Status: AC
  Filled 2020-08-24: qty 1

## 2020-08-24 MED ORDER — FENTANYL CITRATE (PF) 100 MCG/2ML IJ SOLN
50.0000 ug | Freq: Once | INTRAMUSCULAR | Status: AC
  Administered 2020-08-24: 50 ug via INTRAVENOUS

## 2020-08-24 MED ORDER — HEPARIN (PORCINE) IN NACL 1000-0.9 UT/500ML-% IV SOLN
INTRAVENOUS | Status: DC | PRN
  Administered 2020-08-24 (×2): 500 mL

## 2020-08-24 MED ORDER — METHYLPREDNISOLONE SODIUM SUCC 125 MG IJ SOLR
125.0000 mg | Freq: Once | INTRAMUSCULAR | Status: AC
  Administered 2020-08-24: 125 mg via INTRAVENOUS
  Filled 2020-08-24: qty 2

## 2020-08-24 MED ORDER — SODIUM CHLORIDE 0.9% FLUSH
3.0000 mL | Freq: Two times a day (BID) | INTRAVENOUS | Status: DC
  Administered 2020-08-24 – 2020-08-30 (×8): 3 mL via INTRAVENOUS

## 2020-08-24 MED ORDER — ASPIRIN EC 81 MG PO TBEC: 81.0000 mg | DELAYED_RELEASE_TABLET | Freq: Every day | ORAL | Status: DC

## 2020-08-24 MED ORDER — SODIUM CHLORIDE 0.9 % IV SOLN: 250.0000 mL | INTRAVENOUS | Status: DC | PRN

## 2020-08-24 MED ORDER — ONDANSETRON HCL 4 MG/2ML IJ SOLN: 4.0000 mg | Freq: Four times a day (QID) | INTRAMUSCULAR | Status: DC | PRN

## 2020-08-24 MED ORDER — LIDOCAINE HCL (PF) 1 % IJ SOLN
INTRAMUSCULAR | Status: AC
  Filled 2020-08-24: qty 30

## 2020-08-24 MED ORDER — LABETALOL HCL 5 MG/ML IV SOLN
10.0000 mg | INTRAVENOUS | Status: AC | PRN
  Administered 2020-08-24: 10 mg via INTRAVENOUS

## 2020-08-24 MED ORDER — IOHEXOL 350 MG/ML SOLN
INTRAVENOUS | Status: DC | PRN
  Administered 2020-08-24: 30 mL

## 2020-08-24 MED ORDER — VERAPAMIL HCL 2.5 MG/ML IV SOLN
INTRAVENOUS | Status: DC | PRN
  Administered 2020-08-24: 10 mL via INTRA_ARTERIAL

## 2020-08-24 MED ORDER — HEPARIN (PORCINE) IN NACL 1000-0.9 UT/500ML-% IV SOLN
INTRAVENOUS | Status: AC
  Filled 2020-08-24: qty 1000

## 2020-08-24 SURGICAL SUPPLY — 17 items
CATH BALLN WEDGE 5F 110CM (CATHETERS) ×1 IMPLANT
CATH OPTITORQUE TIG 4.0 5F (CATHETERS) ×1 IMPLANT
CATH SWAN GANZ 7F STRAIGHT (CATHETERS) ×1 IMPLANT
DEVICE RAD COMP TR BAND LRG (VASCULAR PRODUCTS) ×1 IMPLANT
GLIDESHEATH SLEND SS 6F .021 (SHEATH) ×1 IMPLANT
GUIDEWIRE .025 260CM (WIRE) ×1 IMPLANT
KIT HEART LEFT (KITS) ×2 IMPLANT
KIT MICROPUNCTURE NIT STIFF (SHEATH) ×1 IMPLANT
PACK CARDIAC CATHETERIZATION (CUSTOM PROCEDURE TRAY) ×2 IMPLANT
SHEATH GLIDE SLENDER 4/5FR (SHEATH) ×2 IMPLANT
SHEATH PINNACLE 7F 10CM (SHEATH) ×1 IMPLANT
SHEATH PROBE COVER 6X72 (BAG) ×1 IMPLANT
TRANSDUCER W/STOPCOCK (MISCELLANEOUS) ×2 IMPLANT
TUBING CIL FLEX 10 FLL-RA (TUBING) ×2 IMPLANT
WIRE EMERALD ST .035X150CM (WIRE) ×1 IMPLANT
WIRE HI TORQ VERSACORE J 260CM (WIRE) ×1 IMPLANT
WIRE MICROINTRODUCER 60CM (WIRE) ×1 IMPLANT

## 2020-08-24 NOTE — Progress Notes (Addendum)
Progress Note  Patient Name: Terri King Date of Encounter: 08/24/2020  [facility] Cardiologist: Dietrich Pates, MD   Subjective   Feeling well. No chest pain, sob or palpitations.    Inpatient Medications    Scheduled Meds:  amLODipine  5 mg Oral Daily   enoxaparin (LOVENOX) injection  40 mg Subcutaneous Q24H   escitalopram  10 mg Oral Daily   famotidine  40 mg Oral Daily   gabapentin  100 mg Oral TID   metoprolol succinate  50 mg Oral Daily   potassium chloride SA  40 mEq Oral TID   QUEtiapine  25 mg Oral QHS   sodium chloride flush  3 mL Intravenous Q12H   temazepam  15 mg Oral QHS   triamterene-hydrochlorothiazide  1 tablet Oral Daily   Continuous Infusions:  sodium chloride     sodium chloride 1 mL/kg/hr (08/24/20 0434)   PRN Meds: sodium chloride, ibuprofen, nitroGLYCERIN, senna-docusate, sodium chloride flush   Vital Signs    Vitals:   08/23/20 0343 08/23/20 1255 08/23/20 2134 08/24/20 0426  BP: (!) 155/54 (!) 146/53 (!) 146/63 (!) 151/47  Pulse: 65 72 70 63  Resp: 18 20 20 20   Temp: 98.2 F (36.8 C) 98.6 F (37 C) 99.4 F (37.4 C) 98.3 F (36.8 C)  TempSrc: Oral  Oral Oral  SpO2:  99% 96% 96%  Weight:      Height:        Intake/Output Summary (Last 24 hours) at 08/24/2020 0756 Last data filed at 08/24/2020 0600 Gross per 24 hour  Intake 652.35 ml  Output 650 ml  Net 2.35 ml   Last 3 Weights 08/20/2020 05/31/2020 05/10/2020  Weight (lbs) 165 lb 11.2 oz 164 lb 12.8 oz 166 lb 9.6 oz  Weight (kg) 75.161 kg 74.753 kg 75.569 kg      Telemetry    NSR - Personally Reviewed  ECG    N/A  Physical Exam   GEN: No acute distress.   Neck: No JVD Cardiac: RRR, 3/6 systolic murmurs, rubs, or gallops.  Respiratory: Clear to auscultation bilaterally. GI: Soft, nontender, non-distended  MS: No edema; No deformity. Neuro:  Nonfocal  Psych: Normal affect   Labs    Chemistry Recent Labs  Lab 08/20/20 0400 08/22/20 0405 08/24/20 0331  NA 135  134* 140  K 3.4* 3.1* 4.5  CL 106 102 107  CO2 22 26 25   GLUCOSE 136* 121* 119*  BUN 8 13 15   CREATININE 0.83  0.70 0.88 0.86  CALCIUM 9.0 9.4 9.8  GFRNONAA >60  >60 >60 >60  ANIONGAP 7 6 8      Hematology Recent Labs  Lab 08/20/20 0400 08/22/20 0405 08/24/20 0331  WBC 17.8* 10.0 9.4  RBC 3.68* 3.68* 3.94  HGB 11.5* 11.7* 12.4  HCT 33.3* 33.9* 36.6  MCV 90.5 92.1 92.9  MCH 31.3 31.8 31.5  MCHC 34.5 34.5 33.9  RDW 13.2 13.5 13.4  PLT 180 194 228    Radiology    MR BRAIN W WO CONTRAST  Result Date: 08/23/2020 CLINICAL DATA:  Neuro deficit, acute, stroke suspected. Additional history provided: Evaluate for possible CVA, recent fall, found down, patient with continued slow Nissen weakness on left side. EXAM: MRI HEAD WITHOUT AND WITH CONTRAST TECHNIQUE: Multiplanar, multiecho pulse sequences of the brain and surrounding structures were obtained without and with intravenous contrast. CONTRAST:  43mL GADAVIST GADOBUTROL 1 MMOL/ML IV SOLN COMPARISON:  Prior head CT examinations 08/19/2020 and earlier. FINDINGS: Brain: The examination  is intermittently motion degraded, limiting evaluation. Most notably, there is moderate/severe motion degradation of the coronal T2 weighted sequence, moderate/severe motion degradation of the axial T1 weighted postcontrast sequence, severe motion degradation of the coronal T1 weighted postcontrast sequence and moderate motion degradation of the sagittal T1 weighted postcontrast sequence. Moderate generalized cerebral and cerebellar atrophy. 5 mm focus of restricted diffusion within the left occipital lobe compatible with acute cortical infarct (series 5, image 72). Additional subtle acute cortical infarction changes are questioned more laterally within the left occipital lobe (versus artifact) (for instance as seen on series 5, image 73). Moderate multifocal T2/FLAIR hyperintensity within the cerebral white matter, nonspecific but compatible with chronic small  vessel ischemic disease. Tiny chronic lacunar infarcts within the left cerebellar hemisphere. No evidence of an intracranial mass. No chronic intracranial blood products are identified. No extra-axial fluid collection. No midline shift. Within described limitations, no pathologic intracranial enhancement is identified. Vascular: Expected proximal arterial flow voids. Skull and upper cervical spine: No focal marrow lesion. Incompletely assessed cervical spondylosis. Sinuses/Orbits: Visualized orbits show no acute finding. No significant paranasal sinus disease. IMPRESSION: Motion degraded examination, as described and limiting evaluation. 5 mm acute cortical infarct within the left occipital lobe. Additional subtle acute cortical infarction changes are questioned more laterally within the left occipital lobe (versus artifact). Moderate cerebral white matter chronic small vessel ischemic disease. Tiny chronic lacunar infarcts within the left cerebellar hemisphere. Moderate generalized parenchymal atrophy. Electronically Signed   By: Jackey Loge DO   On: 08/23/2020 13:10   DG Shoulder Left  Result Date: 08/22/2020 CLINICAL DATA:  Recent fall. LEFT shoulder pain. Limited range of motion in the LEFT shoulder. EXAM: LEFT SHOULDER - 2+ VIEW COMPARISON:  Chest x-ray on 08/19/2020 FINDINGS: There is deformity of the LEFT humeral head, associated multiple osteophytes and subacromial narrowing. There is no acute fracture or evidence for dislocation. Remote LEFT rib fractures are noted. IMPRESSION: No acute fracture or dislocation.  Chronic LEFT shoulder changes. Remote LEFT rib fractures. Electronically Signed   By: Norva Pavlov M.D.   On: 08/22/2020 16:19   VAS US CAROTID  Result Date: 08/23/2020 Carotid Arterial Duplex Study Patient Name:  Terri King  Date of Exam:   08/23/2020 Medical Rec #: [id number]        Accession #:    [id number] Date of Birth: October 07, 1934        Patient Gender: F Patient Age:   86Y Exam  Location:  [facility] Procedure:      VAS US CAROTID Referring Phys: [id number] DAVID GIRGUIS --------------------------------------------------------------------------------  Indications:       Syncope. Risk Factors:      Hypertension, hyperlipidemia, no history of smoking, prior                    MI, coronary artery disease. Other Factors:     AO stenosis. Comparison Study:  No previous exams Performing Technologist: Jody Hill RVT, RDMS  Examination Guidelines: A complete evaluation includes B-mode imaging, spectral Doppler, color Doppler, and power Doppler as needed of all accessible portions of each vessel. Bilateral testing is considered an integral part of a complete examination. Limited examinations for reoccurring indications may be performed as noted.  Right Carotid Findings: +----------+--------+--------+--------+---------------------+------------------+           PSV cm/sEDV cm/sStenosisPlaque Description   Comments           +----------+--------+--------+--------+---------------------+------------------+ CCA Prox  78      0  intimal thickening +----------+--------+--------+--------+---------------------+------------------+ CCA Distal64      6               calcific, focal and  intimal thickening                                   irregular                               +----------+--------+--------+--------+---------------------+------------------+ ICA Prox  91      11      1-39%   calcific, smooth and                                                      focal                                   +----------+--------+--------+--------+---------------------+------------------+ ICA Mid                           calcific,                                                                 heterogenous and                                                          irregular                                +----------+--------+--------+--------+---------------------+------------------+ ICA Distal77      11                                                      +----------+--------+--------+--------+---------------------+------------------+ ECA       109     0                                                       +----------+--------+--------+--------+---------------------+------------------+ +----------+--------+-------+----------------+-------------------+           PSV cm/sEDV cmsDescribe        Arm Pressure (mmHG) +----------+--------+-------+----------------+-------------------+ [id number]            Multiphasic, WNL                    +----------+--------+-------+----------------+-------------------+ +---------+--------+--+--------+-+--------------+ VertebralPSV cm/s93EDV cm/s0High resistant +---------+--------+--+--------+-+--------------+  Left Carotid Findings: +----------+--------+--------+--------+---------------------+------------------+  PSV cm/sEDV cm/sStenosisPlaque Description   Comments           +----------+--------+--------+--------+---------------------+------------------+ CCA Prox  58      0                                    intimal thickening +----------+--------+--------+--------+---------------------+------------------+ CCA Distal58      0                                    intimal thickening +----------+--------+--------+--------+---------------------+------------------+ ICA Prox  77      12              focal and                                                                 heterogenous                            +----------+--------+--------+--------+---------------------+------------------+ ICA Distal71      13                                                      +----------+--------+--------+--------+---------------------+------------------+ ECA       63      0               smooth and calcific                      +----------+--------+--------+--------+---------------------+------------------+ +----------+--------+--------+----------------+-------------------+           PSV cm/sEDV cm/sDescribe        Arm Pressure (mmHG) +----------+--------+--------+----------------+-------------------+ Subclavian                Multiphasic, WNL                    +----------+--------+--------+----------------+-------------------+ +---------+--------+--+--------+-+--------------+ VertebralPSV cm/s30EDV cm/s0High resistant +---------+--------+--+--------+-+--------------+     Preliminary     Cardiac Studies   Echo 08/20/20 1. The aortic valve is calcified. There is severe calcifcation of the  aortic valve. There is severe thickening of the aortic valve. Aortic valve  regurgitation is mild. Severe aortic valve stenosis. AVA 0.9cm2, mean  gradient 38mmHg, peak gradient 67mmHg,   Vmax 4.8960m/s   2. Left ventricular ejection fraction, by estimation, is 65 to 70%. Left  ventricular ejection fraction by 3D volume is 71 %. The left ventricle has  normal function. The left ventricle has no regional wall motion  abnormalities. There is moderate  asymmetric hypertrophy of the basal-septum. The rest of the LV segments  demonstrate mild left ventricular hypertrophy. Left ventricular diastolic  parameters are consistent with Grade I diastolic dysfunction (impaired  relaxation). Elevated left atrial  pressure. The average left ventricular global longitudinal strain is -20.2  %. The global longitudinal strain is normal.   3. Right ventricular systolic function is normal. The right ventricular  size is normal. There is severely elevated pulmonary artery systolic  pressure. The  estimated right ventricular systolic pressure is 86.2 mmHg.   4. The mitral valve is abnormal. There is moderate thickening of the  mitral valve leaflet(s). There is moderate calcification of the mitral  valve leaflet(s).  Moderate to severe mitral annular calcification. Trivial  mitral valve regurgitation. Mild  calcific mitral stenosis with MVA 1.5cm2 by continuity, mean gradient  at HR 74bpm.   5. The inferior vena cava is dilated in size with >50% respiratory  variability, suggesting right atrial pressure of 8 mmHg.   Comparison(s): Compared to prior TTE in 03/2019, the aortic stenosis is  now severe (previously moderate) and there is severe pulmonary  hypertension with PASP .   Patient Profile     86 y.o. female with a hx of aortic stenosis, hypertension, hyperlipidemia and osteoarthritis who is being seen for the evaluation of syncope and aortic stenosis at the request of Dr. Frederick Peers. Found to have small stroke on MRI of brain.   Assessment & Plan    Syncope  Aortic stenosis - Her syncope felt likely due to AS. Echocardiogram showing normal LV function with severe aortic stenosis (AVA 0.9cm2, mean  gradient , peak gradient ,  Vmax 4.77m/s). This has worsen from past. - Plan for R & L cath today and evaluation by [facility] team at [facility].  - Dr. Elease Hashimoto is aware of stroke findings but given stable status of patient felt okay for cath  - Will pre-medicate for iodine allergy (pt does not remember of this) - The patient understands that risks include but are not limited to stroke (1 in 1000), death (1 in 1000), kidney failure [usually temporary] (1 in 500), bleeding (1 in 200), allergic reaction [possibly serious] (1 in 200), and agrees to proceed.     3. Acute stroke - MRI of brain with 5 mm acute cortical infarct within the left occipital lobe. Additional subtle acute cortical infarction changes are questioned more laterally within the left occipital lobe (versus artifact). - Pending neurology evaluation.    For questions or updates, please contact [facility] Please consult [email or web address] for contact info under        SignedManson Passey, PA  08/24/2020, 7:56 AM      Attending Note:   The patient was seen and examined.  Agree with assessment and plan as noted above.  Changes made to the above note as needed.  Patient seen and independently examined with  Chelsea Aus, PA .   We discussed all aspects of the encounter. I agree with the assessment and plan as stated above.     Aortic stenosis:   has been cleared for cath by neuro.  She is at some risk for stroke but given the risk / benefits , I think she needs to have the procedure I have explained the risks, benefits, opotions. She and her son understand and agree to proceed with R and L heart cath      I have spent a total of 40 minutes with patient reviewing hospital  notes , telemetry, EKGs, labs and examining patient as well as establishing an assessment and plan that was discussed with the patient.  > 50% of time was spent in direct patient care.    Vesta Mixer, Montez Hageman., MD, [facility] 08/24/2020, 12:16 PM [address],  [address] Office [phone] Pager [phone]

## 2020-08-24 NOTE — Care Management Important Message (Signed)
Important Message  Patient Details IM Letter given to the Patient Name: Terri King MRN: [id number] Date of Birth: 1934-09-21   Medicare Important Message Given:  Yes     Caren Macadam 08/24/2020, 9:58 AM

## 2020-08-24 NOTE — Progress Notes (Addendum)
OT Cancellation Note  Patient Details Name: ITZAMAR TRAYNOR MRN: [id number] DOB: Aug 19, 1934   Cancelled Treatment:    Reason Eval/Treat Not Completed: Patient at procedure or test/ unavailable. Patient getting heart cath today. Will hold today and f/u with patient as able.   Shirlee Whitmire L Ramon Zanders 08/24/2020, 11:38 AM

## 2020-08-24 NOTE — Progress Notes (Signed)
Site area: rt femoral venous sheath pulled and pressure held by Army Melia, RN Site Prior to Removal:  Level 0 Pressure Applied For: 15 minutes Manual:   yes Patient Status During Pull:  stable Post Pull Site:  Level 0 Post Pull Instructions Given:  yes Post Pull Pulses Present: rt dp palpable Dressing Applied:  gauze and tegaderm Bedrest begins @ 1830 Comments:

## 2020-08-24 NOTE — TOC Progression Note (Signed)
Transition of Care [facility]) - Progression Note    Patient Details  Name: Terri King MRN: [id number] Date of Birth: 1934-06-30  [facility] CM/SW Contact  Geni Bers, RN Phone Number: 08/24/2020, 3:35 PM  Clinical Narrative:    Pt's son selected [facility] for SNF when pt is stable for discharge. Spoke with Tresa Endo at [facility], if a bed is available pt may go.         Expected Discharge Plan and Services                                                 Social Determinants of Health (SDOH) Interventions    Readmission Risk Interventions No flowsheet data found.

## 2020-08-24 NOTE — Progress Notes (Signed)
I have reviewed the MRI, small occipital infarct with no SWI change to suggest hemorrhage. She has T2 change suggesting this is at least subacute. I doubt this is related to her syncope.  At this time, I think that the hemorrhagic risk is very low and do not have any objection to proceeding with preoperative workup as needed. Will see as formal consultation later today.   Ritta Slot, MD [facility] [phone]  If 7pm- 7am, please page neurology on call as listed in AMION.

## 2020-08-24 NOTE — Progress Notes (Signed)
Received patient from [facility] via [facility]. Pt alert and oriented X$ , skin warm and dry, resp even and unlabored.  Pt complain of 10 out of  10 pain due to a fx Femur from a fall at home.  PA Lindsey aware and order will be placed for pain.  Consent signed for procedure, IVF of  NS infusing in Left forearm.  Pt placed on bedside monitor and waiting for cath procedure.

## 2020-08-24 NOTE — Consult Note (Addendum)
Neurology Consultation  Reason for Consult: MRI brain with 5 mm cortical left occipital lobe infarction Referring Physician: Dr. Frederick Peers  CC: Syncope   History is obtained from: Patient, Chart review   HPI: Terri King is a 86 y.o. female with a medical history significant for essential hypertension, hyperlipidemia, coronary artery disease, myocardial infarction, obesity, and aortic stenosis who presented to the ED 08/19/2020 after being found down at home by her family. She laid in the floor overnight at the base of her stairs unable to recall her syncopal event or events leading up to it but she was unable to stand or ambulate on her left leg following. ED work up revealed a lateral condyle fracture of her left femur and she was admitted for further evaluation. On 08/20/2020 an echocardiogram was obtained revealing severe aortic valve stenosis and was planned for right and left cardiac catheterization today. MRI brain was obtained for further work up of syncope revealing a left occipital lobe infarction and neurology was consulted for further evaluation and stroke work-up.  ROS: A complete ROS was performed and is negative except as noted in the HPI.  Past Medical History:  Diagnosis Date   Aortic stenosis    Stage B Moderate AS (Echocardiogram 03/2019: EF 65-70, no RWMA, mild LVH, Gr 1 DD, RVSP 37.6 (mild elevation), mild MR, mod AI, mod AS (mean 28 mmHg/peak 52.3 mmHg/LVOT AV VTI ratio: 0.37)   CAD (coronary artery disease)    DR Tenny Craw; by notes normal cath in 1986, normal stress 2013   Family history of anesthesia complication    NIENCE had sensitivity   History of pneumonia 2008   HTN (hypertension)    dr Posey Rea   Hyperlipemia    MI (myocardial infarction) (HCC)    DR Tenny Craw   Osteoarthritis    Shortness of breath    Past Surgical History:  Procedure Laterality Date   JOINT REPLACEMENT  12/13   L TKR    KNEE ARTHROSCOPY     left   PARTIAL HYSTERECTOMY     TONSILLECTOMY      TOTAL KNEE ARTHROPLASTY  01/23/2012   Procedure: TOTAL KNEE ARTHROPLASTY;  Surgeon: Loreta Ave, MD;  Location: [facility] OR;  Service: Orthopedics;  Laterality: Left;   TOTAL KNEE ARTHROPLASTY  12/'05/2011   left knee   Family History  Problem Relation Age of Onset   Glaucoma Father    Hypertension Mother    Hypertension Other    Social History:   reports that she has never smoked. She has never used smokeless tobacco. She reports that she does not drink alcohol and does not use drugs.  Medications  Current Facility-Administered Medications:    0.9 %  sodium chloride infusion, 250 mL, Intravenous, PRN, Bhagat, Bhavinkumar, PA   [EXPIRED] 0.9% sodium chloride infusion, 3 mL/kg/hr, Intravenous, Continuous, Stopping Infusion hung by another clincian at 08/24/20 0806 **FOLLOWED BY** 0.9% sodium chloride infusion, 1 mL/kg/hr, Intravenous, Continuous, Bhagat, Bhavinkumar, PA, Last Rate: 75.2 mL/hr at 08/24/20 0434, 1 mL/kg/hr at 08/24/20 0434   [MAR Hold] amLODipine (NORVASC) tablet 5 mg, 5 mg, Oral, Daily, Lewie Chamber, MD, 5 mg at 08/24/20 0948   [MAR Hold] enoxaparin (LOVENOX) injection 40 mg, 40 mg, Subcutaneous, Q24H, Lewie Chamber, MD, 40 mg at 08/24/20 0428   [MAR Hold] escitalopram (LEXAPRO) tablet 10 mg, 10 mg, Oral, Daily, Lewie Chamber, MD, 10 mg at 08/24/20 0948   [MAR Hold] famotidine (PEPCID) tablet 40 mg, 40 mg, Oral, Daily, Lewie Chamber, MD, 40  mg at 08/24/20 0948   [MAR Hold] gabapentin (NEURONTIN) capsule 100 mg, 100 mg, Oral, TID, Lewie ChamberGirguis, David, MD, 100 mg at 08/24/20 0948   Heparin (Porcine) in NaCl 1000-0.9 UT/500ML-% SOLN, , , PRN, Marykay LexHarding, David W, MD, 500 mL at 08/24/20 1634   heparin sodium (porcine) injection, , , PRN, Marykay LexHarding, David W, MD, 3,500 Units at 08/24/20 1706   [MAR Hold] ibuprofen (ADVIL) tablet 200 mg, 200 mg, Oral, Q6H PRN, Lewie ChamberGirguis, David, MD, 200 mg at 08/23/20 2042   lidocaine (PF) (XYLOCAINE) 1 % injection, , , PRN, Marykay LexHarding, David W, MD, 4 mL at  08/24/20 1653   [MAR Hold] metoprolol succinate (TOPROL-XL) 24 hr tablet 50 mg, 50 mg, Oral, Daily, Lewie ChamberGirguis, David, MD, 50 mg at 08/24/20 0948   [MAR Hold] nitroGLYCERIN (NITROSTAT) SL tablet 0.4 mg, 0.4 mg, Sublingual, Q5 min PRN, Lewie ChamberGirguis, David, MD   [facility][MAR Hold] potassium chloride SA (KLOR-CON) CR tablet 40 mEq, 40 mEq, Oral, TID, Lewie ChamberGirguis, David, MD, 40 mEq at 08/24/20 0948   [MAR Hold] QUEtiapine (SEROQUEL) tablet 25 mg, 25 mg, Oral, QHS, Lewie ChamberGirguis, David, MD, 25 mg at 08/23/20 2043   Radial Cocktail/Verapamil only, , , PRN, Marykay LexHarding, David W, MD, 10 mL at 08/24/20 1642   [MAR Hold] senna-docusate (Senokot-S) tablet 1 tablet, 1 tablet, Oral, QHS PRN, Lewie ChamberGirguis, David, MD   [facility][MAR Hold] sodium chloride flush (NS) 0.9 % injection 3 mL, 3 mL, Intravenous, Q12H, Lewie ChamberGirguis, David, MD, 3 mL at 08/24/20 0949   sodium chloride flush (NS) 0.9 % injection 3 mL, 3 mL, Intravenous, PRN, Bhagat, Bhavinkumar, PA   [MAR Hold] temazepam (RESTORIL) capsule 15 mg, 15 mg, Oral, QHS, Lewie ChamberGirguis, David, MD, 15 mg at 08/23/20 2042   [MAR Hold] triamterene-hydrochlorothiazide (MAXZIDE-25) 37.5-25 MG per tablet 1 tablet, 1 tablet, Oral, Daily, Lewie ChamberGirguis, David, MD, 1 tablet at 08/24/20 [id number]  Exam: Current vital signs: BP (!) 208/54   Pulse 65   Temp 97.8 F (36.6 C)   Resp 15   Ht 5\' 3"  (1.6 m)   Wt 75.2 kg   SpO2 98%   BMI 29.35 kg/m  Vital signs in last 24 hours: Temp:  [97.8 F (36.6 C)-99.4 F (37.4 C)] 97.8 F (36.6 C) (07/06 1128) Pulse Rate:  [63-72] 65 (07/06 1325) Resp:  [15-20] 15 (07/06 1325) BP: (146-212)/(47-63) 208/54 (07/06 1325) SpO2:  [96 %-100 %] 98 % (07/06 1624)  GENERAL: Awake, alert, sitting in cath lab holding room on stretcher Psych: Affect appropriate for situation, calm and cooperative with examination Head: Normocephalic and atraumatic EENT: No OP obstruction, dry mucous membranes, normal conjunctivae LUNGS: Normal respiratory effort. Non-labored breathing, on room air.  CV: Regular  rate on telemetry, regular rhythm  ABDOMEN: Soft, non-tender, non-distended Ext: warm, well perfused, no obvious deformity. Left knee immobilizer in place.   NEURO:  Mental Status: Awake, alert, and oriented to person, place, time, and situation. She initially states that the month is June but after being asked the most recent holiday, corrects to the correct month as July.  Speech/Language: speech is intact without dysarthria or aphasia.   Naming, repetition, fluency, and comprehension intact. No neglect is noted. Cranial Nerves:  II: PERRL 3 mm/brisk. Visual fields full.  III, IV, VI: EOMI without ptosis or gaze preference. V: Sensation is intact to light touch and symmetrical to face.  VII: Face is symmetric resting and smiling.  VIII: Hearing is intact to voice IX, X: Palate elevation is symmetric. Phonation normal.  XI: Normal sternocleidomastoid and trapezius  muscle strength XII: Tongue protrudes midline without fasciculations.   Motor: 5/5 strength present in bilateral upper extremities.  Left lower extremity strength assessment is limited 2/2 known left femur fracture with knee immobilizer in place.  She is able to sustain antigravity movement without vertical drift in bilateral upper and lower extremities.  Tone is normal. Bulk is normal.  Sensation: Intact to light touch bilaterally in all four extremities.  Coordination: FTN intact bilaterally. HKS assessment pain limited, known left femur fracture. No pronator drift. DTRs: 2+ and symmetric biceps and brachioradialis.   Gait: Deferred  Labs I have reviewed labs in epic and the results pertinent to this consultation are: CBC    Component Value Date/Time   WBC 9.4 08/24/2020 0331   RBC 3.94 08/24/2020 0331   HGB 12.4 08/24/2020 0331   HCT 36.6 08/24/2020 0331   PLT 228 08/24/2020 0331   MCV 92.9 08/24/2020 0331   MCH 31.5 08/24/2020 0331   MCHC 33.9 08/24/2020 0331   RDW 13.4 08/24/2020 0331   LYMPHSABS 1.9  08/24/2020 0331   MONOABS 1.6 (H) 08/24/2020 0331   EOSABS 0.4 08/24/2020 0331   BASOSABS 0.1 08/24/2020 0331   CMP     Component Value Date/Time   NA 140 08/24/2020 0331   K 4.5 08/24/2020 0331   CL 107 08/24/2020 0331   CO2 25 08/24/2020 0331   GLUCOSE 119 (H) 08/24/2020 0331   BUN 15 08/24/2020 0331   CREATININE 0.86 08/24/2020 0331   CREATININE 0.86 10/30/2019 1153   CALCIUM 9.8 08/24/2020 0331   PROT 7.4 05/10/2020 1051   ALBUMIN 4.2 05/10/2020 1051   AST 21 05/10/2020 1051   ALT 11 05/10/2020 1051   ALKPHOS 98 05/10/2020 1051   BILITOT 0.6 05/10/2020 1051   GFRNONAA >60 08/24/2020 0331   GFRNONAA 62 10/30/2019 1153   GFRAA 72 10/30/2019 1153   Lipid Panel     Component Value Date/Time   CHOL 153 08/23/2020 1427   TRIG 144 08/23/2020 1427   HDL 41 08/23/2020 1427   CHOLHDL 3.7 08/23/2020 1427   VLDL 29 08/23/2020 1427   LDLCALC 83 08/23/2020 1427   LDLDIRECT 186.1 03/24/2013 0923   Lab Results  Component Value Date   HGBA1C 5.2 08/23/2020   Imaging I have reviewed the images obtained:  CT-scan of the brain 08/19/2020: 1. No acute intracranial abnormality. No skull fracture. 2. Atrophy and chronic small vessel ischemia, with slight progression since 2017.  MRI examination of the brain 08/23/2020: Motion degraded examination, as described and limiting evaluation. 5 mm acute cortical infarct within the left occipital lobe. Additional subtle acute cortical infarction changes are questioned more laterally within the left occipital lobe (versus artifact). Moderate cerebral white matter chronic small vessel ischemic disease. Tiny chronic lacunar infarcts within the left cerebellar hemisphere. Moderate generalized parenchymal atrophy.   Echo 08/20/20  1. The aortic valve is calcified. There is severe calcifcation of the aortic valve. There is severe thickening of the aortic valve. Aortic valve regurgitation is mild. Severe aortic valve stenosis. AVA 0.9cm2, mean  gradient , peak gradient , Vmax 4.62m/s   2. Left ventricular ejection fraction, by estimation, is 65 to 70%. Left ventricular ejection fraction by 3D volume is 71 %. The left ventricle has normal function. The left ventricle has no regional wall motion abnormalities. There is moderate asymmetric hypertrophy of the basal-septum. The rest of the LV segments demonstrate mild left ventricular hypertrophy. Left ventricular diastolic parameters are consistent with Grade I diastolic dysfunction (  impaired relaxation). Elevated left atrial pressure. The average left ventricular global longitudinal strain is -20.2 %. The global longitudinal strain is normal.   3. Right ventricular systolic function is normal. The right ventricular size is normal. There is severely elevated pulmonary artery systolic pressure. The estimated right ventricular systolic pressure is 86.2 mmHg.   4. The mitral valve is abnormal. There is moderate thickening of the mitral valve leaflet(s). There is moderate calcification of the mitral valve leaflet(s). Moderate to severe mitral annular calcification. Trivial mitral valve regurgitation. Mild calcific mitral stenosis with MVA 1.5cm2 by continuity, mean gradient at HR 74bpm.   5. The inferior vena cava is dilated in size with >50% respiratory variability, suggesting right atrial pressure of 8 mmHg.  Assessment: 86 y.o. female who presented to the ED for evaluation of syncope and was found to have severe aortic stenosis and a left femur fracture. MRI obtained with findings of a subacute left occipital infarct, likely an incidental finding as the presenting complaint was syncope with severe aortic stenosis seen on echocardiogram.  - Examination reveals patient without focal neurologic deficit.  - MRI with a subacute cortical left occipital lobe infarct, moderate cerebral white matter chronic small vessel ischemic disease, tiny chronic left cerebellar lacunar infarcts and moderate  generalized parenchymal atrophy.  - Etiology likely atherosclerotic but will complete stroke work up for further etiology evaluation and to optimize patient for stroke prophylaxis.   Impression: Subacute left occipital cortical infarct without clinical correlation  Syncope 2/2 severe aortic stenosis Remote left cerebellar lacunar infarcts Right and left heart catheterization to be complete 08/24/2020   Recommendations: -MRA head and neck - LDL above goal of 70, initiate statin therapy - Frequent neuro checks - Prophylactic therapy- Antiplatelet med: From a purely stroke prevention standpoint, I would favor dual antiplatelet therapy for 3 weeks, but will defer to cardiology. - Risk factor modification - Telemetry monitoring - PT consult, OT consult, Speech consult  Lanae Boast, AGAC-NP [facility] Pager: [phone]  I have seen the patient and reviewed the above note.  She has an embolic appearing stroke, though it appears to be asymptomatic and I feel is likely incidental in nature.  She will need vascular imaging.  I would favor dual antiplatelet therapy for 3 weeks, however I am not sure the timing of surgery, and will defer to cardiology if this is going to be relatively seen.  I will start a baby aspirin at this time.  My concern would be is if she has subclinical A. fib as she is currently in the age range for it, and I would favor prolonged cardiac monitoring.  Also I will check an EEG.    Ritta Slot, MD [facility] [phone]  If 7pm- 7am, please page neurology on call as listed in AMION.

## 2020-08-24 NOTE — Progress Notes (Signed)
Progress Note    Terri King   ZOX:[id number]  DOB: 1935-01-12  DOA: 08/19/2020     5  PCP: Tresa Garter, MD  CC: passed out at home; fall  Hospital Course: Terri King is a 86 y.o. female with medical history significant for HTN, moderate AS (per 03/2019 echo), CAD, hx of MI, CAD, OA who presented after being found on the floor at home by family.  She reported she passed out and laid on the floor overnight. She does not remember what made her pass out and states she does not remember being dizzy or having chest pain or palpitations prior to her passing out.   She has had no chest pain, shortness of breath (although does endorse DOE which has been long standing), cough, fever, nausea, vomiting, diarrhea since she woke up on the floor.   She states she has not had any numbness or weakness of her extremities and has had no visual change or slurred speech (however when questioned further about vision changes again on 7/5, she did admit to some blurry vision recently but could not elaborate further).   After the fall, she was unable to stand up or ambulate on her left leg.  States she has never had an episode like this in the past.  She reports she has never had seizures. She denies tobacco, alcohol, illicit drug use.  She underwent multiple imaging studies regarding her fall.  X-rays revealed a left lateral condyle fracture of the femur.  She was evaluated by orthopedic surgery and recommended for a knee immobilizer, touchdown weightbearing, and to follow-up outpatient with [facility] in approximately 2 weeks.  Further work-up was commenced regarding her syncope, falling, lethargy/weakness, and confusion as per family.  She was found to be on multiple over sedating medications including temazepam, gabapentin, Seroquel.  Her temazepam was decreased down to 15 mg nightly with the goal of further weaning off if able.  Gabapentin dose was also reduced as she was on max  dose for her renal function.  She was also on Seroquel twice daily for years due to severe difficulty with sleep at night (her dose was modified and continued only nightly).  Repeat echo was also ordered and revealed progression of her aortic stenosis, now graded as severe.  MRI brain also obtained which showed a 5 mm acute cortical infarct within the left occipital lobe and tiny chronic lacunar infarcts within the left cerebellar hemisphere (after findings discussed with her sons bedside, they did endorse that she has had difficulty with balance as well which they attributed to her history of knee problems).  She was evaluated by cardiology at [facility] and recommended for transfer to [facility] for further TAVR workup and will also need neurology evaluation regarding her acute and old CVAs.   Interval History:  Family bedside this am and update given/questions answered.  Still awaiting transfer to [facility] but tentative plan is for L/R heart cath today and neuro eval.   ROS: Constitutional: negative for chills and fevers, Respiratory: negative for cough, Cardiovascular: negative for chest pain, and Gastrointestinal: negative for abdominal pain  Assessment & Plan:  Altered mental status - resolved  -At this point, may be multifactorial in setting of polypharmacy (temazepam, gabapentin, Seroquel), recent CVA - meds adjusted; would continue to wean/reduce as able; have decreased temazepam from 30 mg qhs down to 15 mg qhs. Reduced gaba from 300 mg TID to 100 mg TID, and reduced seroquel from BID to 25 mg  qhs   Fracture of the left femoral condyle, closed Status post orthopedics consultation recommended knee immobilizer and follow-up in the office in 2 weeks.  Severe AS - progression from 03/2019 echo, now with severe AS; given syncopal episode prior to admission, will ask cardiology to also evaluate  - plan is transfer to [facility] for further workup  - R/L heart cath planned for 7/6  Acute CVA - acute 5 mm left  occipital infarct seen on MRI brain; also chronic infarcts in left cerebellum; less likely to be cardioembolic given unilateral distribution but more workup needed still - since getting heart cath on 7/6 tentatively will hold off on CTA head/neck and order carotid u/s for now to evaluate (still waiting on final read) - transfer to [facility]; Dr. Pearlean Brownie aware of transfer - check TSH (1.542), A1c (5.2 %), Lipid (LDL 83, defer to neuro on statin given 84) - further antiplatelet/anticoag regimen to be determined upon further workup and eval by neurology as well   Essential hypertension Continue Norvasc and Maxide   Hypokalemia Replete and trend  Leukocytosis - likely reactive on admission; no bands noted - has resolved   Old records reviewed in assessment of this patient  Antimicrobials:   DVT prophylaxis: enoxaparin (LOVENOX) injection 40 mg Start: 08/20/20 0000   Code Status:   Code Status: Full Code Family Communication: Son  Disposition Plan: Status is: Inpatient  Remains inpatient appropriate because:Ongoing diagnostic testing needed not appropriate for outpatient work up and Inpatient level of care appropriate due to severity of illness  Dispo: The patient is from: Home              Anticipated d/c is to: SNF              Patient currently is not medically stable to d/c.   Difficult to place patient No  Risk of unplanned readmission score: Unplanned Admission- Pilot do not use: 14.13   Objective: Blood pressure (!) 174/50, pulse 64, temperature 97.8 F (36.6 C), resp. rate 16, height  (1.6 m), weight 75.2 kg, SpO2 97 %.  Examination: General appearance: alert, cooperative, and no distress Head: Normocephalic, without obvious abnormality, atraumatic Eyes:  EOMI Lungs: clear to auscultation bilaterally Heart: regular rate and rhythm, S1, S2 normal, and 3/6 HSM heard throughout precordium Abdomen: normal findings: bowel sounds normal and soft, non-tender Extremities:   left knee noted in immobilizer Skin: mobility and turgor normal Neurologic: strength in LLE limited by knee pain but 5/5 in remainder extremities. No obvious paresthesias reported during testing   Consultants:  Cardiology Neurology  Procedures:    Data Reviewed: I have personally reviewed following labs and imaging studies Results for orders placed or performed during the hospital encounter of 08/19/20 (from the past 24 hour(s))  TSH     Status: None   Collection Time: 08/23/20  2:27 PM  Result Value Ref Range   TSH 1.542 0.350 - 4.500 uIU/mL  Lipid panel     Status: None   Collection Time: 08/23/20  2:27 PM  Result Value Ref Range   Cholesterol 153 0 - 200 mg/dL   Triglycerides 161 <096 mg/dL   HDL 41 >04 mg/dL   Total CHOL/HDL Ratio 3.7 RATIO   VLDL 29 0 - 40 mg/dL   LDL Cholesterol 83 0 - 99 mg/dL  Hemoglobin V4U     Status: None   Collection Time: 08/23/20  2:27 PM  Result Value Ref Range   Hgb A1c MFr Bld 5.2  4.8 - 5.6 %   Mean Plasma Glucose 102.54 mg/dL  Basic metabolic panel     Status: Abnormal   Collection Time: 08/24/20  3:31 AM  Result Value Ref Range   Sodium 140 135 - 145 mmol/L   Potassium 4.5 3.5 - 5.1 mmol/L   Chloride 107 98 - 111 mmol/L   CO2 25 22 - 32 mmol/L   Glucose, Bld 119 (H) 70 - 99 mg/dL   BUN 15 8 - 23 mg/dL   Creatinine, Ser 7.82 0.44 - 1.00 mg/dL   Calcium 9.8 8.9 - 95.6 mg/dL   GFR, Estimated >21 >30 mL/min   Anion gap 8 5 - 15  CBC with Differential/Platelet     Status: Abnormal   Collection Time: 08/24/20  3:31 AM  Result Value Ref Range   WBC 9.4 4.0 - 10.5 K/uL   RBC 3.94 3.87 - 5.11 MIL/uL   Hemoglobin 12.4 12.0 - 15.0 g/dL   HCT 86.5 78.4 - 69.6 %   MCV 92.9 80.0 - 100.0 fL   MCH 31.5 26.0 - 34.0 pg   MCHC 33.9 30.0 - 36.0 g/dL   RDW 29.5 28.4 - 13.2 %   Platelets 228 150 - 400 K/uL   nRBC 0.0 0.0 - 0.2 %   Neutrophils Relative % 56 %   Neutro Abs 5.3 1.7 - 7.7 K/uL   Lymphocytes Relative 20 %   Lymphs Abs 1.9 0.7 - 4.0  K/uL   Monocytes Relative 17 %   Monocytes Absolute 1.6 (H) 0.1 - 1.0 K/uL   Eosinophils Relative 5 %   Eosinophils Absolute 0.4 0.0 - 0.5 K/uL   Basophils Relative 1 %   Basophils Absolute 0.1 0.0 - 0.1 K/uL   Immature Granulocytes 1 %   Abs Immature Granulocytes 0.11 (H) 0.00 - 0.07 K/uL  Magnesium     Status: None   Collection Time: 08/24/20  3:31 AM  Result Value Ref Range   Magnesium 1.8 1.7 - 2.4 mg/dL    Recent Results (from the past 240 hour(s))  SARS CORONAVIRUS 2 (TAT 6-24 HRS) Nasopharyngeal Nasopharyngeal Swab     Status: None   Collection Time: 08/20/20  6:31 AM   Specimen: Nasopharyngeal Swab  Result Value Ref Range Status   SARS Coronavirus 2 NEGATIVE NEGATIVE Final    Comment: (NOTE) SARS-CoV-2 target nucleic acids are NOT DETECTED.  The SARS-CoV-2 RNA is generally detectable in upper and lower respiratory specimens during the acute phase of infection. Negative results do not preclude SARS-CoV-2 infection, do not rule out co-infections with other pathogens, and should not be used as the sole basis for treatment or other patient management decisions. Negative results must be combined with clinical observations, patient history, and epidemiological information. The expected result is Negative.  Fact Sheet for Patients: HairSlick.no  Fact Sheet for Healthcare Providers: quierodirigir.com  This test is not yet approved or cleared by the [place] FDA and  has been authorized for detection and/or diagnosis of SARS-CoV-2 by FDA under an Emergency Use Authorization (EUA). This EUA will remain  in effect (meaning this test can be used) for the duration of the COVID-19 declaration under Se ction 564(b)(1) of the Act, 21 U.S.C. section 360bbb-3(b)(1), unless the authorization is terminated or revoked sooner.  Performed at [facility], [address] [place], [place] [address]      Radiology  Studies: MR BRAIN W WO CONTRAST  Result Date: 08/23/2020 CLINICAL DATA:  Neuro deficit, acute, stroke suspected.  Additional history provided: Evaluate for possible CVA, recent fall, found down, patient with continued slow Nissen weakness on left side. EXAM: MRI HEAD WITHOUT AND WITH CONTRAST TECHNIQUE: Multiplanar, multiecho pulse sequences of the brain and surrounding structures were obtained without and with intravenous contrast. CONTRAST:  1mL GADAVIST GADOBUTROL 1 MMOL/ML IV SOLN COMPARISON:  Prior head CT examinations 08/19/2020 and earlier. FINDINGS: Brain: The examination is intermittently motion degraded, limiting evaluation. Most notably, there is moderate/severe motion degradation of the coronal T2 weighted sequence, moderate/severe motion degradation of the axial T1 weighted postcontrast sequence, severe motion degradation of the coronal T1 weighted postcontrast sequence and moderate motion degradation of the sagittal T1 weighted postcontrast sequence. Moderate generalized cerebral and cerebellar atrophy. 5 mm focus of restricted diffusion within the left occipital lobe compatible with acute cortical infarct (series 5, image 72). Additional subtle acute cortical infarction changes are questioned more laterally within the left occipital lobe (versus artifact) (for instance as seen on series 5, image 73). Moderate multifocal T2/FLAIR hyperintensity within the cerebral white matter, nonspecific but compatible with chronic small vessel ischemic disease. Tiny chronic lacunar infarcts within the left cerebellar hemisphere. No evidence of an intracranial mass. No chronic intracranial blood products are identified. No extra-axial fluid collection. No midline shift. Within described limitations, no pathologic intracranial enhancement is identified. Vascular: Expected proximal arterial flow voids. Skull and upper cervical spine: No focal marrow lesion. Incompletely assessed cervical spondylosis. Sinuses/Orbits:  Visualized orbits show no acute finding. No significant paranasal sinus disease. IMPRESSION: Motion degraded examination, as described and limiting evaluation. 5 mm acute cortical infarct within the left occipital lobe. Additional subtle acute cortical infarction changes are questioned more laterally within the left occipital lobe (versus artifact). Moderate cerebral white matter chronic small vessel ischemic disease. Tiny chronic lacunar infarcts within the left cerebellar hemisphere. Moderate generalized parenchymal atrophy. Electronically Signed   By: Jackey Loge DO   On: 08/23/2020 13:10   DG Shoulder Left  Result Date: 08/22/2020 CLINICAL DATA:  Recent fall. LEFT shoulder pain. Limited range of motion in the LEFT shoulder. EXAM: LEFT SHOULDER - 2+ VIEW COMPARISON:  Chest x-ray on 08/19/2020 FINDINGS: There is deformity of the LEFT humeral head, associated multiple osteophytes and subacromial narrowing. There is no acute fracture or evidence for dislocation. Remote LEFT rib fractures are noted. IMPRESSION: No acute fracture or dislocation.  Chronic LEFT shoulder changes. Remote LEFT rib fractures. Electronically Signed   By: Norva Pavlov M.D.   On: 08/22/2020 16:19   VAS US CAROTID  Result Date: 08/23/2020 Carotid Arterial Duplex Study Patient Name:  DOREENE FORREY  Date of Exam:   08/23/2020 Medical Rec #: [id number]        Accession #:    [id number] Date of Birth: [birth year]        Patient Gender: F Patient Age:   86Y Exam Location:  [facility] Procedure:      VAS US CAROTID Referring Phys: [id number] Norberta Stobaugh --------------------------------------------------------------------------------  Indications:       Syncope. Risk Factors:      Hypertension, hyperlipidemia, no history of smoking, prior                    MI, coronary artery disease. Other Factors:     AO stenosis. Comparison Study:  No previous exams Performing Technologist: Jody Hill RVT, RDMS  Examination Guidelines: A complete  evaluation includes B-mode imaging, spectral Doppler, color Doppler, and power Doppler as needed of all accessible portions of each vessel.  Bilateral testing is considered an integral part of a complete examination. Limited examinations for reoccurring indications may be performed as noted.  Right Carotid Findings: +----------+--------+--------+--------+---------------------+------------------+           PSV cm/sEDV cm/sStenosisPlaque Description   Comments           +----------+--------+--------+--------+---------------------+------------------+ CCA Prox  78      0                                    intimal thickening +----------+--------+--------+--------+---------------------+------------------+ CCA Distal64      6               calcific, focal and  intimal thickening                                   irregular                               +----------+--------+--------+--------+---------------------+------------------+ ICA Prox  91      11      1-39%   calcific, smooth and                                                      focal                                   +----------+--------+--------+--------+---------------------+------------------+ ICA Mid                           calcific,                                                                 heterogenous and                                                          irregular                               +----------+--------+--------+--------+---------------------+------------------+ ICA Distal77      11                                                      +----------+--------+--------+--------+---------------------+------------------+ ECA       109     0                                                       +----------+--------+--------+--------+---------------------+------------------+ +----------+--------+-------+----------------+-------------------+  PSV cm/sEDV  cmsDescribe        Arm Pressure (mmHG) +----------+--------+-------+----------------+-------------------+ [id number]            Multiphasic, WNL                    +----------+--------+-------+----------------+-------------------+ +---------+--------+--+--------+-+--------------+ VertebralPSV cm/s93EDV cm/s0High resistant +---------+--------+--+--------+-+--------------+  Left Carotid Findings: +----------+--------+--------+--------+---------------------+------------------+           PSV cm/sEDV cm/sStenosisPlaque Description   Comments           +----------+--------+--------+--------+---------------------+------------------+ CCA Prox  58      0                                    intimal thickening +----------+--------+--------+--------+---------------------+------------------+ CCA Distal58      0                                    intimal thickening +----------+--------+--------+--------+---------------------+------------------+ ICA Prox  77      12              focal and                                                                 heterogenous                            +----------+--------+--------+--------+---------------------+------------------+ ICA Distal71      13                                                      +----------+--------+--------+--------+---------------------+------------------+ ECA       63      0               smooth and calcific                     +----------+--------+--------+--------+---------------------+------------------+ +----------+--------+--------+----------------+-------------------+           PSV cm/sEDV cm/sDescribe        Arm Pressure (mmHG) +----------+--------+--------+----------------+-------------------+ Subclavian                Multiphasic, WNL                    +----------+--------+--------+----------------+-------------------+ +---------+--------+--+--------+-+--------------+  VertebralPSV cm/s30EDV cm/s0High resistant +---------+--------+--+--------+-+--------------+     Preliminary    VAS US CAROTID      MR BRAIN W WO CONTRAST  Final Result    DG Shoulder Left  Final Result    DG Chest 1 View  Final Result    DG Shoulder Right  Final Result    DG Hip Unilat W or Wo Pelvis 2-3 Views Left  Final Result    DG Knee Complete 4 Views Left  Final Result    CT Head Wo Contrast  Final Result    CT Cervical Spine Wo Contrast  Final Result      Scheduled Meds:  amLODipine  5 mg Oral Daily  enoxaparin (LOVENOX) injection  40 mg Subcutaneous Q24H   escitalopram  10 mg Oral Daily   famotidine  40 mg Oral Daily   gabapentin  100 mg Oral TID   metoprolol succinate  50 mg Oral Daily   potassium chloride SA  40 mEq Oral TID   QUEtiapine  25 mg Oral QHS   sodium chloride flush  3 mL Intravenous Q12H   temazepam  15 mg Oral QHS   triamterene-hydrochlorothiazide  1 tablet Oral Daily   PRN Meds: sodium chloride, ibuprofen, nitroGLYCERIN, senna-docusate, sodium chloride flush Continuous Infusions:  sodium chloride     sodium chloride 1 mL/kg/hr (08/24/20 0434)     LOS: 5 days  Time spent: Greater than 50% of the 35 minute visit was spent in counseling/coordination of care for the patient as laid out in the A&P.   Lewie Chamberavid Shewanda Sharpe, MD [facility] 08/24/2020, 1:01 PM

## 2020-08-25 ENCOUNTER — Inpatient Hospital Stay (HOSPITAL_COMMUNITY): Payer: Medicare HMO

## 2020-08-25 ENCOUNTER — Encounter (HOSPITAL_COMMUNITY): Payer: Self-pay | Admitting: Cardiology

## 2020-08-25 DIAGNOSIS — I350 Nonrheumatic aortic (valve) stenosis: Secondary | ICD-10-CM | POA: Diagnosis not present

## 2020-08-25 DIAGNOSIS — R55 Syncope and collapse: Secondary | ICD-10-CM | POA: Diagnosis not present

## 2020-08-25 LAB — BASIC METABOLIC PANEL
Anion gap: 9 (ref 5–15)
BUN: 15 mg/dL (ref 8–23)
CO2: 20 mmol/L — ABNORMAL LOW (ref 22–32)
Calcium: 9.9 mg/dL (ref 8.9–10.3)
Chloride: 104 mmol/L (ref 98–111)
Creatinine, Ser: 0.8 mg/dL (ref 0.44–1.00)
GFR, Estimated: >60 mL/min (ref >60)
Glucose, Bld: 133 mg/dL — ABNORMAL HIGH (ref 70–99)
Potassium: 4.2 mmol/L (ref 3.5–5.1)
Sodium: 133 mmol/L — ABNORMAL LOW (ref 135–145)

## 2020-08-25 LAB — CBC WITH DIFFERENTIAL/PLATELET
Abs Immature Granulocytes: 0.3 10*3/uL — ABNORMAL HIGH (ref 0.00–0.07)
Basophils Absolute: 0 10*3/uL (ref 0.0–0.1)
Basophils Relative: 0 %
Eosinophils Absolute: 0 10*3/uL (ref 0.0–0.5)
Eosinophils Relative: 0 %
HCT: 36.6 % (ref 36.0–46.0)
Hemoglobin: 12.6 g/dL (ref 12.0–15.0)
Immature Granulocytes: 2 %
Lymphocytes Relative: 7 %
Lymphs Abs: 1 10*3/uL (ref 0.7–4.0)
MCH: 31.8 pg (ref 26.0–34.0)
MCHC: 34.4 g/dL (ref 30.0–36.0)
MCV: 92.4 fL (ref 80.0–100.0)
Monocytes Absolute: 1.1 10*3/uL — ABNORMAL HIGH (ref 0.1–1.0)
Monocytes Relative: 8 %
Neutro Abs: 11.3 10*3/uL — ABNORMAL HIGH (ref 1.7–7.7)
Neutrophils Relative %: 83 %
Platelets: 253 10*3/uL (ref 150–400)
RBC: 3.96 MIL/uL (ref 3.87–5.11)
RDW: 13.2 % (ref 11.5–15.5)
WBC: 13.7 10*3/uL — ABNORMAL HIGH (ref 4.0–10.5)
nRBC: 0 % (ref 0.0–0.2)

## 2020-08-25 LAB — MAGNESIUM: Magnesium: 1.8 mg/dL (ref 1.7–2.4)

## 2020-08-25 MED ORDER — GADOBUTROL 1 MMOL/ML IV SOLN
7.0000 mL | Freq: Once | INTRAVENOUS | Status: AC | PRN
  Administered 2020-08-25: 7 mL via INTRAVENOUS

## 2020-08-25 MED ORDER — IOHEXOL 350 MG/ML SOLN
95.0000 mL | Freq: Once | INTRAVENOUS | Status: AC | PRN
  Administered 2020-08-25: 95 mL via INTRAVENOUS

## 2020-08-25 MED ORDER — HYDROCODONE-ACETAMINOPHEN 5-325 MG PO TABS
1.0000 | ORAL_TABLET | Freq: Four times a day (QID) | ORAL | Status: DC | PRN
Start: 2020-08-25 — End: 2020-09-02
  Administered 2020-08-25 – 2020-09-02 (×7): 1 via ORAL
  Filled 2020-08-25 (×7): qty 1

## 2020-08-25 NOTE — Progress Notes (Signed)
Progress Note  Patient Name: Terri GoldenCarol L King Date of Encounter: 08/25/2020  [facility] Cardiologist: Dietrich PatesPaula Ross, MD   Subjective   [facility] Cath yesterday evening showed no significant CAD  AV was not crossed.  The plan is for the TAVR team to evaluate her today   She had some transient confusion immediately following the cath but it seems to have cleared today  No focal abnormalities on my simple neuro exam   Inpatient Medications    Scheduled Meds:  amLODipine  5 mg Oral Daily   aspirin EC  81 mg Oral Daily   enoxaparin (LOVENOX) injection  30 mg Subcutaneous Q24H   escitalopram  10 mg Oral Daily   famotidine  40 mg Oral Daily   gabapentin  100 mg Oral TID   metoprolol succinate  50 mg Oral Daily   potassium chloride SA  40 mEq Oral TID   QUEtiapine  25 mg Oral QHS   sodium chloride flush  3 mL Intravenous Q12H   sodium chloride flush  3 mL Intravenous Q12H   temazepam  15 mg Oral QHS   triamterene-hydrochlorothiazide  1 tablet Oral Daily   Continuous Infusions:  sodium chloride     PRN Meds: sodium chloride, ibuprofen, nitroGLYCERIN, ondansetron (ZOFRAN) IV, senna-docusate, sodium chloride flush   Vital Signs    Vitals:   08/24/20 2100 08/25/20 0000 08/25/20 0446 08/25/20 0826  BP: 122/87 (!) 128/42 131/64 (!) 149/49  Pulse: 79 76 83 78  Resp: 19 19 18 19   Temp:  98 F (36.7 C) 99.1 F (37.3 C) 98.7 F (37.1 C)  TempSrc:  Oral Oral Oral  SpO2: 99% 99% 100%   Weight:   70.7 kg   Height:        Intake/Output Summary (Last 24 hours) at 08/25/2020 0842 Last data filed at 08/24/2020 2300 Gross per 24 hour  Intake 1039.8 ml  Output 950 ml  Net 89.8 ml    Last 3 Weights 08/25/2020 08/20/2020 05/31/2020  Weight (lbs) 155 lb 13.8 oz 165 lb 11.2 oz 164 lb 12.8 oz  Weight (kg) 70.7 kg 75.161 kg 74.753 kg      Telemetry    NSR - Personally Reviewed  ECG    N/A  Physical Exam   Physical Exam: Blood pressure (!)  149/49, pulse 78, temperature 98.7 F (37.1 C), temperature source Oral, resp. rate 19, height 5\' 3"  (1.6 m), weight 70.7 kg, SpO2 100 %.  GEN:  elderly female,   moderate obesity  HEENT: Normal NECK: No JVD; No carotid bruits LYMPHATICS: No lymphadenopathy CARDIAC: RRR , 2-3/6 systolic murmur  RESPIRATORY:  Clear to auscultation without rales, wheezing or rhonchi  ABDOMEN: Soft, non-tender, non-distended MUSCULOSKELETAL:  No edema; No deformity  SKIN: Warm and dry NEUROLOGIC:  Alert and oriented x 3  Labs    Chemistry Recent Labs  Lab 08/22/20 0405 08/24/20 0331 08/24/20 1645 08/24/20 1740 08/24/20 1855 08/25/20 0332  NA 134* 140 136 136  137  --  133*  K 3.1* 4.5 4.2 4.0  3.9  --  4.2  CL 102 107  --   --   --  104  CO2 26 25  --   --   --  20*  GLUCOSE 121* 119*  --   --   --  133*  BUN 13 15  --   --   --  15  CREATININE 0.88 0.86  --   --  0.89 0.80  CALCIUM 9.4 9.8  --   --   --  9.9  GFRNONAA >60 >60  --   --  >60 >60  ANIONGAP 6 8  --   --   --  9      Hematology Recent Labs  Lab 08/24/20 0331 08/24/20 1645 08/24/20 1740 08/24/20 1855 08/25/20 0332  WBC 9.4  --   --  5.8 13.7*  RBC 3.94  --   --  6.58* 3.96  HGB 12.4   < > 12.9  12.9 20.6* 12.6  HCT 36.6   < > 38.0  38.0 59.8* 36.6  MCV 92.9  --   --  90.9 92.4  MCH 31.5  --   --  31.3 31.8  MCHC 33.9  --   --  34.4 34.4  RDW 13.4  --   --  13.2 13.2  PLT 228  --   --  139* 253   < > = values in this interval not displayed.     Radiology    MR ANGIO HEAD WO CONTRAST  Result Date: 08/25/2020 CLINICAL DATA:  Stroke follow-up EXAM: MRA HEAD WITHOUT CONTRAST TECHNIQUE: Angiographic images of the Circle of Willis were acquired using MRA technique without intravenous contrast. COMPARISON:  No pertinent prior exam. FINDINGS: POSTERIOR CIRCULATION: --Vertebral arteries: Normal --Inferior cerebellar arteries: Normal. --Basilar artery: Normal. --Superior cerebellar arteries: Normal. --Posterior  cerebral arteries: Normal. ANTERIOR CIRCULATION: --Intracranial internal carotid arteries: Normal. --Anterior cerebral arteries (ACA): Normal. --Middle cerebral arteries (MCA): Normal. ANATOMIC VARIANTS: None Other: None. IMPRESSION: Normal intracranial MRA. Electronically Signed   By: Deatra Robinson M.D.   On: 08/25/2020 02:42   MR ANGIO NECK W WO CONTRAST  Result Date: 08/25/2020 CLINICAL DATA:  Stroke follow-up EXAM: MRA NECK WITHOUT AND WITH CONTRAST TECHNIQUE: Multiplanar and multiecho pulse sequences of the neck were obtained without and with intravenous contrast. Angiographic images of the neck were obtained using MRA technique without and with intravenous contrast. CONTRAST:  38mL GADAVIST GADOBUTROL 1 MMOL/ML IV SOLN COMPARISON:  None. FINDINGS: Right carotid system is normal. 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 Vertebral arteries are right. There is moderate to severe stenosis of the left V4 segment. IMPRESSION: 1. 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 2. Moderate to severe stenosis of the left V4 segment. Electronically Signed   By: Deatra Robinson M.D.   On: 08/25/2020 02:48   MR BRAIN W WO CONTRAST  Result Date: 08/23/2020 CLINICAL DATA:  Neuro deficit, acute, stroke suspected. Additional history provided: Evaluate for possible CVA, recent fall, found down, patient with continued slow Nissen weakness on left side. EXAM: MRI HEAD WITHOUT AND WITH CONTRAST TECHNIQUE: Multiplanar, multiecho pulse sequences of the brain and surrounding structures were obtained without and with intravenous contrast. CONTRAST:  63mL GADAVIST GADOBUTROL 1 MMOL/ML IV SOLN COMPARISON:  Prior head CT examinations 08/19/2020 and earlier. FINDINGS: Brain: The examination is intermittently motion degraded, limiting evaluation. Most notably, there is moderate/severe motion degradation of the coronal T2 weighted sequence, moderate/severe motion degradation of the axial T1 weighted postcontrast sequence,  severe motion degradation of the coronal T1 weighted postcontrast sequence and moderate motion degradation of the sagittal T1 weighted postcontrast sequence. Moderate generalized cerebral and cerebellar atrophy. 5 mm focus of restricted diffusion within the left occipital lobe compatible with acute cortical infarct (series 5, image 72). Additional subtle acute cortical infarction changes are questioned more laterally within the left occipital lobe (versus artifact) (for instance as seen on  series 5, image 73). Moderate multifocal T2/FLAIR hyperintensity within the cerebral white matter, nonspecific but compatible with chronic small vessel ischemic disease. Tiny chronic lacunar infarcts within the left cerebellar hemisphere. No evidence of an intracranial mass. No chronic intracranial blood products are identified. No extra-axial fluid collection. No midline shift. Within described limitations, no pathologic intracranial enhancement is identified. Vascular: Expected proximal arterial flow voids. Skull and upper cervical spine: No focal marrow lesion. Incompletely assessed cervical spondylosis. Sinuses/Orbits: Visualized orbits show no acute finding. No significant paranasal sinus disease. IMPRESSION: Motion degraded examination, as described and limiting evaluation. 5 mm acute cortical infarct within the left occipital lobe. Additional subtle acute cortical infarction changes are questioned more laterally within the left occipital lobe (versus artifact). Moderate cerebral white matter chronic small vessel ischemic disease. Tiny chronic lacunar infarcts within the left cerebellar hemisphere. Moderate generalized parenchymal atrophy. Electronically Signed   By: Jackey Loge DO   On: 08/23/2020 13:10   CARDIAC CATHETERIZATION  Result Date: 08/24/2020  Normal Right Heart Cath Pressures-PAP 37/8 mmHg with a mean of 22 mmHg.  Prox RCA lesion is 20% stenosed.-Otherwise minimal if any CAD with very tortuous vessels.   SUMMARY  Documented essentially severe aortic stenosis on echocardiogram.  Aortic valve not crossed.  Angiographically normal coronary arteries but very tortuous consistent with hypertensive heart disease  Severe systemic hypertension with pressures ranging from 170 to 200 mmHg systolic.  Normal Right Heart Cath Pressures As Well As Cardiac Output and Index RECOMMENDATIONS  Continue evaluation for syncope, if considered to be related to aortic stenosis, would likely need to wait until leg healed.  Need more aggressive blood pressure management. Bryan Lemma, MD  VAS US CAROTID  Result Date: 08/24/2020 Carotid Arterial Duplex Study Patient Name:  SHAKEA ISIP  Date of Exam:   08/23/2020 Medical Rec #: [id number]        Accession #:    [id number] Date of Birth: September 05, 1934        Patient Gender: F Patient Age:   86Y Exam Location:  [facility] Procedure:      VAS US CAROTID Referring Phys: [id number] DAVID GIRGUIS --------------------------------------------------------------------------------  Indications:       Syncope. Risk Factors:      Hypertension, hyperlipidemia, no history of smoking, prior                    MI, coronary artery disease. Other Factors:     AO stenosis. Comparison Study:  No previous exams Performing Technologist: Jody Hill RVT, RDMS  Examination Guidelines: A complete evaluation includes B-mode imaging, spectral Doppler, color Doppler, and power Doppler as needed of all accessible portions of each vessel. Bilateral testing is considered an integral part of a complete examination. Limited examinations for reoccurring indications may be performed as noted.  Right Carotid Findings: +----------+--------+--------+--------+---------------------+------------------+           PSV cm/sEDV cm/sStenosisPlaque Description   Comments           +----------+--------+--------+--------+---------------------+------------------+ CCA Prox  78      0                                     intimal thickening +----------+--------+--------+--------+---------------------+------------------+ CCA Distal64      6               calcific, focal and  intimal thickening  irregular                               +----------+--------+--------+--------+---------------------+------------------+ ICA Prox  91      11      1-39%   calcific, smooth and                                                      focal                                   +----------+--------+--------+--------+---------------------+------------------+ ICA Mid                           calcific,                                                                 heterogenous and                                                          irregular                               +----------+--------+--------+--------+---------------------+------------------+ ICA Distal77      11                                                      +----------+--------+--------+--------+---------------------+------------------+ ECA       109     0                                                       +----------+--------+--------+--------+---------------------+------------------+ +----------+--------+-------+----------------+-------------------+           PSV cm/sEDV cmsDescribe        Arm Pressure (mmHG) +----------+--------+-------+----------------+-------------------+ [id number]            Multiphasic, WNL                    +----------+--------+-------+----------------+-------------------+ +---------+--------+--+--------+-+--------------+ VertebralPSV cm/s93EDV cm/s0High resistant +---------+--------+--+--------+-+--------------+  Left Carotid Findings: +----------+--------+--------+--------+---------------------+------------------+           PSV cm/sEDV cm/sStenosisPlaque Description   Comments            +----------+--------+--------+--------+---------------------+------------------+ CCA Prox  58      0  intimal thickening +----------+--------+--------+--------+---------------------+------------------+ CCA Distal58      0                                    intimal thickening +----------+--------+--------+--------+---------------------+------------------+ ICA Prox  77      12              focal and                                                                 heterogenous                            +----------+--------+--------+--------+---------------------+------------------+ ICA Distal71      13                                                      +----------+--------+--------+--------+---------------------+------------------+ ECA       63      0               smooth and calcific                     +----------+--------+--------+--------+---------------------+------------------+ +----------+--------+--------+----------------+-------------------+           PSV cm/sEDV cm/sDescribe        Arm Pressure (mmHG) +----------+--------+--------+----------------+-------------------+ Subclavian                Multiphasic, WNL                    +----------+--------+--------+----------------+-------------------+ +---------+--------+--+--------+-+--------------+ VertebralPSV cm/s30EDV cm/s0High resistant +---------+--------+--+--------+-+--------------+   Summary:   *See table(s) above for measurements and observations.  Electronically signed by Heath Lark on 08/24/2020 at 5:18:47 PM.    Final     Cardiac Studies   Echo 08/20/20 1. The aortic valve is calcified. There is severe calcifcation of the  aortic valve. There is severe thickening of the aortic valve. Aortic valve  regurgitation is mild. Severe aortic valve stenosis. AVA 0.9cm2, mean  gradient , peak gradient ,   Vmax 4.58m/s   2. Left ventricular  ejection fraction, by estimation, is 65 to 70%. Left  ventricular ejection fraction by 3D volume is 71 %. The left ventricle has  normal function. The left ventricle has no regional wall motion  abnormalities. There is moderate  asymmetric hypertrophy of the basal-septum. The rest of the LV segments  demonstrate mild left ventricular hypertrophy. Left ventricular diastolic  parameters are consistent with Grade I diastolic dysfunction (impaired  relaxation). Elevated left atrial  pressure. The average left ventricular global longitudinal strain is -20.2  %. The global longitudinal strain is normal.   3. Right ventricular systolic function is normal. The right ventricular  size is normal. There is severely elevated pulmonary artery systolic  pressure. The estimated right ventricular systolic pressure is 86.2 mmHg.   4. The mitral valve is abnormal. There is moderate thickening of the  mitral valve leaflet(s). There is moderate calcification of the mitral  valve leaflet(s). Moderate to severe mitral annular  calcification. Trivial  mitral valve regurgitation. Mild  calcific mitral stenosis with MVA 1.5cm2 by continuity, mean gradient  at HR 74bpm.   5. The inferior vena cava is dilated in size with >50% respiratory  variability, suggesting right atrial pressure of 8 mmHg.   Comparison(s): Compared to prior TTE in 03/2019, the aortic stenosis is  now severe (previously moderate) and there is severe pulmonary  hypertension with PASP .   Patient Profile     86 y.o. female with a hx of aortic stenosis, hypertension, hyperlipidemia and osteoarthritis who is being seen for the evaluation of syncope and aortic stenosis at the request of Dr. Frederick Peers. Found to have small stroke on MRI of brain.   Assessment & Plan    Syncope :  sounded c/w syncope from her AS.   Cannot rule out the possibility that her recent small CVA played some part. Further eval from neuro .    Aortic  stenosis Has no significant CAD by cath yesterday  For TAVR eval today   3. Acute stroke - MRI of brain with 5 mm acute cortical infarct within the left occipital lobe. Additional subtle acute cortical infarction changes are questioned more laterally within the left occipital lobe (versus artifact).  Neuro to see her today     Kristeen Miss, MD  08/25/2020 8:55 AM    [facility] [address] [place],  [address] [place], [place]  [address] Phone: [phone]; Fax: [phone]

## 2020-08-25 NOTE — Procedures (Signed)
Patient Name: Terri King  MRN: [id number]  Epilepsy Attending: Charlsie Quest  Referring Physician/Provider: Dr. Onalee Hua Date: 08/25/2020 Duration: 22.29 minutes  Patient history: 86 y.o. female who presented to the ED for evaluation of syncope.  EEG to evaluate for seizures.  Level of alertness: Awake  AEDs during EEG study: Gabapentin  Technical aspects: This EEG study was done with scalp electrodes positioned according to the 10-20 International system of electrode placement. Electrical activity was acquired at a sampling rate of 500Hz  and reviewed with a high frequency filter of 70Hz  and a low frequency filter of 1Hz . EEG data were recorded continuously and digitally stored.   Description: The posterior dominant rhythm consists of 7.5Hz  activity of moderate voltage (25-35 uV) seen predominantly in posterior head regions, symmetric and reactive to eye opening and eye closing. Hyperventilation and photic stimulation were not performed.     IMPRESSION: This study is within normal limits. No seizures or epileptiform discharges were seen throughout the recording.    Terri King 

## 2020-08-25 NOTE — Progress Notes (Signed)
Occupational Therapy Treatment Patient Details Name: Terri King MRN: [id number] DOB: April 04, 1934 Today's Date: 08/25/2020    History of present illness 86 year old female who was admitted after being found on the floor by family. patient was noted to have had syncopal episode.  found to have left lateral femoral condyle fracture, ortho was consulted, Dr.Lucey recommending knee immoblizer and touch down weightbearing. CT of head was negative. Patient transferred to [facility] 7/6. MRI + tiny left occipital subcortical infarct, MR angiogram of neck shows small left carotid web in MRA of the brain shows left V4 stenosis. PMH was significant for MI, OA, L TKA, CAD.   OT comments  Patient agreeable to chair transfer this session. Patient needing mod cues and overall min A for bed mobility for trunk support to pivot into position for anterior-posterior transfer. Patient mod A x2 with cues to use bilateral upper extremities to assist with scooting buttock back into chair. Patient needing rest breaks between scooting and cues for posture due to posterior bias. Patient did well and appreciative to be up to chair for lunch. Patient's son present at end of session, does not endorse any confusion in patient however did note patient repeatedly stating "but they won't let me get up because they don't want weight on that leg" educate patient on touchdown weight bearing restriction.    Follow Up Recommendations  SNF    Equipment Recommendations  Tub/shower bench;3 in 1 bedside commode       Precautions / Restrictions Precautions Precautions: Fall Required Braces or Orthoses: Knee Immobilizer - Left Knee Immobilizer - Left: On at all times Restrictions Weight Bearing Restrictions: Yes LLE Weight Bearing: Touchdown weight bearing       Mobility Bed Mobility Overal bed mobility: Needs Assistance             General bed mobility comments: with increased time patient is able to initiate bringing lower  extremities to side of bed in long sitting position in preparation for anterior/posterior transfer, overall min A for trunk support    Transfers Overall transfer level: Needs assistance   Transfers: Anterior-Posterior Transfer       Anterior-Posterior transfers: Mod assist;+2 physical assistance;+2 safety/equipment   General transfer comment: patient needing cues to use arms to assist with scooting hips back into chair. patient needing rest breaks between scoots and cues to maintain upright posture    Balance                                           ADL either performed or assessed with clinical judgement   ADL Overall ADL's : Needs assistance/impaired Eating/Feeding: Independent;Sitting                       Toilet Transfer: Anterior/posterior;Moderate assistance;+2 for physical assistance Toilet Transfer Details (indicate cue type and reason): into recliner chair, needing cues to use upper extremities to assist with lifting hips while therapy assist sliding buttock back into chair. patient needing rest breaks between scoots and cues to maintain upright posture due to posterior bias                  Cognition Arousal/Alertness: Awake/alert Behavior During Therapy: WFL for tasks assessed/performed Overall Cognitive Status: Within Functional Limits for tasks assessed  General Comments: son is in and did not comment on pt being confused                   Pertinent Vitals/ Pain       Pain Assessment: Faces Faces Pain Scale: Hurts little more Pain Location: L LE Pain Descriptors / Indicators: Grimacing Pain Intervention(s): Monitored during session         Frequency  Min 2X/week        Progress Toward Goals  OT Goals(current goals can now be found in the care plan section)  Progress towards OT goals: Progressing toward goals  Acute Rehab OT Goals Patient Stated Goal: patient  wants to be able to get back home at [facility] OT Goal Formulation: With patient Time For Goal Achievement: 09/05/20 Potential to Achieve Goals: Good ADL Goals Pt Will Perform Lower Body Dressing: with min assist Pt Will Transfer to Toilet: with min assist;stand pivot transfer;bedside commode Pt Will Perform Toileting - Clothing Manipulation and hygiene: sitting/lateral leans;with supervision  Plan Discharge plan remains appropriate    Co-evaluation    PT/OT/SLP Co-Evaluation/Treatment: Yes Reason for Co-Treatment: For patient/therapist safety;To address functional/ADL transfers PT goals addressed during session: Mobility/safety with mobility OT goals addressed during session: ADL's and self-care      AM-PAC OT "6 Clicks" Daily Activity     Outcome Measure   Help from another person eating meals?: None Help from another person taking care of personal grooming?: A Little Help from another person toileting, which includes using toliet, bedpan, or urinal?: A Lot Help from another person bathing (including washing, rinsing, drying)?: A Lot Help from another person to put on and taking off regular upper body clothing?: A Little Help from another person to put on and taking off regular lower body clothing?: A Lot 6 Click Score: 16    End of Session    OT Visit Diagnosis: Pain;Muscle weakness (generalized) (M62.81) Pain - Right/Left: Left Pain - part of body: Leg   Activity Tolerance Patient tolerated treatment well   Patient Left in chair;with call bell/phone within reach;with family/visitor present   Nurse Communication Mobility status        Time: 1344-1410 OT Time Calculation (min): 26 min  Charges: OT General Charges $OT Visit: 1 Visit OT Treatments $Self Care/Home Management : 8-22 mins  Marlyce Huge OT OT pager: [phone]   Carmelia Roller 08/25/2020, 2:30 PM

## 2020-08-25 NOTE — Progress Notes (Signed)
Physical Therapy Treatment Patient Details Name: Terri King MRN: [id number] DOB: 11-13-1934 Today's Date: 08/25/2020    History of Present Illness 86 year old female who was admitted after being found on the floor by family. patient was noted to have had syncopal episode.  found to have left lateral femoral condyle fracture, ortho was consulted, Dr.Lucey recommending knee immoblizer and touch down weightbearing. CT of head was negative. Patient transferred to [facility] 7/6. MRI + tiny left occipital subcortical infarct, MR angiogram of neck shows small left carotid web in MRA of the brain shows left V4 stenosis. PMH was significant for MI, OA, L TKA, CAD.    PT Comments    Pt was seen for co visit with OT to get her safely into chair with AP transfer, and was completed with son able to be there and observe.  Pt is requested to go to SNF, and is still appropriate due to her limited tolerance to move and the restrictions of her strength, brace on knee and her TDWB.  Follow along with her to increase safety and independence with tranfers as she can make improvements, and will work toward increased endurance and strength as is possible.   Follow Up Recommendations  SNF     Equipment Recommendations  None recommended by PT    Recommendations for Other Services       Precautions / Restrictions Precautions Precautions: Fall Required Braces or Orthoses: Knee Immobilizer - Left Knee Immobilizer - Left: On at all times Restrictions Weight Bearing Restrictions: Yes LLE Weight Bearing: Touchdown weight bearing    Mobility  Bed Mobility Overal bed mobility: Needs Assistance Bed Mobility: Supine to Sit     Supine to sit: Mod assist;+2 for physical assistance;+2 for safety/equipment     General bed mobility comments: with increased time patient is able to initiate bringing lower extremities to side of bed in long sitting position in preparation for anterior/posterior transfer, overall min A  for trunk support    Transfers Overall transfer level: Needs assistance Equipment used: 2 person hand held assist Transfers: Licensed conveyancer transfers: Mod assist;+2 physical assistance;+2 safety/equipment   General transfer comment: patient needing cues to use arms to assist with scooting hips back into chair. patient needing rest breaks between scoots and cues to maintain upright posture  Ambulation/Gait             General Gait Details: unable   Stairs             Wheelchair Mobility    Modified Rankin (Stroke Patients Only)       Balance Overall balance assessment: Needs assistance;History of Falls Sitting-balance support: Bilateral upper extremity supported (long sitting) Sitting balance-Leahy Scale: Fair Sitting balance - Comments: assisted at times with posterior sitting balance and then at times pt could support, not consistent                                    Cognition Arousal/Alertness: Awake/alert Behavior During Therapy: WFL for tasks assessed/performed Overall Cognitive Status: Within Functional Limits for tasks assessed                                 General Comments: son is in and did not comment on pt being confused      Exercises  General Comments General comments (skin integrity, edema, etc.): pt is assisted backward on the bed to scoot into recliner posteriorly, then cushioned legs with pillows.  Nursing was instructed on how to reassist back to bed      Pertinent Vitals/Pain Pain Assessment: Faces Faces Pain Scale: Hurts little more Pain Location: L LE Pain Descriptors / Indicators: Grimacing Pain Intervention(s): Monitored during session    Home Living                      Prior Function            PT Goals (current goals can now be found in the care plan section) Acute Rehab PT Goals Patient Stated Goal: patient wants to be able to get  back home at [facility] Progress towards PT goals: Progressing toward goals    Frequency    Min 3X/week      PT Plan Current plan remains appropriate    Co-evaluation   Reason for Co-Treatment: For patient/therapist safety;To address functional/ADL transfers PT goals addressed during session: Mobility/safety with mobility OT goals addressed during session: ADL's and self-care      AM-PAC PT "6 Clicks" Mobility   Outcome Measure  Help needed turning from your back to your side while in a flat bed without using bedrails?: A Lot Help needed moving from lying on your back to sitting on the side of a flat bed without using bedrails?: A Lot Help needed moving to and from a bed to a chair (including a wheelchair)?: A Lot Help needed standing up from a chair using your arms (e.g., wheelchair or bedside chair)?: Total Help needed to walk in hospital room?: Total Help needed climbing 3-5 steps with a railing? : Total 6 Click Score: 9    End of Session Equipment Utilized During Treatment: Gait belt;Right knee immobilizer Activity Tolerance: Patient tolerated treatment well Patient left: in chair;with call bell/phone within reach;with chair alarm set;with family/visitor present Nurse Communication: Mobility status PT Visit Diagnosis: Other abnormalities of gait and mobility (R26.89);Muscle weakness (generalized) (M62.81);History of falling (Z91.81);Unsteadiness on feet (R26.81)     Time: 1342-1410 PT Time Calculation (min) (ACUTE ONLY): 28 min  Charges:  $Therapeutic Activity: 8-22 mins            Terri King 08/25/2020, 4:07 PM  Terri King, PT MS Acute Rehab Dept. Number: [facility] [id number] and [facility] [phone]

## 2020-08-25 NOTE — NC FL2 (Signed)
[place] MEDICAID FL2 LEVEL OF CARE SCREENING TOOL     IDENTIFICATION  Patient Name: Terri King Birthdate: Nov 02, 1934 Sex: female Admission Date (Current Location): 08/19/2020  [facility] and [place] Number:  Producer, television/film/video and Address:  [facility], [address] [place], [place] [address]      Provider Number: [id number]  Attending Physician Name and Address:  Azucena Fallen, MD  Relative Name and Phone Number:  Shakeisha, Horine (Son)   [phone]    Current Level of Care: Hospital Recommended Level of Care: Skilled Nursing Facility Prior Approval Number:    Date Approved/Denied:   PASRR Number:    Discharge Plan: SNF    Current Diagnoses: Patient Active Problem List   Diagnosis Date Noted   Acute CVA (cerebrovascular accident) (HCC) 08/23/2020   Syncope 08/19/2020   Fracture of femoral condyle, closed (HCC) 08/19/2020   Aortic stenosis    Statin myopathy 04/08/2019   Well adult exam 08/07/2018   Hand weakness 08/07/2018   Gait disorder 08/07/2018   Headache 08/13/2017   Rash and nonspecific skin eruption 08/13/2017   Boil of buttock 05/09/2017   Shoulder pain, left 08/14/2016   Aortic stenosis, moderate 08/14/2016   Osteoporosis 11/01/2015   Cough 09/02/2015   Heart valve disease 06/14/2015   Insomnia 10/22/2013   UTI (urinary tract infection) 08/24/2013   Preop exam for internal medicine 01/16/2012   Ankle pain 07/11/2011   Adjustment disorder with mixed anxiety and depressed mood 03/28/2011   GERD (gastroesophageal reflux disease) 12/25/2010   Dysphagia 12/25/2010   Coronary atherosclerosis 02/09/2010   FEVER UNSPECIFIED 01/18/2010   CHEST PAIN 01/18/2010   Urinary incontinence 01/18/2010   BRONCHITIS NOT SPECIFIED AS ACUTE OR CHRONIC 12/05/2009   NEURALGIA, TRIGEMINAL 07/04/2009   EAR PAIN 07/04/2009   HYPOKALEMIA 02/17/2009   Arthralgia 02/17/2009   Hyperglycemia 09/02/2008   Osteoarthritis 03/11/2008    EDEMA 03/11/2008   KNEE PAIN 02/04/2008   CHEST XRAY, ABNORMAL 07/28/2007   Hyperlipidemia 03/17/2007   Essential hypertension 03/17/2007   UPPER RESPIRATORY INFECTION (URI) 03/17/2007   VAGINITIS 03/17/2007    Orientation RESPIRATION BLADDER Height & Weight     Self, Time, Situation, Place  Normal Continent, External catheter Weight: 155 lb 13.8 oz (70.7 kg) Height:  5\' 3"  (160 cm)  BEHAVIORAL SYMPTOMS/MOOD NEUROLOGICAL BOWEL NUTRITION STATUS      Continent Diet (See DC Summary)  AMBULATORY STATUS COMMUNICATION OF NEEDS Skin   Limited Assist Verbally Normal                       Personal Care Assistance Level of Assistance  Bathing, Feeding, Dressing Bathing Assistance: Limited assistance Feeding assistance: Independent Dressing Assistance: Limited assistance     Functional Limitations Info  Sight, Hearing, Speech Sight Info: Adequate Hearing Info: Adequate Speech Info: Adequate    SPECIAL CARE FACTORS FREQUENCY  PT (By licensed PT), OT (By licensed OT)     PT Frequency: 5x a week OT Frequency: 5x a week            Contractures Contractures Info: Not present    Additional Factors Info  Code Status, Allergies Code Status Info: Full Allergies Info: Codeine   Crestor (Rosuvastatin)   Dexilant (Dexlansoprazole)   Ibuprofen   Simvastatin   Tylenol (Acetaminophen)   Iodine           Current Medications (08/25/2020):  This is the current hospital active medication list Current Facility-Administered Medications  Medication Dose Route Frequency Provider Last Rate Last Admin   0.9 %  sodium chloride infusion  250 mL Intravenous PRN Marykay Lex, MD       amLODipine (NORVASC) tablet 5 mg  5 mg Oral Daily Marykay Lex, MD   5 mg at 08/25/20 3382   aspirin EC tablet 81 mg  81 mg Oral Daily Rejeana Brock, MD   81 mg at 08/25/20 5053   enoxaparin (LOVENOX) injection 30 mg  30 mg Subcutaneous Q24H Marykay Lex, MD   30 mg at 08/25/20 9767    escitalopram (LEXAPRO) tablet 10 mg  10 mg Oral Daily Marykay Lex, MD   10 mg at 08/25/20 0936   famotidine (PEPCID) tablet 40 mg  40 mg Oral Daily Marykay Lex, MD   40 mg at 08/25/20 3419   gabapentin (NEURONTIN) capsule 100 mg  100 mg Oral TID Marykay Lex, MD   100 mg at 08/25/20 3790   HYDROcodone-acetaminophen (NORCO/VICODIN) 5-325 MG per tablet 1 tablet  1 tablet Oral Q6H PRN Azucena Fallen, MD   1 tablet at 08/25/20 0936   ibuprofen (ADVIL) tablet 200 mg  200 mg Oral Q6H PRN Marykay Lex, MD   200 mg at 08/25/20 0507   metoprolol succinate (TOPROL-XL) 24 hr tablet 50 mg  50 mg Oral Daily Marykay Lex, MD   50 mg at 08/25/20 0936   nitroGLYCERIN (NITROSTAT) SL tablet 0.4 mg  0.4 mg Sublingual Q5 min PRN Marykay Lex, MD       ondansetron [facility]) injection 4 mg  4 mg Intravenous Q6H PRN Marykay Lex, MD       QUEtiapine (SEROQUEL) tablet 25 mg  25 mg Oral QHS Marykay Lex, MD   25 mg at 08/24/20 2146   senna-docusate (Senokot-S) tablet 1 tablet  1 tablet Oral QHS PRN Marykay Lex, MD       sodium chloride flush (NS) 0.9 % injection 3 mL  3 mL Intravenous Q12H Marykay Lex, MD   3 mL at 08/24/20 2156   sodium chloride flush (NS) 0.9 % injection 3 mL  3 mL Intravenous Q12H Marykay Lex, MD   3 mL at 08/24/20 2155   sodium chloride flush (NS) 0.9 % injection 3 mL  3 mL Intravenous PRN Marykay Lex, MD       temazepam (RESTORIL) capsule 15 mg  15 mg Oral QHS Marykay Lex, MD   15 mg at 08/24/20 2146   triamterene-hydrochlorothiazide (MAXZIDE-25) 37.5-25 MG per tablet 1 tablet  1 tablet Oral Daily Marykay Lex, MD   1 tablet at 08/24/20 2409     Discharge Medications: Please see discharge summary for a list of discharge medications.  Relevant Imaging Results:  Relevant Lab Results:   Additional Information SSN: [id number]; Moderna COVID-19 Vaccine 12/16/2019 , 05/20/2019 , 04/21/2019  Ivette Loyal, LCSWA

## 2020-08-25 NOTE — Progress Notes (Signed)
EEG complete - results pending 

## 2020-08-25 NOTE — TOC Initial Note (Addendum)
Transition of Care [facility]) - Initial/Assessment Note    Patient Details  Name: Terri King MRN: [id number] Date of Birth: September 12, 1934  [facility]) CM/SW Contact:    Ivette Loyal, Theresia Majors Phone Number: 08/25/2020, 12:48 PM  Clinical Narrative:                 1248: CSW spoke with pt and son, pt states that she has only been to [facility] and would like to go back there if there is any bed availability. CSW will contact [facility] for bed availability.   1310: CSW spoke with Tresa Endo at [facility], she does have a bed for pt available tomorrow. CSW will contact pt and family for update.  1320: CSW contacted family about DC to [place], Auth started by [employer] is not [employer].          Patient Goals and CMS Choice        Expected Discharge Plan and Services                                                Prior Living Arrangements/Services                       Activities of Daily Living Home Assistive Devices/Equipment: None ADL Screening (condition at time of admission) Patient's cognitive ability adequate to safely complete daily activities?: Yes Is the patient deaf or have difficulty hearing?: No Does the patient have difficulty seeing, even when wearing glasses/contacts?: No Does the patient have difficulty concentrating, remembering, or making decisions?: No Patient able to express need for assistance with ADLs?: Yes Does the patient have difficulty dressing or bathing?: Yes Independently performs ADLs?: No Communication: Independent Dressing (OT): Needs assistance Is this a change from baseline?: Change from baseline, expected to last <3days Grooming: Independent Feeding: Independent Bathing: Needs assistance Is this a change from baseline?: Change from baseline, expected to last <3 days Toileting: Needs assistance Is this a change from baseline?: Change from baseline, expected to last <3 days In/Out Bed: Needs  assistance Is this a change from baseline?: Change from baseline, expected to last <3 days Walks in Home: Needs assistance Is this a change from baseline?: Change from baseline, expected to last <3 days Does the patient have difficulty walking or climbing stairs?: Yes Weakness of Legs: Left Weakness of Arms/Hands: None  Permission Sought/Granted                  Emotional Assessment              Admission diagnosis:  Hypokalemia [E87.6] Syncope [R55] Closed nondisplaced fracture of condyle of left femur, initial encounter (HCC) [S72.415A] Syncope, unspecified syncope type [R55] Leukocytosis, unspecified type [D72.829] Patient Active Problem List   Diagnosis Date Noted   Acute CVA (cerebrovascular accident) (HCC) 08/23/2020   Syncope 08/19/2020   Fracture of femoral condyle, closed (HCC) 08/19/2020   Aortic stenosis    Statin myopathy 04/08/2019   Well adult exam 08/07/2018   Hand weakness 08/07/2018   Gait disorder 08/07/2018   Headache 08/13/2017   Rash and nonspecific skin eruption 08/13/2017   Boil of buttock 05/09/2017   Shoulder pain, left 08/14/2016   Aortic stenosis, moderate 08/14/2016   Osteoporosis 11/01/2015   Cough 09/02/2015   Heart valve disease 06/14/2015   Insomnia 10/22/2013  UTI (urinary tract infection) 08/24/2013   Preop exam for internal medicine 01/16/2012   Ankle pain 07/11/2011   Adjustment disorder with mixed anxiety and depressed mood 03/28/2011   GERD (gastroesophageal reflux disease) 12/25/2010   Dysphagia 12/25/2010   Coronary atherosclerosis 02/09/2010   FEVER UNSPECIFIED 01/18/2010   CHEST PAIN 01/18/2010   Urinary incontinence 01/18/2010   BRONCHITIS NOT SPECIFIED AS ACUTE OR CHRONIC 12/05/2009   NEURALGIA, TRIGEMINAL 07/04/2009   EAR PAIN 07/04/2009   HYPOKALEMIA 02/17/2009   Arthralgia 02/17/2009   Hyperglycemia 09/02/2008   Osteoarthritis 03/11/2008   EDEMA 03/11/2008   KNEE PAIN 02/04/2008   CHEST XRAY, ABNORMAL  07/28/2007   Hyperlipidemia 03/17/2007   Essential hypertension 03/17/2007   UPPER RESPIRATORY INFECTION (URI) 03/17/2007   VAGINITIS 03/17/2007   PCP:  Tresa Garter, MD Pharmacy:   [facility] (Now [facility]) - [place], [place] - [address] [place] [address] Phone: [phone] Fax: [phone]  [id number] - [place], [place] - [address] AT [facility] [address] [place] [address] Phone: [phone] Fax: [phone]     Social Determinants of Health (SDOH) Interventions    Readmission Risk Interventions No flowsheet data found.

## 2020-08-25 NOTE — Progress Notes (Signed)
Stroke Team Progress Note  SUBJECTIVE Patient is sitting up in bed.  Her son is at the bedside.  Patient denies any focal strokelike symptoms in the form of loss of peripheral vision, slurred speech, vertigo, diplopia focal extremity weakness or numbness.  The son admits that he has noticed slight cognitive impairment and confusion and disorientation last few days since she has been hospitalized and does admit that she may have having mild cognitive and memory difficulties at baseline.  She however has not had any previous stroke or TIA.  MRI scan of the brain shows a tiny left occipital subcortical infarct which is likely clinically silent.  MR angiogram study of the neck shows small left carotid web in MRA of the brain shows left V4 stenosis.  Patient presented with a syncopal event which probably is related to critical aortic stenosis.  Cardiology plan to have TAVR team evaluate for treatment OBJECTIVE Most recent Vital Signs: Temp: 98.7 F (37.1 C) (07/07 1140) Temp Source: Oral (07/07 1140) BP: 152/54 (07/07 1140) Pulse Rate: 76 (07/07 1140) Respiratory Rate: 20 O2 Saturdation: 100%  CBG (last 3)  No results for input(s): GLUCAP in the last 72 hours.     Studies:  CT head 08/19/2020 shows no acute abnormality. MRI brain 08/23/2020 shows tiny 5 mm left occipital cortical and subcortical infarct. MR angiogram of neck on 08/25/2020 shows small left carotid web and MR angiogram of the brain shows moderate to severe left V4 segment stenosis Carotid ultrasound 08/23/2020 shows no significant extracranial stenosis. ECHO 08/20/2020 shows severe calcification in the aortic valve with thickening and severe aortic stenosis with aortic valve area of 0.9 cm Right heart catheterization on 08/24/2020 shows severe aortic stenosis and aortic valve was not crossed.  Angiographically normal coronary arteries but very tortuous.  Normal right heart Pressure and cardiac output and index LDL 58 mg percent  HbA1c  5.2.  Physical Exam:   Frail elderly African-American lady not in distress.  Sitting up comfortably in bed. . Afebrile. Head is nontraumatic. Neck is supple without bruit.    Cardiac exam no murmur or gallop. Lungs are clear to auscultation. Distal pulses are well felt.  Neurological Exam : Awake alert oriented to time place and person.  Diminished attention, registration and recall.  Easy distractibility.  Poor recall.  Speech is clear without dysarthria or aphasia.  Follows simple midline and one-step commands.  Extraocular movements are full range without nystagmus.  Blinks to threat bilaterally.  Not cooperative for detailed visual field testing but able to see in both fields.  Face is symmetric without weakness tongue midline.  Hearing slightly diminished bilaterally.  Motor system exam shows symmetric upper extremity strength without focal weakness.  Symmetric lower extremity strength with mild weakness of hip flexors bilaterally.  No focal weakness.  Sensation intact bilaterally.  Gait not tested. ASSESSMENT Terri King is a 86 y.o. female with a , likely of silent incidental left occipital infarct which may be secondary to either embolization from heavily calcified aortic stenosis or known severe left V4 segment vertebral artery stenosis.  She has had some confusion and disorientation in the last few days this may be due to anesthesia complication of right heart catheterization or decompensation of baseline mild cognitive impairment during hospitalization.    Hospital day # 6  TREATMENT/PLAN I do long discussion with the patient and her son at the bedside regarding the sided occipital infarct in the recommend dual antiplatelet therapy of aspirin 81 mg and  Plavix for 3 months given her severe left vertebral artery stenosis which should be managed conservatively at the present time.  The patient may need TAVR and anticoagulation for short time during the procedure.  Given the risk benefits  of her recent syncope and critical aortic stenosis would be inclined to go ahead with the procedure sooner than later despite  a slight risk of bleeding into her silent stroke.  Discussed with Dr. Elease Hashimoto cardiology. I spent  35 minutes in total face-to-face time with the patient, more than 50% of which was spent in counseling and coordination of care, reviewing test results, reviewing medication and discussing or reviewing the diagnosis of aortic stenosis, occipital stroke and vertebral artery stenosis   , the prognosis and treatment options. Stroke team will sign off.  Kindly call for questions Delia Heady, MD Medical Director [facility] Stroke [facility] Pager: [phone] 08/25/2020 1:13 PM

## 2020-08-25 NOTE — Progress Notes (Signed)
PROGRESS NOTE    Terri King  INO:[id number] DOB: 08/27/1934 DOA: 08/19/2020 PCP: Tresa Garter, MD   Brief Narrative:  Terri King is a 86 y.o. female with medical history significant for HTN, moderate AS (per 03/2019 echo), CAD, hx of MI, CAD, OA who presented after being found on the floor at home by family. She reported she passed out and laid on the floor overnight. She does not remember what made her pass out and states she does not remember being dizzy or having chest pain or palpitations prior to her passing out. After the fall, she was unable to stand up or ambulate on her left leg.    X-rays revealed a left lateral condyle fracture of the femur. She was evaluated by orthopedic surgery and recommended for a knee immobilizer, touchdown weightbearing, and to follow-up outpatient with [facility] in approximately 2 weeks.   Further work-up was commenced regarding her syncope, falling, lethargy/weakness, and confusion as per family. She was found to be on multiple over sedating medications including temazepam, gabapentin, Seroquel. Medications have been weaned down/off over the past few days.   Repeat echo was also ordered and revealed progression of her aortic stenosis, now graded as severe.  MRI brain also obtained which showed a 5 mm acute cortical infarct within the left occipital lobe and tiny chronic lacunar infarcts within the left cerebellar hemisphere (after findings discussed with her sons bedside, they did endorse that she has had difficulty with balance as well which they attributed to her history of knee problems).   She was evaluated by cardiology at [facility] - having been transferred to [facility] for further TAVR evaluation and workup. Son updated at bedside.  Assessment & Plan:   Principal Problem:   Syncope Active Problems:   HYPOKALEMIA   Essential hypertension   Aortic stenosis   Fracture of femoral condyle, closed (HCC)   Acute CVA (cerebrovascular  accident) (HCC)  Severe AS - Progression from 03/2019 echo, now with severe AS; Given syncopal episode prior to admission, will ask cardiology to also evaluate - Plan is transfer to [facility] for further workup  - R/L heart cath 7/6 -unremarkable for significant CAD per report  Transient altered mental status - resolved;  Acute syncopal event prior to admission, no recurrent events - At this point, may be multifactorial in setting of polypharmacy (temazepam, gabapentin, Seroquel), recent CVA - EEG unremarkable - Continue to wean CNS depressants as tolerated.  So far temazepam from 30 mg qhs down to 15 mg qhs, reduced gaba from 300 mg TID to 100 mg TID, and reduced seroquel from BID to 25 mg qhs - tolerated wean so far without incident   Fracture of the left femoral condyle, closed - Status post orthopedics consultation recommended knee immobilizer and follow-up in the office in 2 weeks.   Acute CVA - Acute 5 mm left occipital infarct seen on MRI brain; also chronic infarcts in left cerebellum; less likely to be cardioembolic given unilateral distribution  - Neurology following, appreciate insight and recommendations - MRI shows tiny left occipital subcortical infarct, MRA shows small left carotid web with V4 stenosis - TSH (1.542), A1c (5.2 %), Lipid (LDL 83) - Further antiplatelet/anticoag regimen to be determined upon further workup and eval by neurology as well  Essential hypertension - Continue amlodipine and triamterene/HCTZ   Hypokalemia -Within normal limits, follow repeat labs  Leukocytosis, transient/resolved - likely reactive given above  DVT prophylaxis: Lovenox Code Status: Full Family Communication: Son at  bedside  Status is: Inpatient  Dispo:  The patient is from: Home              Anticipated d/c is to: SNF              Anticipated d/c date is: 24 to 48 hours pending cardiac evaluation and clearance              Patient currently not medically stable for  discharge  Consultants:  Cardiology, neurology, TAVR team  Procedures:  Cardiac cath Tentative TAVR evaluation for possible aortic repair  Antimicrobials:  None  Subjective: No acute issues or events overnight, patient resting comfortably denies nausea vomiting diarrhea constipation headache fevers chills or chest pain.  Objective: Vitals:   08/24/20 2100 08/25/20 0000 08/25/20 0446 08/25/20 0826  BP: 122/87 (!) 128/42 131/64 (!) 149/49  Pulse: 79 76 83 78  Resp: Temp:  98 F (36.7 C) 99.1 F (37.3 C) 98.7 F (37.1 C)  TempSrc:  Oral Oral Oral  SpO2: 99% 99% 100%   Weight:   70.7 kg   Height:        Intake/Output Summary (Last 24 hours) at 08/25/2020 0907 Last data filed at 08/24/2020 2300 Gross per 24 hour  Intake 1039.8 ml  Output 950 ml  Net 89.8 ml   Filed Weights   08/20/20 1109 08/25/20 0446  Weight: 75.2 kg 70.7 kg    Examination:  General exam: Appears calm and comfortable  Respiratory system: Clear to auscultation. Respiratory effort normal. Cardiovascular system: S1 & S2 heard, RRR. No JVD, murmurs, rubs, gallops or clicks. No pedal edema. Gastrointestinal system: Abdomen is nondistended, soft and nontender. No organomegaly or masses felt. Normal bowel sounds heard. Central nervous system: Alert and oriented. No focal neurological deficits. Extremities: Symmetric 5 x 5 power. Skin: No rashes, lesions or ulcers Psychiatry: Judgement and insight appear normal. Mood & affect appropriate.     Data Reviewed: I have personally reviewed following labs and imaging studies  CBC: Recent Labs  Lab 08/19/20 1700 08/20/20 0047 08/20/20 0400 08/22/20 0405 08/24/20 0331 08/24/20 1645 08/24/20 1740 08/24/20 1855 08/25/20 0332  WBC 16.1*   < > 17.8* 10.0 9.4  --   --  5.8 13.7*  NEUTROABS 14.5*  --   --  6.6 5.3  --   --   --  11.3*  HGB 12.2   < > 11.5* 11.7* 12.4 12.9 12.9  12.9 20.6* 12.6  HCT 34.9*   < > 33.3* 33.9* 36.6 38.0 38.0  38.0  59.8* 36.6  MCV 90.4   < > 90.5 92.1 92.9  --   --  90.9 92.4  PLT 205   < > 180 194 228  --   --  139* 253   < > = values in this interval not displayed.   Basic Metabolic Panel: Recent Labs  Lab 08/19/20 1841 08/20/20 0047 08/20/20 0400 08/22/20 0405 08/24/20 0331 08/24/20 1645 08/24/20 1740 08/24/20 1855 08/25/20 0332  NA  --  137 135 134* 140 136 136  137  --  133*  K  --  2.7* 3.4* 3.1* 4.5 4.2 4.0  3.9  --  4.2  CL  --  106 106 102 107  --   --   --  104  CO2  --  --   --   --  20*  GLUCOSE  --  152* 136* 121* 119*  --   --   --  133*  BUN  --  9 8 13 15   --   --   --  15  CREATININE  --  0.85 0.83  0.70 0.88 0.86  --   --  0.89 0.80  CALCIUM  --  8.8* 9.0 9.4 9.8  --   --   --  9.9  MG 2.0  --   --  1.8 1.8  --   --   --  1.8   GFR: Estimated Creatinine Clearance: 49.3 mL/min (by C-G formula based on SCr of 0.8 mg/dL). Liver Function Tests: No results for input(s): AST, ALT, ALKPHOS, BILITOT, PROT, ALBUMIN in the last 168 hours. No results for input(s): LIPASE, AMYLASE in the last 168 hours. No results for input(s): AMMONIA in the last 168 hours. Coagulation Profile: No results for input(s): INR, PROTIME in the last 168 hours. Cardiac Enzymes: Recent Labs  Lab 08/19/20 1700  CKTOTAL 155   BNP (last 3 results) No results for input(s): PROBNP in the last 8760 hours. HbA1C: Recent Labs    08/23/20 1427  HGBA1C 5.2   CBG: No results for input(s): GLUCAP in the last 168 hours. Lipid Profile: Recent Labs    08/23/20 1427  CHOL 153  HDL 41  LDLCALC 83  TRIG 144  CHOLHDL 3.7   Thyroid Function Tests: Recent Labs    08/23/20 1427  TSH 1.542   Anemia Panel: No results for input(s): VITAMINB12, FOLATE, FERRITIN, TIBC, IRON, RETICCTPCT in the last 72 hours. Sepsis Labs: No results for input(s): PROCALCITON, LATICACIDVEN in the last 168 hours.  Recent Results (from the past 240 hour(s))  SARS CORONAVIRUS 2 (TAT 6-24 HRS)  Nasopharyngeal Nasopharyngeal Swab     Status: None   Collection Time: 08/20/20  6:31 AM   Specimen: Nasopharyngeal Swab  Result Value Ref Range Status   SARS Coronavirus 2 NEGATIVE NEGATIVE Final    Comment: (NOTE) SARS-CoV-2 target nucleic acids are NOT DETECTED.  The SARS-CoV-2 RNA is generally detectable in upper and lower respiratory specimens during the acute phase of infection. Negative results do not preclude SARS-CoV-2 infection, do not rule out co-infections with other pathogens, and should not be used as the sole basis for treatment or other patient management decisions. Negative results must be combined with clinical observations, patient history, and epidemiological information. The expected result is Negative.  Fact Sheet for Patients: 10/21/20  Fact Sheet for Healthcare Providers: HairSlick.no  This test is not yet approved or cleared by the quierodirigir.com FDA and  has been authorized for detection and/or diagnosis of SARS-CoV-2 by FDA under an Emergency Use Authorization (EUA). This EUA will remain  in effect (meaning this test can be used) for the duration of the COVID-19 declaration under Se ction 564(b)(1) of the Act, 21 U.S.C. section 360bbb-3(b)(1), unless the authorization is terminated or revoked sooner.  Performed at [facility], [address] [place], [place]          Radiology Studies: MR ANGIO HEAD WO CONTRAST  Result Date: 08/25/2020 CLINICAL DATA:  Stroke follow-up EXAM: MRA HEAD WITHOUT CONTRAST TECHNIQUE: Angiographic images of the Circle of Willis were acquired using MRA technique without intravenous contrast. COMPARISON:  No pertinent prior exam. FINDINGS: POSTERIOR CIRCULATION: --Vertebral arteries: Normal --Inferior cerebellar arteries: Normal. --Basilar artery: Normal. --Superior cerebellar arteries: Normal. --Posterior cerebral arteries: Normal. ANTERIOR  CIRCULATION: --Intracranial internal carotid arteries: Normal. --Anterior cerebral arteries (ACA): Normal. --Middle cerebral arteries (MCA): Normal. ANATOMIC VARIANTS: None Other: None. IMPRESSION: Normal intracranial  MRA. Electronically Signed   By: Deatra RobinsonKevin  Herman M.D.   On: 08/25/2020 02:42   MR ANGIO NECK W WO CONTRAST  Result Date: 08/25/2020 CLINICAL DATA:  Stroke follow-up EXAM: MRA NECK WITHOUT AND WITH CONTRAST TECHNIQUE: Multiplanar and multiecho pulse sequences of the neck were obtained without and with intravenous contrast. Angiographic images of the neck were obtained using MRA technique without and with intravenous contrast. CONTRAST:  7mL GADAVIST GADOBUTROL 1 MMOL/ML IV SOLN COMPARISON:  None. FINDINGS: Right carotid system is normal. 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 Vertebral arteries are right. There is moderate to severe stenosis of the left V4 segment. IMPRESSION: 1. 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 2. Moderate to severe stenosis of the left V4 segment. Electronically Signed   By: Deatra RobinsonKevin  Herman M.D.   On: 08/25/2020 02:48   MR BRAIN W WO CONTRAST  Result Date: 08/23/2020 CLINICAL DATA:  Neuro deficit, acute, stroke suspected. Additional history provided: Evaluate for possible CVA, recent fall, found down, patient with continued slow Nissen weakness on left side. EXAM: MRI HEAD WITHOUT AND WITH CONTRAST TECHNIQUE: Multiplanar, multiecho pulse sequences of the brain and surrounding structures were obtained without and with intravenous contrast. CONTRAST:  7mL GADAVIST GADOBUTROL 1 MMOL/ML IV SOLN COMPARISON:  Prior head CT examinations 08/19/2020 and earlier. FINDINGS: Brain: The examination is intermittently motion degraded, limiting evaluation. Most notably, there is moderate/severe motion degradation of the coronal T2 weighted sequence, moderate/severe motion degradation of the axial T1 weighted postcontrast sequence, severe motion degradation of the  coronal T1 weighted postcontrast sequence and moderate motion degradation of the sagittal T1 weighted postcontrast sequence. Moderate generalized cerebral and cerebellar atrophy. 5 mm focus of restricted diffusion within the left occipital lobe compatible with acute cortical infarct (series 5, image 72). Additional subtle acute cortical infarction changes are questioned more laterally within the left occipital lobe (versus artifact) (for instance as seen on series 5, image 73). Moderate multifocal T2/FLAIR hyperintensity within the cerebral white matter, nonspecific but compatible with chronic small vessel ischemic disease. Tiny chronic lacunar infarcts within the left cerebellar hemisphere. No evidence of an intracranial mass. No chronic intracranial blood products are identified. No extra-axial fluid collection. No midline shift. Within described limitations, no pathologic intracranial enhancement is identified. Vascular: Expected proximal arterial flow voids. Skull and upper cervical spine: No focal marrow lesion. Incompletely assessed cervical spondylosis. Sinuses/Orbits: Visualized orbits show no acute finding. No significant paranasal sinus disease. IMPRESSION: Motion degraded examination, as described and limiting evaluation. 5 mm acute cortical infarct within the left occipital lobe. Additional subtle acute cortical infarction changes are questioned more laterally within the left occipital lobe (versus artifact). Moderate cerebral white matter chronic small vessel ischemic disease. Tiny chronic lacunar infarcts within the left cerebellar hemisphere. Moderate generalized parenchymal atrophy. Electronically Signed   By: Jackey LogeKyle  Golden DO   On: 08/23/2020 13:10   CARDIAC CATHETERIZATION  Result Date: 08/24/2020  Normal Right Heart Cath Pressures-PAP 37/8 mmHg with a mean of 22 mmHg.  Prox RCA lesion is 20% stenosed.-Otherwise minimal if any CAD with very tortuous vessels.  SUMMARY  Documented essentially  severe aortic stenosis on echocardiogram.  Aortic valve not crossed.  Angiographically normal coronary arteries but very tortuous consistent with hypertensive heart disease  Severe systemic hypertension with pressures ranging from 170 to 200 mmHg systolic.  Normal Right Heart Cath Pressures As Well As Cardiac Output and Index RECOMMENDATIONS  Continue evaluation for syncope, if considered to be related to aortic stenosis, would  likely need to wait until leg healed.  Need more aggressive blood pressure management. Bryan Lemma, MD  VAS US CAROTID  Result Date: 08/24/2020 Carotid Arterial Duplex Study Patient Name:  OLUWATONI ROTUNNO  Date of Exam:   08/23/2020 Medical Rec #: [id number]        Accession #:    [id number] Date of Birth: 12/23/1934        Patient Gender: F Patient Age:   86Y Exam Location:  [facility] Procedure:      VAS US CAROTID Referring Phys: [id number] DAVID GIRGUIS --------------------------------------------------------------------------------  Indications:       Syncope. Risk Factors:      Hypertension, hyperlipidemia, no history of smoking, prior                    MI, coronary artery disease. Other Factors:     AO stenosis. Comparison Study:  No previous exams Performing Technologist: Jody Hill RVT, RDMS  Examination Guidelines: A complete evaluation includes B-mode imaging, spectral Doppler, color Doppler, and power Doppler as needed of all accessible portions of each vessel. Bilateral testing is considered an integral part of a complete examination. Limited examinations for reoccurring indications may be performed as noted.  Right Carotid Findings: +----------+--------+--------+--------+---------------------+------------------+           PSV cm/sEDV cm/sStenosisPlaque Description   Comments           +----------+--------+--------+--------+---------------------+------------------+ CCA Prox  78      0                                    intimal thickening  +----------+--------+--------+--------+---------------------+------------------+ CCA Distal64      6               calcific, focal and  intimal thickening                                   irregular                               +----------+--------+--------+--------+---------------------+------------------+ ICA Prox  91      11      1-39%   calcific, smooth and                                                      focal                                   +----------+--------+--------+--------+---------------------+------------------+ ICA Mid                           calcific,                                                                 heterogenous and  irregular                               +----------+--------+--------+--------+---------------------+------------------+ ICA Distal77      11                                                      +----------+--------+--------+--------+---------------------+------------------+ ECA       109     0                                                       +----------+--------+--------+--------+---------------------+------------------+ +----------+--------+-------+----------------+-------------------+           PSV cm/sEDV cmsDescribe        Arm Pressure (mmHG) +----------+--------+-------+----------------+-------------------+ [id number]            Multiphasic, WNL                    +----------+--------+-------+----------------+-------------------+ +---------+--------+--+--------+-+--------------+ VertebralPSV cm/s93EDV cm/s0High resistant +---------+--------+--+--------+-+--------------+  Left Carotid Findings: +----------+--------+--------+--------+---------------------+------------------+           PSV cm/sEDV cm/sStenosisPlaque Description   Comments            +----------+--------+--------+--------+---------------------+------------------+ CCA Prox  58      0                                    intimal thickening +----------+--------+--------+--------+---------------------+------------------+ CCA Distal58      0                                    intimal thickening +----------+--------+--------+--------+---------------------+------------------+ ICA Prox  77      12              focal and                                                                 heterogenous                            +----------+--------+--------+--------+---------------------+------------------+ ICA Distal71      13                                                      +----------+--------+--------+--------+---------------------+------------------+ ECA       63      0               smooth and calcific                     +----------+--------+--------+--------+---------------------+------------------+ +----------+--------+--------+----------------+-------------------+           PSV cm/sEDV  cm/sDescribe        Arm Pressure (mmHG) +----------+--------+--------+----------------+-------------------+ Subclavian                Multiphasic, WNL                    +----------+--------+--------+----------------+-------------------+ +---------+--------+--+--------+-+--------------+ VertebralPSV cm/s30EDV cm/s0High resistant +---------+--------+--+--------+-+--------------+   Summary:   *See table(s) above for measurements and observations.  Electronically signed by Heath Lark on 08/24/2020 at 5:18:47 PM.    Final         Scheduled Meds:  amLODipine  5 mg Oral Daily   aspirin EC  81 mg Oral Daily   enoxaparin (LOVENOX) injection  30 mg Subcutaneous Q24H   escitalopram  10 mg Oral Daily   famotidine  40 mg Oral Daily   gabapentin  100 mg Oral TID   metoprolol succinate  50 mg Oral Daily   potassium chloride SA  40 mEq Oral TID    QUEtiapine  25 mg Oral QHS   sodium chloride flush  3 mL Intravenous Q12H   sodium chloride flush  3 mL Intravenous Q12H   temazepam  15 mg Oral QHS   triamterene-hydrochlorothiazide  1 tablet Oral Daily   Continuous Infusions:  sodium chloride      LOS: 6 days   Time spent: 40 min  Azucena Fallen, DO [facility]  If 7PM-7AM, please contact night-coverage [email or web address]  08/25/2020, 9:07 AM

## 2020-08-26 ENCOUNTER — Encounter: Payer: Self-pay | Admitting: Physician Assistant

## 2020-08-26 ENCOUNTER — Encounter (HOSPITAL_COMMUNITY): Payer: Self-pay | Admitting: Family Medicine

## 2020-08-26 DIAGNOSIS — I350 Nonrheumatic aortic (valve) stenosis: Secondary | ICD-10-CM | POA: Diagnosis not present

## 2020-08-26 DIAGNOSIS — I10 Essential (primary) hypertension: Secondary | ICD-10-CM | POA: Diagnosis not present

## 2020-08-26 LAB — CBC WITH DIFFERENTIAL/PLATELET
Abs Immature Granulocytes: 0.14 10*3/uL — ABNORMAL HIGH (ref 0.00–0.07)
Basophils Absolute: 0 10*3/uL (ref 0.0–0.1)
Basophils Relative: 0 %
Eosinophils Absolute: 0.2 10*3/uL (ref 0.0–0.5)
Eosinophils Relative: 2 %
HCT: 37.6 % (ref 36.0–46.0)
Hemoglobin: 12.8 g/dL (ref 12.0–15.0)
Immature Granulocytes: 1 %
Lymphocytes Relative: 21 %
Lymphs Abs: 2.1 10*3/uL (ref 0.7–4.0)
MCH: 31.8 pg (ref 26.0–34.0)
MCHC: 34 g/dL (ref 30.0–36.0)
MCV: 93.3 fL (ref 80.0–100.0)
Monocytes Absolute: 1.4 10*3/uL — ABNORMAL HIGH (ref 0.1–1.0)
Monocytes Relative: 14 %
Neutro Abs: 6.3 10*3/uL (ref 1.7–7.7)
Neutrophils Relative %: 62 %
Platelets: 170 10*3/uL (ref 150–400)
RBC: 4.03 MIL/uL (ref 3.87–5.11)
RDW: 13.7 % (ref 11.5–15.5)
WBC: 10.2 10*3/uL (ref 4.0–10.5)
nRBC: 0 % (ref 0.0–0.2)

## 2020-08-26 LAB — BASIC METABOLIC PANEL
Anion gap: 9 (ref 5–15)
BUN: 14 mg/dL (ref 8–23)
CO2: 21 mmol/L — ABNORMAL LOW (ref 22–32)
Calcium: 9.8 mg/dL (ref 8.9–10.3)
Chloride: 104 mmol/L (ref 98–111)
Creatinine, Ser: 0.85 mg/dL (ref 0.44–1.00)
GFR, Estimated: >60 mL/min (ref >60)
Glucose, Bld: 109 mg/dL — ABNORMAL HIGH (ref 70–99)
Potassium: 3.9 mmol/L (ref 3.5–5.1)
Sodium: 134 mmol/L — ABNORMAL LOW (ref 135–145)

## 2020-08-26 LAB — MAGNESIUM: Magnesium: 2 mg/dL (ref 1.7–2.4)

## 2020-08-26 NOTE — Progress Notes (Signed)
Physical Therapy Treatment Patient Details Name: Terri King MRN: [id number] DOB: [birth year] Today's Date: 08/26/2020    History of Present Illness 86 year old female who was admitted after being found on the floor by family. patient was noted to have had syncopal episode.  found to have left lateral femoral condyle fracture, ortho was consulted, Dr.Lucey recommending knee immoblizer and touch down weightbearing. CT of head was negative. Patient transferred to [facility] 7/6. MRI + tiny left occipital subcortical infarct, MR angiogram of neck shows small left carotid web in MRA of the brain shows left V4 stenosis. PMH was significant for MI, OA, L TKA, CAD.    PT Comments    Pt is in bed eating when PT first arrived and upon return declines to get up again.  Pt is able to review exercise on LE's in the L knee immob with all precautions observed.  Follow along with her to continue to work on mobility, focus on standing attempts and progressing transfer skills to assist with her completion of rehab in SNF.  See for acute PT goals as outlined.   Follow Up Recommendations  SNF     Equipment Recommendations  None recommended by PT    Recommendations for Other Services       Precautions / Restrictions Precautions Precautions: Fall Required Braces or Orthoses: Knee Immobilizer - Left Knee Immobilizer - Left: On at all times Restrictions Weight Bearing Restrictions: Yes LLE Weight Bearing: Touchdown weight bearing    Mobility  Bed Mobility Overal bed mobility: Needs Assistance             General bed mobility comments: declines to move OOB    Transfers Overall transfer level: Needs assistance               General transfer comment: declined  Ambulation/Gait                 Stairs             Wheelchair Mobility    Modified Rankin (Stroke Patients Only)       Balance Overall balance assessment: Needs assistance                                           Cognition Arousal/Alertness: Awake/alert Behavior During Therapy: WFL for tasks assessed/performed Overall Cognitive Status: Within Functional Limits for tasks assessed                                        Exercises General Exercises - Lower Extremity Ankle Circles/Pumps: AAROM;5 reps Gluteal Sets: AROM;10 reps Heel Slides: AROM;10 reps;Right Hip ABduction/ADduction: AROM;AAROM;10 reps;Both Straight Leg Raises: AAROM;Both;10 reps    General Comments        Pertinent Vitals/Pain Pain Assessment: Faces Faces Pain Scale: Hurts even more Pain Location: L LE Pain Descriptors / Indicators: Grimacing;Guarding Pain Intervention(s): Limited activity within patient's tolerance;Premedicated before session;Repositioned    Home Living                      Prior Function            PT Goals (current goals can now be found in the care plan section) Acute Rehab PT Goals Patient Stated Goal: patient wants to be able to get back  home at [facility] Progress towards PT goals: Not progressing toward goals - comment    Frequency    Min 3X/week      PT Plan Current plan remains appropriate    Co-evaluation              AM-PAC PT "6 Clicks" Mobility   Outcome Measure  Help needed turning from your back to your side while in a flat bed without using bedrails?: A Lot Help needed moving from lying on your back to sitting on the side of a flat bed without using bedrails?: A Lot Help needed moving to and from a bed to a chair (including a wheelchair)?: A Lot Help needed standing up from a chair using your arms (e.g., wheelchair or bedside chair)?: Total Help needed to walk in hospital room?: Total Help needed climbing 3-5 steps with a railing? : Total 6 Click Score: 9    End of Session Equipment Utilized During Treatment: Left knee immobilizer Activity Tolerance: Patient limited by pain Patient left: in bed;with call bell/phone within  reach;with bed alarm set Nurse Communication: Mobility status PT Visit Diagnosis: Pain;Muscle weakness (generalized) (M62.81);History of falling (Z91.81) Pain - Right/Left: Left Pain - part of body: Knee     Time: 1610-9604 PT Time Calculation (min) (ACUTE ONLY): 25 min  Charges:  $Therapeutic Exercise: 23-37 mins Ivar Drape 08/26/2020, 3:58 PM  Samul Dada, PT MS Acute Rehab Dept. Number: [facility] [id number] and [facility] [phone]

## 2020-08-26 NOTE — Progress Notes (Signed)
PROGRESS NOTE    Terri King  ZYY:[id number] DOB: [birth year] DOA: 08/19/2020 PCP: Tresa Garter, MD   Brief Narrative:  Terri King is a 86 y.o. female with medical history significant for HTN, moderate AS (per 03/2019 echo), CAD, hx of MI, CAD, OA who presented after being found on the floor at home by family. She reported she passed out and laid on the floor overnight. She does not remember what made her pass out and states she does not remember being dizzy or having chest pain or palpitations prior to her passing out. After the fall, she was unable to stand up or ambulate on her left leg.    X-rays revealed a left lateral condyle fracture of the femur. She was evaluated by orthopedic surgery and recommended for a knee immobilizer, touchdown weightbearing, and to follow-up outpatient with [facility] in approximately 2 weeks.   Further work-up was commenced regarding her syncope, falling, lethargy/weakness, and confusion as per family. She was found to be on multiple over sedating medications including temazepam, gabapentin, Seroquel. Medications have been weaned down/off over the past few days.   Repeat echo was also ordered and revealed progression of her aortic stenosis, now graded as severe.  MRI brain also obtained which showed a 5 mm acute cortical infarct within the left occipital lobe and tiny chronic lacunar infarcts within the left cerebellar hemisphere (after findings discussed with her sons bedside, they did endorse that she has had difficulty with balance as well which they attributed to her history of knee problems).   She was evaluated by cardiology at [facility] - having been transferred to [facility] for further TAVR evaluation and workup. Son updated at bedside.  Assessment & Plan:   Principal Problem:   Syncope Active Problems:   HYPOKALEMIA   Essential hypertension   Aortic stenosis   Fracture of femoral condyle, closed (HCC)   Acute CVA (cerebrovascular  accident) (HCC)  Severe AS - Progression from 03/2019 echo, now with severe AS; Given syncopal episode prior to admission  - Plan for TAVR team to evaluate - R/L heart cath 7/6 -unremarkable for significant CAD per report  Transient altered mental status - resolved;  Acute syncopal event prior to admission, no recurrent events - At this point, may be multifactorial in setting of polypharmacy (temazepam, gabapentin, Seroquel), recent CVA - EEG unremarkable - Continue to wean CNS depressants as tolerated.  So far temazepam from 30 mg qhs down to 15 mg qhs, reduced gaba from 300 mg TID to 100 mg TID, and reduced seroquel from BID to 25 mg qhs - tolerated wean so far without incident   Fracture of the left femoral condyle, closed - Status post orthopedics consultation recommended knee immobilizer and follow-up in the office in 2 weeks.   Acute CVA - Acute 5 mm left occipital infarct seen on MRI brain; also chronic infarcts in left cerebellum; less likely to be cardioembolic given unilateral distribution  - Neurology following, appreciate insight and recommendations - MRI shows tiny left occipital subcortical infarct, MRA shows small left carotid web with V4 stenosis - TSH (1.542), A1c (5.2 %), Lipid (LDL 83) - Further antiplatelet/anticoag regimen to be determined upon further workup and eval by neurology as well  Essential hypertension - Continue amlodipine and triamterene/HCTZ   Hypokalemia -Within normal limits, follow repeat labs  Leukocytosis, transient/resolved - likely reactive given above  DVT prophylaxis: Lovenox Code Status: Full Family Communication: Son at bedside  Status is: Inpatient  Dispo:  The patient is from: Home              Anticipated d/c is to: SNF              Anticipated d/c date is: 24 to 48 hours pending cardiac evaluation and clearance              Patient currently not medically stable for discharge  Consultants:  Cardiology, neurology, TAVR  team  Procedures:  Cardiac cath Tentative TAVR evaluation for possible aortic repair  Antimicrobials:  None  Subjective: No acute issues or events overnight, patient resting comfortably denies nausea vomiting diarrhea constipation headache fevers chills or chest pain.  Objective: Vitals:   08/25/20 2259 08/26/20 0020 08/26/20 0446 08/26/20 0727  BP: (!) 169/68 (!) 146/53 (!) 194/56 (!) 175/62  Pulse: 73 73 78 70  Resp:   18 18  Temp:   98 F (36.7 C) 97.9 F (36.6 C)  TempSrc:   Oral Oral  SpO2:   100%   Weight:   70.7 kg   Height:        Intake/Output Summary (Last 24 hours) at 08/26/2020 0803 Last data filed at 08/26/2020 0400 Gross per 24 hour  Intake 340 ml  Output 850 ml  Net -510 ml    Filed Weights   08/20/20 1109 08/25/20 0446 08/26/20 0446  Weight: 75.2 kg 70.7 kg 70.7 kg    Examination:  General exam: Appears calm and comfortable  Respiratory system: Clear to auscultation. Respiratory effort normal. Cardiovascular system: S1 & S2 heard, RRR. No JVD, murmurs, rubs, gallops or clicks. No pedal edema. Gastrointestinal system: Abdomen is nondistended, soft and nontender. No organomegaly or masses felt. Normal bowel sounds heard. Central nervous system: Alert and oriented. No focal neurological deficits. Extremities: Symmetric 5 x 5 power. Skin: No rashes, lesions or ulcers Psychiatry: Judgement and insight appear normal. Mood & affect appropriate.     Data Reviewed: I have personally reviewed following labs and imaging studies  CBC: Recent Labs  Lab 08/19/20 1700 08/20/20 0047 08/22/20 0405 08/24/20 0331 08/24/20 1645 08/24/20 1740 08/24/20 1855 08/25/20 0332 08/26/20 0544  WBC 16.1*   < > 10.0 9.4  --   --  5.8 13.7* 10.2  NEUTROABS 14.5*  --  6.6 5.3  --   --   --  11.3* 6.3  HGB 12.2   < > 11.7* 12.4 12.9 12.9  12.9 20.6* 12.6 12.8  HCT 34.9*   < > 33.9* 36.6 38.0 38.0  38.0 59.8* 36.6 37.6  MCV 90.4   < > 92.1 92.9  --   --  90.9 92.4  93.3  PLT 205   < > 194 228  --   --  139* 253 170   < > = values in this interval not displayed.    Basic Metabolic Panel: Recent Labs  Lab 08/19/20 1841 08/20/20 0047 08/20/20 0400 08/22/20 0405 08/24/20 0331 08/24/20 1645 08/24/20 1740 08/24/20 1855 08/25/20 0332 08/26/20 0544  NA  --    < > 135 134* 140 136 136  137  --  133* 134*  K  --    < > 3.4* 3.1* 4.5 4.2 4.0  3.9  --  4.2 3.9  CL  --    < > 106 102 107  --   --   --  104 104  CO2  --    < > 22 26 25   --   --   --  20*  21*  GLUCOSE  --    < > 136* 121* 119*  --   --   --  133* 109*  BUN  --    < > --   --   --  15 14  CREATININE  --    < > 0.83  0.70 0.88 0.86  --   --  0.89 0.80 0.85  CALCIUM  --    < > 9.0 9.4 9.8  --   --   --  9.9 9.8  MG 2.0  --   --  1.8 1.8  --   --   --  1.8 2.0   < > = values in this interval not displayed.    GFR: Estimated Creatinine Clearance: 46.4 mL/min (by C-G formula based on SCr of 0.85 mg/dL). Liver Function Tests: No results for input(s): AST, ALT, ALKPHOS, BILITOT, PROT, ALBUMIN in the last 168 hours. No results for input(s): LIPASE, AMYLASE in the last 168 hours. No results for input(s): AMMONIA in the last 168 hours. Coagulation Profile: No results for input(s): INR, PROTIME in the last 168 hours. Cardiac Enzymes: Recent Labs  Lab 08/19/20 1700  CKTOTAL 155    BNP (last 3 results) No results for input(s): PROBNP in the last 8760 hours. HbA1C: Recent Labs    08/23/20 1427  HGBA1C 5.2    CBG: No results for input(s): GLUCAP in the last 168 hours. Lipid Profile: Recent Labs    08/23/20 1427  CHOL 153  HDL 41  LDLCALC 83  TRIG 144  CHOLHDL 3.7    Thyroid Function Tests: Recent Labs    08/23/20 1427  TSH 1.542    Anemia Panel: No results for input(s): VITAMINB12, FOLATE, FERRITIN, TIBC, IRON, RETICCTPCT in the last 72 hours. Sepsis Labs: No results for input(s): PROCALCITON, LATICACIDVEN in the last 168 hours.  Recent Results (from  the past 240 hour(s))  SARS CORONAVIRUS 2 (TAT 6-24 HRS) Nasopharyngeal Nasopharyngeal Swab     Status: None   Collection Time: 08/20/20  6:31 AM   Specimen: Nasopharyngeal Swab  Result Value Ref Range Status   SARS Coronavirus 2 NEGATIVE NEGATIVE Final    Comment: (NOTE) SARS-CoV-2 target nucleic acids are NOT DETECTED.  The SARS-CoV-2 RNA is generally detectable in upper and lower respiratory specimens during the acute phase of infection. Negative results do not preclude SARS-CoV-2 infection, do not rule out co-infections with other pathogens, and should not be used as the sole basis for treatment or other patient management decisions. Negative results must be combined with clinical observations, patient history, and epidemiological information. The expected result is Negative.  Fact Sheet for Patients: HairSlick.no  Fact Sheet for Healthcare Providers: quierodirigir.com  This test is not yet approved or cleared by the [place] FDA and  has been authorized for detection and/or diagnosis of SARS-CoV-2 by FDA under an Emergency Use Authorization (EUA). This EUA will remain  in effect (meaning this test can be used) for the duration of the COVID-19 declaration under Se ction 564(b)(1) of the Act, 21 U.S.C. section 360bbb-3(b)(1), unless the authorization is terminated or revoked sooner.  Performed at [facility], [address], [place], [place] [address]           Radiology Studies: MR ANGIO HEAD WO CONTRAST  Result Date: 08/25/2020 CLINICAL DATA:  Stroke follow-up EXAM: MRA HEAD WITHOUT CONTRAST TECHNIQUE: Angiographic images of the Circle of Willis were acquired using MRA technique without intravenous contrast.  COMPARISON:  No pertinent prior exam. FINDINGS: POSTERIOR CIRCULATION: --Vertebral arteries: Normal --Inferior cerebellar arteries: Normal. --Basilar artery: Normal. --Superior cerebellar arteries:  Normal. --Posterior cerebral arteries: Normal. ANTERIOR CIRCULATION: --Intracranial internal carotid arteries: Normal. --Anterior cerebral arteries (ACA): Normal. --Middle cerebral arteries (MCA): Normal. ANATOMIC VARIANTS: None Other: None. IMPRESSION: Normal intracranial MRA. Electronically Signed   By: Deatra Robinson M.D.   On: 08/25/2020 02:42   MR ANGIO NECK W WO CONTRAST  Result Date: 08/25/2020 CLINICAL DATA:  Stroke follow-up EXAM: MRA NECK WITHOUT AND WITH CONTRAST TECHNIQUE: Multiplanar and multiecho pulse sequences of the neck were obtained without and with intravenous contrast. Angiographic images of the neck were obtained using MRA technique without and with intravenous contrast. CONTRAST:  58mL GADAVIST GADOBUTROL 1 MMOL/ML IV SOLN COMPARISON:  None. FINDINGS: Right carotid system is normal. 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 Vertebral arteries are right. There is moderate to severe stenosis of the left V4 segment. IMPRESSION: 1. 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 2. Moderate to severe stenosis of the left V4 segment. Electronically Signed   By: Deatra Robinson M.D.   On: 08/25/2020 02:48   CARDIAC CATHETERIZATION  Result Date: 08/24/2020  Normal Right Heart Cath Pressures-PAP 37/8 mmHg with a mean of 22 mmHg.  Prox RCA lesion is 20% stenosed.-Otherwise minimal if any CAD with very tortuous vessels.  SUMMARY  Documented essentially severe aortic stenosis on echocardiogram.  Aortic valve not crossed.  Angiographically normal coronary arteries but very tortuous consistent with hypertensive heart disease  Severe systemic hypertension with pressures ranging from 170 to 200 mmHg systolic.  Normal Right Heart Cath Pressures As Well As Cardiac Output and Index RECOMMENDATIONS  Continue evaluation for syncope, if considered to be related to aortic stenosis, would likely need to wait until leg healed.  Need more aggressive blood pressure management. Bryan Lemma,  MD  CT CORONARY [facility] W/CTA COR W/SCORE [facility] W/CM &/OR WO/CM  Result Date: 08/25/2020 CLINICAL DATA:  Severe Aortic Stenosis. EXAM: Cardiac TAVR CT TECHNIQUE: A non-contrast, gated CT scan was obtained with axial slices of 3 mm through the heart for aortic valve calcium scoring. A 120 kV retrospective, gated, contrast cardiac scan was obtained. Gantry rotation speed was 250 msecs and collimation was 0.6 mm. Nitroglycerin was not given. The 3D data set was reconstructed in 5% intervals of the 0-95% of the R-R cycle. Systolic and diastolic phases were analyzed on a dedicated workstation using MPR, MIP, and VRT modes. The patient received 100 cc of contrast. FINDINGS: Image quality: Good. There is cardiac motion artifact likely related to irregular rhythm versus ectopic beat. Noise artifact is: Limited. Valve Morphology: The aortic valve is tricuspid. There are severe diffuse calcifications. The NCC contains bulky calcification. The leaflets demonstrate severely reduced movement in systole. Aortic Valve Calcium score: 1457 Aortic annular dimension: Phase assessed: 35% Annular area: 289 mm2 Annular perimeter: 61.2 mm Max diameter: 21.1 mm Min diameter: 17.8 mm Annular and subannular calcification: None. There is LVOT calcification that is mild that extends under the LCC to the anterior mitral valve leaflet. Optimal coplanar projection: LAO 10 CAU 9 Coronary Artery Height above Annulus: Left Main: 12.8 mm Right Coronary: 18.3 mm Sinus of Valsalva Measurements: Non-coronary: 27 mm Right-coronary: 26 mm Left-coronary: 25 mm Average diameter: 25.8 mm Sinus of Valsalva Height: Non-coronary: 18.7 mm Right-coronary: 20.6 mm Left-coronary: 18.8 mm Sinotubular Junction: 26 mm with mild calcification Ascending Thoracic Aorta: 31 mm Coronary Arteries: Normal coronary origin. Right dominance. The study  was performed without use of NTG and is insufficient for plaque evaluation. Please refer to recent cardiac catheterization for  coronary assessment. Mild coronary calcifications noted. Cardiac Morphology: Right Atrium: Right atrial size is dilated. Contrast reflux into the IVC consistent with elevated right atrial pressure. Right Ventricle: The right ventricular cavity is within normal limits. Left Atrium: Left atrial size is dilated in size with no left atrial appendage filling defect. Left Ventricle: The ventricular cavity size is within normal limits. There are no stigmata of prior infarction. There is no abnormal filling defect. LV function is hyperdynamic, LVEF=87%. No regional wall motion abnormalities. Pulmonary arteries: Dilated suggestive of pulmonary hypertension. No proximal filling defect. Pulmonary veins: Normal pulmonary venous drainage. Pericardium: Normal thickness with no significant effusion or calcium present. Mitral Valve: The mitral valve is degenerative with moderate to severe annular calcification. Extra-cardiac findings: See attached radiology report for non-cardiac structures. IMPRESSION: 1. Tricuspid aortic valve with severe aortic stenosis (calcium score 1457). 2. Small annular measurements (292 mm2). Sinus measurements and heights support a 23 mm Evolut Pro TAVR. 3. No significant annular calcifications noted. There is mild LVOT calcification under the LCC that extends to the anterior mitral valve. 4.  Sufficient coronary to annulus distance. 5.  Optimal Fluoroscopic Angle for Delivery: LAO 10 CAU 9 6.  Dilated pulmonary artery suggestive of pulmonary hypertension. 7.  Moderate to severe mitral annular calcification noted. Gerri Spore T. Flora Lipps, MD Electronically Signed   By: Lennie Odor   On: 08/25/2020 20:58   EEG adult  Result Date: 08/25/2020 Charlsie Quest, MD     08/25/2020 11:40 AM Patient Name: Terri King MRN: [id number] Epilepsy Attending: Charlsie Quest Referring Physician/Provider: Dr. Onalee Hua Date: 08/25/2020 Duration: 22.29 minutes Patient history: 86 y.o. female who presented to the  ED for evaluation of syncope.  EEG to evaluate for seizures. Level of alertness: Awake AEDs during EEG study: Gabapentin Technical aspects: This EEG study was done with scalp electrodes positioned according to the 10-20 International system of electrode placement. Electrical activity was acquired at a sampling rate of 500Hz  and reviewed with a high frequency filter of 70Hz  and a low frequency filter of 1Hz . EEG data were recorded continuously and digitally stored. Description: The posterior dominant rhythm consists of 7.5Hz  activity of moderate voltage (25-35 uV) seen predominantly in posterior head regions, symmetric and reactive to eye opening and eye closing. Hyperventilation and photic stimulation were not performed.   IMPRESSION: This study is within normal limits. No seizures or epileptiform discharges were seen throughout the recording. Priyanka        Scheduled Meds:  amLODipine  5 mg Oral Daily   aspirin EC  81 mg Oral Daily   enoxaparin (LOVENOX) injection  30 mg Subcutaneous Q24H   escitalopram  10 mg Oral Daily   famotidine  40 mg Oral Daily   gabapentin  100 mg Oral TID   metoprolol succinate  50 mg Oral Daily   QUEtiapine  25 mg Oral QHS   sodium chloride flush  3 mL Intravenous Q12H   sodium chloride flush  3 mL Intravenous Q12H   temazepam  15 mg Oral QHS   triamterene-hydrochlorothiazide  1 tablet Oral Daily   Continuous Infusions:  sodium chloride      LOS: 7 days   Time spent: 40 min  , DO [facility]  If 7PM-7AM, please contact night-coverage [email or web address]  08/26/2020, 8:04 AM

## 2020-08-26 NOTE — Progress Notes (Signed)
MD, pt's BP this am 194/56, HR 68, pt was not in any pain, on call MD notified and ordered to give Norvasc and Metoprolol early today to help with this, will continue to monitor, Thanks Lavonda Jumbo RN.

## 2020-08-26 NOTE — Progress Notes (Addendum)
Progress Note  Patient Name: Terri King Date of Encounter: 08/26/2020  [facility] Cardiologist: Dietrich Pates, MD   Subjective   Denies any CP or SOB.   Inpatient Medications    Scheduled Meds:  amLODipine  5 mg Oral Daily   aspirin EC  81 mg Oral Daily   enoxaparin (LOVENOX) injection  30 mg Subcutaneous Q24H   escitalopram  10 mg Oral Daily   famotidine  40 mg Oral Daily   gabapentin  100 mg Oral TID   metoprolol succinate  50 mg Oral Daily   QUEtiapine  25 mg Oral QHS   sodium chloride flush  3 mL Intravenous Q12H   sodium chloride flush  3 mL Intravenous Q12H   temazepam  15 mg Oral QHS   triamterene-hydrochlorothiazide  1 tablet Oral Daily   Continuous Infusions:  sodium chloride     PRN Meds: sodium chloride, HYDROcodone-acetaminophen, ibuprofen, nitroGLYCERIN, ondansetron (ZOFRAN) IV, senna-docusate, sodium chloride flush   Vital Signs    Vitals:   08/25/20 2259 08/26/20 0020 08/26/20 0446 08/26/20 0727  BP: (!) 169/68 (!) 146/53 (!) 194/56 (!) 175/62  Pulse: 73 73 78 70  Resp:   18 18  Temp:   98 F (36.7 C) 97.9 F (36.6 C)  TempSrc:   Oral Oral  SpO2:   100%   Weight:   70.7 kg   Height:        Intake/Output Summary (Last 24 hours) at 08/26/2020 0944 Last data filed at 08/26/2020 0912 Gross per 24 hour  Intake 458 ml  Output 1150 ml  Net -692 ml   Last 3 Weights 08/26/2020 08/25/2020 08/20/2020  Weight (lbs) 155 lb 13.8 oz 155 lb 13.8 oz 165 lb 11.2 oz  Weight (kg) 70.7 kg 70.7 kg 75.161 kg      Telemetry    NSR with HR in the 60s - Personally Reviewed  ECG    NSR without significant ST-T wave changes - Personally Reviewed  Physical Exam   GEN: No acute distress.   Neck: No JVD Cardiac: RRR, no murmurs, rubs, or gallops.  Respiratory: Clear to auscultation bilaterally. GI: Soft, nontender, non-distended  MS: No edema; No deformity. Neuro:  Nonfocal  Psych: Normal affect   Labs    High Sensitivity Troponin:  No results for  input(s): TROPONINIHS in the last 720 hours.    Chemistry Recent Labs  Lab 08/24/20 0331 08/24/20 1645 08/24/20 1740 08/24/20 1855 08/25/20 0332 08/26/20 0544  NA 140   < > 136  137  --  133* 134*  K 4.5   < > 4.0  3.9  --  4.2 3.9  CL 107  --   --   --  104 104  CO2 25  --   --   --  20* 21*  GLUCOSE 119*  --   --   --  133* 109*  BUN 15  --   --   --  15 14  CREATININE 0.86  --   --  0.89 0.80 0.85  CALCIUM 9.8  --   --   --  9.9 9.8  GFRNONAA >60  --   --  >60 >60 >60  ANIONGAP 8  --   --   --  9 9   < > = values in this interval not displayed.     Hematology Recent Labs  Lab 08/24/20 1855 08/25/20 0332 08/26/20 0544  WBC 5.8 13.7* 10.2  RBC 6.58* 3.96 4.03  HGB 20.6* 12.6 12.8  HCT 59.8* 36.6 37.6  MCV 90.9 92.4 93.3  MCH 31.3 31.8 31.8  MCHC 34.4 34.4 34.0  RDW 13.2 13.2 13.7  PLT 139* 253 170    BNPNo results for input(s): BNP, PROBNP in the last 168 hours.   DDimer No results for input(s): DDIMER in the last 168 hours.   Radiology    MR ANGIO HEAD WO CONTRAST  Result Date: 08/25/2020 CLINICAL DATA:  Stroke follow-up EXAM: MRA HEAD WITHOUT CONTRAST TECHNIQUE: Angiographic images of the Circle of Willis were acquired using MRA technique without intravenous contrast. COMPARISON:  No pertinent prior exam. FINDINGS: POSTERIOR CIRCULATION: --Vertebral arteries: Normal --Inferior cerebellar arteries: Normal. --Basilar artery: Normal. --Superior cerebellar arteries: Normal. --Posterior cerebral arteries: Normal. ANTERIOR CIRCULATION: --Intracranial internal carotid arteries: Normal. --Anterior cerebral arteries (ACA): Normal. --Middle cerebral arteries (MCA): Normal. ANATOMIC VARIANTS: None Other: None. IMPRESSION: Normal intracranial MRA. Electronically Signed   By: Deatra Robinson M.D.   On: 08/25/2020 02:42   MR ANGIO NECK W WO CONTRAST  Result Date: 08/25/2020 CLINICAL DATA:  Stroke follow-up EXAM: MRA NECK WITHOUT AND WITH CONTRAST TECHNIQUE: Multiplanar and  multiecho pulse sequences of the neck were obtained without and with intravenous contrast. Angiographic images of the neck were obtained using MRA technique without and with intravenous contrast. CONTRAST:  47mL GADAVIST GADOBUTROL 1 MMOL/ML IV SOLN COMPARISON:  None. FINDINGS: Right carotid system is normal. 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 Vertebral arteries are right. There is moderate to severe stenosis of the left V4 segment. IMPRESSION: 1. 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 2. Moderate to severe stenosis of the left V4 segment. Electronically Signed   By: Deatra Robinson M.D.   On: 08/25/2020 02:48   CARDIAC CATHETERIZATION  Result Date: 08/24/2020  Normal Right Heart Cath Pressures-PAP 37/8 mmHg with a mean of 22 mmHg.  Prox RCA lesion is 20% stenosed.-Otherwise minimal if any CAD with very tortuous vessels.  SUMMARY  Documented essentially severe aortic stenosis on echocardiogram.  Aortic valve not crossed.  Angiographically normal coronary arteries but very tortuous consistent with hypertensive heart disease  Severe systemic hypertension with pressures ranging from 170 to 200 mmHg systolic.  Normal Right Heart Cath Pressures As Well As Cardiac Output and Index RECOMMENDATIONS  Continue evaluation for syncope, if considered to be related to aortic stenosis, would likely need to wait until leg healed.  Need more aggressive blood pressure management. Bryan Lemma, MD  CT CORONARY [facility] W/CTA COR W/SCORE [facility] W/CM &/OR WO/CM  Addendum Date: 08/26/2020   ADDENDUM REPORT: 08/26/2020 08:10 ADDENDUM: Please see separate dictation for contemporaneously obtained CTA chest, abdomen and pelvis dated 08/26/2020 for full description of relevant extracardiac findings. Electronically Signed   By: Trudie Reed M.D.   On: 08/26/2020 08:10   Result Date: 08/26/2020 CLINICAL DATA:  Severe Aortic Stenosis. EXAM: Cardiac TAVR CT TECHNIQUE: A non-contrast, gated CT scan was  obtained with axial slices of 3 mm through the heart for aortic valve calcium scoring. A 120 kV retrospective, gated, contrast cardiac scan was obtained. Gantry rotation speed was 250 msecs and collimation was 0.6 mm. Nitroglycerin was not given. The 3D data set was reconstructed in 5% intervals of the 0-95% of the R-R cycle. Systolic and diastolic phases were analyzed on a dedicated workstation using MPR, MIP, and VRT modes. The patient received 100 cc of contrast. FINDINGS: Image quality: Good. There is cardiac motion artifact likely related to irregular rhythm versus ectopic beat. Noise  artifact is: Limited. Valve Morphology: The aortic valve is tricuspid. There are severe diffuse calcifications. The NCC contains bulky calcification. The leaflets demonstrate severely reduced movement in systole. Aortic Valve Calcium score: 1457 Aortic annular dimension: Phase assessed: 35% Annular area: 289 mm2 Annular perimeter: 61.2 mm Max diameter: 21.1 mm Min diameter: 17.8 mm Annular and subannular calcification: None. There is LVOT calcification that is mild that extends under the LCC to the anterior mitral valve leaflet. Optimal coplanar projection: LAO 10 CAU 9 Coronary Artery Height above Annulus: Left Main: 12.8 mm Right Coronary: 18.3 mm Sinus of Valsalva Measurements: Non-coronary: 27 mm Right-coronary: 26 mm Left-coronary: 25 mm Average diameter: 25.8 mm Sinus of Valsalva Height: Non-coronary: 18.7 mm Right-coronary: 20.6 mm Left-coronary: 18.8 mm Sinotubular Junction: 26 mm with mild calcification Ascending Thoracic Aorta: 31 mm Coronary Arteries: Normal coronary origin. Right dominance. The study was performed without use of NTG and is insufficient for plaque evaluation. Please refer to recent cardiac catheterization for coronary assessment. Mild coronary calcifications noted. Cardiac Morphology: Right Atrium: Right atrial size is dilated. Contrast reflux into the IVC consistent with elevated right atrial pressure.  Right Ventricle: The right ventricular cavity is within normal limits. Left Atrium: Left atrial size is dilated in size with no left atrial appendage filling defect. Left Ventricle: The ventricular cavity size is within normal limits. There are no stigmata of prior infarction. There is no abnormal filling defect. LV function is hyperdynamic, LVEF=87%. No regional wall motion abnormalities. Pulmonary arteries: Dilated suggestive of pulmonary hypertension. No proximal filling defect. Pulmonary veins: Normal pulmonary venous drainage. Pericardium: Normal thickness with no significant effusion or calcium present. Mitral Valve: The mitral valve is degenerative with moderate to severe annular calcification. Extra-cardiac findings: See attached radiology report for non-cardiac structures. IMPRESSION: 1. Tricuspid aortic valve with severe aortic stenosis (calcium score 1457). 2. Small annular measurements (292 mm2). Sinus measurements and heights support a 23 mm Evolut Pro TAVR. 3. No significant annular calcifications noted. There is mild LVOT calcification under the LCC that extends to the anterior mitral valve. 4.  Sufficient coronary to annulus distance. 5.  Optimal Fluoroscopic Angle for Delivery: LAO 10 CAU 9 6.  Dilated pulmonary artery suggestive of pulmonary hypertension. 7.  Moderate to severe mitral annular calcification noted. Gerri Spore T. Flora Lipps, MD Electronically Signed: By: Lennie Odor On: 08/25/2020 20:58   EEG adult  Result Date: 08/25/2020 Charlsie Quest, MD     08/25/2020 11:40 AM Patient Name: Terri King MRN: [id number] Epilepsy Attending: Charlsie Quest Referring Physician/Provider: Dr. Onalee Hua Date: 08/25/2020 Duration: 22.29 minutes Patient history: 86 y.o. female who presented to the ED for evaluation of syncope.  EEG to evaluate for seizures. Level of alertness: Awake AEDs during EEG study: Gabapentin Technical aspects: This EEG study was done with scalp electrodes positioned  according to the 10-20 International system of electrode placement. Electrical activity was acquired at a sampling rate of  and reviewed with a high frequency filter of  and a low frequency filter of . EEG data were recorded continuously and digitally stored. Description: The posterior dominant rhythm consists of 7.5Hz  activity of moderate voltage (25-35 uV) seen predominantly in posterior head regions, symmetric and reactive to eye opening and eye closing. Hyperventilation and photic stimulation were not performed.   IMPRESSION: This study is within normal limits. No seizures or epileptiform discharges were seen throughout the recording. Charlsie Quest    Cardiac Studies   Echo 08/20/2020 1. The aortic valve is calcified.  There is severe calcifcation of the  aortic valve. There is severe thickening of the aortic valve. Aortic valve  regurgitation is mild. Severe aortic valve stenosis. AVA 0.9cm2, mean  gradient 38mmHg, peak gradient 67mmHg,   Vmax 4.8m88/s   2. Left ventricular ejection fraction, by estimation, is 65 to 70%. Left  ventricular ejection fraction by 3D volume is 71 %. The left ventricle has  normal function. The left ventricle has no regional wall motion  abnormalities. There is moderate  asymmetric hypertrophy of the basal-septum. The rest of the LV segments  demonstrate mild left ventricular hypertrophy. Left ventricular diastolic  parameters are consistent with Grade I diastolic dysfunction (impaired  relaxation). Elevated left atrial  pressure. The average left ventricular global longitudinal strain is -20.2  %. The global longitudinal strain is normal.   3. Right ventricular systolic function is normal. The right ventricular  size is normal. There is severely elevated pulmonary artery systolic  pressure. The estimated right ventricular systolic pressure is 86.2 mmHg.   4. The mitral valve is abnormal. There is moderate thickening of the  mitral valve leaflet(s).  There is moderate calcification of the mitral  valve leaflet(s). Moderate to severe mitral annular calcification. Trivial  mitral valve regurgitation. Mild  calcific mitral stenosis with MVA 1.5cm2 by continuity, mean gradient  4mmHg at HR 74bpm.   5. The inferior vena cava is dilated in size with >50% respiratory  variability, suggesting right atrial pressure of 8 mmHg.   Comparison(s): Compared to prior TTE in 03/2019, the aortic stenosis is  now severe (previously moderate) and there is severe pulmonary  hypertension with PASP 67mmHg.    Cath 08/24/2020 Normal Right Heart Cath Pressures-PAP 37/8 mmHg with a mean of 22 mmHg. Prox RCA lesion is 20% stenosed.-Otherwise minimal if any CAD with very tortuous vessels.   SUMMARY Documented essentially severe aortic stenosis on echocardiogram.  Aortic valve not crossed. Angiographically normal coronary arteries but very tortuous consistent with hypertensive heart disease Severe systemic hypertension with pressures ranging from 170 to 200 mmHg systolic. Normal Right Heart Cath Pressures As Well As Cardiac Output and Index     RECOMMENDATIONS Continue evaluation for syncope, if considered to be related to aortic stenosis, would likely need to wait until leg healed. Need more aggressive blood pressure management.   Coronary CT 08/25/2020 IMPRESSION: 1. Tricuspid aortic valve with severe aortic stenosis (calcium score 1457).   2. Small annular measurements (292 mm2). Sinus measurements and heights support a 23 mm Evolut Pro TAVR.   3. No significant annular calcifications noted. There is mild LVOT calcification under the LCC that extends to the anterior mitral valve.   4.  Sufficient coronary to annulus distance.   5.  Optimal Fluoroscopic Angle for Delivery: LAO 10 CAU 9   6.  Dilated pulmonary artery suggestive of pulmonary hypertension.   7.  Moderate to severe mitral annular calcification noted.  Patient Profile     86 y.o.  female with PMH of aortic stenosis, HTN, HLD and OA presented to the hospital after being found at the base her stairs. She does not remember the fall, suspect to have syncope. Previous echo in Feb 2021 showed moderate AS, repeat echo 08/20/2020 showed severe aortic stenosis. Syncope felt to be restarted to severe AS. Underwent cath on 7/6, this showed no significant CAD. MRI obtained after cath showed subacute left occipital infarct, this was felt to be a incidental finding. EEG normal. Neurology recommend proceed with TAVR sooner rather than later  despite slight bleeding risk into her silent stroke.   Assessment & Plan    Syncope  - no prodrome symptom, suspect secondary to aortic stenosis.   Aortic stenosis  - Previous echo in Feb 2021 showed moderate AS  - Echo 08/20/2020 EF 65-70%, severe aortic stenosis, RVSP 86.2 mmHg  - Cath 08/24/2020 20% prox RCA lesion, otherwise minimal if any CAD. Aortic valve not crossed  - Cardiac CT 08/25/2020 shows severe aortic stenosis. Will discuss with MD, potentially ask Dr. Clifton James to see the patient to today in anticipation of TAVR workup.  Subacute CVA: seen by neurology, felt to be incidental finding given the silent nature. Per neurology, recommend "dual antiplatelet therapy of aspirin 81 mg and Plavix for 3 months given her severe left vertebral artery stenosis which should be managed conservatively at the present time.  The patient may need TAVR and anticoagulation for short time during the procedure.  Given the risk benefits of her recent syncope and critical aortic stenosis would be inclined to go ahead with the procedure sooner than later despite  a slight risk of bleeding into her silent stroke.  Discussed with Dr. Elease Hashimoto cardiology."  HTN  HLD      For questions or updates, please contact [facility] Please consult [email or web address] for contact info under        Signed, Azalee Course, PA  08/26/2020, 9:44 AM    Attending Note:   The patient was seen  and examined.  Agree with assessment and plan as noted above.  Changes made to the above note as needed.  Patient seen and independently examined with Azalee Course, PA .   We discussed all aspects of the encounter. I agree with the assessment and plan as stated above.    Severe aortic stenosis:  no significant CAD by cath yesterday .  TAVR team to see her today   2.  Subacute stroke:  was found to have an incidental stroke.  Neuro has seen and has cleared her for TAVR and other cardiology procedures.  3.  HTN:  stable    I have spent a total of 40 minutes with patient reviewing hospital  notes , telemetry, EKGs, labs and examining patient as well as establishing an assessment and plan that was discussed with the patient.  > 50% of time was spent in direct patient care.    Vesta Mixer, Montez Hageman., MD, [facility] 08/26/2020, 11:46 AM [address]  [address] Office [phone] Pager [phone]

## 2020-08-26 NOTE — Consult Note (Addendum)
Structural Heart Team Consult Note   History of Present Illness: 86 yo female with history of aortic stenosis, HTN, hyperlipidemia admitted after a syncopal episode. She has been followed in our office by Dr. Harrington Challenger for moderate aortic stenosis. She has had prior cardiac catheterizations with no evidence of CAD. She is admitted to [facility] 08/19/20 after  a reported syncopal event while climbing the stairs in her house. She was hypokalemic and suffered a left femur fracture. She was found to have an an occipital stroke which per Neurology is most likely an incidental finding. Echo 08/20/20 with LVEF=65-70%. There is basal septal hypertrophy. The aortic valve is thickened and calcified with limited leaflet excursion. Mean gradient 38 mmHg, peak gradient 67 mmhg, AVA 0.9 cm2, dimensionless index 0.36, SVI 38. Cardiac cath 08/24/20 with mild CAD. Normal right heart pressures and normal cardiac output. Cardiac CT with small annular measurement with annular area 289 mm2 but sinus measurement may support a 23 mm Evolut Pro valve. No official read on the CTA abdomen/pelvis but from my review she seems to have adequate sizef iliofemoral vessels to support transfemoral access for TAVR. Ortho planning conservative management of her fracture. Neurology has recommended ASA and Plavix and has given clearance to proceed with TAVR if necessary.   She tells me today that she has progressive dyspnea and fatigue at home. No chest pain. No LE edema. She is very active at baseline. She has been living in a hotel while her house is being remodeled. She has partial dentures. No active dental issues. Retired as a Naval architect at [employer]. She lives in [place], [place] alone. One son lives in [place] and one son lives in [place], [place]. Her son from [place] is here today.    Primary Care Physician: Cassandria Anger, MD Primary Cardiologist: Harrington Challenger Referring Cardiologist: Nahser  Past Medical History:   Diagnosis Date   Aortic stenosis    Stage B Moderate AS (Echocardiogram 03/2019: EF 65-70, no RWMA, mild LVH, Gr 1 DD, RVSP 37.6 (mild elevation), mild MR, mod AI, mod AS (mean 28 mmHg/peak 52.3 mmHg/LVOT AV VTI ratio: 0.37)   CAD (coronary artery disease)    DR Harrington Challenger; by notes normal cath in 1986, normal stress 2013   Family history of anesthesia complication    NIENCE had sensitivity   History of pneumonia 2008   HTN (hypertension)    dr Alain Marion   Hyperlipemia    MI (myocardial infarction) ([place])    DR Harrington Challenger   Osteoarthritis    Shortness of breath     Past Surgical History:  Procedure Laterality Date   JOINT REPLACEMENT  12/13   L TKR    KNEE ARTHROSCOPY     left   PARTIAL HYSTERECTOMY     RIGHT HEART CATH AND CORONARY ANGIOGRAPHY N/A 08/24/2020   Procedure: RIGHT HEART CATH AND CORONARY ANGIOGRAPHY;  Surgeon: Leonie Man, MD;  Location: [facility];  Service: Cardiovascular;  Laterality: N/A;   TONSILLECTOMY     TOTAL KNEE ARTHROPLASTY  01/23/2012   Procedure: TOTAL KNEE ARTHROPLASTY;  Surgeon: Ninetta Lights, MD;  Location: [facility];  Service: Orthopedics;  Laterality: Left;   TOTAL KNEE ARTHROPLASTY  12/'05/2011   left knee    Current Facility-Administered Medications  Medication Dose Route Frequency Provider Last Rate Last Admin   0.9 %  sodium chloride infusion  250 mL Intravenous PRN Leonie Man, MD       amLODipine (  NORVASC) tablet 5 mg  5 mg Oral Daily Leonie Man, MD   5 mg at 08/26/20 4665   aspirin EC tablet 81 mg  81 mg Oral Daily Greta Doom, MD   81 mg at 08/26/20 9935   enoxaparin (LOVENOX) injection 30 mg  30 mg Subcutaneous Q24H Leonie Man, MD   30 mg at 08/26/20 7017   escitalopram (LEXAPRO) tablet 10 mg  10 mg Oral Daily Leonie Man, MD   10 mg at 08/26/20 0920   famotidine (PEPCID) tablet 40 mg  40 mg Oral Daily Leonie Man, MD   40 mg at 08/26/20 7939   gabapentin (NEURONTIN) capsule 100 mg  100 mg Oral TID  Leonie Man, MD   100 mg at 08/26/20 0920   HYDROcodone-acetaminophen (NORCO/VICODIN) 5-325 MG per tablet 1 tablet  1 tablet Oral Q6H PRN Little Ishikawa, MD   1 tablet at 08/25/20 2303   ibuprofen (ADVIL) tablet 200 mg  200 mg Oral Q6H PRN Leonie Man, MD   200 mg at 08/25/20 0507   metoprolol succinate (TOPROL-XL) 24 hr tablet 50 mg  50 mg Oral Daily Leonie Man, MD   50 mg at 08/26/20 0515   nitroGLYCERIN (NITROSTAT) SL tablet 0.4 mg  0.4 mg Sublingual Q5 min PRN Leonie Man, MD   0.4 mg at 08/25/20 1659   ondansetron (ZOFRAN) injection 4 mg  4 mg Intravenous Q6H PRN Leonie Man, MD       QUEtiapine (SEROQUEL) tablet 25 mg  25 mg Oral QHS Leonie Man, MD   25 mg at 08/25/20 2258   senna-docusate (Senokot-S) tablet 1 tablet  1 tablet Oral QHS PRN Leonie Man, MD       sodium chloride flush (NS) 0.9 % injection 3 mL  3 mL Intravenous Q12H Leonie Man, MD   3 mL at 08/26/20 0924   sodium chloride flush (NS) 0.9 % injection 3 mL  3 mL Intravenous Q12H Leonie Man, MD   3 mL at 08/26/20 0023   sodium chloride flush (NS) 0.9 % injection 3 mL  3 mL Intravenous PRN Leonie Man, MD       temazepam (RESTORIL) capsule 15 mg  15 mg Oral QHS Leonie Man, MD   15 mg at 08/25/20 2255   triamterene-hydrochlorothiazide (MAXZIDE-25) 37.5-25 MG per tablet 1 tablet  1 tablet Oral Daily Leonie Man, MD   1 tablet at 08/26/20 0920    Allergies  Allergen Reactions   Codeine Other (See Comments)    Abnormal behavior   Crestor [Rosuvastatin]     Myalgias    Dexilant [Dexlansoprazole]     Made me sick   Ibuprofen Nausea And Vomiting   Simvastatin     REACTION: achy   Tylenol [Acetaminophen]    Iodine Swelling and Rash    Social History   Socioeconomic History   Marital status: Widowed    Spouse name: Not on file   Number of children: Not on file   Years of education: Not on file   Highest education level: Not on file  Occupational  History   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)  Tobacco Use   Smoking status: Never   Smokeless tobacco: Never  Vaping Use   Vaping Use: Never used  Substance and Sexual Activity   Alcohol use: No   Drug use: No   Sexual activity: Never  Other Topics Concern   Not on file  Social History Narrative   Not on file   Social Determinants of Health   Financial Resource Strain: Low Risk    Difficulty of Paying Living Expenses: Not hard at all  Food Insecurity: No Food Insecurity   Worried About Charity fundraiser in the Last Year: Never true   [place] in the Last Year: Never true  Transportation Needs: No Transportation Needs   Lack of Transportation (Medical): No   Lack of Transportation (Non-Medical): No  Physical Activity: Insufficiently Active   Days of Exercise per Week: 7 days   Minutes of Exercise per Session: 20 min  Stress: No Stress Concern Present   Feeling of Stress : Not at all  Social Connections: Moderately Integrated   Frequency of Communication with Friends and Family: More than three times a week   Frequency of Social Gatherings with Friends and Family: Once a week   Attends Religious Services: 1 to 4 times per year   Active Member of [employer] or Organizations: No   Attends Archivist Meetings: 1 to 4 times per year   Marital Status: Widowed  Human resources officer Violence: Not At Risk   Fear of Current or Ex-Partner: No   Emotionally Abused: No   Physically Abused: No   Sexually Abused: No    Family History  Problem Relation Age of Onset   Glaucoma Father    Hypertension Mother    Hypertension Other     Review of Systems:  As stated in the HPI and otherwise negative.   BP (!) 127/52 (BP Location: Right Arm)   Pulse 68   Temp 98.7 F (37.1 C) (Oral)   Resp 18   Ht 5' 3" (1.6 m)   Wt 70.7 kg   SpO2 100%   BMI 27.61 kg/m   Physical Examination: General: Well developed, well nourished, NAD  HEENT: OP  clear, mucus membranes moist  SKIN: warm, dry. No rashes. Neuro: No focal deficits  Musculoskeletal: Muscle strength 5/5 all ext  Psychiatric: Mood and affect normal  Neck: No JVD, no carotid bruits, no thyromegaly, no lymphadenopathy.  Lungs:Clear bilaterally, no wheezes, rhonci, crackles Cardiovascular: Regular rate and rhythm. Loud, harsh, late peaking systolic murmur.  Abdomen:Soft. Bowel sounds present. Non-tender.  Extremities: No lower extremity edema. Pulses are 2 + in the bilateral DP/PT. Left knee in immobilizer.   EKG:  EKG from 08/23/20 shows sinus with repol abn  Echo 08/20/20:  1. The aortic valve is calcified. There is severe calcifcation of the  aortic valve. There is severe thickening of the aortic valve. Aortic valve  regurgitation is mild. Severe aortic valve stenosis. AVA 0.9cm2, mean  gradient 37mHg, peak gradient 633mg,   Vmax 4.21m29m  2. Left ventricular ejection fraction, by estimation, is 65 to 70%. Left  ventricular ejection fraction by 3D volume is 71 %. The left ventricle has  normal function. The left ventricle has no regional wall motion  abnormalities. There is moderate  asymmetric hypertrophy of the basal-septum. The rest of the LV segments  demonstrate mild left ventricular hypertrophy. Left ventricular diastolic  parameters are consistent with Grade I diastolic dysfunction (impaired  relaxation). Elevated left atrial  pressure. The average left ventricular global longitudinal strain is -20.2  %. The global longitudinal strain is normal.   3. Right ventricular systolic function is normal. The right ventricular  size is normal. There is severely elevated pulmonary artery systolic  pressure.  The estimated right ventricular systolic pressure is 34.1 mmHg.   4. The mitral valve is abnormal. There is moderate thickening of the  mitral valve leaflet(s). There is moderate calcification of the mitral  valve leaflet(s). Moderate to severe mitral annular  calcification. Trivial  mitral valve regurgitation. Mild  calcific mitral stenosis with MVA 1.5cm2 by continuity, mean gradient  73mHg at HR 74bpm.   5. The inferior vena cava is dilated in size with >50% respiratory  variability, suggesting right atrial pressure of 8 mmHg.   Comparison(s): Compared to prior TTE in 03/2019, the aortic stenosis is  now severe (previously moderate) and there is severe pulmonary  hypertension with PASP 652mg.   FINDINGS   Left Ventricle: Left ventricular ejection fraction, by estimation, is 65  to 70%. Left ventricular ejection fraction by 3D volume is 71 %. The left  ventricle has normal function. The left ventricle has no regional wall  motion abnormalities. The average  left ventricular global longitudinal strain is -20.2 %. The global  longitudinal strain is normal. The left ventricular internal cavity size  was normal in size. There is moderate asymmetric hypertrophy of the  basal-septum. The rest of the LV segments  demonstrate mild left ventricular hypertrophy. Left ventricular diastolic  parameters are consistent with Grade I diastolic dysfunction (impaired  relaxation). Elevated left atrial pressure.   Right Ventricle: The right ventricular size is normal. No increase in  right ventricular wall thickness. Right ventricular systolic function is  normal. There is severely elevated pulmonary artery systolic pressure. The  tricuspid regurgitant velocity is  4.22 m/s, and with an assumed right atrial pressure of 15 mmHg, the  estimated right ventricular systolic pressure is 8693.7mHg.   Left Atrium: Left atrial size was normal in size.   Right Atrium: Right atrial size was normal in size.   Pericardium: There is no evidence of pericardial effusion.   Mitral Valve: The mitral valve is abnormal. There is moderate thickening  of the mitral valve leaflet(s). There is moderate calcification of the  mitral valve leaflet(s). Moderate to severe  mitral annular calcification.  Trivial mitral valve regurgitation.  MV peak gradient, 14.8 mmHg. The mean mitral valve gradient is 4.0 mmHg.  Mild calcific mitral stenosis with MVA 1.5cm2 by continuity, mean gradient  58m76m at HR 74bpm.   Tricuspid Valve: The tricuspid valve is normal in structure. Tricuspid  valve regurgitation is mild.   Aortic Valve: There is severe calcifcation of the aortic valve. There is  severe thickening of the aortic valve. Aortic valve regurgitation is mild.  Aortic regurgitation PHT measures 426 msec. Severe aortic stenosis is  present. AVA 0.9cm2, mean gradient  79m90m peak gradient 67mm10mVmax 4.37m/s.658mPulmonic Valve: The pulmonic valve was not well visualized. Pulmonic valve  regurgitation is trivial.   Aorta: The aortic root and ascending aorta are structurally normal, with  no evidence of dilitation.   Venous: The inferior vena cava is dilated in size with greater than 50%  respiratory variability, suggesting right atrial pressure of 8 mmHg.   IAS/Shunts: The interatrial septum is aneurysmal. No atrial level shunt  detected by color flow Doppler.      LEFT VENTRICLE  PLAX 2D  LVIDd:         2.90 cm         Diastology  LVIDs:         1.70 cm         LV e' medial:    5.22  cm/s  LV PW:         1.50 cm         LV E/e' medial:  22.5  LV IVS:        1.40 cm         LV e' lateral:   7.51 cm/s  LVOT diam:     1.80 cm         LV E/e' lateral: 15.6  LV SV:         68  LV SV Index:   38              2D  LVOT Area:     2.54 cm        Longitudinal                                 Strain                                 2D Strain GLS  -20.2 %  LV Volumes (MOD)               Avg:  LV vol d, MOD    57.6 ml  A2C:                           3D Volume EF  LV vol d, MOD    48.3 ml       LV 3D EF:    Left  A4C:                                        ventricular  LV vol s, MOD    17.3 ml                    ejection  A2C:                                         fraction by  LV vol s, MOD    11.3 ml                    3D volume  A4C:                                        is 71 %.  LV SV MOD A2C:   40.3 ml  LV SV MOD A4C:   48.3 ml  LV SV MOD BP:    39.0 ml       3D Volume EF:                                 3D EF:        71 %   RIGHT VENTRICLE             IVC  RV S prime:     11.90 cm/s  IVC diam: 2.00 cm  TAPSE (M-mode): 2.2 cm   LEFT ATRIUM  Index       RIGHT ATRIUM           Index  LA diam:        3.70 cm 2.07 cm/m  RA Area:     14.40 cm  LA Vol (A2C):   40.8 ml 22.86 ml/m RA Volume:   35.60 ml  19.94 ml/m  LA Vol (A4C):   32.2 ml 18.04 ml/m  LA Biplane Vol: 37.8 ml 21.18 ml/m   AORTIC VALVE  AV Area (Vmax):    0.94 cm  AV Area (Vmean):   0.92 cm  AV Area (VTI):     0.92 cm  AV Vmax:           381.00 cm/s  AV Vmean:          265.333 cm/s  AV VTI:            0.743 m  AV Peak Grad:      58.1 mmHg  AV Mean Grad:      33.0 mmHg  LVOT Vmax:         140.00 cm/s  LVOT Vmean:        95.900 cm/s  LVOT VTI:          0.269 m  LVOT/AV VTI ratio: 0.36  AI PHT:            426 msec     AORTA  Ao Root diam: 2.80 cm  Ao Asc diam:  3.10 cm   MITRAL VALVE                TRICUSPID VALVE  MV Area (PHT): 2.79 cm     TR Peak grad:   71.2 mmHg  MV Area VTI:   1.38 cm     TR Vmax:        422.00 cm/s  MV Peak grad:  14.8 mmHg  MV Mean grad:  4.0 mmHg     SHUNTS  MV Vmax:       1.92 m/s     Systemic VTI:  0.27 m  MV Vmean:      90.3 cm/s    Systemic Diam: 1.80 cm  MV Decel Time: 272 msec  MV E velocity: 117.50 cm/s  MV A velocity: 175.00 cm/s  MV E/A ratio:  0.67   Cardiac cath 08/24/20: Conclusion    Normal Right Heart Cath Pressures-PAP 37/8 mmHg with a mean of 22 mmHg. Prox RCA lesion is 20% stenosed.-Otherwise minimal if any CAD with very tortuous vessels.   SUMMARY Documented essentially severe aortic stenosis on echocardiogram.  Aortic valve not crossed. Angiographically normal coronary arteries but very tortuous  consistent with hypertensive heart disease Severe systemic hypertension with pressures ranging from 170 to 300 mmHg systolic. Normal Right Heart Cath Pressures As Well As Cardiac Output and Index     RECOMMENDATIONS Continue evaluation for syncope, if considered to be related to aortic stenosis, would likely need to wait until leg healed. Need more aggressive blood pressure management.     Glenetta Hew, MD      Recommendations  Antiplatelet/Anticoag No indication for antiplatelet therapy at this time .  Discharge Date Anticipated discharge date to be determined.     Indications  Syncope and collapse [T62 (ICD-10-CM)]  Essential hypertension [I10 (ICD-10-CM)]  Aortic valve stenosis, etiology of cardiac valve disease unspecified [I35.0 (ICD-10-CM)]    Procedural Details  Technical Details Primary Care Provider:[name], Evie Lacks, MD [facility] Cardiologist: Dorris Carnes, MD  Referring Cardiologist:  Dr. Acie Fredrickson   86 y.o. female with a hx of aortic stenosis, hypertension, hyperlipidemia and osteoarthritis who was seen by Dr. Acie Fredrickson for the evaluation of syncope and aortic stenosis at the request of Dr. Sabino Gasser. Found to have small stroke on MRI of brain.  Based on syncope is a potential symptom of aortic stenosis, she is referred for right left heart catheterization.  With concern for possible recent stroke, we decided to avoid crossing the valve at this time.  Time Out: Verified patient identification, verified procedure, site/side was marked, verified correct patient position, special equipment/implants available, medications/allergies/relevent history reviewed, required imaging and test results available. Performed.  Access:  RIGHT Radial Artery: 6 Fr sheath -- Seldinger technique using Micropuncture Kit Direct ultrasound guidance used.  Permanent image obtained and placed on chart. 10 mL radial cocktail IA; 3500 Units IV Heparin * Right Brachial/Antecubital Vein: The  existing 18-gauge IV was exchanged over a wire for a 5Fr  Sheath -> unfortunately, with a 5 [place] Swan-Ganz catheter was not able to advance beyond the shoulder despite use of Glidewire. -> Multiple attempts were made at brachial vein access using ultrasound guidance.  Although the vein was accessed, the wire would not pass beyond the shoulder and therefore I did not place the sheath.  Chose to abort and proceed with femoral venous access.  * RIGHT Common Femoral Vein: 7 Fr Sheath - Seldinger Technique.  Using 5 French micropuncture kit. --> Direct ultrasound guidance used.  Permanent image obtained and placed on chart.  Right Heart Catheterization: 7 Fr Gordy Councilman catheter advanced under fluoroscopy with balloon inflated to the RA, RV, then PCWP-PA for hemodynamic measurement.  * Simultaneous FA & PA blood gases checked for SaO2% to calculate FICK CO/CI  * Catheter removed completely out of the body with balloon deflated.  Coronary Angiography: 5Fr Tig 4.0 Catheters advanced or exchanged over a J-wire under direct fluoroscopic guidance into the ascending aorta;  * LV Hemodynamic --> aortic valve was not crossed, not measured * Left Coronary Artery Cineangiography: TIG 4.0 catheter  * Right Coronary Artery Cineangiography: TIG 4.0 catheter   Upon completion of Angiogaphy, the catheter was removed completely out of the body over a wire, without complication.  Femoral / Brachial Sheath(s) removed in the PACU holding area with manual pressure for hemostasis.    Radial sheath removed in the Cardiac Catheterization lab with TR Band placed for hemostasis.  TR Band: 1745  Hours; 16 mL air  MEDICATIONS * SQ Lidocaine 9m for radial access, a total of 10 for brachial and 10 for femoral * Radial Cocktail: 3 mg Verapmil in 10 mL NS * Isovue Contrast: 30 mL * Heparin: 3500 units  Estimated blood loss <50 mL.   During this procedure medications were administered to achieve and maintain moderate  conscious sedation while the patient's heart rate, blood pressure, and oxygen saturation were continuously monitored and I was present face-to-face 100% of this time.    Medications (Filter: Administrations occurring from 1610 to 1800 on 08/24/20)  lidocaine (PF) (XYLOCAINE) 1 % injection (mL) Total volume:  20 mL  Date/Time Rate/Dose/Volume Action   08/24/20 1634 4 mL Given   1641 2 mL Given   1653 4 mL Given   1727 10 mL Given    Heparin (Porcine) in NaCl 1000-0.9 UT/500ML-% SOLN (mL) Total volume:  1,000 mL  Date/Time Rate/Dose/Volume Action   08/24/20 1634 500 mL Given   1634 500 mL Given    Radial Cocktail/Verapamil only (mL)  Total volume:  10 mL  Date/Time Rate/Dose/Volume Action   08/24/20 1642 10 mL Given    heparin sodium (porcine) injection (Units) Total dose:  3,500 Units  Date/Time Rate/Dose/Volume Action   08/24/20 1706 3,500 Units Given    fentaNYL (SUBLIMAZE) injection (mcg) Total dose:  25 mcg  Date/Time Rate/Dose/Volume Action   08/24/20 1730 25 mcg Given    hydrALAZINE (APRESOLINE) injection (mg) Total dose:  10 mg  Date/Time Rate/Dose/Volume Action   08/24/20 1743 10 mg Given    iohexol (OMNIPAQUE) 350 MG/ML injection (mL) Total volume:  30 mL  Date/Time Rate/Dose/Volume Action   08/24/20 1746 30 mL Given       Contrast  Medication Name Total Dose  iohexol (OMNIPAQUE) 350 MG/ML injection 30 mL    Radiation/Fluoro  Fluoro time: 9.9 (min) DAP: 8631 (mGycm2) Cumulative Air Kerma: 300 (mGy)  Complications   Complications documented before study signed (08/24/2020  7:62 PM)    No complications were associated with this study.  Documented by Leonie Man, MD - 08/24/2020  5:55 PM      Coronary Findings   Diagnostic Dominance: Right  Left Main  Vessel was injected. Vessel is normal in caliber. Vessel is angiographically normal.  Left Anterior Descending  Vessel was injected. Vessel is normal in caliber. The vessel  exhibits minimal luminal irregularities. The vessel is moderately tortuous.  Second Diagonal Branch  Vessel is small in size.  Left Circumflex  Vessel was injected. Vessel is normal in caliber and moderate in size. Vessel is angiographically normal.  Right Coronary Artery  Vessel was injected. Vessel is normal in caliber. Vessel is angiographically normal. The vessel is moderately tortuous.  Prox RCA lesion is 20% stenosed. The lesion is focal and eccentric.  Acute Marginal Branch  Vessel is small in size.  Right Ventricular Branch  Vessel is small in size.  Right Posterior Descending Artery  Vessel is small in size. The vessel is tortuous.  First Right Posterolateral Branch  Vessel is small in size.   Intervention   No interventions have been documented.    Right Heart  Right Heart Pressures PAP-mean 37/8 mmHg - 22 mmHg PCWP mean 10 mmHg Aortic valve not crossed AoP 177/51 mmHg - 96 mmHg Ao sat 100%, PA sat 75%. Cardiac Output-Index: 5.63-3.5 (Fick)       Wall Motion  Resting    Aortic valve was not crossed.          Left Heart  Aortic Valve The aortic valve is calcified. Based on discussion with Dr. Acie Fredrickson, I did not cross the aortic valve.  There was concerned about recent stroke, I did not want to increase the chance of this being confused with crossing the valve related complication.    Coronary Diagrams   Diagnostic Dominance: Right    Intervention     Implants     No implant documentation for this case.    Syngo Images   Show images for CARDIAC CATHETERIZATION  Images on Long Term Storage   Show images for Reneta, Niehaus to Procedure Log  Procedure Log      Hemo Data  Flowsheet Row Most Recent Value  Fick Cardiac Output 5.63 L/min  Fick Cardiac Output Index 3.15 (L/min)/BSA  RA A Wave 2 mmHg  RA V Wave 1 mmHg  RA Mean 1 mmHg  RV Systolic Pressure 45 mmHg  RV Diastolic Pressure 0 mmHg  RV EDP 1 mmHg  PA Systolic  Pressure 37  mmHg  PA Diastolic Pressure 8 mmHg  PA Mean 22 mmHg  PW A Wave 12 mmHg  PW V Wave 5 mmHg  PW Mean 8 mmHg  AO Systolic Pressure 993 mmHg  AO Diastolic Pressure 51 mmHg  AO Mean 96 mmHg  QP/QS 1  TPVR Index 6.97 HRUI  TSVR Index 30.41 HRUI  PVR SVR Ratio 0.15  TPVR/TSVR Ratio 0.23   Cardiac CT 08/26/20: TECHNIQUE: A non-contrast, gated CT scan was obtained with axial slices of 3 mm through the heart for aortic valve calcium scoring. A 120 kV retrospective, gated, contrast cardiac scan was obtained. Gantry rotation speed was 250 msecs and collimation was 0.6 mm. Nitroglycerin was not given. The 3D data set was reconstructed in 5% intervals of the 0-95% of the R-R cycle. Systolic and diastolic phases were analyzed on a dedicated workstation using MPR, MIP, and VRT modes. The patient received 100 cc of contrast.   FINDINGS: Image quality: Good. There is cardiac motion artifact likely related to irregular rhythm versus ectopic beat.   Noise artifact is: Limited.   Valve Morphology: The aortic valve is tricuspid. There are severe diffuse calcifications. The [place] contains bulky calcification. The leaflets demonstrate severely reduced movement in systole.   Aortic Valve Calcium score: 1457   Aortic annular dimension:   Phase assessed: 35%   Annular area: 289 mm2   Annular perimeter: 61.2 mm   Max diameter: 21.1 mm   Min diameter: 17.8 mm   Annular and subannular calcification: None. There is LVOT calcification that is mild that extends under the [place] to the anterior mitral valve leaflet.   Optimal coplanar projection: LAO 10 CAU 9   Coronary Artery Height above Annulus:   Left Main: 12.8 mm   Right Coronary: 18.3 mm   Sinus of Valsalva Measurements:   Non-coronary: 27 mm   Right-coronary: 26 mm   Left-coronary: 25 mm   Average diameter: 25.8 mm   Sinus of Valsalva Height:   Non-coronary: 18.7 mm   Right-coronary: 20.6 mm   Left-coronary:  18.8 mm   Sinotubular Junction: 26 mm with mild calcification   Ascending Thoracic Aorta: 31 mm   Coronary Arteries: Normal coronary origin. Right dominance. The study was performed without use of NTG and is insufficient for plaque evaluation. Please refer to recent cardiac catheterization for coronary assessment. Mild coronary calcifications noted.   Cardiac Morphology:   Right Atrium: Right atrial size is dilated. Contrast reflux into the IVC consistent with elevated right atrial pressure.   Right Ventricle: The right ventricular cavity is within normal limits.   Left Atrium: Left atrial size is dilated in size with no left atrial appendage filling defect.   Left Ventricle: The ventricular cavity size is within normal limits. There are no stigmata of prior infarction. There is no abnormal filling defect. LV function is hyperdynamic, LVEF=87%. No regional wall motion abnormalities.   Pulmonary arteries: Dilated suggestive of pulmonary hypertension. No proximal filling defect.   Pulmonary veins: Normal pulmonary venous drainage.   Pericardium: Normal thickness with no significant effusion or calcium present.   Mitral Valve: The mitral valve is degenerative with moderate to severe annular calcification.   Extra-cardiac findings: See attached radiology report for non-cardiac structures.   IMPRESSION: 1. Tricuspid aortic valve with severe aortic stenosis (calcium score 1457).   2. Small annular measurements (292 mm2). Sinus measurements and heights support a 23 mm Evolut Pro TAVR.   3. No significant annular calcifications noted. There is mild LVOT calcification  under the [facility] that extends to the anterior mitral valve.   4.  Sufficient coronary to annulus distance.   5.  Optimal Fluoroscopic Angle for Delivery: LAO 10 CAU 9   6.  Dilated pulmonary artery suggestive of pulmonary hypertension.   7.  Moderate to severe mitral annular calcification noted.  Recent  Labs: 05/10/2020: ALT 11 08/23/2020: TSH 1.542 08/26/2020: BUN 14; Creatinine, Ser 0.85; Hemoglobin 12.8; Magnesium 2.0; Platelets 170; Potassium 3.9; Sodium 134    Wt Readings from Last 3 Encounters:  08/26/20 70.7 kg  05/31/20 74.8 kg  05/10/20 75.6 kg     Other studies Reviewed: Additional studies/ records that were reviewed today include: echo images, cath images, notes,EKG Review of the above records demonstrates: severe AS  STS Risk Score: Procedure: Isolated AVR Risk of Mortality: 2.961% Renal Failure: 2.111% Permanent Stroke: 4.307% Prolonged Ventilation: 9.893% DSW Infection: 0.063% Reoperation: 3.647% Morbidity or Mortality: 15.048% Short Length of Stay: 18.838% Long Length of Stay: 7.999%   _________________  [place]  KCCQ-12 08/26/2020  1 a. Ability to shower/bathe Slightly limited  1 b. Ability to walk 1 block Extremely limited  1 c. Ability to hurry/jog Other, Did not do  2. Edema feet/ankles/legs Never over the past 2 weeks  3. Limited by fatigue All of the time  4. Limited by dyspnea Never over the past 2 weeks  6. Limited enjoyment of life Limited quite a bit  7. Rest of life w/ symptoms Mostly dissatisfied  8 a. Participation in hobbies Moderately limited  8 b. Participation in chores Moderately limited  8 c. Visiting family/friends Did not limit at all     Assessment and Plan:   1. Severe Aortic Valve Stenosis: She has severe, stage D aortic valve stenosis. She has NYHA class II symptoms. I have personally reviewed the echo images. The aortic valve is thickened, calcified with limited leaflet mobility. I think she would benefit from AVR. Given advanced age, she is not a good candidate for conventional AVR by surgical approach. I think she may be a good candidate for TAVR.   I have reviewed the natural history of aortic stenosis with the patient and their family members  who are present today. We have discussed the  limitations of medical therapy and the poor prognosis associated with symptomatic aortic stenosis. We have reviewed potential treatment options, including palliative medical therapy, conventional surgical aortic valve replacement, and transcatheter aortic valve replacement. We discussed treatment options in the context of the patient's specific comorbid medical conditions.   She would like to proceed with planning for TAVR. Risks and benefits of the valve procedure are reviewed with the patient. Her stroke is not felt to be acute or clinically related to her syncopal event. Her left leg is immobilized at the knee. This should not prevent [place] from doing TAVR. She will need to be seen by one of the CT surgeons on our TAVR team. I think we should consider TAVR next week given her syncopal event. There is room on our schedule. Will wait for formal scheduling after she is seen by CT surgery.     Signed, Lauree Chandler, MD 08/26/2020 3:36 PM    [facility] [place], [place], [place]  [address] Phone: [phone]; Fax: [phone]

## 2020-08-26 NOTE — TOC Progression Note (Addendum)
Transition of Care [facility]) - Progression Note    Patient Details  Name: Terri King MRN: [id number] Date of Birth: May 11, 1934  Transition of [facility]) CM/SW Contact  Ivette Loyal, [place] Phone Number: 08/26/2020, 10:07 AM  Clinical Narrative:     1007: CSW following pt for DC needs, pt Berkley Harvey is pending, Tresa Endo with [facility] will follow up with CSW when Berkley Harvey is approved.  1155: Kelly with [employer] received auth but pt is not medically ready for DC. Tresa Endo will hold bed and said as long as pt DC Monday Berkley Harvey will still be good.   1330: CSW updated pt son about bed for [employer] and insurance. Pt son would like CSW to follow up Monday. CSW following for further DC planning needs.       Expected Discharge Plan and Services                                                 Social Determinants of Health (SDOH) Interventions    Readmission Risk Interventions No flowsheet data found.

## 2020-08-27 DIAGNOSIS — I350 Nonrheumatic aortic (valve) stenosis: Secondary | ICD-10-CM

## 2020-08-27 DIAGNOSIS — S72415A Nondisplaced unspecified condyle fracture of lower end of left femur, initial encounter for closed fracture: Secondary | ICD-10-CM

## 2020-08-27 LAB — BASIC METABOLIC PANEL
Anion gap: 5 (ref 5–15)
BUN: 13 mg/dL (ref 8–23)
CO2: 25 mmol/L (ref 22–32)
Calcium: 9.5 mg/dL (ref 8.9–10.3)
Chloride: 103 mmol/L (ref 98–111)
Creatinine, Ser: 0.99 mg/dL (ref 0.44–1.00)
GFR, Estimated: 56 mL/min — ABNORMAL LOW (ref >60)
Glucose, Bld: 117 mg/dL — ABNORMAL HIGH (ref 70–99)
Potassium: 3.8 mmol/L (ref 3.5–5.1)
Sodium: 133 mmol/L — ABNORMAL LOW (ref 135–145)

## 2020-08-27 LAB — MAGNESIUM: Magnesium: 2 mg/dL (ref 1.7–2.4)

## 2020-08-27 LAB — CBC WITH DIFFERENTIAL/PLATELET
Abs Immature Granulocytes: 0.19 10*3/uL — ABNORMAL HIGH (ref 0.00–0.07)
Basophils Absolute: 0 10*3/uL (ref 0.0–0.1)
Basophils Relative: 1 %
Eosinophils Absolute: 0.3 10*3/uL (ref 0.0–0.5)
Eosinophils Relative: 4 %
HCT: 34.6 % — ABNORMAL LOW (ref 36.0–46.0)
Hemoglobin: 12.1 g/dL (ref 12.0–15.0)
Immature Granulocytes: 2 %
Lymphocytes Relative: 23 %
Lymphs Abs: 2.1 10*3/uL (ref 0.7–4.0)
MCH: 32.3 pg (ref 26.0–34.0)
MCHC: 35 g/dL (ref 30.0–36.0)
MCV: 92.3 fL (ref 80.0–100.0)
Monocytes Absolute: 1.2 10*3/uL — ABNORMAL HIGH (ref 0.1–1.0)
Monocytes Relative: 14 %
Neutro Abs: 5 10*3/uL (ref 1.7–7.7)
Neutrophils Relative %: 56 %
Platelets: 240 10*3/uL (ref 150–400)
RBC: 3.75 MIL/uL — ABNORMAL LOW (ref 3.87–5.11)
RDW: 13.4 % (ref 11.5–15.5)
WBC: 8.8 10*3/uL (ref 4.0–10.5)
nRBC: 0 % (ref 0.0–0.2)

## 2020-08-27 MED ORDER — MORPHINE SULFATE (PF) 2 MG/ML IV SOLN: 1.0000 mg | INTRAVENOUS | Status: DC | PRN

## 2020-08-27 NOTE — Consult Note (Signed)
[address]       [place] [address]             [phone]          CARDIOTHORACIC SURGERY CONSULTATION REPORT  PCP is [name], Evie Lacks, MD Referring Provider is Burnell Blanks, MD Primary Cardiologist is Dorris Carnes, MD  Reason for consultation:  severe aortic stenosis  HPI:  Patient is an 86 year old African-American female with history of aortic stenosis, hypertension, hyperlipidemia, remote syncopal event in 2018 and osteoarthritis who has been referred for surgical consultation to discuss treatment options for management of severe aortic stenosis following recent syncopal event.  Patient has been followed for the last several years by Dr. Harrington Challenger with known history of aortic stenosis and nonobstructive coronary artery disease.  Most recent follow-up echocardiogram performed February 2021 revealed findings consistent with normal left ventricular systolic function and moderate aortic stenosis.  Patient states that she was in her usual state of health until August 19, 2020 after which time she had suffered a syncopal event the previous day.  At the time she was alone and climbing stairs in her house.  She states that when she came to she was too weak and having pain in her leg such that she could not get herself back up and on her feet.  She was found by her son the next day and brought to the emergency department.  She has suffered left femur fracture.  Neurologic work-up revealed findings consistent with remote history of occipital stroke.  Following completion of their work-up they were not convinced that the patient's syncopal event was related to a stroke or other neurologic event.  Transthoracic echocardiogram revealed normal left ventricular systolic function with severe aortic stenosis.  She was seen by orthopedic surgery because of her femur fracture but given the presence of syncope with severe aortic stenosis, conservative nonoperative therapy was  recommended for the time being.  The patient was evaluated by the cardiology team and referred to the multidisciplinary heart valve team.  Diagnostic cardiac catheterization revealed normal coronary artery anatomy with nonobstructive coronary artery disease.  She has been evaluated previously with Dr. Angelena Form and undergo CT angiography to evaluate the feasibility of transcatheter aortic valve replacement.  Cardiothoracic surgical consultation was requested.  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  She lives alone in [place].  She has 1 adult son who lives in [place] and currently is at the bedside for her consultation visit today.  A second adult son lives in [place].  Patient states that up until recently she has been entirely functionally independent.  She still drives an automobile.  She did have a syncopal episode and a fall in 2018 at which time she broke several ribs.  She states that more recently she has had to ambulate using a cane or a rolling walker because of balance issues.  She admits to chronic symptoms of exertional shortness of breath with more strenuous exertion.  She has never had any chest pain or chest tightness.  She does not experience any dizziness.  Since hospital admission she reports no specific complaints.  She has been mobilized to a chair with nonweightbearing on her broken leg.    Past Medical History:  Diagnosis Date   Family history of anesthesia complication    NIENCE had sensitivity   History of pneumonia 2008   HTN (hypertension)    dr Alain Marion  Hyperlipemia    Osteoarthritis    Severe aortic stenosis     Past Surgical History:  Procedure Laterality Date   JOINT REPLACEMENT  12/13   L TKR    KNEE ARTHROSCOPY     left   PARTIAL HYSTERECTOMY     RIGHT HEART CATH AND CORONARY ANGIOGRAPHY N/A 08/24/2020   Procedure: RIGHT HEART CATH AND CORONARY ANGIOGRAPHY;  Surgeon: Leonie Man, MD;   Location: [facility];  Service: Cardiovascular;  Laterality: N/A;   TONSILLECTOMY     TOTAL KNEE ARTHROPLASTY  01/23/2012   Procedure: TOTAL KNEE ARTHROPLASTY;  Surgeon: Ninetta Lights, MD;  Location: [facility];  Service: Orthopedics;  Laterality: Left;   TOTAL KNEE ARTHROPLASTY  12/'05/2011   left knee    Family History  Problem Relation Age of Onset   Glaucoma Father    Hypertension Mother    Hypertension Other     Social History   Socioeconomic History   Marital status: Widowed    Spouse name: Not on file   Number of children: Not on file   Years of education: Not on file   Highest education level: Not on file  Occupational History   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)  Tobacco Use   Smoking status: Never   Smokeless tobacco: Never  Vaping Use   Vaping Use: Never used  Substance and Sexual Activity   Alcohol use: No   Drug use: No   Sexual activity: Never  Other Topics Concern   Not on file  Social History Narrative   Not on file   Social Determinants of Health   Financial Resource Strain: Low Risk    Difficulty of Paying Living Expenses: Not hard at all  Food Insecurity: No Food Insecurity   Worried About Charity fundraiser in the Last Year: Never true   [place] in the Last Year: Never true  Transportation Needs: No Transportation Needs   Lack of Transportation (Medical): No   Lack of Transportation (Non-Medical): No  Physical Activity: Insufficiently Active   Days of Exercise per Week: 7 days   Minutes of Exercise per Session: 20 min  Stress: No Stress Concern Present   Feeling of Stress : Not at all  Social Connections: Moderately Integrated   Frequency of Communication with Friends and Family: More than three times a week   Frequency of Social Gatherings with Friends and Family: Once a week   Attends Religious Services: 1 to 4 times per year   Active Member of [employer] or Organizations: No   Attends Theatre manager Meetings: 1 to 4 times per year   Marital Status: Widowed  Human resources officer Violence: Not At Risk   Fear of Current or Ex-Partner: No   Emotionally Abused: No   Physically Abused: No   Sexually Abused: No    Prior to Admission medications   Medication Sig Start Date End Date Taking? Authorizing Provider  Alcohol Swabs (B-D SINGLE USE SWABS REGULAR) PADS 1 each by Does not apply route as directed. Dx: R73.9 02/23/20  Yes [name], Evie Lacks, MD  amLODipine (NORVASC) 5 MG tablet TAKE 1 TABLET EVERY DAY 04/10/20  Yes [name], Evie Lacks, MD  Ascorbic Acid (VITAMIN C PO) Take 1 tablet by mouth daily.   Yes [provider]  Blood Glucose Calibration (TRUE METRIX LEVEL 1) Low SOLN 1 each by In Vitro route as directed. Dx: R73.9 02/23/20  Yes [name], Tyrone Apple  V, MD  Blood Glucose Monitoring Suppl (TRUE METRIX METER) w/Device KIT USE AS DIRECTED 04/28/20  Yes [name], Evie Lacks, MD  cholecalciferol (VITAMIN D) 400 UNITS TABS Take 400 Units by mouth daily.   Yes [provider]  escitalopram (LEXAPRO) 10 MG tablet Take 1 tablet (10 mg total) by mouth daily. 06/01/20  Yes [name], Evie Lacks, MD  gabapentin (NEURONTIN) 100 MG capsule TAKE 1 CAPSULE THREE TIMES DAILY 06/30/20  Yes [name], Evie Lacks, MD  glucose blood (TRUE METRIX BLOOD GLUCOSE TEST) test strip Use to check blood sugars twice a day 02/23/20  Yes [name], Evie Lacks, MD  ibuprofen (IBU) 600 MG tablet TAKE 1 TABLET EVERY DAY AS NEEDED FOR MODERATE PAIN 05/26/20  Yes [name], Evie Lacks, MD  Krill Oil (MAXIMUM RED KRILL) 300 MG CAPS 1 po qd 07/22/13  Yes [name], Evie Lacks, MD  metoprolol succinate (TOPROL-XL) 50 MG 24 hr tablet TAKE 1 TABLET (50 MG TOTAL) BY MOUTH AT BEDTIME / WITH OR IMMEDIATELY FOLLOWING A MEAL AS DIRECTED 04/10/20  Yes [name], Evie Lacks, MD  Multiple Vitamins-Minerals (MULTIPLE VITAMINS/WOMENS PO) Take by mouth daily.    Yes [provider]  nitroGLYCERIN (NITROSTAT) 0.4 MG SL  tablet Place 1 tablet (0.4 mg total) under the tongue every 5 (five) minutes as needed for chest pain. 03/20/19  Yes Weaver, Scott T, PA-C  potassium chloride SA (KLOR-CON) 20 MEQ tablet Take 20 mEq by mouth daily. 02/29/20  Yes [provider]  REPATHA SURECLICK 315 MG/ML SOAJ INJECT 1 PEN INTO THE SKIN EVERY 14 DAYS 03/15/20  Yes Fay Records, MD  temazepam (RESTORIL) 30 MG capsule TAKE 1 CAPSULE AT BEDTIME AS NEEDED  FOR  SLEEP 05/26/20  Yes [name], Evie Lacks, MD  triamterene-hydrochlorothiazide (MAXZIDE-25) 37.5-25 MG tablet TAKE 1 TABLET EVERY DAY 04/10/20  Yes [name], Evie Lacks, MD  TRUEplus Lancets 33G MISC Use to check blood sugars twice a day 02/23/20  Yes [name], Evie Lacks, MD  valACYclovir (VALTREX) 1000 MG tablet Take 1 tablet (1,000 mg total) by mouth 3 (three) times daily. Patient taking differently: Take 1,000 mg by mouth 3 (three) times daily as needed. 08/13/17  Yes [name], Evie Lacks, MD  vitamin B-12 (CYANOCOBALAMIN) 100 MCG tablet Take 100 mcg by mouth daily.   Yes [provider]  Vitamin E 400 units TABS Take by mouth daily.    Yes [provider]  famotidine (PEPCID) 40 MG tablet TAKE 1 TABLET (40 MG TOTAL) BY MOUTH DAILY. ANNUAL APPT DUE IN JUNE MUST SEE PROVIDER FOR FUTURE REFILLS Patient not taking: Reported on 08/20/2020 04/10/20   [name], Evie Lacks, MD    Current Facility-Administered Medications  Medication Dose Route Frequency Provider Last Rate Last Admin   0.9 %  sodium chloride infusion  250 mL Intravenous PRN Leonie Man, MD       amLODipine (NORVASC) tablet 5 mg  5 mg Oral Daily Leonie Man, MD   5 mg at 08/27/20 0858   aspirin EC tablet 81 mg  81 mg Oral Daily Greta Doom, MD   81 mg at 08/27/20 0858   enoxaparin (LOVENOX) injection 30 mg  30 mg Subcutaneous Q24H Leonie Man, MD   30 mg at 08/27/20 0859   escitalopram (LEXAPRO) tablet 10 mg  10 mg Oral Daily Leonie Man, MD   10 mg at 08/27/20 0858    famotidine (PEPCID) tablet 40 mg  40 mg Oral Daily Leonie Man, MD  40 mg at 08/27/20 0858   gabapentin (NEURONTIN) capsule 100 mg  100 mg Oral TID Leonie Man, MD   100 mg at 08/27/20 0858   HYDROcodone-acetaminophen (NORCO/VICODIN) 5-325 MG per tablet 1 tablet  1 tablet Oral Q6H PRN Little Ishikawa, MD   1 tablet at 08/26/20 1613   ibuprofen (ADVIL) tablet 200 mg  200 mg Oral Q6H PRN Leonie Man, MD   200 mg at 08/25/20 0507   metoprolol succinate (TOPROL-XL) 24 hr tablet 50 mg  50 mg Oral Daily Leonie Man, MD   50 mg at 08/27/20 0858   morphine 2 MG/ML injection 1 mg  1 mg Intravenous Q4H PRN Shelly Coss, MD       nitroGLYCERIN (NITROSTAT) SL tablet 0.4 mg  0.4 mg Sublingual Q5 min PRN Leonie Man, MD   0.4 mg at 08/25/20 1659   ondansetron (ZOFRAN) injection 4 mg  4 mg Intravenous Q6H PRN Leonie Man, MD       QUEtiapine (SEROQUEL) tablet 25 mg  25 mg Oral QHS Leonie Man, MD   25 mg at 08/26/20 2120   senna-docusate (Senokot-S) tablet 1 tablet  1 tablet Oral QHS PRN Leonie Man, MD       sodium chloride flush (NS) 0.9 % injection 3 mL  3 mL Intravenous Q12H Leonie Man, MD   3 mL at 08/26/20 2010   sodium chloride flush (NS) 0.9 % injection 3 mL  3 mL Intravenous Q12H Leonie Man, MD   3 mL at 08/26/20 2120   sodium chloride flush (NS) 0.9 % injection 3 mL  3 mL Intravenous PRN Leonie Man, MD       temazepam (RESTORIL) capsule 15 mg  15 mg Oral QHS Leonie Man, MD   15 mg at 08/26/20 2120   triamterene-hydrochlorothiazide (MAXZIDE-25) 37.5-25 MG per tablet 1 tablet  1 tablet Oral Daily Leonie Man, MD   1 tablet at 08/27/20 1001    Allergies  Allergen Reactions   Codeine Other (See Comments)    Abnormal behavior   Crestor [Rosuvastatin]     Myalgias    Dexilant [Dexlansoprazole]     Made me sick   Ibuprofen Nausea And Vomiting   Simvastatin     REACTION: achy   Tylenol [Acetaminophen]    Iodine Swelling  and Rash      Review of Systems:   General:  normal appetite, decreased energy, no weight gain, no weight loss, no fever  Cardiac:  no chest pain with exertion, no chest pain at rest, +SOB with exertion, no resting SOB, no PND, no orthopnea, no palpitations, no arrhythmia, no atrial fibrillation, no LE edema, no dizzy spells, + syncope  Respiratory:  no shortness of breath, no home oxygen, no productive cough, no dry cough, no bronchitis, no wheezing, no hemoptysis, no asthma, no pain with inspiration or cough, no sleep apnea, no CPAP at night  GI:   no difficulty swallowing, no reflux, no frequent heartburn, no hiatal hernia, no abdominal pain, no constipation, no diarrhea, no hematochezia, no hematemesis, no melena  GU:   no dysuria,  no frequency, no urinary tract infection, no hematuria, no kidney stones, no kidney disease  Vascular:  no pain suggestive of claudication, no pain in feet, no leg cramps, no varicose veins, no DVT, no non-healing foot ulcer  Neuro:   no stroke, no TIA's, no seizures, no headaches, no temporary blindness one eye,  no slurred speech, no peripheral neuropathy, no chronic pain, mild instability of gait, no memory/cognitive dysfunction  Musculoskeletal: + arthritis, no joint swelling, no myalgias, minor difficulty walking, decreased mobility   Skin:   no rash, no itching, no skin infections, no pressure sores or ulcerations  Psych:   no anxiety, no depression, no nervousness, no unusual recent stress  Eyes:   no blurry vision, no floaters, no recent vision changes, no wears glasses or contacts  ENT:   no hearing loss, no loose or painful teeth  Hematologic:  no easy bruising, no abnormal bleeding, no clotting disorder, no frequent epistaxis  Endocrine:  no diabetes, does not check CBG's at home     Physical Exam:   BP (!) 158/56 (BP Location: Left Arm)   Pulse 71   Temp 98.7 F (37.1 C) (Oral)   Resp 18   Ht '5\' 3"'  (1.6 m)   Wt 68.5 kg   SpO2 99%   BMI  26.75 kg/m   General:  Elderly,  well-appearing  HEENT:  Unremarkable   Neck:   no JVD, no bruits, no adenopathy   Chest:   clear to auscultation, symmetrical breath sounds, no wheezes, no rhonchi   CV:   RRR, grade III/VI systolic murmur best LLSB  Abdomen:  soft, non-tender, no masses   Extremities:  warm, well-perfused, pulses palpable, no lower extremity edema  Rectal/GU  Deferred  Neuro:   Grossly non-focal and symmetrical throughout  Skin:   Clean and dry, no rashes, no breakdown  Diagnostic Tests:  Lab Results: Recent Labs    08/26/20 0544 08/27/20 0134  WBC 10.2 8.8  HGB 12.8 12.1  HCT 37.6 34.6*  PLT 170 240   BMET:  Recent Labs    08/26/20 0544 08/27/20 0134  NA 134* 133*  K 3.9 3.8  CL 104 103  CO2 21* 25  GLUCOSE 109* 117*  BUN 14 13  CREATININE 0.85 0.99  CALCIUM 9.8 9.5    CBG (last 3)  No results for input(s): GLUCAP in the last 72 hours. PT/INR:  No results for input(s): LABPROT, INR in the last 72 hours.  CXR: CHEST  1 VIEW   COMPARISON:  Radiograph 09/02/2015. Shoulder radiograph reported separately.   FINDINGS: Stable heart size and mediastinal contours. Aortic atherosclerosis. Mitral annulus calcifications. No pneumothorax or focal airspace disease. No pleural effusion. Remote left rib fractures. No acute rib fractures are seen.   IMPRESSION: 1. No acute finding. 2. Remote left rib fractures.     Electronically Signed   By: Keith Rake M.D.   On: 08/19/2020 18:51   ECHOCARDIOGRAM REPORT         Patient Name:   LUCETTA BAEHR Date of Exam: 08/20/2020  Medical Rec #:  [id number]       Height:       63.0 in  Accession #:    [id number]      Weight:       165.7 lb  Date of Birth:  [birth year]       BSA:          1.785 m  Patient Age:    57 years        BP:           248/59 mmHg  Patient Gender: F               HR:           79 bpm.  Exam Location:  Inpatient  Procedure: 2D Echo, 3D Echo, Cardiac Doppler, Color Doppler and  Strain  Analysis   Indications:    I35.0 Nonrheumatic aortic (valve) stenosis     History:        Patient has prior history of Echocardiogram examinations,  most                  recent 04/08/2019. Aortic Valve Disease,  Signs/Symptoms:Syncope,                  Fever and Chest Pain; Risk Factors:Hypertension and                  Dyslipidemia. Edema. Aortic stenosis.     Sonographer:    Roseanna Rainbow RDCS  Referring Phys: [id number] Eben Burow      Sonographer Comments: Global longitudinal strain was attempted. Patient  supine. Could not turn due to broken leg.  IMPRESSIONS     1. The aortic valve is calcified. There is severe calcifcation of the  aortic valve. There is severe thickening of the aortic valve. Aortic valve  regurgitation is mild. Severe aortic valve stenosis. AVA 0.9cm2, mean  gradient 100mHg, peak gradient 625mg,   Vmax 4.38m79m  2. Left ventricular ejection fraction, by estimation, is 65 to 70%. Left  ventricular ejection fraction by 3D volume is 71 %. The left ventricle has  normal function. The left ventricle has no regional wall motion  abnormalities. There is moderate  asymmetric hypertrophy of the basal-septum. The rest of the LV segments  demonstrate mild left ventricular hypertrophy. Left ventricular diastolic  parameters are consistent with Grade I diastolic dysfunction (impaired  relaxation). Elevated left atrial  pressure. The average left ventricular global longitudinal strain is -20.2  %. The global longitudinal strain is normal.   3. Right ventricular systolic function is normal. The right ventricular  size is normal. There is severely elevated pulmonary artery systolic  pressure. The estimated right ventricular systolic pressure is 86.22.0Hg.   4. The mitral valve is abnormal. There is moderate thickening of the  mitral valve leaflet(s). There is moderate calcification of the mitral  valve leaflet(s). Moderate to severe mitral annular  calcification. Trivial  mitral valve regurgitation. Mild  calcific mitral stenosis with MVA 1.5cm2 by continuity, mean gradient  4mm69mat HR 74bpm.   5. The inferior vena cava is dilated in size with >50% respiratory  variability, suggesting right atrial pressure of 8 mmHg.   Comparison(s): Compared to prior TTE in 03/2019, the aortic stenosis is  now severe (previously moderate) and there is severe pulmonary  hypertension with PASP 67mm78m  FINDINGS   Left Ventricle: Left ventricular ejection fraction, by estimation, is 65  to 70%. Left ventricular ejection fraction by 3D volume is 71 %. The left  ventricle has normal function. The left ventricle has no regional wall  motion abnormalities. The average  left ventricular global longitudinal strain is -20.2 %. The global  longitudinal strain is normal. The left ventricular internal cavity size  was normal in size. There is moderate asymmetric hypertrophy of the  basal-septum. The rest of the LV segments  demonstrate mild left ventricular hypertrophy. Left ventricular diastolic  parameters are consistent with Grade I diastolic dysfunction (impaired  relaxation). Elevated left atrial pressure.   Right Ventricle: The right ventricular size is normal. No increase in  right ventricular wall thickness. Right ventricular systolic function is  normal. There is severely elevated pulmonary artery systolic pressure. The  tricuspid regurgitant velocity  is  4.22 m/s, and with an assumed right atrial pressure of 15 mmHg, the  estimated right ventricular systolic pressure is 70.1 mmHg.   Left Atrium: Left atrial size was normal in size.   Right Atrium: Right atrial size was normal in size.   Pericardium: There is no evidence of pericardial effusion.   Mitral Valve: The mitral valve is abnormal. There is moderate thickening  of the mitral valve leaflet(s). There is moderate calcification of the  mitral valve leaflet(s). Moderate to severe  mitral annular calcification.  Trivial mitral valve regurgitation.  MV peak gradient, 14.8 mmHg. The mean mitral valve gradient is 4.0 mmHg.  Mild calcific mitral stenosis with MVA 1.5cm2 by continuity, mean gradient  29mHg at HR 74bpm.   Tricuspid Valve: The tricuspid valve is normal in structure. Tricuspid  valve regurgitation is mild.   Aortic Valve: There is severe calcifcation of the aortic valve. There is  severe thickening of the aortic valve. Aortic valve regurgitation is mild.  Aortic regurgitation PHT measures 426 msec. Severe aortic stenosis is  present. AVA 0.9cm2, mean gradient  347mg, peak gradient 6772m, Vmax 4.24m/20m  Pulmonic Valve: The pulmonic valve was not well visualized. Pulmonic valve  regurgitation is trivial.   Aorta: The aortic root and ascending aorta are structurally normal, with  no evidence of dilitation.   Venous: The inferior vena cava is dilated in size with greater than 50%  respiratory variability, suggesting right atrial pressure of 8 mmHg.   IAS/Shunts: The interatrial septum is aneurysmal. No atrial level shunt  detected by color flow Doppler.      LEFT VENTRICLE  PLAX 2D  LVIDd:         2.90 cm         Diastology  LVIDs:         1.70 cm         LV e' medial:    5.22 cm/s  LV PW:         1.50 cm         LV E/e' medial:  22.5  LV IVS:        1.40 cm         LV e' lateral:   7.51 cm/s  LVOT diam:     1.80 cm         LV E/e' lateral: 15.6  LV SV:         68  LV SV Index:   38              2D  LVOT Area:     2.54 cm        Longitudinal                                 Strain                                 2D Strain GLS  -20.2 %  LV Volumes (MOD)               Avg:  LV vol d, MOD    57.6 ml  A2C:                           3D Volume EF  LV vol d, MOD    48.3 ml  LV 3D EF:    Left  A4C:                                        ventricular  LV vol s, MOD    17.3 ml                    ejection  A2C:                                         fraction by  LV vol s, MOD    11.3 ml                    3D volume  A4C:                                        is 71 %.  LV SV MOD A2C:   40.3 ml  LV SV MOD A4C:   48.3 ml  LV SV MOD BP:    39.0 ml       3D Volume EF:                                 3D EF:        71 %   RIGHT VENTRICLE             IVC  RV S prime:     11.90 cm/s  IVC diam: 2.00 cm  TAPSE (M-mode): 2.2 cm   LEFT ATRIUM             Index       RIGHT ATRIUM           Index  LA diam:        3.70 cm 2.07 cm/m  RA Area:     14.40 cm  LA Vol (A2C):   40.8 ml 22.86 ml/m RA Volume:   35.60 ml  19.94 ml/m  LA Vol (A4C):   32.2 ml 18.04 ml/m  LA Biplane Vol: 37.8 ml 21.18 ml/m   AORTIC VALVE  AV Area (Vmax):    0.94 cm  AV Area (Vmean):   0.92 cm  AV Area (VTI):     0.92 cm  AV Vmax:           381.00 cm/s  AV Vmean:          265.333 cm/s  AV VTI:            0.743 m  AV Peak Grad:      58.1 mmHg  AV Mean Grad:      33.0 mmHg  LVOT Vmax:         140.00 cm/s  LVOT Vmean:        95.900 cm/s  LVOT VTI:          0.269 m  LVOT/AV VTI ratio: 0.36  AI PHT:            426 msec     AORTA  Ao Root diam: 2.80 cm  Ao Asc diam:  3.10 cm   MITRAL VALVE  TRICUSPID VALVE  MV Area (PHT): 2.79 cm     TR Peak grad:   71.2 mmHg  MV Area VTI:   1.38 cm     TR Vmax:        422.00 cm/s  MV Peak grad:  14.8 mmHg  MV Mean grad:  4.0 mmHg     SHUNTS  MV Vmax:       1.92 m/s     Systemic VTI:  0.27 m  MV Vmean:      90.3 cm/s    Systemic Diam: 1.80 cm  MV Decel Time: 272 msec  MV E velocity: 117.50 cm/s  MV A velocity: 175.00 cm/s  MV E/A ratio:  0.67   Gwyndolyn Kaufman MD  Electronically signed by Gwyndolyn Kaufman MD  Signature Date/Time: 08/20/2020/2:56:15 PM      EKG: NSR w/out significant AV conduction delay  (08/23/2020)    RIGHT HEART CATH AND CORONARY ANGIOGRAPHY    Conclusion    Normal Right Heart Cath Pressures-PAP 37/8 mmHg with a mean of 22 mmHg. Prox RCA lesion is 20% stenosed.-Otherwise  minimal if any CAD with very tortuous vessels.   SUMMARY Documented essentially severe aortic stenosis on echocardiogram.  Aortic valve not crossed. Angiographically normal coronary arteries but very tortuous consistent with hypertensive heart disease Severe systemic hypertension with pressures ranging from 170 to 599 mmHg systolic. Normal Right Heart Cath Pressures As Well As Cardiac Output and Index     RECOMMENDATIONS Continue evaluation for syncope, if considered to be related to aortic stenosis, would likely need to wait until leg healed. Need more aggressive blood pressure management.     Glenetta Hew, MD      Recommendations  Antiplatelet/Anticoag No indication for antiplatelet therapy at this time .  Discharge Date Anticipated discharge date to be determined.     Indications  Syncope and collapse [J57 (ICD-10-CM)]  Essential hypertension [I10 (ICD-10-CM)]  Aortic valve stenosis, etiology of cardiac valve disease unspecified [I35.0 (ICD-10-CM)]    Procedural Details  Technical Details Primary Care Provider:[name], Evie Lacks, MD [facility] Cardiologist: Dorris Carnes, MD  Referring Cardiologist: Dr. Acie Fredrickson   86 y.o. female with a hx of aortic stenosis, hypertension, hyperlipidemia and osteoarthritis who was seen by Dr. Acie Fredrickson for the evaluation of syncope and aortic stenosis at the request of Dr. Sabino Gasser. Found to have small stroke on MRI of brain.  Based on syncope is a potential symptom of aortic stenosis, she is referred for right left heart catheterization.  With concern for possible recent stroke, we decided to avoid crossing the valve at this time.  Time Out: Verified patient identification, verified procedure, site/side was marked, verified correct patient position, special equipment/implants available, medications/allergies/relevent history reviewed, required imaging and test results available. Performed.  Access:  RIGHT Radial Artery: 6 Fr sheath --  Seldinger technique using Micropuncture Kit Direct ultrasound guidance used.  Permanent image obtained and placed on chart. 10 mL radial cocktail IA; 3500 Units IV Heparin * Right Brachial/Antecubital Vein: The existing 18-gauge IV was exchanged over a wire for a 5Fr  Sheath -> unfortunately, with a 5 [place] Swan-Ganz catheter was not able to advance beyond the shoulder despite use of Glidewire. -> Multiple attempts were made at brachial vein access using ultrasound guidance.  Although the vein was accessed, the wire would not pass beyond the shoulder and therefore I did not place the sheath.  Chose to abort and proceed with femoral venous access.  * RIGHT Common Femoral Vein: 7 Fr  Sheath - Seldinger Technique.  Using 5 French micropuncture kit. --> Direct ultrasound guidance used.  Permanent image obtained and placed on chart.  Right Heart Catheterization: 7 Fr Gordy Councilman catheter advanced under fluoroscopy with balloon inflated to the RA, RV, then PCWP-PA for hemodynamic measurement.  * Simultaneous FA & PA blood gases checked for SaO2% to calculate FICK CO/CI  * Catheter removed completely out of the body with balloon deflated.  Coronary Angiography: 5Fr Tig 4.0 Catheters advanced or exchanged over a J-wire under direct fluoroscopic guidance into the ascending aorta;  * LV Hemodynamic --> aortic valve was not crossed, not measured * Left Coronary Artery Cineangiography: TIG 4.0 catheter  * Right Coronary Artery Cineangiography: TIG 4.0 catheter   Upon completion of Angiogaphy, the catheter was removed completely out of the body over a wire, without complication.  Femoral / Brachial Sheath(s) removed in the PACU holding area with manual pressure for hemostasis.    Radial sheath removed in the Cardiac Catheterization lab with TR Band placed for hemostasis.  TR Band: 1745  Hours; 16 mL air  MEDICATIONS * SQ Lidocaine 8m for radial access, a total of 10 for brachial and 10 for femoral *  Radial Cocktail: 3 mg Verapmil in 10 mL NS * Isovue Contrast: 30 mL * Heparin: 3500 units  Estimated blood loss <50 mL.   During this procedure medications were administered to achieve and maintain moderate conscious sedation while the patient's heart rate, blood pressure, and oxygen saturation were continuously monitored and I was present face-to-face 100% of this time.    Medications (Filter: Administrations occurring from 1610 to 1800 on 08/24/20)  lidocaine (PF) (XYLOCAINE) 1 % injection (mL) Total volume:  20 mL  Date/Time Rate/Dose/Volume Action   08/24/20 1634 4 mL Given   1641 2 mL Given   1653 4 mL Given   1727 10 mL Given    Heparin (Porcine) in NaCl 1000-0.9 UT/500ML-% SOLN (mL) Total volume:  1,000 mL  Date/Time Rate/Dose/Volume Action   08/24/20 1634 500 mL Given   1634 500 mL Given    Radial Cocktail/Verapamil only (mL) Total volume:  10 mL  Date/Time Rate/Dose/Volume Action   08/24/20 1642 10 mL Given    heparin sodium (porcine) injection (Units) Total dose:  3,500 Units  Date/Time Rate/Dose/Volume Action   08/24/20 1706 3,500 Units Given    fentaNYL (SUBLIMAZE) injection (mcg) Total dose:  25 mcg  Date/Time Rate/Dose/Volume Action   08/24/20 1730 25 mcg Given    hydrALAZINE (APRESOLINE) injection (mg) Total dose:  10 mg  Date/Time Rate/Dose/Volume Action   08/24/20 1743 10 mg Given    iohexol (OMNIPAQUE) 350 MG/ML injection (mL) Total volume:  30 mL  Date/Time Rate/Dose/Volume Action   08/24/20 1746 30 mL Given       Contrast  Medication Name Total Dose  iohexol (OMNIPAQUE) 350 MG/ML injection 30 mL    Radiation/Fluoro  Fluoro time: 9.9 (min) DAP: 8631 (mGycm2) Cumulative Air Kerma: 1182(mGy)  Complications   Complications documented before study signed (08/24/2020  69:93PM)    No complications were associated with this study.  Documented by HLeonie Man MD - 08/24/2020  5:55 PM      Coronary  Findings   Diagnostic Dominance: Right  Left Main  Vessel was injected. Vessel is normal in caliber. Vessel is angiographically normal.  Left Anterior Descending  Vessel was injected. Vessel is normal in caliber. The vessel exhibits minimal luminal irregularities. The vessel is moderately tortuous.  Second Diagonal Branch  Vessel is small in size.  Left Circumflex  Vessel was injected. Vessel is normal in caliber and moderate in size. Vessel is angiographically normal.  Right Coronary Artery  Vessel was injected. Vessel is normal in caliber. Vessel is angiographically normal. The vessel is moderately tortuous.  Prox RCA lesion is 20% stenosed. The lesion is focal and eccentric.  Acute Marginal Branch  Vessel is small in size.  Right Ventricular Branch  Vessel is small in size.  Right Posterior Descending Artery  Vessel is small in size. The vessel is tortuous.  First Right Posterolateral Branch  Vessel is small in size.   Intervention   No interventions have been documented.    Right Heart  Right Heart Pressures PAP-mean 37/8 mmHg - 22 mmHg PCWP mean 10 mmHg Aortic valve not crossed AoP 177/51 mmHg - 96 mmHg Ao sat 100%, PA sat 75%. Cardiac Output-Index: 5.63-3.5 (Fick)       Wall Motion  Resting    Aortic valve was not crossed.          Left Heart  Aortic Valve The aortic valve is calcified. Based on discussion with Dr. Acie Fredrickson, I did not cross the aortic valve.  There was concerned about recent stroke, I did not want to increase the chance of this being confused with crossing the valve related complication.    Coronary Diagrams   Diagnostic Dominance: Right    Intervention     Implants     No implant documentation for this case.    Syngo Images   Show images for CARDIAC CATHETERIZATION  Images on Long Term Storage   Show images for [place], [place] to Procedure Log  Procedure Log      Hemo Data  Flowsheet Row Most  Recent Value  Fick Cardiac Output 5.63 L/min  Fick Cardiac Output Index 3.15 (L/min)/BSA  RA A Wave 2 mmHg  RA V Wave 1 mmHg  RA Mean 1 mmHg  RV Systolic Pressure 45 mmHg  RV Diastolic Pressure 0 mmHg  RV EDP 1 mmHg  PA Systolic Pressure 37 mmHg  PA Diastolic Pressure 8 mmHg  PA Mean 22 mmHg  PW A Wave 12 mmHg  PW V Wave 5 mmHg  PW Mean 8 mmHg  AO Systolic Pressure 209 mmHg  AO Diastolic Pressure 51 mmHg  AO Mean 96 mmHg  QP/QS 1  TPVR Index 6.97 HRUI  TSVR Index 30.41 HRUI  PVR SVR Ratio 0.15  TPVR/TSVR Ratio 0.23    Cardiac TAVR CT   TECHNIQUE: A non-contrast, gated CT scan was obtained with axial slices of 3 mm through the heart for aortic valve calcium scoring. A 120 kV retrospective, gated, contrast cardiac scan was obtained. Gantry rotation speed was 250 msecs and collimation was 0.6 mm. Nitroglycerin was not given. The 3D data set was reconstructed in 5% intervals of the 0-95% of the R-R cycle. Systolic and diastolic phases were analyzed on a dedicated workstation using MPR, MIP, and VRT modes. The patient received 100 cc of contrast.   FINDINGS: Image quality: Good. There is cardiac motion artifact likely related to irregular rhythm versus ectopic beat.   Noise artifact is: Limited.   Valve Morphology: The aortic valve is tricuspid. There are severe diffuse calcifications. The [place] contains bulky calcification. The leaflets demonstrate severely reduced movement in systole.   Aortic Valve Calcium score: 1457   Aortic annular dimension:   Phase assessed: 35%   Annular area: 289 mm2  Annular perimeter: 61.2 mm   Max diameter: 21.1 mm   Min diameter: 17.8 mm   Annular and subannular calcification: None. There is LVOT calcification that is mild that extends under the [place] to the anterior mitral valve leaflet.   Optimal coplanar projection: LAO 10 CAU 9   Coronary Artery Height above Annulus:   Left Main: 12.8 mm   Right Coronary: 18.3 mm    Sinus of Valsalva Measurements:   Non-coronary: 27 mm   Right-coronary: 26 mm   Left-coronary: 25 mm   Average diameter: 25.8 mm   Sinus of Valsalva Height:   Non-coronary: 18.7 mm   Right-coronary: 20.6 mm   Left-coronary: 18.8 mm   Sinotubular Junction: 26 mm with mild calcification   Ascending Thoracic Aorta: 31 mm   Coronary Arteries: Normal coronary origin. Right dominance. The study was performed without use of NTG and is insufficient for plaque evaluation. Please refer to recent cardiac catheterization for coronary assessment. Mild coronary calcifications noted.   Cardiac Morphology:   Right Atrium: Right atrial size is dilated. Contrast reflux into the IVC consistent with elevated right atrial pressure.   Right Ventricle: The right ventricular cavity is within normal limits.   Left Atrium: Left atrial size is dilated in size with no left atrial appendage filling defect.   Left Ventricle: The ventricular cavity size is within normal limits. There are no stigmata of prior infarction. There is no abnormal filling defect. LV function is hyperdynamic, LVEF=87%. No regional wall motion abnormalities.   Pulmonary arteries: Dilated suggestive of pulmonary hypertension. No proximal filling defect.   Pulmonary veins: Normal pulmonary venous drainage.   Pericardium: Normal thickness with no significant effusion or calcium present.   Mitral Valve: The mitral valve is degenerative with moderate to severe annular calcification.   Extra-cardiac findings: See attached radiology report for non-cardiac structures.   IMPRESSION: 1. Tricuspid aortic valve with severe aortic stenosis (calcium score 1457).   2. Small annular measurements (292 mm2). Sinus measurements and heights support a 23 mm Evolut Pro TAVR.   3. No significant annular calcifications noted. There is mild LVOT calcification under the [place] that extends to the anterior mitral valve.   4.   Sufficient coronary to annulus distance.   5.  Optimal Fluoroscopic Angle for Delivery: LAO 10 CAU 9   6.  Dilated pulmonary artery suggestive of pulmonary hypertension.   7.  Moderate to severe mitral annular calcification noted.   [place] T. Audie Box, MD   Electronically Signed: By: Eleonore Chiquito On: 08/25/2020 20:58    CT ANGIOGRAPHY CHEST, ABDOMEN AND PELVIS   TECHNIQUE: Multidetector CT imaging through the chest, abdomen and pelvis was performed using the standard protocol during bolus administration of intravenous contrast. Multiplanar reconstructed images and MIPs were obtained and reviewed to evaluate the vascular anatomy.   CONTRAST:  64m OMNIPAQUE IOHEXOL 350 MG/ML SOLN   COMPARISON:  CT the chest 04/29/2015.   FINDINGS: CTA CHEST FINDINGS   Cardiovascular: Heart size is normal. There is no significant pericardial fluid, thickening or pericardial calcification. There is aortic atherosclerosis, as well as atherosclerosis of the great vessels of the mediastinum and the coronary arteries, including calcified atherosclerotic plaque in the left anterior descending coronary artery. Thickening calcification of the aortic valve. Severe calcifications of the mitral annulus.   Mediastinum/Lymph Nodes: No pathologically enlarged mediastinal or hilar lymph nodes. Esophagus is unremarkable in appearance. No axillary lymphadenopathy.   Lungs/Pleura: No acute consolidative airspace disease. No pleural effusions. Linear areas of  scarring or subsegmental atelectasis in the lower lobes of the lungs bilaterally. No suspicious appearing pulmonary nodules or masses are noted.   Musculoskeletal/Soft Tissues: There are no aggressive appearing lytic or blastic lesions noted in the visualized portions of the skeleton.   CTA ABDOMEN AND PELVIS FINDINGS   Hepatobiliary: No suspicious cystic or solid hepatic lesions. No intra or extrahepatic biliary ductal dilatation. Numerous  tiny calcified gallstones lying dependently in the gallbladder. No findings to suggest an acute cholecystitis at this time.   Pancreas: No pancreatic mass. No pancreatic ductal dilatation. No pancreatic or peripancreatic fluid collections or inflammatory changes.   Spleen: Unremarkable.   Adrenals/Urinary Tract: 4.2 cm low-attenuation lesion in the posterior aspect of the interpolar region of the left kidney, compatible with a simple cyst. Right kidney and bilateral adrenal glands are normal in appearance. No hydroureteronephrosis. Urinary bladder is nearly decompressed, but otherwise unremarkable in appearance.   Stomach/Bowel: The appearance of the stomach is normal. No pathologic dilatation of small bowel or colon. Numerous colonic diverticulae are noted, particularly in the sigmoid colon, without surrounding inflammatory changes to suggest an acute diverticulitis at this time. The appendix is not confidently identified and may be surgically absent. Regardless, there are no inflammatory changes noted adjacent to the cecum to suggest the presence of an acute appendicitis at this time.   Vascular/Lymphatic: Atherosclerotic calcifications in the abdominal aorta and pelvic vasculature. No aneurysm or dissection noted in the abdominal or pelvic vasculature. No lymphadenopathy noted in the abdomen or pelvis.   Reproductive: Status post hysterectomy. Ovaries are not confidently identified may be surgically absent or atrophic.   Other: Extensive edema in the presacral space and perirectal soft tissues is of uncertain etiology and significance. No significant volume of ascites. No pneumoperitoneum.   Musculoskeletal: There are no aggressive appearing lytic or blastic lesions noted in the visualized portions of the skeleton.   VASCULAR MEASUREMENTS PERTINENT TO TAVR:   AORTA:   Minimal Aortic Diameter-13 x 12 mm   Severity of Aortic Calcification-mild   RIGHT PELVIS:    Right Common Iliac Artery -   Minimal Diameter-7.9 x 7.9 mm   Tortuosity-mild   Calcification-mild   Right External Iliac Artery -   Minimal Diameter-6.6 x 5.9 mm   Tortuosity-mild   Calcification-none   Right Common Femoral Artery -   Minimal Diameter-6.6 x 6.7 mm   Tortuosity-mild   Calcification-none   LEFT PELVIS:   Left Common Iliac Artery -   Minimal Diameter-9.4 x 7.2 mm   Tortuosity-mild to moderate   Calcification-mild   Left External Iliac Artery -   Minimal Diameter-7.0 x 6.5 mm   Tortuosity-mild-to-moderate   Calcification-none   Left Common Femoral Artery -   Minimal Diameter-6.8 x 6.5 mm   Tortuosity-mild   Calcification-mild   Review of the MIP images confirms the above findings.   IMPRESSION: 1. Vascular findings and measurements pertinent to potential TAVR procedure, as detailed above. 2. Severe thickening calcification of the aortic valve, compatible with reported clinical history of severe aortic stenosis. 3. There is also severe calcifications of the mitral annulus. 4. Cholelithiasis without evidence of acute cholecystitis. 5. Colonic diverticulosis without evidence of acute diverticulitis at this time. 6. Extensive edema in the presacral space and perirectal soft tissues is of uncertain etiology and significance, but clinical correlation for signs and symptoms of proctitis is recommended. 7. Additional incidental findings, as above.     Electronically Signed   By: Vinnie Langton M.D.   On:  08/26/2020 13:02      Impression:  Patient has stage D1 severe symptomatic aortic stenosis.  She describes stable symptoms of exertional shortness of breath and fatigue that occur with more strenuous physical exertion, consistent with chronic diastolic congestive heart failure.  She was initially admitted to the hospital following a frank syncopal event that occurred while the patient was physically active carrying laundry up and  down stairs in her house.  Although the cause of her syncope remains somewhat unclear, it is quite possibly related to her aortic stenosis.  I have personally reviewed the patient's recent transthoracic echocardiogram, EKG, diagnostic cardiac catheterization, and CT angiograms.  Echocardiogram reveals severe aortic stenosis.  The aortic valve is trileaflet with severe thickening, calcification, and restricted leaflet mobility involving all 3 leaflets of the aortic valve.  Peak velocity across aortic valve measured 3.8 m/s corresponding to mean transvalvular gradient estimated 33 mmHg and aortic valve area calculated only 0.92 cm by VTI.  The dimensionless index was 0.36 with stroke-volume index 38.  EKG reveals sinus rhythm with no significant AV conduction delay and the patient has not had any documented bradycardia arrhythmias during her hospitalization.  Diagnostic cardiac catheterization revealed mild nonobstructive coronary artery disease.  Cardiac-gated CTA of the heart reveals anatomical characteristics consistent with aortic stenosis suitable for treatment by transcatheter aortic valve replacement without any significant complicating featuresother than the presence of a relatively small sized aortic root.  CTA of the aorta and iliac vessels demonstrate what appears to be adequate pelvic vascular access to facilitate a transfemoral approach.  I agree the patient would benefit from aortic valve replacement.  I would not consider this elderly patient a candidate for conventional surgery because of her advanced age, comorbid medical problems including previous stroke, and acutely debilitated condition in the setting of recent femur fracture.  I favor proceeding with transcatheter aortic valve replacement.   Plan:  The patient and her son were counseled at length regarding treatment alternatives for management of severe symptomatic aortic stenosis. Alternative approaches such as conventional aortic  valve replacement, transcatheter aortic valve replacement, and continued medical therapy without intervention were compared and contrasted at length.  The risks associated with conventional surgical aortic valve replacement were discussed in detail, as were expectations for post-operative convalescence, and why I would be reluctant to consider this patient a candidate for conventional surgery.  Issues specific to transcatheter aortic valve replacement were discussed including questions about long term valve durability, the potential for paravalvular leak, possible increased risk of need for permanent pacemaker placement, and other technical complications related to the procedure itself.  Long-term prognosis with medical therapy was discussed. This discussion was placed in the context of the patient's own specific clinical presentation and past medical history.  All of their questions have been addressed.  The patient desires to proceed with transcatheter aortic valve replacement as soon as practical.  We tentatively plan for surgery on Tuesday, August 30, 2020.  Following the decision to proceed with transcatheter aortic valve replacement we discussed the variety of technical issues and risks associated with the procedure, as well as what type of interventions would be attempted in the event of intraoperative catastrophic complications.  The patient understands that we would not consider emergency median sternotomy or use of cardiopulmonary bypass under any circumstances.  The patient understands and accepts all associated risks including but not limited to risk of death, stroke, myocardial infarction, device migration or embolization, device thrombosis, aortic dissection or other major vascular complication, respiratory  failure, renal failure, complete heart block or bradycardia requiring permanent pacemaker, irregular heart rhythm, or delayed complications related to transcatheter heart valve including but not  limited to risk of valve thrombosis, paravalvular leak, or endocarditis.  All questions have been answered.    I spent in excess of 90 minutes during the conduct of this hospital consultation and >50% of this time involved direct face-to-face encounter for counseling and/or coordination of the patient's care.    Valentina Gu. Roxy Manns, MD 08/27/2020 11:19 AM

## 2020-08-27 NOTE — Progress Notes (Signed)
PROGRESS NOTE    Terri King  EXH:[id number] DOB: Sep 09, 1934 DOA: 08/19/2020 PCP: Tresa Garter, MD   Chief Complain:Syncope,fall  Brief Narrative: Terri King is a 86 y.o. female with medical history significant for HTN, moderate AS (per 03/2019 echo), CAD, hx of MI, CAD, OA who presented after being found on the floor at home by family. She reported she passed out and laid on the floor overnight. X-rays revealed a left lateral condyle fracture of the femur. She was evaluated by orthopedic surgery and recommended for a knee immobilizer, touchdown weightbearing, and to follow-up outpatient with [facility] in approximately 2 weeks. Further work-up was commenced regarding her syncope, falling, lethargy/weakness, and confusion as per family. She was found to be on multiple over sedating medications including temazepam, gabapentin, Seroquel. Medications have been weaned down/off over the past few days.  Repeat echo was also ordered and revealed progression of her aortic stenosis, now graded as severe.  MRI brain also obtained which showed a 5 mm acute cortical infarct within the left occipital lobe and tiny chronic lacunar infarcts within the left cerebellar hemisphere.  She was evaluated by cardiology at [facility] - having been transferred to [facility] for further TAVR evaluation and workup.Plan for TAVR  Assessment & Plan:   Principal Problem:   Syncope Active Problems:   HYPOKALEMIA   Essential hypertension   Severe aortic stenosis   Fracture of femoral condyle, closed (HCC)   Acute CVA (cerebrovascular accident) (HCC)   Severe AS - Progression from 03/2019 echo, now with severe AS; Given syncopal episode prior to admission - R/L heart cath 7/6 -unremarkable for significant CAD per report -Plan for TAVR as per cardiology   Transient altered mental status - resolved; Acute syncopal event prior to admission, no recurrent events - At this point, may be multifactorial in  setting of polypharmacy (temazepam, gabapentin, Seroquel), recent CVA - EEG unremarkable - Continue to wean CNS depressants as tolerated.  So far temazepam from 30 mg qhs down to 15 mg qhs, reduced gaba from 300 mg TID to 100 mg TID, and reduced seroquel from BID to 25 mg qhs - tolerated wean so far without incident   Fracture of the left femoral condyle, closed - Status post orthopedics consultation recommended knee immobilizer and follow-up in the office in 2 weeks. -Continue pain meds and supportive care   Acute CVA - Acute 5 mm left occipital infarct seen on MRI brain; also chronic infarcts in left cerebellum; less likely to be cardioembolic given unilateral distribution - Neurology were following -MRA shows small left carotid web with V4 stenosis - TSH (1.542), A1c (5.2 %), Lipid (LDL 83) -Neurology recommended conservative management for vertebral artery stenosis -Currently on aspirin only.  We will check with neurology for further recommendation on antiplatelet regimen   Essential hypertension - Continue amlodipine and triamterene/HCTZ -Monitor Bp  Debility/deconditioning Patient seen by PT/OT and recommended skilled nursing facility on discharge.  TOC following           DVT prophylaxis:Lovenox Code Status: Full Family Communication: None at bedside Status is: Inpatient  Remains inpatient appropriate because:Inpatient level of care appropriate due to severity of illness  Dispo: The patient is from: Home              Anticipated d/c is to: Home              Patient currently is not medically stable to d/c.   Difficult to place patient No  Consultants: Orthopedics, cardiology  Procedures: None  Antimicrobials:  Anti-infectives (From admission, onward)    Start     Dose/Rate Route Frequency Ordered Stop   08/21/20 1000  valACYclovir (VALTREX) tablet 1,000 mg  Status:  Discontinued        1,000 mg Oral 3 times daily 08/21/20 0756 08/21/20 1105        Subjective: Patient seen and examined at bedside this morning.  Hemodynamically stable.  She was complaining of pain on the right heel.  Denies any other new complaints.Son was at bedside  Objective: Vitals:   08/26/20 1105 08/26/20 1650 08/26/20 2111 08/27/20 0427  BP: (!) 127/52 (!) 122/46 (!) 115/52 (!) 158/56  Pulse: 68 69 66 71  Resp: Temp: 98.7 F (37.1 C) 98.8 F (37.1 C) 98.6 F (37 C) 98.7 F (37.1 C)  TempSrc: Oral Oral Oral Oral  SpO2:   100% 99%  Weight:    68.5 kg  Height:        Intake/Output Summary (Last 24 hours) at 08/27/2020 1026 Last data filed at 08/27/2020 0956 Gross per 24 hour  Intake 713 ml  Output 2500 ml  Net -1787 ml   Filed Weights   08/25/20 0446 08/26/20 0446 08/27/20 0427  Weight: 70.7 kg 70.7 kg 68.5 kg    Examination:  General exam: Overall comfortable, not in distress HEENT: PERRL Respiratory system:  no wheezes or crackles  Cardiovascular system: S1 & S2 heard, RRR.  Gastrointestinal system: Abdomen is nondistended, soft and nontender. Central nervous system: Alert and oriented Extremities: No edema, no clubbing ,no cyanosis, knee immobilizer on the left Skin: No rashes, no ulcers,no icterus      Data Reviewed: I have personally reviewed following labs and imaging studies  CBC: Recent Labs  Lab 08/22/20 0405 08/24/20 0331 08/24/20 1645 08/24/20 1740 08/24/20 1855 08/25/20 0332 08/26/20 0544 08/27/20 0134  WBC 10.0 9.4  --   --  5.8 13.7* 10.2 8.8  NEUTROABS 6.6 5.3  --   --   --  11.3* 6.3 5.0  HGB 11.7* 12.4   < > 12.9  12.9 20.6* 12.6 12.8 12.1  HCT 33.9* 36.6   < > 38.0  38.0 59.8* 36.6 37.6 34.6*  MCV 92.1 92.9  --   --  90.9 92.4 93.3 92.3  PLT 194 228  --   --  139* 253 170 240   < > = values in this interval not displayed.   Basic Metabolic Panel: Recent Labs  Lab 08/22/20 0405 08/24/20 0331 08/24/20 1645 08/24/20 1740 08/24/20 1855 08/25/20 0332 08/26/20 0544 08/27/20 0134  NA  134* 140 136 136  137  --  133* 134* 133*  K 3.1* 4.5 4.2 4.0  3.9  --  4.2 3.9 3.8  CL 102 107  --   --   --  104 104 103  CO2 26 25  --   --   --  20* 21* 25  GLUCOSE 121* 119*  --   --   --  133* 109* 117*  BUN 13 15  --   --   --  CREATININE 0.88 0.86  --   --  0.89 0.80 0.85 0.99  CALCIUM 9.4 9.8  --   --   --  9.9 9.8 9.5  MG 1.8 1.8  --   --   --  1.8 2.0 2.0   GFR: Estimated Creatinine Clearance: 39.3 mL/min (by C-G formula  based on SCr of 0.99 mg/dL). Liver Function Tests: No results for input(s): AST, ALT, ALKPHOS, BILITOT, PROT, ALBUMIN in the last 168 hours. No results for input(s): LIPASE, AMYLASE in the last 168 hours. No results for input(s): AMMONIA in the last 168 hours. Coagulation Profile: No results for input(s): INR, PROTIME in the last 168 hours. Cardiac Enzymes: No results for input(s): CKTOTAL, CKMB, CKMBINDEX, TROPONINI in the last 168 hours. BNP (last 3 results) No results for input(s): PROBNP in the last 8760 hours. HbA1C: No results for input(s): HGBA1C in the last 72 hours. CBG: No results for input(s): GLUCAP in the last 168 hours. Lipid Profile: No results for input(s): CHOL, HDL, LDLCALC, TRIG, CHOLHDL, LDLDIRECT in the last 72 hours. Thyroid Function Tests: No results for input(s): TSH, T4TOTAL, FREET4, T3FREE, THYROIDAB in the last 72 hours. Anemia Panel: No results for input(s): VITAMINB12, FOLATE, FERRITIN, TIBC, IRON, RETICCTPCT in the last 72 hours. Sepsis Labs: No results for input(s): PROCALCITON, LATICACIDVEN in the last 168 hours.  Recent Results (from the past 240 hour(s))  SARS CORONAVIRUS 2 (TAT 6-24 HRS) Nasopharyngeal Nasopharyngeal Swab     Status: None   Collection Time: 08/20/20  6:31 AM   Specimen: Nasopharyngeal Swab  Result Value Ref Range Status   SARS Coronavirus 2 NEGATIVE NEGATIVE Final    Comment: (NOTE) SARS-CoV-2 target nucleic acids are NOT DETECTED.  The SARS-CoV-2 RNA is generally detectable in  upper and lower respiratory specimens during the acute phase of infection. Negative results do not preclude SARS-CoV-2 infection, do not rule out co-infections with other pathogens, and should not be used as the sole basis for treatment or other patient management decisions. Negative results must be combined with clinical observations, patient history, and epidemiological information. The expected result is Negative.  Fact Sheet for Patients: HairSlick.no  Fact Sheet for Healthcare Providers: quierodirigir.com  This test is not yet approved or cleared by the [place] FDA and  has been authorized for detection and/or diagnosis of SARS-CoV-2 by FDA under an Emergency Use Authorization (EUA). This EUA will remain  in effect (meaning this test can be used) for the duration of the COVID-19 declaration under Se ction 564(b)(1) of the Act, 21 U.S.C. section 360bbb-3(b)(1), unless the authorization is terminated or revoked sooner.  Performed at [facility], [address], [place], [place] [address]          Radiology Studies: CT CORONARY MORPH W/CTA COR W/SCORE W/CA W/CM &/OR WO/CM  Addendum Date: 08/26/2020   ADDENDUM REPORT: 08/26/2020 08:10 ADDENDUM: Please see separate dictation for contemporaneously obtained CTA chest, abdomen and pelvis dated 08/26/2020 for full description of relevant extracardiac findings. Electronically Signed   By: Trudie Reed M.D.   On: 08/26/2020 08:10   Result Date: 08/26/2020 CLINICAL DATA:  Severe Aortic Stenosis. EXAM: Cardiac TAVR CT TECHNIQUE: A non-contrast, gated CT scan was obtained with axial slices of 3 mm through the heart for aortic valve calcium scoring. A 120 kV retrospective, gated, contrast cardiac scan was obtained. Gantry rotation speed was 250 msecs and collimation was 0.6 mm. Nitroglycerin was not given. The 3D data set was reconstructed in 5% intervals of the 0-95% of  the R-R cycle. Systolic and diastolic phases were analyzed on a dedicated workstation using MPR, MIP, and VRT modes. The patient received 100 cc of contrast. FINDINGS: Image quality: Good. There is cardiac motion artifact likely related to irregular rhythm versus ectopic beat. Noise artifact is: Limited. Valve Morphology: The aortic valve is  tricuspid. There are severe diffuse calcifications. The NCC contains bulky calcification. The leaflets demonstrate severely reduced movement in systole. Aortic Valve Calcium score: 1457 Aortic annular dimension: Phase assessed: 35% Annular area: 289 mm2 Annular perimeter: 61.2 mm Max diameter: 21.1 mm Min diameter: 17.8 mm Annular and subannular calcification: None. There is LVOT calcification that is mild that extends under the LCC to the anterior mitral valve leaflet. Optimal coplanar projection: LAO 10 CAU 9 Coronary Artery Height above Annulus: Left Main: 12.8 mm Right Coronary: 18.3 mm Sinus of Valsalva Measurements: Non-coronary: 27 mm Right-coronary: 26 mm Left-coronary: 25 mm Average diameter: 25.8 mm Sinus of Valsalva Height: Non-coronary: 18.7 mm Right-coronary: 20.6 mm Left-coronary: 18.8 mm Sinotubular Junction: 26 mm with mild calcification Ascending Thoracic Aorta: 31 mm Coronary Arteries: Normal coronary origin. Right dominance. The study was performed without use of NTG and is insufficient for plaque evaluation. Please refer to recent cardiac catheterization for coronary assessment. Mild coronary calcifications noted. Cardiac Morphology: Right Atrium: Right atrial size is dilated. Contrast reflux into the IVC consistent with elevated right atrial pressure. Right Ventricle: The right ventricular cavity is within normal limits. Left Atrium: Left atrial size is dilated in size with no left atrial appendage filling defect. Left Ventricle: The ventricular cavity size is within normal limits. There are no stigmata of prior infarction. There is no abnormal filling  defect. LV function is hyperdynamic, LVEF=87%. No regional wall motion abnormalities. Pulmonary arteries: Dilated suggestive of pulmonary hypertension. No proximal filling defect. Pulmonary veins: Normal pulmonary venous drainage. Pericardium: Normal thickness with no significant effusion or calcium present. Mitral Valve: The mitral valve is degenerative with moderate to severe annular calcification. Extra-cardiac findings: See attached radiology report for non-cardiac structures. IMPRESSION: 1. Tricuspid aortic valve with severe aortic stenosis (calcium score 1457). 2. Small annular measurements (292 mm2). Sinus measurements and heights support a 23 mm Evolut Pro TAVR. 3. No significant annular calcifications noted. There is mild LVOT calcification under the LCC that extends to the anterior mitral valve. 4.  Sufficient coronary to annulus distance. 5.  Optimal Fluoroscopic Angle for Delivery: LAO 10 CAU 9 6.  Dilated pulmonary artery suggestive of pulmonary hypertension. 7.  Moderate to severe mitral annular calcification noted. Gerri Spore T. Flora Lipps, MD Electronically Signed: By: Lennie Odor On: 08/25/2020 20:58   EEG adult  Result Date: 08/25/2020 Charlsie Quest, MD     08/25/2020 11:40 AM Patient Name: SAMANATHA BRAMMER MRN: [id number] Epilepsy Attending: Charlsie Quest Referring Physician/Provider: Dr. Onalee Hua Date: 08/25/2020 Duration: 22.29 minutes Patient history: 86 y.o. female who presented to the ED for evaluation of syncope.  EEG to evaluate for seizures. Level of alertness: Awake AEDs during EEG study: Gabapentin Technical aspects: This EEG study was done with scalp electrodes positioned according to the 10-20 International system of electrode placement. Electrical activity was acquired at a sampling rate of  and reviewed with a high frequency filter of  and a low frequency filter of . EEG data were recorded continuously and digitally stored. Description: The posterior dominant  rhythm consists of 7.5Hz  activity of moderate voltage (25-35 uV) seen predominantly in posterior head regions, symmetric and reactive to eye opening and eye closing. Hyperventilation and photic stimulation were not performed.   IMPRESSION: This study is within normal limits. No seizures or epileptiform discharges were seen throughout the recording. Priyanka Annabelle Harman   CT ANGIO CHEST AORTA W/CM & OR WO/CM  Result Date: 08/26/2020 CLINICAL DATA:  86 year old female with history of severe aortic  stenosis. Preprocedural study prior to potential transcatheter aortic valve replacement (TAVR) procedure. EXAM: CT ANGIOGRAPHY CHEST, ABDOMEN AND PELVIS TECHNIQUE: Multidetector CT imaging through the chest, abdomen and pelvis was performed using the standard protocol during bolus administration of intravenous contrast. Multiplanar reconstructed images and MIPs were obtained and reviewed to evaluate the vascular anatomy. CONTRAST:  95mL OMNIPAQUE IOHEXOL 350 MG/ML SOLN COMPARISON:  CT the chest 04/29/2015. FINDINGS: CTA CHEST FINDINGS Cardiovascular: Heart size is normal. There is no significant pericardial fluid, thickening or pericardial calcification. There is aortic atherosclerosis, as well as atherosclerosis of the great vessels of the mediastinum and the coronary arteries, including calcified atherosclerotic plaque in the left anterior descending coronary artery. Thickening calcification of the aortic valve. Severe calcifications of the mitral annulus. Mediastinum/Lymph Nodes: No pathologically enlarged mediastinal or hilar lymph nodes. Esophagus is unremarkable in appearance. No axillary lymphadenopathy. Lungs/Pleura: No acute consolidative airspace disease. No pleural effusions. Linear areas of scarring or subsegmental atelectasis in the lower lobes of the lungs bilaterally. No suspicious appearing pulmonary nodules or masses are noted. Musculoskeletal/Soft Tissues: There are no aggressive appearing lytic or blastic  lesions noted in the visualized portions of the skeleton. CTA ABDOMEN AND PELVIS FINDINGS Hepatobiliary: No suspicious cystic or solid hepatic lesions. No intra or extrahepatic biliary ductal dilatation. Numerous tiny calcified gallstones lying dependently in the gallbladder. No findings to suggest an acute cholecystitis at this time. Pancreas: No pancreatic mass. No pancreatic ductal dilatation. No pancreatic or peripancreatic fluid collections or inflammatory changes. Spleen: Unremarkable. Adrenals/Urinary Tract: 4.2 cm low-attenuation lesion in the posterior aspect of the interpolar region of the left kidney, compatible with a simple cyst. Right kidney and bilateral adrenal glands are normal in appearance. No hydroureteronephrosis. Urinary bladder is nearly decompressed, but otherwise unremarkable in appearance. Stomach/Bowel: The appearance of the stomach is normal. No pathologic dilatation of small bowel or colon. Numerous colonic diverticulae are noted, particularly in the sigmoid colon, without surrounding inflammatory changes to suggest an acute diverticulitis at this time. The appendix is not confidently identified and may be surgically absent. Regardless, there are no inflammatory changes noted adjacent to the cecum to suggest the presence of an acute appendicitis at this time. Vascular/Lymphatic: Atherosclerotic calcifications in the abdominal aorta and pelvic vasculature. No aneurysm or dissection noted in the abdominal or pelvic vasculature. No lymphadenopathy noted in the abdomen or pelvis. Reproductive: Status post hysterectomy. Ovaries are not confidently identified may be surgically absent or atrophic. Other: Extensive edema in the presacral space and perirectal soft tissues is of uncertain etiology and significance. No significant volume of ascites. No pneumoperitoneum. Musculoskeletal: There are no aggressive appearing lytic or blastic lesions noted in the visualized portions of the skeleton.  VASCULAR MEASUREMENTS PERTINENT TO TAVR: AORTA: Minimal Aortic Diameter-13 x 12 mm Severity of Aortic Calcification-mild RIGHT PELVIS: Right Common Iliac Artery - Minimal Diameter-7.9 x 7.9 mm Tortuosity-mild Calcification-mild Right External Iliac Artery - Minimal Diameter-6.6 x 5.9 mm Tortuosity-mild Calcification-none Right Common Femoral Artery - Minimal Diameter-6.6 x 6.7 mm Tortuosity-mild Calcification-none LEFT PELVIS: Left Common Iliac Artery - Minimal Diameter-9.4 x 7.2 mm Tortuosity-mild to moderate Calcification-mild Left External Iliac Artery - Minimal Diameter-7.0 x 6.5 mm Tortuosity-mild-to-moderate Calcification-none Left Common Femoral Artery - Minimal Diameter-6.8 x 6.5 mm Tortuosity-mild Calcification-mild Review of the MIP images confirms the above findings. IMPRESSION: 1. Vascular findings and measurements pertinent to potential TAVR procedure, as detailed above. 2. Severe thickening calcification of the aortic valve, compatible with reported clinical history of severe aortic stenosis. 3. There is also  severe calcifications of the mitral annulus. 4. Cholelithiasis without evidence of acute cholecystitis. 5. Colonic diverticulosis without evidence of acute diverticulitis at this time. 6. Extensive edema in the presacral space and perirectal soft tissues is of uncertain etiology and significance, but clinical correlation for signs and symptoms of proctitis is recommended. 7. Additional incidental findings, as above. Electronically Signed   By: Trudie Reed M.D.   On: 08/26/2020 13:02   CT Angio Abd/Pel w/ and/or w/o  Result Date: 08/26/2020 CLINICAL DATA:  86 year old female with history of severe aortic stenosis. Preprocedural study prior to potential transcatheter aortic valve replacement (TAVR) procedure. EXAM: CT ANGIOGRAPHY CHEST, ABDOMEN AND PELVIS TECHNIQUE: Multidetector CT imaging through the chest, abdomen and pelvis was performed using the standard protocol during bolus  administration of intravenous contrast. Multiplanar reconstructed images and MIPs were obtained and reviewed to evaluate the vascular anatomy. CONTRAST:  95mL OMNIPAQUE IOHEXOL 350 MG/ML SOLN COMPARISON:  CT the chest 04/29/2015. FINDINGS: CTA CHEST FINDINGS Cardiovascular: Heart size is normal. There is no significant pericardial fluid, thickening or pericardial calcification. There is aortic atherosclerosis, as well as atherosclerosis of the great vessels of the mediastinum and the coronary arteries, including calcified atherosclerotic plaque in the left anterior descending coronary artery. Thickening calcification of the aortic valve. Severe calcifications of the mitral annulus. Mediastinum/Lymph Nodes: No pathologically enlarged mediastinal or hilar lymph nodes. Esophagus is unremarkable in appearance. No axillary lymphadenopathy. Lungs/Pleura: No acute consolidative airspace disease. No pleural effusions. Linear areas of scarring or subsegmental atelectasis in the lower lobes of the lungs bilaterally. No suspicious appearing pulmonary nodules or masses are noted. Musculoskeletal/Soft Tissues: There are no aggressive appearing lytic or blastic lesions noted in the visualized portions of the skeleton. CTA ABDOMEN AND PELVIS FINDINGS Hepatobiliary: No suspicious cystic or solid hepatic lesions. No intra or extrahepatic biliary ductal dilatation. Numerous tiny calcified gallstones lying dependently in the gallbladder. No findings to suggest an acute cholecystitis at this time. Pancreas: No pancreatic mass. No pancreatic ductal dilatation. No pancreatic or peripancreatic fluid collections or inflammatory changes. Spleen: Unremarkable. Adrenals/Urinary Tract: 4.2 cm low-attenuation lesion in the posterior aspect of the interpolar region of the left kidney, compatible with a simple cyst. Right kidney and bilateral adrenal glands are normal in appearance. No hydroureteronephrosis. Urinary bladder is nearly  decompressed, but otherwise unremarkable in appearance. Stomach/Bowel: The appearance of the stomach is normal. No pathologic dilatation of small bowel or colon. Numerous colonic diverticulae are noted, particularly in the sigmoid colon, without surrounding inflammatory changes to suggest an acute diverticulitis at this time. The appendix is not confidently identified and may be surgically absent. Regardless, there are no inflammatory changes noted adjacent to the cecum to suggest the presence of an acute appendicitis at this time. Vascular/Lymphatic: Atherosclerotic calcifications in the abdominal aorta and pelvic vasculature. No aneurysm or dissection noted in the abdominal or pelvic vasculature. No lymphadenopathy noted in the abdomen or pelvis. Reproductive: Status post hysterectomy. Ovaries are not confidently identified may be surgically absent or atrophic. Other: Extensive edema in the presacral space and perirectal soft tissues is of uncertain etiology and significance. No significant volume of ascites. No pneumoperitoneum. Musculoskeletal: There are no aggressive appearing lytic or blastic lesions noted in the visualized portions of the skeleton. VASCULAR MEASUREMENTS PERTINENT TO TAVR: AORTA: Minimal Aortic Diameter-13 x 12 mm Severity of Aortic Calcification-mild RIGHT PELVIS: Right Common Iliac Artery - Minimal Diameter-7.9 x 7.9 mm Tortuosity-mild Calcification-mild Right External Iliac Artery - Minimal Diameter-6.6 x 5.9 mm Tortuosity-mild Calcification-none Right Common  Femoral Artery - Minimal Diameter-6.6 x 6.7 mm Tortuosity-mild Calcification-none LEFT PELVIS: Left Common Iliac Artery - Minimal Diameter-9.4 x 7.2 mm Tortuosity-mild to moderate Calcification-mild Left External Iliac Artery - Minimal Diameter-7.0 x 6.5 mm Tortuosity-mild-to-moderate Calcification-none Left Common Femoral Artery - Minimal Diameter-6.8 x 6.5 mm Tortuosity-mild Calcification-mild Review of the MIP images confirms the  above findings. IMPRESSION: 1. Vascular findings and measurements pertinent to potential TAVR procedure, as detailed above. 2. Severe thickening calcification of the aortic valve, compatible with reported clinical history of severe aortic stenosis. 3. There is also severe calcifications of the mitral annulus. 4. Cholelithiasis without evidence of acute cholecystitis. 5. Colonic diverticulosis without evidence of acute diverticulitis at this time. 6. Extensive edema in the presacral space and perirectal soft tissues is of uncertain etiology and significance, but clinical correlation for signs and symptoms of proctitis is recommended. 7. Additional incidental findings, as above. Electronically Signed   By: Trudie Reed M.D.   On: 08/26/2020 13:02        Scheduled Meds:  amLODipine  5 mg Oral Daily   aspirin EC  81 mg Oral Daily   enoxaparin (LOVENOX) injection  30 mg Subcutaneous Q24H   escitalopram  10 mg Oral Daily   famotidine  40 mg Oral Daily   gabapentin  100 mg Oral TID   metoprolol succinate  50 mg Oral Daily   QUEtiapine  25 mg Oral QHS   sodium chloride flush  3 mL Intravenous Q12H   sodium chloride flush  3 mL Intravenous Q12H   temazepam  15 mg Oral QHS   triamterene-hydrochlorothiazide  1 tablet Oral Daily   Continuous Infusions:  sodium chloride       LOS: 8 days    Time spent: 25 mins,More than 50% of that time was spent in counseling and/or coordination of care.      Burnadette Pop, MD [facility] P7/10/2020, 10:26 AM

## 2020-08-27 NOTE — Progress Notes (Signed)
Progress Note  Patient Name: NAZARIA RIESEN Date of Encounter: 08/27/2020  [facility] Cardiologist: Dietrich Pates, MD   Subjective   Feels well. Denies angina and dyspnea. Just finished her interview with Dr. Cornelius Moras.  Planning for TAVR on Tuesday.  Inpatient Medications    Scheduled Meds:  amLODipine  5 mg Oral Daily   aspirin EC  81 mg Oral Daily   enoxaparin (LOVENOX) injection  30 mg Subcutaneous Q24H   escitalopram  10 mg Oral Daily   famotidine  40 mg Oral Daily   gabapentin  100 mg Oral TID   metoprolol succinate  50 mg Oral Daily   QUEtiapine  25 mg Oral QHS   sodium chloride flush  3 mL Intravenous Q12H   sodium chloride flush  3 mL Intravenous Q12H   temazepam  15 mg Oral QHS   triamterene-hydrochlorothiazide  1 tablet Oral Daily   Continuous Infusions:  sodium chloride     PRN Meds: sodium chloride, HYDROcodone-acetaminophen, ibuprofen, nitroGLYCERIN, ondansetron (ZOFRAN) IV, senna-docusate, sodium chloride flush   Vital Signs    Vitals:   08/26/20 1105 08/26/20 1650 08/26/20 2111 08/27/20 0427  BP: (!) 127/52 (!) 122/46 (!) 115/52 (!) 158/56  Pulse: 68 69 66 71  Resp: 18 20 15 18   Temp: 98.7 F (37.1 C) 98.8 F (37.1 C) 98.6 F (37 C) 98.7 F (37.1 C)  TempSrc: Oral Oral Oral Oral  SpO2:   100% 99%  Weight:    68.5 kg  Height:        Intake/Output Summary (Last 24 hours) at 08/27/2020 1031 Last data filed at 08/27/2020 0956 Gross per 24 hour  Intake 713 ml  Output 2500 ml  Net -1787 ml   Last 3 Weights 08/27/2020 08/26/2020 08/25/2020  Weight (lbs) 151 lb 0.2 oz 155 lb 13.8 oz 155 lb 13.8 oz  Weight (kg) 68.5 kg 70.7 kg 70.7 kg      Telemetry    NSR - Personally Reviewed  ECG    No new tracing - Personally Reviewed  Physical Exam  Appears quite comfortable lying fully supine in bed GEN: No acute distress.   Neck: No JVD Cardiac: RRR, mid peaking 3/6 aortic ejection murmur radiating to the carotids, no diastolic murmurs, rubs, or gallops.   Respiratory: Clear to auscultation bilaterally. GI: Soft, nontender, non-distended  MS: No edema; No deformity. Neuro:  Nonfocal  Psych: Normal affect   Labs    High Sensitivity Troponin:  No results for input(s): TROPONINIHS in the last 720 hours.    Chemistry Recent Labs  Lab 08/25/20 0332 08/26/20 0544 08/27/20 0134  NA 133* 134* 133*  K 4.2 3.9 3.8  CL 104 104 103  CO2 20* 21* 25  GLUCOSE 133* 109* 117*  BUN 15 14 13   CREATININE 0.80 0.85 0.99  CALCIUM 9.9 9.8 9.5  GFRNONAA >60 >60 56*  ANIONGAP 9 9 5      Hematology Recent Labs  Lab 08/25/20 0332 08/26/20 0544 08/27/20 0134  WBC 13.7* 10.2 8.8  RBC 3.96 4.03 3.75*  HGB 12.6 12.8 12.1  HCT 36.6 37.6 34.6*  MCV 92.4 93.3 92.3  MCH 31.8 31.8 32.3  MCHC 34.4 34.0 35.0  RDW 13.2 13.7 13.4  PLT 253 170 240    BNPNo results for input(s): BNP, PROBNP in the last 168 hours.   DDimer No results for input(s): DDIMER in the last 168 hours.   Radiology    CT CORONARY MORPH W/CTA COR W/SCORE W/CA W/CM &/  OR WO/CM  Addendum Date: 08/26/2020   ADDENDUM REPORT: 08/26/2020 08:10 ADDENDUM: Please see separate dictation for contemporaneously obtained CTA chest, abdomen and pelvis dated 08/26/2020 for full description of relevant extracardiac findings. Electronically Signed   By: Trudie Reed M.D.   On: 08/26/2020 08:10   Result Date: 08/26/2020 CLINICAL DATA:  Severe Aortic Stenosis. EXAM: Cardiac TAVR CT TECHNIQUE: A non-contrast, gated CT scan was obtained with axial slices of 3 mm through the heart for aortic valve calcium scoring. A 120 kV retrospective, gated, contrast cardiac scan was obtained. Gantry rotation speed was 250 msecs and collimation was 0.6 mm. Nitroglycerin was not given. The 3D data set was reconstructed in 5% intervals of the 0-95% of the R-R cycle. Systolic and diastolic phases were analyzed on a dedicated workstation using MPR, MIP, and VRT modes. The patient received 100 cc of contrast. FINDINGS:  Image quality: Good. There is cardiac motion artifact likely related to irregular rhythm versus ectopic beat. Noise artifact is: Limited. Valve Morphology: The aortic valve is tricuspid. There are severe diffuse calcifications. The NCC contains bulky calcification. The leaflets demonstrate severely reduced movement in systole. Aortic Valve Calcium score: 1457 Aortic annular dimension: Phase assessed: 35% Annular area: 289 mm2 Annular perimeter: 61.2 mm Max diameter: 21.1 mm Min diameter: 17.8 mm Annular and subannular calcification: None. There is LVOT calcification that is mild that extends under the LCC to the anterior mitral valve leaflet. Optimal coplanar projection: LAO 10 CAU 9 Coronary Artery Height above Annulus: Left Main: 12.8 mm Right Coronary: 18.3 mm Sinus of Valsalva Measurements: Non-coronary: 27 mm Right-coronary: 26 mm Left-coronary: 25 mm Average diameter: 25.8 mm Sinus of Valsalva Height: Non-coronary: 18.7 mm Right-coronary: 20.6 mm Left-coronary: 18.8 mm Sinotubular Junction: 26 mm with mild calcification Ascending Thoracic Aorta: 31 mm Coronary Arteries: Normal coronary origin. Right dominance. The study was performed without use of NTG and is insufficient for plaque evaluation. Please refer to recent cardiac catheterization for coronary assessment. Mild coronary calcifications noted. Cardiac Morphology: Right Atrium: Right atrial size is dilated. Contrast reflux into the IVC consistent with elevated right atrial pressure. Right Ventricle: The right ventricular cavity is within normal limits. Left Atrium: Left atrial size is dilated in size with no left atrial appendage filling defect. Left Ventricle: The ventricular cavity size is within normal limits. There are no stigmata of prior infarction. There is no abnormal filling defect. LV function is hyperdynamic, LVEF=87%. No regional wall motion abnormalities. Pulmonary arteries: Dilated suggestive of pulmonary hypertension. No proximal filling  defect. Pulmonary veins: Normal pulmonary venous drainage. Pericardium: Normal thickness with no significant effusion or calcium present. Mitral Valve: The mitral valve is degenerative with moderate to severe annular calcification. Extra-cardiac findings: See attached radiology report for non-cardiac structures. IMPRESSION: 1. Tricuspid aortic valve with severe aortic stenosis (calcium score 1457). 2. Small annular measurements (292 mm2). Sinus measurements and heights support a 23 mm Evolut Pro TAVR. 3. No significant annular calcifications noted. There is mild LVOT calcification under the LCC that extends to the anterior mitral valve. 4.  Sufficient coronary to annulus distance. 5.  Optimal Fluoroscopic Angle for Delivery: LAO 10 CAU 9 6.  Dilated pulmonary artery suggestive of pulmonary hypertension. 7.  Moderate to severe mitral annular calcification noted. Gerri Spore T. Flora Lipps, MD Electronically Signed: By: Lennie Odor On: 08/25/2020 20:58   EEG adult  Result Date: 08/25/2020 Charlsie Quest, MD     08/25/2020 11:40 AM Patient Name: KRISTIANNE ALBIN MRN: [id number] Epilepsy Attending: Kristopher Oppenheim  Melynda Ripple Referring Physician/Provider: Dr. Onalee Hua Date: 08/25/2020 Duration: 22.29 minutes Patient history: 86 y.o. female who presented to the ED for evaluation of syncope.  EEG to evaluate for seizures. Level of alertness: Awake AEDs during EEG study: Gabapentin Technical aspects: This EEG study was done with scalp electrodes positioned according to the 10-20 International system of electrode placement. Electrical activity was acquired at a sampling rate of 500Hz  and reviewed with a high frequency filter of 70Hz  and a low frequency filter of 1Hz . EEG data were recorded continuously and digitally stored. Description: The posterior dominant rhythm consists of 7.5Hz  activity of moderate voltage (25-35 uV) seen predominantly in posterior head regions, symmetric and reactive to eye opening and eye closing.  Hyperventilation and photic stimulation were not performed.   IMPRESSION: This study is within normal limits. No seizures or epileptiform discharges were seen throughout the recording. Priyanka   CT ANGIO CHEST AORTA W/CM & OR WO/CM  Result Date: 08/26/2020 CLINICAL DATA:  86 year old female with history of severe aortic stenosis. Preprocedural study prior to potential transcatheter aortic valve replacement (TAVR) procedure. EXAM: CT ANGIOGRAPHY CHEST, ABDOMEN AND PELVIS TECHNIQUE: Multidetector CT imaging through the chest, abdomen and pelvis was performed using the standard protocol during bolus administration of intravenous contrast. Multiplanar reconstructed images and MIPs were obtained and reviewed to evaluate the vascular anatomy. CONTRAST:  25mL OMNIPAQUE IOHEXOL 350 MG/ML SOLN COMPARISON:  CT the chest 04/29/2015. FINDINGS: CTA CHEST FINDINGS Cardiovascular: Heart size is normal. There is no significant pericardial fluid, thickening or pericardial calcification. There is aortic atherosclerosis, as well as atherosclerosis of the great vessels of the mediastinum and the coronary arteries, including calcified atherosclerotic plaque in the left anterior descending coronary artery. Thickening calcification of the aortic valve. Severe calcifications of the mitral annulus. Mediastinum/Lymph Nodes: No pathologically enlarged mediastinal or hilar lymph nodes. Esophagus is unremarkable in appearance. No axillary lymphadenopathy. Lungs/Pleura: No acute consolidative airspace disease. No pleural effusions. Linear areas of scarring or subsegmental atelectasis in the lower lobes of the lungs bilaterally. No suspicious appearing pulmonary nodules or masses are noted. Musculoskeletal/Soft Tissues: There are no aggressive appearing lytic or blastic lesions noted in the visualized portions of the skeleton. CTA ABDOMEN AND PELVIS FINDINGS Hepatobiliary: No suspicious cystic or solid hepatic lesions. No intra or  extrahepatic biliary ductal dilatation. Numerous tiny calcified gallstones lying dependently in the gallbladder. No findings to suggest an acute cholecystitis at this time. Pancreas: No pancreatic mass. No pancreatic ductal dilatation. No pancreatic or peripancreatic fluid collections or inflammatory changes. Spleen: Unremarkable. Adrenals/Urinary Tract: 4.2 cm low-attenuation lesion in the posterior aspect of the interpolar region of the left kidney, compatible with a simple cyst. Right kidney and bilateral adrenal glands are normal in appearance. No hydroureteronephrosis. Urinary bladder is nearly decompressed, but otherwise unremarkable in appearance. Stomach/Bowel: The appearance of the stomach is normal. No pathologic dilatation of small bowel or colon. Numerous colonic diverticulae are noted, particularly in the sigmoid colon, without surrounding inflammatory changes to suggest an acute diverticulitis at this time. The appendix is not confidently identified and may be surgically absent. Regardless, there are no inflammatory changes noted adjacent to the cecum to suggest the presence of an acute appendicitis at this time. Vascular/Lymphatic: Atherosclerotic calcifications in the abdominal aorta and pelvic vasculature. No aneurysm or dissection noted in the abdominal or pelvic vasculature. No lymphadenopathy noted in the abdomen or pelvis. Reproductive: Status post hysterectomy. Ovaries are not confidently identified may be surgically absent or atrophic. Other: Extensive edema in  the presacral space and perirectal soft tissues is of uncertain etiology and significance. No significant volume of ascites. No pneumoperitoneum. Musculoskeletal: There are no aggressive appearing lytic or blastic lesions noted in the visualized portions of the skeleton. VASCULAR MEASUREMENTS PERTINENT TO TAVR: AORTA: Minimal Aortic Diameter-13 x 12 mm Severity of Aortic Calcification-mild RIGHT PELVIS: Right Common Iliac Artery -  Minimal Diameter-7.9 x 7.9 mm Tortuosity-mild Calcification-mild Right External Iliac Artery - Minimal Diameter-6.6 x 5.9 mm Tortuosity-mild Calcification-none Right Common Femoral Artery - Minimal Diameter-6.6 x 6.7 mm Tortuosity-mild Calcification-none LEFT PELVIS: Left Common Iliac Artery - Minimal Diameter-9.4 x 7.2 mm Tortuosity-mild to moderate Calcification-mild Left External Iliac Artery - Minimal Diameter-7.0 x 6.5 mm Tortuosity-mild-to-moderate Calcification-none Left Common Femoral Artery - Minimal Diameter-6.8 x 6.5 mm Tortuosity-mild Calcification-mild Review of the MIP images confirms the above findings. IMPRESSION: 1. Vascular findings and measurements pertinent to potential TAVR procedure, as detailed above. 2. Severe thickening calcification of the aortic valve, compatible with reported clinical history of severe aortic stenosis. 3. There is also severe calcifications of the mitral annulus. 4. Cholelithiasis without evidence of acute cholecystitis. 5. Colonic diverticulosis without evidence of acute diverticulitis at this time. 6. Extensive edema in the presacral space and perirectal soft tissues is of uncertain etiology and significance, but clinical correlation for signs and symptoms of proctitis is recommended. 7. Additional incidental findings, as above. Electronically Signed   By: Trudie Reedaniel  Entrikin M.D.   On: 08/26/2020 13:02   CT Angio Abd/Pel w/ and/or w/o  Result Date: 08/26/2020 CLINICAL DATA:  86 year old female with history of severe aortic stenosis. Preprocedural study prior to potential transcatheter aortic valve replacement (TAVR) procedure. EXAM: CT ANGIOGRAPHY CHEST, ABDOMEN AND PELVIS TECHNIQUE: Multidetector CT imaging through the chest, abdomen and pelvis was performed using the standard protocol during bolus administration of intravenous contrast. Multiplanar reconstructed images and MIPs were obtained and reviewed to evaluate the vascular anatomy. CONTRAST:  95mL OMNIPAQUE  IOHEXOL 350 MG/ML SOLN COMPARISON:  CT the chest 04/29/2015. FINDINGS: CTA CHEST FINDINGS Cardiovascular: Heart size is normal. There is no significant pericardial fluid, thickening or pericardial calcification. There is aortic atherosclerosis, as well as atherosclerosis of the great vessels of the mediastinum and the coronary arteries, including calcified atherosclerotic plaque in the left anterior descending coronary artery. Thickening calcification of the aortic valve. Severe calcifications of the mitral annulus. Mediastinum/Lymph Nodes: No pathologically enlarged mediastinal or hilar lymph nodes. Esophagus is unremarkable in appearance. No axillary lymphadenopathy. Lungs/Pleura: No acute consolidative airspace disease. No pleural effusions. Linear areas of scarring or subsegmental atelectasis in the lower lobes of the lungs bilaterally. No suspicious appearing pulmonary nodules or masses are noted. Musculoskeletal/Soft Tissues: There are no aggressive appearing lytic or blastic lesions noted in the visualized portions of the skeleton. CTA ABDOMEN AND PELVIS FINDINGS Hepatobiliary: No suspicious cystic or solid hepatic lesions. No intra or extrahepatic biliary ductal dilatation. Numerous tiny calcified gallstones lying dependently in the gallbladder. No findings to suggest an acute cholecystitis at this time. Pancreas: No pancreatic mass. No pancreatic ductal dilatation. No pancreatic or peripancreatic fluid collections or inflammatory changes. Spleen: Unremarkable. Adrenals/Urinary Tract: 4.2 cm low-attenuation lesion in the posterior aspect of the interpolar region of the left kidney, compatible with a simple cyst. Right kidney and bilateral adrenal glands are normal in appearance. No hydroureteronephrosis. Urinary bladder is nearly decompressed, but otherwise unremarkable in appearance. Stomach/Bowel: The appearance of the stomach is normal. No pathologic dilatation of small bowel or colon. Numerous colonic  diverticulae are noted, particularly in the  sigmoid colon, without surrounding inflammatory changes to suggest an acute diverticulitis at this time. The appendix is not confidently identified and may be surgically absent. Regardless, there are no inflammatory changes noted adjacent to the cecum to suggest the presence of an acute appendicitis at this time. Vascular/Lymphatic: Atherosclerotic calcifications in the abdominal aorta and pelvic vasculature. No aneurysm or dissection noted in the abdominal or pelvic vasculature. No lymphadenopathy noted in the abdomen or pelvis. Reproductive: Status post hysterectomy. Ovaries are not confidently identified may be surgically absent or atrophic. Other: Extensive edema in the presacral space and perirectal soft tissues is of uncertain etiology and significance. No significant volume of ascites. No pneumoperitoneum. Musculoskeletal: There are no aggressive appearing lytic or blastic lesions noted in the visualized portions of the skeleton. VASCULAR MEASUREMENTS PERTINENT TO TAVR: AORTA: Minimal Aortic Diameter-13 x 12 mm Severity of Aortic Calcification-mild RIGHT PELVIS: Right Common Iliac Artery - Minimal Diameter-7.9 x 7.9 mm Tortuosity-mild Calcification-mild Right External Iliac Artery - Minimal Diameter-6.6 x 5.9 mm Tortuosity-mild Calcification-none Right Common Femoral Artery - Minimal Diameter-6.6 x 6.7 mm Tortuosity-mild Calcification-none LEFT PELVIS: Left Common Iliac Artery - Minimal Diameter-9.4 x 7.2 mm Tortuosity-mild to moderate Calcification-mild Left External Iliac Artery - Minimal Diameter-7.0 x 6.5 mm Tortuosity-mild-to-moderate Calcification-none Left Common Femoral Artery - Minimal Diameter-6.8 x 6.5 mm Tortuosity-mild Calcification-mild Review of the MIP images confirms the above findings. IMPRESSION: 1. Vascular findings and measurements pertinent to potential TAVR procedure, as detailed above. 2. Severe thickening calcification of the aortic valve,  compatible with reported clinical history of severe aortic stenosis. 3. There is also severe calcifications of the mitral annulus. 4. Cholelithiasis without evidence of acute cholecystitis. 5. Colonic diverticulosis without evidence of acute diverticulitis at this time. 6. Extensive edema in the presacral space and perirectal soft tissues is of uncertain etiology and significance, but clinical correlation for signs and symptoms of proctitis is recommended. 7. Additional incidental findings, as above. Electronically Signed   By: Trudie Reed M.D.   On: 08/26/2020 13:02    Cardiac Studies   TAVR CTA 08/26/2020 1. Tricuspid aortic valve with severe aortic stenosis (calcium score 1457).  2. Small annular measurements (292 mm2). Sinus measurements and heights support a 23 mm Evolut Pro TAVR.  3. No significant annular calcifications noted. There is mild LVOT calcification under the LCC that extends to the anterior mitral valve. 4.  Sufficient coronary to annulus distance.  5.  Optimal Fluoroscopic Angle for Delivery: LAO 10 CAU 9  6.  Dilated pulmonary artery suggestive of pulmonary hypertension.  7.  Moderate to severe mitral annular calcification noted.    Cath 08/24/2020 Normal Right Heart Cath Pressures-PAP 37/8 mmHg with a mean of 22 mmHg. Prox RCA lesion is 20% stenosed.-Otherwise minimal if any CAD with very tortuous vessels.   SUMMARY Documented essentially severe aortic stenosis on echocardiogram.  Aortic valve not crossed. Angiographically normal coronary arteries but very tortuous consistent with hypertensive heart disease Severe systemic hypertension with pressures ranging from 170 to 200 mmHg systolic. Normal Right Heart Cath Pressures As Well As Cardiac Output and Index     RECOMMENDATIONS Continue evaluation for syncope, if considered to be related to aortic stenosis, would likely need to wait until leg healed. Need more aggressive blood pressure management. Echo 08/20/2020 1.  The aortic valve is calcified. There is severe calcifcation of the  aortic valve. There is severe thickening of the aortic valve. Aortic valve regurgitation is mild. Severe aortic valve stenosis. AVA 0.9cm2, mean gradient , peak gradient  ,   Vmax 4.85m/s   2. Left ventricular ejection fraction, by estimation, is 65 to 70%. Left  ventricular ejection fraction by 3D volume is 71 %. The left ventricle has normal function. The left ventricle has no regional wall motion  abnormalities. There is moderate asymmetric hypertrophy of the basal-septum. The rest of the LV segments demonstrate mild left ventricular hypertrophy. Left ventricular diastolic parameters are consistent with Grade I diastolic dysfunction (impaired  relaxation). Elevated left atrial pressure. The average left ventricular global longitudinal strain is -20.2  %. The global longitudinal strain is normal.   3. Right ventricular systolic function is normal. The right ventricular  size is normal. There is severely elevated pulmonary artery systolic  pressure. The estimated right ventricular systolic pressure is 86.2 mmHg.   4. The mitral valve is abnormal. There is moderate thickening of the  mitral valve leaflet(s). There is moderate calcification of the mitral  valve leaflet(s). Moderate to severe mitral annular calcification. Trivial mitral valve regurgitation. Mild calcific mitral stenosis with MVA 1.5cm2 by continuity, mean gradient at HR 74bpm.   5. The inferior vena cava is dilated in size with >50% respiratory  variability, suggesting right atrial pressure of 8 mmHg.   Comparison(s): Compared to prior TTE in 03/2019, the aortic stenosis is now severe (previously moderate) and there is severe pulmonary hypertension with PASP .    Patient Profile     86 y.o. female with probable syncope, found to have progression of AS to severe range, small subacute L occipital ischemic stroke, background severe HTN and  HLP.  Assessment & Plan    Severe AS: presentation w syncope. TAVR next Tuesday. CVA: incidental, asymptomatic. Not a reason to delay TAVR per Neuro eval. L lateral femoral condyle fracture: immobilizer and conservative mgmt for next 2 weeks. HTN: good SBP overnight, mild SBP during day, but DBP<60. No additional meds. HLP: fair LDL around 80. Minimal CAD, PAD. May benefit from low dose statin to reach LDL<70.     For questions or updates, please contact [facility] Please consult [email or web address] for contact info under        Signed, Thurmon Fair, MD  08/27/2020, 10:31 AM

## 2020-08-28 DIAGNOSIS — S72415A Nondisplaced unspecified condyle fracture of lower end of left femur, initial encounter for closed fracture: Secondary | ICD-10-CM | POA: Diagnosis not present

## 2020-08-28 DIAGNOSIS — I350 Nonrheumatic aortic (valve) stenosis: Secondary | ICD-10-CM | POA: Diagnosis not present

## 2020-08-28 LAB — BASIC METABOLIC PANEL
Anion gap: 7 (ref 5–15)
BUN: 13 mg/dL (ref 8–23)
CO2: 27 mmol/L (ref 22–32)
Calcium: 9.9 mg/dL (ref 8.9–10.3)
Chloride: 101 mmol/L (ref 98–111)
Creatinine, Ser: 1 mg/dL (ref 0.44–1.00)
GFR, Estimated: 56 mL/min — ABNORMAL LOW (ref >60)
Glucose, Bld: 125 mg/dL — ABNORMAL HIGH (ref 70–99)
Potassium: 3.6 mmol/L (ref 3.5–5.1)
Sodium: 135 mmol/L (ref 135–145)

## 2020-08-28 LAB — CBC WITH DIFFERENTIAL/PLATELET
Abs Immature Granulocytes: 0.21 10*3/uL — ABNORMAL HIGH (ref 0.00–0.07)
Basophils Absolute: 0.1 10*3/uL (ref 0.0–0.1)
Basophils Relative: 1 %
Eosinophils Absolute: 0.5 10*3/uL (ref 0.0–0.5)
Eosinophils Relative: 5 %
HCT: 35.8 % — ABNORMAL LOW (ref 36.0–46.0)
Hemoglobin: 12 g/dL (ref 12.0–15.0)
Immature Granulocytes: 2 %
Lymphocytes Relative: 19 %
Lymphs Abs: 2 10*3/uL (ref 0.7–4.0)
MCH: 31.3 pg (ref 26.0–34.0)
MCHC: 33.5 g/dL (ref 30.0–36.0)
MCV: 93.2 fL (ref 80.0–100.0)
Monocytes Absolute: 1.2 10*3/uL — ABNORMAL HIGH (ref 0.1–1.0)
Monocytes Relative: 12 %
Neutro Abs: 6.3 10*3/uL (ref 1.7–7.7)
Neutrophils Relative %: 61 %
Platelets: 265 10*3/uL (ref 150–400)
RBC: 3.84 MIL/uL — ABNORMAL LOW (ref 3.87–5.11)
RDW: 13.2 % (ref 11.5–15.5)
WBC: 10.2 10*3/uL (ref 4.0–10.5)
nRBC: 0 % (ref 0.0–0.2)

## 2020-08-28 LAB — MAGNESIUM: Magnesium: 2 mg/dL (ref 1.7–2.4)

## 2020-08-28 MED ORDER — MENTHOL 3 MG MT LOZG
1.0000 | LOZENGE | OROMUCOSAL | Status: DC | PRN
  Administered 2020-08-28: 3 mg via ORAL
  Filled 2020-08-28 (×2): qty 9

## 2020-08-28 NOTE — Progress Notes (Signed)
PROGRESS NOTE    Terri King  OYD:[id number] DOB: 17-Sep-1934 DOA: 08/19/2020 PCP: Tresa Garter, MD   Chief Complain:Syncope,fall  Brief Narrative: Terri King is a 86 y.o. female with medical history significant for HTN, moderate AS (per 03/2019 echo), CAD, hx of MI, CAD, OA who presented after being found on the floor at home by family. She reported she passed out and laid on the floor overnight. X-rays revealed a left lateral condyle fracture of the femur. She was evaluated by orthopedic surgery and recommended for a knee immobilizer, touchdown weightbearing, and to follow-up outpatient with [facility] in approximately 2 weeks. Further work-up was commenced regarding her syncope, falling, lethargy/weakness, and confusion as per family. She was found to be on multiple over sedating medications including temazepam, gabapentin, Seroquel. Medications have been weaned down/off over the past few days.  Repeat echo was also ordered and revealed progression of her aortic stenosis, now graded as severe.  MRI brain also obtained which showed a 5 mm acute cortical infarct within the left occipital lobe and tiny chronic lacunar infarcts within the left cerebellar hemisphere. She was evaluated by cardiology at [facility] - having been transferred to [facility] for further evaluation and workup.Plan for TAVR on Tuesday  Assessment & Plan:   Principal Problem:   Syncope Active Problems:   HYPOKALEMIA   Essential hypertension   Severe aortic stenosis   Fracture of femoral condyle, closed (HCC)   Acute CVA (cerebrovascular accident) (HCC)   Severe AS - Progression from 03/2019 echo, now with severe AS; has a syncopal episode prior to admission - R/L heart cath 7/6 -unremarkable for significant CAD per report -Plan for TAVR as per cardiology   Transient altered mental status - resolved; - At this point, may be multifactorial in setting of polypharmacy (temazepam, gabapentin, Seroquel),  recent CVA - EEG unremarkable - Continue to wean CNS depressants as tolerated.  So far temazepam from 30 mg qhs down to 15 mg qhs, reduced gaba from 300 mg TID to 100 mg TID, and reduced seroquel from BID to 25 mg qhs - tolerated wean so far without incident   Fracture of the left femoral condyle, closed - Status post orthopedics consultation recommended knee immobilizer and follow-up in the office in 2 weeks. -Continue pain meds and supportive care   Acute CVA - Found to have acute 5 mm left occipital infarct seen on MRI brain; also chronic infarcts in left cerebellum; less likely to be cardioembolic given unilateral distribution - Neurology were following -MRA shows small left carotid web with V4 stenosis - TSH (1.542), A1c (5.2 %), Lipid (LDL 83) -Neurology recommended conservative management for vertebral artery stenosis -Currently on aspirin only.  We will check with neurology for further recommendation on antiplatelet regimen after TAVR   Essential hypertension - Continue amlodipine and triamterene/HCTZ -Monitor Bp  Debility/deconditioning Patient seen by PT/OT and recommended skilled nursing facility on discharge.  TOC following           DVT prophylaxis:Lovenox Code Status: Full Family Communication: Son at bedside on 08/27/20 Status is: Inpatient  Remains inpatient appropriate because:Inpatient level of care appropriate due to severity of illness  Dispo: The patient is from: Home              Anticipated d/c is to: Home              Patient currently is not medically stable to d/c.   Difficult to place patient No  Consultants: Orthopedics, cardiology  Procedures: None  Antimicrobials:  Anti-infectives (From admission, onward)    Start     Dose/Rate Route Frequency Ordered Stop   08/21/20 1000  valACYclovir (VALTREX) tablet 1,000 mg  Status:  Discontinued        1,000 mg Oral 3 times daily 08/21/20 0756 08/21/20 1105       Subjective: Patient seen  and examined the bedside this morning.  Hemodynamically stable.  Denies any complaints today.  Objective: Vitals:   08/27/20 2006 08/28/20 0450 08/28/20 0902 08/28/20 1128  BP: (!) 138/50 (!) 170/60 (!) 133/46 (!) 139/58  Pulse: 68 64 68 65  Resp: 15 17 18 16   Temp: 98.8 F (37.1 C) 98.4 F (36.9 C)  98.9 F (37.2 C)  TempSrc: Oral Oral  Oral  SpO2: 99% 98%  100%  Weight:  69.2 kg    Height:        Intake/Output Summary (Last 24 hours) at 08/28/2020 1150 Last data filed at 08/28/2020 1130 Gross per 24 hour  Intake 677 ml  Output 1950 ml  Net -1273 ml   Filed Weights   08/26/20 0446 08/27/20 0427 08/28/20 0450  Weight: 70.7 kg 68.5 kg 69.2 kg    Examination: General exam: Overall comfortable, not in distress HEENT: PERRL Respiratory system:  no wheezes or crackles  Cardiovascular system: S1 & S2 heard, RRR.  Gastrointestinal system: Abdomen is nondistended, soft and nontender. Central nervous system: Alert and oriented Extremities: No edema, no clubbing ,no cyanosis Skin: No rashes, no ulcers,no icterus      Data Reviewed: I have personally reviewed following labs and imaging studies  CBC: Recent Labs  Lab 08/24/20 0331 08/24/20 1645 08/24/20 1855 08/25/20 0332 08/26/20 0544 08/27/20 0134 08/28/20 0307  WBC 9.4  --  5.8 13.7* 10.2 8.8 10.2  NEUTROABS 5.3  --   --  11.3* 6.3 5.0 6.3  HGB 12.4   < > 20.6* 12.6 12.8 12.1 12.0  HCT 36.6   < > 59.8* 36.6 37.6 34.6* 35.8*  MCV 92.9  --  90.9 92.4 93.3 92.3 93.2  PLT 228  --  139* 253 170 240 265   < > = values in this interval not displayed.   Basic Metabolic Panel: Recent Labs  Lab 08/24/20 0331 08/24/20 1645 08/24/20 1740 08/24/20 1855 08/25/20 0332 08/26/20 0544 08/27/20 0134 08/28/20 0307  NA 140   < > 136  137  --  133* 134* 133* 135  K 4.5   < > 4.0  3.9  --  4.2 3.9 3.8 3.6  CL 107  --   --   --  104 104 103 101  CO2 25  --   --   --  20* 21* 25 27  GLUCOSE 119*  --   --   --  133* 109* 117*  125*  BUN 15  --   --   --  15 14 13 13   CREATININE 0.86  --   --  0.89 0.80 0.85 0.99 1.00  CALCIUM 9.8  --   --   --  9.9 9.8 9.5 9.9  MG 1.8  --   --   --  1.8 2.0 2.0 2.0   < > = values in this interval not displayed.   GFR: Estimated Creatinine Clearance: 39.1 mL/min (by C-G formula based on SCr of 1 mg/dL). Liver Function Tests: No results for input(s): AST, ALT, ALKPHOS, BILITOT, PROT, ALBUMIN in the last 168 hours. No results  for input(s): LIPASE, AMYLASE in the last 168 hours. No results for input(s): AMMONIA in the last 168 hours. Coagulation Profile: No results for input(s): INR, PROTIME in the last 168 hours. Cardiac Enzymes: No results for input(s): CKTOTAL, CKMB, CKMBINDEX, TROPONINI in the last 168 hours. BNP (last 3 results) No results for input(s): PROBNP in the last 8760 hours. HbA1C: No results for input(s): HGBA1C in the last 72 hours. CBG: No results for input(s): GLUCAP in the last 168 hours. Lipid Profile: No results for input(s): CHOL, HDL, LDLCALC, TRIG, CHOLHDL, LDLDIRECT in the last 72 hours. Thyroid Function Tests: No results for input(s): TSH, T4TOTAL, FREET4, T3FREE, THYROIDAB in the last 72 hours. Anemia Panel: No results for input(s): VITAMINB12, FOLATE, FERRITIN, TIBC, IRON, RETICCTPCT in the last 72 hours. Sepsis Labs: No results for input(s): PROCALCITON, LATICACIDVEN in the last 168 hours.  Recent Results (from the past 240 hour(s))  SARS CORONAVIRUS 2 (TAT 6-24 HRS) Nasopharyngeal Nasopharyngeal Swab     Status: None   Collection Time: 08/20/20  6:31 AM   Specimen: Nasopharyngeal Swab  Result Value Ref Range Status   SARS Coronavirus 2 NEGATIVE NEGATIVE Final    Comment: (NOTE) SARS-CoV-2 target nucleic acids are NOT DETECTED.  The SARS-CoV-2 RNA is generally detectable in upper and lower respiratory specimens during the acute phase of infection. Negative results do not preclude SARS-CoV-2 infection, do not rule out co-infections with  other pathogens, and should not be used as the sole basis for treatment or other patient management decisions. Negative results must be combined with clinical observations, patient history, and epidemiological information. The expected result is Negative.  Fact Sheet for Patients: HairSlick.no  Fact Sheet for Healthcare Providers: quierodirigir.com  This test is not yet approved or cleared by the [place] FDA and  has been authorized for detection and/or diagnosis of SARS-CoV-2 by FDA under an Emergency Use Authorization (EUA). This EUA will remain  in effect (meaning this test can be used) for the duration of the COVID-19 declaration under Se ction 564(b)(1) of the Act, 21 U.S.C. section 360bbb-3(b)(1), unless the authorization is terminated or revoked sooner.  Performed at [facility], [address] [place], [place] [address]          Radiology Studies: No results found.      Scheduled Meds:  amLODipine  5 mg Oral Daily   aspirin EC  81 mg Oral Daily   enoxaparin (LOVENOX) injection  30 mg Subcutaneous Q24H   escitalopram  10 mg Oral Daily   famotidine  40 mg Oral Daily   gabapentin  100 mg Oral TID   metoprolol succinate  50 mg Oral Daily   QUEtiapine  25 mg Oral QHS   sodium chloride flush  3 mL Intravenous Q12H   sodium chloride flush  3 mL Intravenous Q12H   temazepam  15 mg Oral QHS   triamterene-hydrochlorothiazide  1 tablet Oral Daily   Continuous Infusions:  sodium chloride       LOS: 9 days    Time spent: 25 mins,More than 50% of that time was spent in counseling and/or coordination of care.      Burnadette Pop, MD [facility] P7/11/2020, 11:50 AM

## 2020-08-28 NOTE — Progress Notes (Signed)
Son, Luisa Hart, wishes to speak with cardiology re: an update on his mother's status.  Would like a call tomorrow.  Thank you.

## 2020-08-28 NOTE — Progress Notes (Signed)
Progress Note  Patient Name: Terri King Date of Encounter: 08/28/2020  [facility] Cardiologist: Dietrich Pates, MD   Subjective   No dyspnea or angina, lying flat in bed.  Inpatient Medications    Scheduled Meds:  amLODipine  5 mg Oral Daily   aspirin EC  81 mg Oral Daily   enoxaparin (LOVENOX) injection  30 mg Subcutaneous Q24H   escitalopram  10 mg Oral Daily   famotidine  40 mg Oral Daily   gabapentin  100 mg Oral TID   metoprolol succinate  50 mg Oral Daily   QUEtiapine  25 mg Oral QHS   sodium chloride flush  3 mL Intravenous Q12H   sodium chloride flush  3 mL Intravenous Q12H   temazepam  15 mg Oral QHS   triamterene-hydrochlorothiazide  1 tablet Oral Daily   Continuous Infusions:  sodium chloride     PRN Meds: sodium chloride, HYDROcodone-acetaminophen, ibuprofen, morphine injection, nitroGLYCERIN, ondansetron (ZOFRAN) IV, senna-docusate, sodium chloride flush   Vital Signs    Vitals:   08/27/20 1124 08/27/20 2006 08/28/20 0450 08/28/20 0902  BP: (!) 153/50 (!) 138/50 (!) 170/60 (!) 133/46  Pulse: 65 68 64 68  Resp: 18 15 17 18   Temp: 98.5 F (36.9 C) 98.8 F (37.1 C) 98.4 F (36.9 C)   TempSrc: Oral Oral Oral   SpO2:  99% 98%   Weight:   69.2 kg   Height:        Intake/Output Summary (Last 24 hours) at 08/28/2020 0906 Last data filed at 08/28/2020 0831 Gross per 24 hour  Intake 677 ml  Output 2600 ml  Net -1923 ml   Last 3 Weights 08/28/2020 08/27/2020 08/26/2020  Weight (lbs) 152 lb 8.9 oz 151 lb 0.2 oz 155 lb 13.8 oz  Weight (kg) 69.2 kg 68.5 kg 70.7 kg      Telemetry    NSR - Personally Reviewed  ECG    No new tracing - Personally Reviewed  Physical Exam  Appears comfortable GEN: No acute distress.   Neck: No JVD Cardiac: RRR, mid-peaking 3/6 Ao ej murmur, no diastolic murmurs, rubs, or gallops.  Respiratory: Clear to auscultation bilaterally. GI: Soft, nontender, non-distended  MS: No edema; No deformity. Neuro:  Nonfocal   Psych: Normal affect   Labs    High Sensitivity Troponin:  No results for input(s): TROPONINIHS in the last 720 hours.    Chemistry Recent Labs  Lab 08/26/20 0544 08/27/20 0134 08/28/20 0307  NA 134* 133* 135  K 3.9 3.8 3.6  CL 104 103 101  CO2 21* 25 27  GLUCOSE 109* 117* 125*  BUN 14 13 13   CREATININE 0.85 0.99 1.00  CALCIUM 9.8 9.5 9.9  GFRNONAA >60 56* 56*  ANIONGAP 9 5 7      Hematology Recent Labs  Lab 08/26/20 0544 08/27/20 0134 08/28/20 0307  WBC 10.2 8.8 10.2  RBC 4.03 3.75* 3.84*  HGB 12.8 12.1 12.0  HCT 37.6 34.6* 35.8*  MCV 93.3 92.3 93.2  MCH 31.8 32.3 31.3  MCHC 34.0 35.0 33.5  RDW 13.7 13.4 13.2  PLT 170 240 265    BNPNo results for input(s): BNP, PROBNP in the last 168 hours.   DDimer No results for input(s): DDIMER in the last 168 hours.   Radiology    No results found.  Cardiac Studies    TAVR CTA 08/26/2020 1. Tricuspid aortic valve with severe aortic stenosis (calcium score 1457). 2. Small annular measurements (292 mm2). Sinus measurements  and heights support a 23 mm Evolut Pro TAVR. 3. No significant annular calcifications noted. There is mild LVOT calcification under the LCC that extends to the anterior mitral valve. 4.  Sufficient coronary to annulus distance. 5.  Optimal Fluoroscopic Angle for Delivery: LAO 10 CAU 9 6.  Dilated pulmonary artery suggestive of pulmonary hypertension. 7.  Moderate to severe mitral annular calcification noted.     Cath 08/24/2020 Normal Right Heart Cath Pressures-PAP 37/8 mmHg with a mean of 22 mmHg. Prox RCA lesion is 20% stenosed.-Otherwise minimal if any CAD with very tortuous vessels.   SUMMARY Documented essentially severe aortic stenosis on echocardiogram.  Aortic valve not crossed. Angiographically normal coronary arteries but very tortuous consistent with hypertensive heart disease Severe systemic hypertension with pressures ranging from 170 to 200 mmHg systolic. Normal Right Heart Cath  Pressures As Well As Cardiac Output and Index     RECOMMENDATIONS Continue evaluation for syncope, if considered to be related to aortic stenosis, would likely need to wait until leg healed. Need more aggressive blood pressure management. Echo 08/20/2020 1. The aortic valve is calcified. There is severe calcifcation of the  aortic valve. There is severe thickening of the aortic valve. Aortic valve regurgitation is mild. Severe aortic valve stenosis. AVA 0.9cm2, mean gradient , peak gradient ,   Vmax 4.70m/s   2. Left ventricular ejection fraction, by estimation, is 65 to 70%. Left  ventricular ejection fraction by 3D volume is 71 %. The left ventricle has normal function. The left ventricle has no regional wall motion  abnormalities. There is moderate asymmetric hypertrophy of the basal-septum. The rest of the LV segments demonstrate mild left ventricular hypertrophy. Left ventricular diastolic parameters are consistent with Grade I diastolic dysfunction (impaired  relaxation). Elevated left atrial pressure. The average left ventricular global longitudinal strain is -20.2  %. The global longitudinal strain is normal.   3. Right ventricular systolic function is normal. The right ventricular  size is normal. There is severely elevated pulmonary artery systolic  pressure. The estimated right ventricular systolic pressure is 86.2 mmHg.   4. The mitral valve is abnormal. There is moderate thickening of the  mitral valve leaflet(s). There is moderate calcification of the mitral  valve leaflet(s). Moderate to severe mitral annular calcification. Trivial mitral valve regurgitation. Mild calcific mitral stenosis with MVA 1.5cm2 by continuity, mean gradient at HR 74bpm.   5. The inferior vena cava is dilated in size with >50% respiratory  variability, suggesting right atrial pressure of 8 mmHg.   Comparison(s): Compared to prior TTE in 03/2019, the aortic stenosis is now severe  (previously moderate) and there is severe pulmonary hypertension with PASP .  Patient Profile     86 y.o. female with fall, probable syncope, found to have progression of AS to severe range, small subacute L occipital ischemic stroke, background severe HTN and HLP.    Assessment & Plan    Severe AS: presentation w syncope. TAVR scheduled 07/12 at 1200h w Dr. Clifton James. CVA: incidental, asymptomatic. Not a reason to delay TAVR per Neuro eval. L lateral femoral condyle fracture: immobilizer and conservative mgmt for next 2 weeks. HTN: mildly high SBP, but DBP<60. No additional meds, at least not until after TAVR. HLP: fair LDL around 80. Minimal CAD, PAD. May benefit from low dose statin to reach LDL<70.     For questions or updates, please contact [facility] Please consult [email or web address] for contact info under        Signed, [employer]  Ysidro Ramsay, MD  08/28/2020, 9:06 AM

## 2020-08-29 ENCOUNTER — Inpatient Hospital Stay (HOSPITAL_COMMUNITY): Payer: Medicare HMO

## 2020-08-29 DIAGNOSIS — E876 Hypokalemia: Secondary | ICD-10-CM | POA: Diagnosis not present

## 2020-08-29 DIAGNOSIS — I350 Nonrheumatic aortic (valve) stenosis: Secondary | ICD-10-CM | POA: Diagnosis not present

## 2020-08-29 LAB — BLOOD GAS, ARTERIAL
Acid-base deficit: 2.3 mmol/L — ABNORMAL HIGH (ref 0.0–2.0)
Bicarbonate: 20.7 mmol/L (ref 20.0–28.0)
Drawn by: 345601
FIO2: 21
O2 Saturation: 97.3 %
Patient temperature: 36.8
pCO2 arterial: 28 mmHg — ABNORMAL LOW (ref 32.0–48.0)
pH, Arterial: 7.481 — ABNORMAL HIGH (ref 7.350–7.450)
pO2, Arterial: 87.1 mmHg (ref 83.0–108.0)

## 2020-08-29 LAB — PROTIME-INR
INR: 1 (ref 0.8–1.2)
INR: 1.1 (ref 0.8–1.2)
Prothrombin Time: 13.2 seconds (ref 11.4–15.2)
Prothrombin Time: 13.7 seconds (ref 11.4–15.2)

## 2020-08-29 LAB — COMPREHENSIVE METABOLIC PANEL
ALT: 32 U/L (ref 0–44)
AST: 24 U/L (ref 15–41)
Albumin: 3 g/dL — ABNORMAL LOW (ref 3.5–5.0)
Alkaline Phosphatase: 92 U/L (ref 38–126)
Anion gap: 8 (ref 5–15)
BUN: 12 mg/dL (ref 8–23)
CO2: 23 mmol/L (ref 22–32)
Calcium: 9.3 mg/dL (ref 8.9–10.3)
Chloride: 101 mmol/L (ref 98–111)
Creatinine, Ser: 0.86 mg/dL (ref 0.44–1.00)
GFR, Estimated: >60 mL/min (ref >60)
Glucose, Bld: 118 mg/dL — ABNORMAL HIGH (ref 70–99)
Potassium: 3.2 mmol/L — ABNORMAL LOW (ref 3.5–5.1)
Sodium: 132 mmol/L — ABNORMAL LOW (ref 135–145)
Total Bilirubin: 1.1 mg/dL (ref 0.3–1.2)
Total Protein: 6.2 g/dL — ABNORMAL LOW (ref 6.5–8.1)

## 2020-08-29 LAB — URINALYSIS, COMPLETE (UACMP) WITH MICROSCOPIC
Bilirubin Urine: NEGATIVE
Glucose, UA: NEGATIVE mg/dL
Hgb urine dipstick: NEGATIVE
Ketones, ur: NEGATIVE mg/dL
Nitrite: POSITIVE — AB
Protein, ur: NEGATIVE mg/dL
Specific Gravity, Urine: 1.014 (ref 1.005–1.030)
pH: 6 (ref 5.0–8.0)

## 2020-08-29 LAB — TYPE AND SCREEN
ABO/RH(D): A POS
Antibody Screen: NEGATIVE

## 2020-08-29 LAB — CBC
HCT: 36.1 % (ref 36.0–46.0)
Hemoglobin: 12.4 g/dL (ref 12.0–15.0)
MCH: 31.4 pg (ref 26.0–34.0)
MCHC: 34.3 g/dL (ref 30.0–36.0)
MCV: 91.4 fL (ref 80.0–100.0)
Platelets: 277 10*3/uL (ref 150–400)
RBC: 3.95 MIL/uL (ref 3.87–5.11)
RDW: 13.2 % (ref 11.5–15.5)
WBC: 9.8 10*3/uL (ref 4.0–10.5)
nRBC: 0 % (ref 0.0–0.2)

## 2020-08-29 LAB — SURGICAL PCR SCREEN
MRSA, PCR: POSITIVE — AB
Staphylococcus aureus: POSITIVE — AB

## 2020-08-29 LAB — APTT: aPTT: 32 seconds (ref 24–36)

## 2020-08-29 MED ORDER — DEXMEDETOMIDINE HCL IN NACL 400 MCG/100ML IV SOLN
0.1000 ug/kg/h | INTRAVENOUS | Status: DC
  Filled 2020-08-29 (×2): qty 100

## 2020-08-29 MED ORDER — PREDNISONE 50 MG PO TABS
50.0000 mg | ORAL_TABLET | Freq: Four times a day (QID) | ORAL | Status: AC
  Administered 2020-08-29 – 2020-08-30 (×3): 50 mg via ORAL
  Filled 2020-08-29 (×3): qty 1

## 2020-08-29 MED ORDER — BISACODYL 5 MG PO TBEC
5.0000 mg | DELAYED_RELEASE_TABLET | Freq: Once | ORAL | Status: AC
  Administered 2020-08-29: 5 mg via ORAL
  Filled 2020-08-29: qty 1

## 2020-08-29 MED ORDER — POTASSIUM CHLORIDE 2 MEQ/ML IV SOLN
80.0000 meq | INTRAVENOUS | Status: DC
  Filled 2020-08-29: qty 40

## 2020-08-29 MED ORDER — CHLORHEXIDINE GLUCONATE 4 % EX LIQD
1.0000 "application " | Freq: Once | CUTANEOUS | Status: AC
  Filled 2020-08-29: qty 60

## 2020-08-29 MED ORDER — CHLORHEXIDINE GLUCONATE 0.12 % MT SOLN
15.0000 mL | Freq: Once | OROMUCOSAL | Status: AC
  Administered 2020-08-30: 15 mL via OROMUCOSAL
  Filled 2020-08-29: qty 15

## 2020-08-29 MED ORDER — SODIUM CHLORIDE 0.9 % IV SOLN
INTRAVENOUS | Status: DC
  Filled 2020-08-29: qty 30

## 2020-08-29 MED ORDER — MUPIROCIN 2 % EX OINT
1.0000 "application " | TOPICAL_OINTMENT | Freq: Two times a day (BID) | CUTANEOUS | Status: DC
  Administered 2020-08-30 – 2020-09-02 (×7): 1 via NASAL
  Filled 2020-08-29 (×2): qty 22

## 2020-08-29 MED ORDER — CHLORHEXIDINE GLUCONATE CLOTH 2 % EX PADS
6.0000 | MEDICATED_PAD | Freq: Every day | CUTANEOUS | Status: DC
  Administered 2020-09-01: 6 via TOPICAL

## 2020-08-29 MED ORDER — TEMAZEPAM 15 MG PO CAPS: 15.0000 mg | ORAL_CAPSULE | Freq: Once | ORAL | Status: DC | PRN

## 2020-08-29 MED ORDER — CEFAZOLIN SODIUM-DEXTROSE 2-4 GM/100ML-% IV SOLN
2.0000 g | INTRAVENOUS | Status: DC
  Filled 2020-08-29 (×2): qty 100

## 2020-08-29 MED ORDER — POTASSIUM CHLORIDE CRYS ER 20 MEQ PO TBCR
40.0000 meq | EXTENDED_RELEASE_TABLET | Freq: Two times a day (BID) | ORAL | Status: AC
  Administered 2020-08-29 (×2): 40 meq via ORAL
  Filled 2020-08-29 (×2): qty 2

## 2020-08-29 MED ORDER — MAGNESIUM SULFATE 50 % IJ SOLN
40.0000 meq | INTRAMUSCULAR | Status: DC
  Filled 2020-08-29: qty 9.85

## 2020-08-29 MED ORDER — NOREPINEPHRINE 4 MG/250ML-% IV SOLN
0.0000 ug/min | INTRAVENOUS | Status: DC
  Filled 2020-08-29: qty 250

## 2020-08-29 MED ORDER — DIPHENHYDRAMINE HCL 25 MG PO CAPS
50.0000 mg | ORAL_CAPSULE | Freq: Once | ORAL | Status: AC
  Administered 2020-08-30: 50 mg via ORAL
  Filled 2020-08-29: qty 2

## 2020-08-29 NOTE — Progress Notes (Signed)
  [facility]   MULTIDISCIPLINARY HEART VALVE TEAM  Pt scheduled for inpatient TAVR 7/12.  Pre TAVR PT eval deferred at this time due to patient presenting with syncope and Left lateral femoral condyle fracture. Pt currently has an immobilizer on left leg and is non weight bearing.

## 2020-08-29 NOTE — Progress Notes (Signed)
PROGRESS NOTE    Terri King  XQJ:[id number] DOB: 05-Jun-1934 DOA: 08/19/2020 PCP: Tresa Garter, MD   Chief Complain:Syncope,fall  Brief Narrative: Terri King is a 86 y.o. female with medical history significant for HTN, moderate AS (per 03/2019 echo), CAD, hx of MI, CAD, OA who presented after being found on the floor at home by family. She reported she passed out and laid on the floor overnight. X-rays revealed a left lateral condyle fracture of the femur. She was evaluated by orthopedic surgery and recommended for a knee immobilizer, touchdown weightbearing, and to follow-up outpatient with [facility] in approximately 2 weeks. Further work-up was commenced regarding her syncope, falling, lethargy/weakness, and confusion as per family. She was found to be on multiple over sedating medications including temazepam, gabapentin, Seroquel. Medications have been weaned down/off over the past few days.  Repeat echo was also ordered and revealed progression of her aortic stenosis, now graded as severe.  MRI brain also obtained which showed a 5 mm acute cortical infarct within the left occipital lobe and tiny chronic lacunar infarcts within the left cerebellar hemisphere. She was evaluated by cardiology at [facility] - having been transferred to [facility] for further evaluation and workup.Plan for TAVR on Tuesday  Assessment & Plan:   Principal Problem:   Syncope Active Problems:   HYPOKALEMIA   Essential hypertension   Severe aortic stenosis   Fracture of femoral condyle, closed (HCC)   Acute CVA (cerebrovascular accident) (HCC)   Severe AS - Progression from 03/2019 echo, now with severe AS; has a syncopal episode prior to admission - R/L heart cath 7/6 -unremarkable for significant CAD per report -Plan for TAVR as per cardiology   Transient altered mental status - resolved; - At this point, may be multifactorial in setting of polypharmacy (temazepam, gabapentin, Seroquel),  recent CVA - EEG unremarkable - Continue to wean CNS depressants as tolerated.  So far temazepam from 30 mg qhs down to 15 mg qhs, reduced gaba from 300 mg TID to 100 mg TID, and reduced seroquel from BID to 25 mg qhs - tolerated wean so far without incident   Fracture of the left femoral condyle, closed - Status post orthopedics consultation recommended knee immobilizer and follow-up in the office in 2 weeks. -Continue pain meds and supportive care   Acute CVA - Found to have acute 5 mm left occipital infarct seen on MRI brain; also chronic infarcts in left cerebellum; less likely to be cardioembolic given unilateral distribution - Neurology were following -MRA shows small left carotid web with V4 stenosis - TSH (1.542), A1c (5.2 %), Lipid (LDL 83) -Neurology recommended conservative management for vertebral artery stenosis -Currently on aspirin only.  We will check with neurology for further recommendation on antiplatelet regimen after TAVR   Essential hypertension - Continue amlodipine  -Monitor Bp  Hypokalemia: Supplemented with potassium  Debility/deconditioning Patient seen by PT/OT and recommended skilled nursing facility on discharge.  TOC following           DVT prophylaxis:Lovenox Code Status: Full Family Communication: Son at bedside on 08/29/20 Status is: Inpatient  Remains inpatient appropriate because:Inpatient level of care appropriate due to severity of illness  Dispo: The patient is from: Home              Anticipated d/c is to: Home              Patient currently is not medically stable to d/c.   Difficult to place patient No  Consultants: Orthopedics, cardiology  Procedures: None  Antimicrobials:  Anti-infectives (From admission, onward)    Start     Dose/Rate Route Frequency Ordered Stop   08/21/20 1000  valACYclovir (VALTREX) tablet 1,000 mg  Status:  Discontinued        1,000 mg Oral 3 times daily 08/21/20 0756 08/21/20 1105        Subjective:  Patient seen and examined the bedside this morning.  Hemodynamically stable. Denies any complaints today. Objective: Vitals:   08/28/20 1930 08/29/20 0433 08/29/20 1126 08/29/20 1126  BP: (!) 125/43 (!) 155/73    Pulse:  71 69 69  Resp: 18 19    Temp: 99.1 F (37.3 C) 99 F (37.2 C) 98.8 F (37.1 C) 98.8 F (37.1 C)  TempSrc: Oral Oral Oral Oral  SpO2: 100% 99%  99%  Weight:  69.5 kg    Height:        Intake/Output Summary (Last 24 hours) at 08/29/2020 1233 Last data filed at 08/29/2020 1127 Gross per 24 hour  Intake 1200 ml  Output 1900 ml  Net -700 ml   Filed Weights   08/27/20 0427 08/28/20 0450 08/29/20 0433  Weight: 68.5 kg 69.2 kg 69.5 kg    Examination: General exam: Overall comfortable, not in distress HEENT: PERRL Respiratory system:  no wheezes or crackles  Cardiovascular system: S1 & S2 heard, RRR.  Gastrointestinal system: Abdomen is nondistended, soft and nontender. Central nervous system: Alert and oriented Extremities: No edema, no clubbing ,no cyanosis, left knee immobilizer Skin: No rashes, no ulcers,no icterus     Data Reviewed: I have personally reviewed following labs and imaging studies  CBC: Recent Labs  Lab 08/24/20 0331 08/24/20 1645 08/25/20 0332 08/26/20 0544 08/27/20 0134 08/28/20 0307 08/29/20 0428  WBC 9.4   < > 13.7* 10.2 8.8 10.2 9.8  NEUTROABS 5.3  --  11.3* 6.3 5.0 6.3  --   HGB 12.4   < > 12.6 12.8 12.1 12.0 12.4  HCT 36.6   < > 36.6 37.6 34.6* 35.8* 36.1  MCV 92.9   < > 92.4 93.3 92.3 93.2 91.4  PLT 228   < > 253 170 240 265 277   < > = values in this interval not displayed.   Basic Metabolic Panel: Recent Labs  Lab 08/24/20 0331 08/24/20 1645 08/25/20 0332 08/26/20 0544 08/27/20 0134 08/28/20 0307 08/29/20 0428  NA 140   < > 133* 134* 133* 135 132*  K 4.5   < > 4.2 3.9 3.8 3.6 3.2*  CL 107  --  104 104 103 101 101  CO2 25  --  20* 21* 25 27 23   GLUCOSE 119*  --  133* 109* 117* 125* 118*   BUN 15  --  15 14 13 13 12   CREATININE 0.86   < > 0.80 0.85 0.99 1.00 0.86  CALCIUM 9.8  --  9.9 9.8 9.5 9.9 9.3  MG 1.8  --  1.8 2.0 2.0 2.0  --    < > = values in this interval not displayed.   GFR: Estimated Creatinine Clearance: 45.5 mL/min (by C-G formula based on SCr of 0.86 mg/dL). Liver Function Tests: Recent Labs  Lab 08/29/20 0428  AST 24  ALT 32  ALKPHOS 92  BILITOT 1.1  PROT 6.2*  ALBUMIN 3.0*   No results for input(s): LIPASE, AMYLASE in the last 168 hours. No results for input(s): AMMONIA in the last 168 hours. Coagulation Profile: Recent Labs  Lab 08/29/20  0428  INR 1.0   Cardiac Enzymes: No results for input(s): CKTOTAL, CKMB, CKMBINDEX, TROPONINI in the last 168 hours. BNP (last 3 results) No results for input(s): PROBNP in the last 8760 hours. HbA1C: No results for input(s): HGBA1C in the last 72 hours. CBG: No results for input(s): GLUCAP in the last 168 hours. Lipid Profile: No results for input(s): CHOL, HDL, LDLCALC, TRIG, CHOLHDL, LDLDIRECT in the last 72 hours. Thyroid Function Tests: No results for input(s): TSH, T4TOTAL, FREET4, T3FREE, THYROIDAB in the last 72 hours. Anemia Panel: No results for input(s): VITAMINB12, FOLATE, FERRITIN, TIBC, IRON, RETICCTPCT in the last 72 hours. Sepsis Labs: No results for input(s): PROCALCITON, LATICACIDVEN in the last 168 hours.  Recent Results (from the past 240 hour(s))  SARS CORONAVIRUS 2 (TAT 6-24 HRS) Nasopharyngeal Nasopharyngeal Swab     Status: None   Collection Time: 08/20/20  6:31 AM   Specimen: Nasopharyngeal Swab  Result Value Ref Range Status   SARS Coronavirus 2 NEGATIVE NEGATIVE Final    Comment: (NOTE) SARS-CoV-2 target nucleic acids are NOT DETECTED.  The SARS-CoV-2 RNA is generally detectable in upper and lower respiratory specimens during the acute phase of infection. Negative results do not preclude SARS-CoV-2 infection, do not rule out co-infections with other pathogens, and  should not be used as the sole basis for treatment or other patient management decisions. Negative results must be combined with clinical observations, patient history, and epidemiological information. The expected result is Negative.  Fact Sheet for Patients: HairSlick.no  Fact Sheet for Healthcare Providers: quierodirigir.com  This test is not yet approved or cleared by the [place] FDA and  has been authorized for detection and/or diagnosis of SARS-CoV-2 by FDA under an Emergency Use Authorization (EUA). This EUA will remain  in effect (meaning this test can be used) for the duration of the COVID-19 declaration under Se ction 564(b)(1) of the Act, 21 U.S.C. section 360bbb-3(b)(1), unless the authorization is terminated or revoked sooner.  Performed at [facility], [address] [place], [place] [address]          Radiology Studies: No results found.      Scheduled Meds:  amLODipine  5 mg Oral Daily   aspirin EC  81 mg Oral Daily   [START ON 08/30/2020] diphenhydrAMINE  50 mg Oral Once   enoxaparin (LOVENOX) injection  30 mg Subcutaneous Q24H   escitalopram  10 mg Oral Daily   famotidine  40 mg Oral Daily   gabapentin  100 mg Oral TID   metoprolol succinate  50 mg Oral Daily   potassium chloride  40 mEq Oral BID WC   predniSONE  50 mg Oral Q6H   QUEtiapine  25 mg Oral QHS   sodium chloride flush  3 mL Intravenous Q12H   sodium chloride flush  3 mL Intravenous Q12H   temazepam  15 mg Oral QHS   Continuous Infusions:  sodium chloride       LOS: 10 days    Time spent: 25 mins,More than 50% of that time was spent in counseling and/or coordination of care.      Burnadette Pop, MD [facility] P7/12/2020, 12:33 PM

## 2020-08-29 NOTE — Care Management Important Message (Signed)
Important Message  Patient Details  Name: Terri King MRN: [id number] Date of Birth: [birth year]   Medicare Important Message Given:  Yes     Renie Ora 08/29/2020, 9:45 AM

## 2020-08-29 NOTE — Progress Notes (Signed)
Progress Note  Patient Name: Terri King Date of Encounter: 08/29/2020  [facility] Cardiologist: Dietrich Pates, MD   Subjective   No complaints. Some hyponatremia today- potassium 3.2. Plan for TAVR tomorrow.  Inpatient Medications    Scheduled Meds:  amLODipine  5 mg Oral Daily   aspirin EC  81 mg Oral Daily   enoxaparin (LOVENOX) injection  30 mg Subcutaneous Q24H   escitalopram  10 mg Oral Daily   famotidine  40 mg Oral Daily   gabapentin  100 mg Oral TID   metoprolol succinate  50 mg Oral Daily   potassium chloride  40 mEq Oral BID WC   QUEtiapine  25 mg Oral QHS   sodium chloride flush  3 mL Intravenous Q12H   sodium chloride flush  3 mL Intravenous Q12H   temazepam  15 mg Oral QHS   triamterene-hydrochlorothiazide  1 tablet Oral Daily   Continuous Infusions:  sodium chloride     PRN Meds: sodium chloride, HYDROcodone-acetaminophen, ibuprofen, menthol-cetylpyridinium, morphine injection, nitroGLYCERIN, ondansetron (ZOFRAN) IV, senna-docusate, sodium chloride flush   Vital Signs    Vitals:   08/28/20 0902 08/28/20 1128 08/28/20 1930 08/29/20 0433  BP: (!) 133/46 (!) 139/58 (!) 125/43 (!) 155/73  Pulse: 68 65  71  Resp: 18 16 18 19   Temp:  98.9 F (37.2 C) 99.1 F (37.3 C) 99 F (37.2 C)  TempSrc:  Oral Oral Oral  SpO2:  100% 100% 99%  Weight:    69.5 kg  Height:        Intake/Output Summary (Last 24 hours) at 08/29/2020 1035 Last data filed at 08/29/2020 0815 Gross per 24 hour  Intake 1200 ml  Output 1850 ml  Net -650 ml   Last 3 Weights 08/29/2020 08/28/2020 08/27/2020  Weight (lbs) 153 lb 3.5 oz 152 lb 8.9 oz 151 lb 0.2 oz  Weight (kg) 69.5 kg 69.2 kg 68.5 kg      Telemetry    NSR - Personally Reviewed  ECG    No new tracing - Personally Reviewed  Physical Exam   GEN: No acute distress.   Neck: No JVD Cardiac: RRR, late-peaking 3/6 SEM at RUSB, no diastolic murmurs, rubs, or gallops.  Respiratory: Clear to auscultation  bilaterally. GI: Soft, nontender, non-distended  MS: No edema; No deformity. Neuro:  Nonfocal  Psych: Normal affect   Labs    High Sensitivity Troponin:  No results for input(s): TROPONINIHS in the last 720 hours.    Chemistry Recent Labs  Lab 08/27/20 0134 08/28/20 0307 08/29/20 0428  NA 133* 135 132*  K 3.8 3.6 3.2*  CL 103 101 101  CO2 25 27 23   GLUCOSE 117* 125* 118*  BUN 13 13 12   CREATININE 0.99 1.00 0.86  CALCIUM 9.5 9.9 9.3  PROT  --   --  6.2*  ALBUMIN  --   --  3.0*  AST  --   --  24  ALT  --   --  32  ALKPHOS  --   --  92  BILITOT  --   --  1.1  GFRNONAA 56* 56* >60  ANIONGAP 5 7 8      Hematology Recent Labs  Lab 08/27/20 0134 08/28/20 0307 08/29/20 0428  WBC 8.8 10.2 9.8  RBC 3.75* 3.84* 3.95  HGB 12.1 12.0 12.4  HCT 34.6* 35.8* 36.1  MCV 92.3 93.2 91.4  MCH 32.3 31.3 31.4  MCHC 35.0 33.5 34.3  RDW 13.4 13.2 13.2  PLT  240 265 277    BNPNo results for input(s): BNP, PROBNP in the last 168 hours.   DDimer No results for input(s): DDIMER in the last 168 hours.   Radiology    No results found.  Cardiac Studies    TAVR CTA 08/26/2020 1. Tricuspid aortic valve with severe aortic stenosis (calcium score 1457). 2. Small annular measurements (292 mm2). Sinus measurements and heights support a 23 mm Evolut Pro TAVR. 3. No significant annular calcifications noted. There is mild LVOT calcification under the LCC that extends to the anterior mitral valve. 4.  Sufficient coronary to annulus distance. 5.  Optimal Fluoroscopic Angle for Delivery: LAO 10 CAU 9 6.  Dilated pulmonary artery suggestive of pulmonary hypertension. 7.  Moderate to severe mitral annular calcification noted.     Cath 08/24/2020 Normal Right Heart Cath Pressures-PAP 37/8 mmHg with a mean of 22 mmHg. Prox RCA lesion is 20% stenosed.-Otherwise minimal if any CAD with very tortuous vessels.   SUMMARY Documented essentially severe aortic stenosis on echocardiogram.  Aortic valve  not crossed. Angiographically normal coronary arteries but very tortuous consistent with hypertensive heart disease Severe systemic hypertension with pressures ranging from 170 to 200 mmHg systolic. Normal Right Heart Cath Pressures As Well As Cardiac Output and Index     RECOMMENDATIONS Continue evaluation for syncope, if considered to be related to aortic stenosis, would likely need to wait until leg healed. Need more aggressive blood pressure management. Echo 08/20/2020 1. The aortic valve is calcified. There is severe calcifcation of the  aortic valve. There is severe thickening of the aortic valve. Aortic valve regurgitation is mild. Severe aortic valve stenosis. AVA 0.9cm2, mean gradient , peak gradient ,   Vmax 4.60m/s   2. Left ventricular ejection fraction, by estimation, is 65 to 70%. Left  ventricular ejection fraction by 3D volume is 71 %. The left ventricle has normal function. The left ventricle has no regional wall motion  abnormalities. There is moderate asymmetric hypertrophy of the basal-septum. The rest of the LV segments demonstrate mild left ventricular hypertrophy. Left ventricular diastolic parameters are consistent with Grade I diastolic dysfunction (impaired  relaxation). Elevated left atrial pressure. The average left ventricular global longitudinal strain is -20.2  %. The global longitudinal strain is normal.   3. Right ventricular systolic function is normal. The right ventricular  size is normal. There is severely elevated pulmonary artery systolic  pressure. The estimated right ventricular systolic pressure is 86.2 mmHg.   4. The mitral valve is abnormal. There is moderate thickening of the  mitral valve leaflet(s). There is moderate calcification of the mitral  valve leaflet(s). Moderate to severe mitral annular calcification. Trivial mitral valve regurgitation. Mild calcific mitral stenosis with MVA 1.5cm2 by continuity, mean gradient at HR 74bpm.    5. The inferior vena cava is dilated in size with >50% respiratory  variability, suggesting right atrial pressure of 8 mmHg.   Comparison(s): Compared to prior TTE in 03/2019, the aortic stenosis is now severe (previously moderate) and there is severe pulmonary hypertension with PASP .  Patient Profile     86 y.o. female with fall, probable syncope, found to have progression of AS to severe range, small subacute L occipital ischemic stroke, background severe HTN and HLP.   Assessment & Plan    Severe AS: presentation w syncope. TAVR scheduled 07/12 at 1200h w Dr. Clifton James. CVA: incidental, asymptomatic. Not a reason to delay TAVR per Neuro eval. L lateral femoral condyle fracture: immobilizer and  conservative mgmt for next 2 weeks. HTN: mildly high SBP, but DBP<60. No additional meds, at least not until after TAVR. HLP: fair LDL around 80. Minimal CAD, PAD. May benefit from low dose statin to reach LDL<70. 6.   Hypokalemia: on BID potassium - K downtrending - she is on maxzide (home med), however, seems to be significantly net negative (about 4L) - will hold diuretic, resume PRN after TAVR.  For questions or updates, please contact [facility] Please consult [email or web address] for contact info under   Chrystie Nose, MD, Milagros Loll  [place]  [facility]  Medical Director of the Advanced Lipid Disorders &  Cardiovascular Risk Reduction Clinic Diplomate of the American Board of Clinical Lipidology Attending Cardiologist  Direct Dial: [phone]  Fax: [phone]  Website:  [email or web address]  Chrystie Nose, MD  08/29/2020, 10:35 AM

## 2020-08-29 NOTE — TOC Progression Note (Signed)
Transition of Care [facility]) - Progression Note    Patient Details  Name: Terri King MRN: [id number] Date of Birth: 10-May-1934  [facility]) CM/SW Contact  Ivette Loyal, [place] Phone Number: 08/29/2020, 2:05 PM  Clinical Narrative:    1330: Tresa Endo at [place] contacted CSW for DC status. CSW shared that pt will not DC today, Tresa Endo will start auth again bc auth expired as of today. CSW will continue to follow for DC needs.        Expected Discharge Plan and Services                                                 Social Determinants of Health (SDOH) Interventions    Readmission Risk Interventions No flowsheet data found.

## 2020-08-29 NOTE — Consult Note (Signed)
   [facility] CM Inpatient Consult   08/29/2020  RYDER CHESMORE 08-Sep-1934 [id number]  [facility] [[facility]]  Accountable Care Organization [ACO] Patient: [facility] Medicare  Patient screened for length of stay hospitalization with noted and to assess for potential [facility]  [[facility]] Care Management service needs for post hospital transition. Patient discussed in progression meeting for ongoing medical treatment planned. Review of patient's medical record reveals patient is from [place].  Plan:  Continue to follow progress and disposition to assess for post hospital care management needs.  No current [facility] post hospital follow up planned at this time.  For questions contact:   Charlesetta Shanks, RN BSN CCM [facility]  [phone] business mobile phone Toll free office [phone]  Fax number: [phone] [email or web address]

## 2020-08-30 ENCOUNTER — Other Ambulatory Visit (HOSPITAL_COMMUNITY): Payer: Medicare HMO

## 2020-08-30 ENCOUNTER — Other Ambulatory Visit: Payer: Self-pay

## 2020-08-30 ENCOUNTER — Inpatient Hospital Stay (HOSPITAL_COMMUNITY): Payer: Medicare HMO

## 2020-08-30 ENCOUNTER — Inpatient Hospital Stay (HOSPITAL_COMMUNITY): Admission: RE | Admit: 2020-08-30 | Payer: Medicare HMO | Source: Home / Self Care | Admitting: Cardiovascular Disease

## 2020-08-30 ENCOUNTER — Encounter (HOSPITAL_COMMUNITY)
Admission: EM | Disposition: A | Discharge status: Skilled Nursing Facility | Payer: Self-pay | Source: Home / Self Care | Attending: Internal Medicine

## 2020-08-30 ENCOUNTER — Encounter (HOSPITAL_COMMUNITY): Payer: Self-pay | Admitting: Family Medicine

## 2020-08-30 DIAGNOSIS — I350 Nonrheumatic aortic (valve) stenosis: Secondary | ICD-10-CM

## 2020-08-30 DIAGNOSIS — Z952 Presence of prosthetic heart valve: Secondary | ICD-10-CM

## 2020-08-30 DIAGNOSIS — Z006 Encounter for examination for normal comparison and control in clinical research program: Secondary | ICD-10-CM

## 2020-08-30 HISTORY — PX: TEE WITHOUT CARDIOVERSION: SHX5443

## 2020-08-30 HISTORY — PX: TRANSCATHETER AORTIC VALVE REPLACEMENT, TRANSFEMORAL: SHX6400

## 2020-08-30 LAB — POCT I-STAT, CHEM 8
BUN: 17 mg/dL (ref 8–23)
Calcium, Ion: 1.34 mmol/L (ref 1.15–1.40)
Chloride: 106 mmol/L (ref 98–111)
Creatinine, Ser: 0.8 mg/dL (ref 0.44–1.00)
Glucose, Bld: 159 mg/dL — ABNORMAL HIGH (ref 70–99)
HCT: 39 % (ref 36.0–46.0)
Hemoglobin: 13.3 g/dL (ref 12.0–15.0)
Potassium: 3.8 mmol/L (ref 3.5–5.1)
Sodium: 136 mmol/L (ref 135–145)
TCO2: 20 mmol/L — ABNORMAL LOW (ref 22–32)

## 2020-08-30 LAB — ECHOCARDIOGRAM LIMITED
AR max vel: 1.03 cm2
AV Area VTI: 1.09 cm2
AV Area mean vel: 1.06 cm2
AV Mean grad: 19 mmHg
AV Peak grad: 27.2 mmHg
Ao pk vel: 2.61 m/s
Height: 63 in
Weight: 2433.88 oz

## 2020-08-30 LAB — BASIC METABOLIC PANEL
Anion gap: 7 (ref 5–15)
BUN: 15 mg/dL (ref 8–23)
CO2: 22 mmol/L (ref 22–32)
Calcium: 9.9 mg/dL (ref 8.9–10.3)
Chloride: 104 mmol/L (ref 98–111)
Creatinine, Ser: 0.95 mg/dL (ref 0.44–1.00)
GFR, Estimated: 59 mL/min — ABNORMAL LOW (ref >60)
Glucose, Bld: 140 mg/dL — ABNORMAL HIGH (ref 70–99)
Potassium: 3.8 mmol/L (ref 3.5–5.1)
Sodium: 133 mmol/L — ABNORMAL LOW (ref 135–145)

## 2020-08-30 LAB — CBC
HCT: 36.3 % (ref 36.0–46.0)
Hemoglobin: 12.7 g/dL (ref 12.0–15.0)
MCH: 32 pg (ref 26.0–34.0)
MCHC: 35 g/dL (ref 30.0–36.0)
MCV: 91.4 fL (ref 80.0–100.0)
Platelets: 289 10*3/uL (ref 150–400)
RBC: 3.97 MIL/uL (ref 3.87–5.11)
RDW: 13.2 % (ref 11.5–15.5)
WBC: 14 10*3/uL — ABNORMAL HIGH (ref 4.0–10.5)
nRBC: 0 % (ref 0.0–0.2)

## 2020-08-30 LAB — HEMOGLOBIN A1C
Hgb A1c MFr Bld: 5.4 % (ref 4.8–5.6)
Mean Plasma Glucose: 108.28 mg/dL

## 2020-08-30 LAB — POCT ACTIVATED CLOTTING TIME
Activated Clotting Time: 115 seconds
Activated Clotting Time: 248 seconds
Activated Clotting Time: 256 seconds
Activated Clotting Time: 110 seconds

## 2020-08-30 SURGERY — IMPLANTATION, AORTIC VALVE, TRANSCATHETER, FEMORAL APPROACH
Anesthesia: Monitor Anesthesia Care

## 2020-08-30 MED ORDER — HEPARIN SODIUM (PORCINE) 1000 UNIT/ML IJ SOLN
INTRAMUSCULAR | Status: DC | PRN
  Administered 2020-08-30: 10000 [IU] via INTRAVENOUS

## 2020-08-30 MED ORDER — SODIUM CHLORIDE 0.9 % IV SOLN: 250.0000 mL | INTRAVENOUS | Status: DC

## 2020-08-30 MED ORDER — SODIUM CHLORIDE 0.9% FLUSH: 3.0000 mL | INTRAVENOUS | Status: DC | PRN

## 2020-08-30 MED ORDER — SODIUM CHLORIDE 0.9 % IV SOLN: INTRAVENOUS | Status: AC

## 2020-08-30 MED ORDER — PROPOFOL 500 MG/50ML IV EMUL
INTRAVENOUS | Status: DC | PRN
  Administered 2020-08-30: 20 ug/kg/min via INTRAVENOUS

## 2020-08-30 MED ORDER — CHLORHEXIDINE GLUCONATE 0.12 % MT SOLN
15.0000 mL | Freq: Once | OROMUCOSAL | Status: AC
  Administered 2020-08-30: 15 mL via OROMUCOSAL
  Filled 2020-08-30: qty 15

## 2020-08-30 MED ORDER — LACTATED RINGERS IV SOLN: INTRAVENOUS | Status: DC | PRN

## 2020-08-30 MED ORDER — CHLORHEXIDINE GLUCONATE 4 % EX LIQD
CUTANEOUS | Status: AC
  Filled 2020-08-30: qty 15

## 2020-08-30 MED ORDER — ESMOLOL HCL 100 MG/10ML IV SOLN
INTRAVENOUS | Status: DC | PRN
  Administered 2020-08-30: 10 mg via INTRAVENOUS

## 2020-08-30 MED ORDER — CEFAZOLIN SODIUM-DEXTROSE 2-4 GM/100ML-% IV SOLN
2.0000 g | Freq: Three times a day (TID) | INTRAVENOUS | Status: AC
  Administered 2020-08-30 – 2020-08-31 (×2): 2 g via INTRAVENOUS
  Filled 2020-08-30 (×2): qty 100

## 2020-08-30 MED ORDER — NOREPINEPHRINE 4 MG/250ML-% IV SOLN
INTRAVENOUS | Status: DC | PRN
  Administered 2020-08-30: 2 ug/min via INTRAVENOUS

## 2020-08-30 MED ORDER — METOPROLOL TARTRATE 5 MG/5ML IV SOLN: 2.5000 mg | INTRAVENOUS | Status: DC | PRN

## 2020-08-30 MED ORDER — CIPROFLOXACIN HCL 500 MG PO TABS
500.0000 mg | ORAL_TABLET | ORAL | Status: AC
  Administered 2020-08-30: 500 mg via ORAL
  Filled 2020-08-30: qty 1

## 2020-08-30 MED ORDER — LIDOCAINE HCL (PF) 1 % IJ SOLN
INTRAMUSCULAR | Status: DC | PRN
  Administered 2020-08-30 (×2): 10 mL

## 2020-08-30 MED ORDER — PROTAMINE SULFATE 10 MG/ML IV SOLN
INTRAVENOUS | Status: DC | PRN
  Administered 2020-08-30: 100 mg via INTRAVENOUS

## 2020-08-30 MED ORDER — ONDANSETRON HCL 4 MG/2ML IJ SOLN: 4.0000 mg | Freq: Four times a day (QID) | INTRAMUSCULAR | Status: DC | PRN

## 2020-08-30 MED ORDER — SODIUM CHLORIDE 0.9 % IV SOLN: 250.0000 mL | INTRAVENOUS | Status: DC | PRN

## 2020-08-30 MED ORDER — DEXMEDETOMIDINE HCL IN NACL 400 MCG/100ML IV SOLN
INTRAVENOUS | Status: DC | PRN
  Administered 2020-08-30: 1 ug/kg/h via INTRAVENOUS

## 2020-08-30 MED ORDER — IOHEXOL 350 MG/ML SOLN
INTRAVENOUS | Status: DC | PRN
  Administered 2020-08-30: 50 mL

## 2020-08-30 MED ORDER — FENTANYL CITRATE (PF) 100 MCG/2ML IJ SOLN
INTRAMUSCULAR | Status: DC | PRN
  Administered 2020-08-30 (×4): 25 ug via INTRAVENOUS

## 2020-08-30 MED ORDER — PHENYLEPHRINE HCL-NACL 10-0.9 MG/250ML-% IV SOLN
0.0000 ug/min | INTRAVENOUS | Status: DC
  Filled 2020-08-30: qty 250

## 2020-08-30 MED ORDER — NITROGLYCERIN IN D5W 200-5 MCG/ML-% IV SOLN: 0.0000 ug/min | INTRAVENOUS | Status: DC

## 2020-08-30 MED ORDER — VANCOMYCIN HCL IN DEXTROSE 1-5 GM/200ML-% IV SOLN
1000.0000 mg | Freq: Once | INTRAVENOUS | Status: AC
  Administered 2020-08-30: 1000 mg via INTRAVENOUS
  Filled 2020-08-30 (×3): qty 200

## 2020-08-30 MED ORDER — SODIUM CHLORIDE 0.9% FLUSH
3.0000 mL | Freq: Two times a day (BID) | INTRAVENOUS | Status: DC
  Administered 2020-08-31 – 2020-09-02 (×4): 3 mL via INTRAVENOUS

## 2020-08-30 MED ORDER — CLOPIDOGREL BISULFATE 75 MG PO TABS
75.0000 mg | ORAL_TABLET | Freq: Every day | ORAL | Status: DC
  Administered 2020-08-31 – 2020-09-02 (×3): 75 mg via ORAL
  Filled 2020-08-30 (×3): qty 1

## 2020-08-30 MED ORDER — ALBUMIN HUMAN 5 % IV SOLN: INTRAVENOUS | Status: DC | PRN

## 2020-08-30 MED ORDER — HEPARIN (PORCINE) IN NACL 1000-0.9 UT/500ML-% IV SOLN
INTRAVENOUS | Status: DC | PRN
  Administered 2020-08-30 (×2): 500 mL

## 2020-08-30 SURGICAL SUPPLY — 33 items
BAG SNAP BAND KOVER 36X36 (MISCELLANEOUS) ×4 IMPLANT
BLANKET WARM UNDERBOD FULL ACC (MISCELLANEOUS) ×2 IMPLANT
CABLE ADAPT PACING TEMP 12FT (ADAPTER) ×1 IMPLANT
CATH DIAG 6FR PIGTAIL (CATHETERS) ×1 IMPLANT
CATH DIAG 6FR PIGTAIL ANGLED (CATHETERS) ×1 IMPLANT
CATH INFINITI 6F AL1 (CATHETERS) ×1 IMPLANT
CATH S G BIP PACING (CATHETERS) ×1 IMPLANT
CLOSURE MYNX CONTROL 6F/7F (Vascular Products) ×1 IMPLANT
CLOSURE PERCLOSE PROSTYLE (VASCULAR PRODUCTS) ×2 IMPLANT
COVER SURGICAL LIGHT HANDLE (MISCELLANEOUS) ×1 IMPLANT
DRYSEAL FLEXSHEATH 18FR 33CM (SHEATH) ×1
GUIDEWIRE CNFDA BRKR CVD (WIRE) ×1 IMPLANT
KIT HEART LEFT (KITS) ×2 IMPLANT
KIT MICROPUNCTURE NIT STIFF (SHEATH) ×1 IMPLANT
PACK CARDIAC CATHETERIZATION (CUSTOM PROCEDURE TRAY) ×2 IMPLANT
SHEATH BRITE TIP 7FR 35CM (SHEATH) ×1 IMPLANT
SHEATH DRYSEAL FLEX 18FR 33CM (SHEATH) IMPLANT
SHEATH PINNACLE 6F 10CM (SHEATH) ×1 IMPLANT
SHEATH PINNACLE 8F 10CM (SHEATH) ×1 IMPLANT
SHEATH PROBE COVER 6X72 (BAG) ×1 IMPLANT
SLEEVE REPOSITIONING LENGTH 30 (MISCELLANEOUS) ×1 IMPLANT
STOPCOCK MORSE 400PSI 3WAY (MISCELLANEOUS) ×4 IMPLANT
SYS DEL EVOLUT PROPLS 23 26 29 (CATHETERS) ×2
SYS LOAD EVOLT PROPLS 23 26 29 (CATHETERS) ×2
SYSTEM DEL EVLT PRPLS 23 26 29 (CATHETERS) IMPLANT
SYSTEM LOAD EVLT PRPLS23 26 29 (CATHETERS) IMPLANT
TRANSDUCER W/STOPCOCK (MISCELLANEOUS) ×4 IMPLANT
TUBING CONTRAST HIGH PRESS 48 (TUBING) ×1 IMPLANT
VALVE AORTIC EVOLT PROPLUS 23 (Valve) ×1 IMPLANT
WIRE AMPLATZ SS-J .035X180CM (WIRE) ×1 IMPLANT
WIRE EMERALD 3MM-J .035X150CM (WIRE) ×1 IMPLANT
WIRE EMERALD 3MM-J .035X260CM (WIRE) ×1 IMPLANT
WIRE EMERALD ST .035X260CM (WIRE) ×1 IMPLANT

## 2020-08-30 NOTE — TOC Progression Note (Signed)
Transition of Care [facility]) - Progression Note    Patient Details  Name: Terri King MRN: [id number] Date of Birth: 09-18-1934  Transition of Care [facility]) CM/SW Contact  Ivette Loyal, [place] Phone Number: 08/30/2020, 11:42 AM  Clinical Narrative:    1142: Tresa Endo at [facility] contacted CSW wanting to know what the family's plan was after DC for pt. CSW contacted pt son who stated pt would have to move in with family and outside assistance bc she can no longer live on her own. CSW will update Tresa Endo.        Expected Discharge Plan and Services                                                 Social Determinants of Health (SDOH) Interventions    Readmission Risk Interventions No flowsheet data found.

## 2020-08-30 NOTE — Op Note (Signed)
HEART AND VASCULAR CENTER   MULTIDISCIPLINARY HEART VALVE TEAM   TAVR OPERATIVE NOTE   Date of Procedure:  08/30/2020  Preoperative Diagnosis: Severe Aortic Stenosis   Postoperative Diagnosis: Same   Procedure:   Transcatheter Aortic Valve Replacement - Percutaneous Right Transfemoral Approach  Medtronic CoreValve Evolut ProPlus (size 23 mm, serial # [id number])   Co-Surgeons:  Verne Carrow, MD and Salvatore Decent. Cornelius Moras, MD  Anesthesiologist:  Eilene Ghazi, MD  Echocardiographer:  Charlton Haws, MD  Pre-operative Echo Findings: Severe aortic stenosis Normal left ventricular systolic function  Post-operative Echo Findings: Mild paravalvular leak Normal left ventricular systolic function   BRIEF CLINICAL NOTE AND INDICATIONS FOR SURGERY  Patient is an 86 year old African-American female with history of aortic stenosis, hypertension, hyperlipidemia, remote syncopal event in 2018 and osteoarthritis who has been referred for surgical consultation to discuss treatment options for management of severe aortic stenosis following recent syncopal event.  Patient has been followed for the last several years by Dr. Tenny Craw with known history of aortic stenosis and nonobstructive coronary artery disease.  Most recent follow-up echocardiogram performed February 2021 revealed findings consistent with normal left ventricular systolic function and moderate aortic stenosis.  Patient states that she was in her usual state of health until August 19, 2020 after which time she had suffered a syncopal event the previous day.  At the time she was alone and climbing stairs in her house.  She states that when she came to she was too weak and having pain in her leg such that she could not get herself back up and on her feet.  She was found by her son the next day and brought to the emergency department.  She has suffered left femur fracture.  Neurologic work-up revealed findings consistent with remote history of  occipital stroke.  Following completion of their work-up they were not convinced that the patient's syncopal event was related to a stroke or other neurologic event.  Transthoracic echocardiogram revealed normal left ventricular systolic function with severe aortic stenosis.  She was seen by orthopedic surgery because of her femur fracture but given the presence of syncope with severe aortic stenosis, conservative nonoperative therapy was recommended for the time being.  The patient was evaluated by the cardiology team and referred to the multidisciplinary heart valve team.  Diagnostic cardiac catheterization revealed normal coronary artery anatomy with nonobstructive coronary artery disease.  She has been evaluated previously with Dr. Clifton James and undergo CT angiography to evaluate the feasibility of transcatheter aortic valve replacement.  Cardiothoracic surgical consultation was requested.  During the course of the patient's preoperative work up they have been evaluated comprehensively by a multidisciplinary team of specialists coordinated through the Multidisciplinary [facility]ve Clinic in the [facility].  They have been demonstrated to suffer from symptomatic severe aortic stenosis as noted above. The patient has been counseled extensively as to the relative risks and benefits of all options for the treatment of severe aortic stenosis including long term medical therapy, conventional surgery for aortic valve replacement, and transcatheter aortic valve replacement.  All questions have been answered, and the patient provides full informed consent for the operation as described.   DETAILS OF THE OPERATIVE PROCEDURE  PREPARATION:    The patient is brought to the operating room on the above mentioned date and central monitoring was established by the anesthesia team including placement of a central venous line and radial arterial line. The patient is placed in the supine position  on  the operating table.  Intravenous antibiotics are administered. The patient is monitored closely throughout the procedure under conscious sedation.  Baseline transthoracic echocardiogram was performed. The patient's chest, abdomen, both groins, and both lower extremities are prepared and draped in a sterile manner. A time out procedure is performed.   PERIPHERAL ACCESS:    Using the modified Seldinger technique, femoral arterial and venous access was obtained with placement of 6 Fr sheaths on the left side.  A pigtail diagnostic catheter was passed through the left arterial sheath under fluoroscopic guidance into the aortic root.  The pigtail catheter is positioned in the non-coronary sinus of Valsalva.  A temporary transvenous pacemaker catheter was passed through the left femoral venous sheath under fluoroscopic guidance into the right ventricle.  The pacemaker was tested to ensure stable lead placement and pacemaker capture. Aortic root angiography was performed in order to determine the optimal angiographic angle for valve deployment.   TRANSFEMORAL ACCESS:   Percutaneous transfemoral access and sheath placement was performed by Dr. Clifton James using ultrasound guidance.  The right common femoral artery was cannulated using a micropuncture needle and appropriate location was verified using hand injection angiogram.  A pair of Abbott Perclose percutaneous closure devices were placed and a 6 French sheath replaced into the femoral artery.  The patient was heparinized systemically and ACT verified > 250 seconds.    A 18 Fr transfemoral Gore DrySeal sheath was introduced into the right common femoral artery after progressively dilating over an Amplatz superstiff wire. An AL-1 catheter was used to direct a straight-tip exchange length wire across the native aortic valve into the left ventricle. This was exchanged out for a pigtail catheter and position was confirmed in the LV apex. The pigtail catheter  was exchanged for Confida wire in the LV apex.     TRANSCATHETER HEART VALVE DEPLOYMENT:   A Medtronic CoreValve Evolut ProPlus transcatheter heart valve (size23 mm, serial #[id number]) was prepared and crimped per manufacturer's guidelines, and the proper loading of the valve is confirmed on the [facility] Pro delivery system using flouroscopy. The valve and delivery system were advanced over the guidewire, through the iliac arteries and aorta, and advanced across the aortic arch using flouroscopy. The valve was carefully positioned across the aortic valve annulus. Once appropriate position of the valve has been confirmed by angiographic assessment, the valve is deployed gradually to 80%, at which time a second aortogram was performed to confirm the appropriate depth and position of deployment.   Once final position was confirmed, deployment was completed, the valve released, and the delivery system carefully removed from the aortic root. Valve function is assessed using echocardiography. There is felt to be mild central aortic insufficiency.  The patient's hemodynamic recovery following valve deployment is rapid and uneventful.     PROCEDURE COMPLETION:   The deployment system is and guidewire were removed and femoral artery closure performed by securing the Perclose sutures.  Protamine was administered once femoral arterial repair was complete. The temporary pacemaker was removed.  The pigtail catheters and femoral sheaths were removed with manual pressure used for hemostasis.   The patient tolerated the procedure well and is transported to the surgical intensive care in stable condition. There were no immediate intraoperative complications. All sponge instrument and needle counts are verified correct at completion of the operation.   No blood products were administered during the operation.     Purcell Nails, MD 08/30/2020 2:45 PM

## 2020-08-30 NOTE — Progress Notes (Signed)
PT Cancellation Note  Patient Details Name: Terri King MRN: [id number] DOB: October 01, 1934   Cancelled Treatment:    Reason Eval/Treat Not Completed: Patient at procedure or test/unavailable. Patient having TAVR. Will re-attempt when appropriate.     Javel Hersh 08/30/2020, 12:54 PM

## 2020-08-30 NOTE — Progress Notes (Signed)
  Echocardiogram 2D Echocardiogram has been performed.  Burnard Hawthorne 08/30/2020, 2:33 PM

## 2020-08-30 NOTE — Progress Notes (Addendum)
      [address]       [place] [address]             [phone]     CARDIOTHORACIC SURGERY PROGRESS NOTE  Subjective: Terri King has been scheduled for TRANSCATHETER AORTIC VALVE REPLACEMENT today.   Objective: Vital signs in last 24 hours: Temp:  [98 F (36.7 C)-98.8 F (37.1 C)] 98 F (36.7 C) (07/12 0426) Pulse Rate:  [61-72] 72 (07/12 0426) Cardiac Rhythm: Normal sinus rhythm (07/12 0700) Resp:  [15-20] 20 (07/12 0426) BP: (144-167)/(46-53) 167/53 (07/12 0426) SpO2:  [97 %-99 %] 97 % (07/12 0426) Weight:  [69 kg] 69 kg (07/12 0426)  Physical Exam: Unchanged from previously   Intake/Output from previous day: 07/11 0701 - 07/12 0700 In: 1380 [P.O.:1380] Out: 1350 [Urine:1350] Intake/Output this shift: No intake/output data recorded.  Lab Results: Recent Labs    08/29/20 0428 08/30/20 0208  WBC 9.8 14.0*  HGB 12.4 12.7  HCT 36.1 36.3  PLT 277 289   BMET:  Recent Labs    08/29/20 0428 08/30/20 0208  NA 132* 133*  K 3.2* 3.8  CL 101 104  CO2 23 22  GLUCOSE 118* 140*  BUN 12 15  CREATININE 0.86 0.95  CALCIUM 9.3 9.9    CBG (last 3)  No results for input(s): GLUCAP in the last 72 hours. PT/INR:   Recent Labs    08/29/20 1608  LABPROT 13.7  INR 1.1    Assessment/Plan:   The various methods of treatment have been discussed with the patient. After consideration of the risks, benefits and treatment options the patient has consented to the planned procedure.   The patient has been seen and labs reviewed. There are no changes in the patient's condition to prevent proceeding with the planned procedure today.  However, patient does have abnormal UA suggestive of possible asymptomatic UTI.  Will send urine for culture and give patient one dose of Ciprofloxacin this morning prior to surgery.   Purcell Nails, MD 08/30/2020 9:26 AM

## 2020-08-30 NOTE — CV Procedure (Signed)
HEART AND VASCULAR CENTER  TAVR OPERATIVE NOTE   Date of Procedure:  08/30/2020  Preoperative Diagnosis: Severe Aortic Stenosis   Postoperative Diagnosis: Same   Procedure:   Transcatheter Aortic Valve Replacement - Transfemoral Approach  Medtronic Evolut Pro THV (size 23 mm, model # [id number], serial # [id number])   Co-Surgeons:  Lauree Chandler, MD and Valentina Gu. Roxy Manns, MD   Anesthesiologist:  Kalman Shan  Echocardiographer:  Johnsie Cancel  Pre-operative Echo Findings: Severe aortic stenosis Normal left ventricular systolic function  Post-operative Echo Findings: Mild paravalvular leak Normal left ventricular systolic function  BRIEF CLINICAL NOTE AND INDICATIONS FOR SURGERY  86 yo female with history of aortic stenosis, HTN, hyperlipidemia admitted after a syncopal episode. She has been followed in our office by Dr. Harrington Challenger for moderate aortic stenosis. She has had prior cardiac catheterizations with no evidence of CAD. She is admitted to [facility] 08/19/20 after  a reported syncopal event while climbing the stairs in her house. She was hypokalemic and suffered a left femur fracture. She was found to have an an occipital stroke which per Neurology is most likely an incidental finding. Echo 08/20/20 with LVEF=65-70%. There is basal septal hypertrophy. The aortic valve is thickened and calcified with limited leaflet excursion. Mean gradient 38 mmHg, peak gradient 67 mmhg, AVA 0.9 cm2, dimensionless index 0.36, SVI 38. Cardiac cath 08/24/20 with mild CAD. Normal right heart pressures and normal cardiac output. Cardiac CT with small annular measurement with annular area 289 mm2 but sinus measurement may support a 23 mm Evolut Pro valve. No official read on the CTA abdomen/pelvis but from my review she seems to have adequate sizef iliofemoral vessels to support transfemoral access for TAVR. Ortho planning conservative management of her fracture. Neurology has recommended ASA and Plavix and has  given clearance to proceed with TAVR  During the course of the patient's preoperative work up they have been evaluated comprehensively by a multidisciplinary team of specialists coordinated through the [facility] in the [place].  They have been demonstrated to suffer from symptomatic severe aortic stenosis as noted above. The patient has been counseled extensively as to the relative risks and benefits of all options for the treatment of severe aortic stenosis including long term medical therapy, conventional surgery for aortic valve replacement, and transcatheter aortic valve replacement.  The patient has been independently evaluated by Dr. Roxy Manns with CT surgery and they are felt to be at high risk for conventional surgical aortic valve replacement. The surgeon indicated the patient would be a poor candidate for conventional surgery. Based upon review of all of the patient's preoperative diagnostic tests they are felt to be candidate for transcatheter aortic valve replacement using the transfemoral approach as an alternative to high risk conventional surgery.    Following the decision to proceed with transcatheter aortic valve replacement, a discussion has been held regarding what types of management strategies would be attempted intraoperatively in the event of life-threatening complications, including whether or not the patient would be considered a candidate for the use of cardiopulmonary bypass and/or conversion to open sternotomy for attempted surgical intervention.  The patient has been advised of a variety of complications that might develop peculiar to this approach including but not limited to risks of death, stroke, paravalvular leak, aortic dissection or other major vascular complications, aortic annulus rupture, device embolization, cardiac rupture or perforation, acute myocardial infarction, arrhythmia, heart block or bradycardia requiring  permanent pacemaker placement, congestive heart failure, respiratory  failure, renal failure, pneumonia, infection, other late complications related to structural valve deterioration or migration, or other complications that might ultimately cause a temporary or permanent loss of functional independence or other long term morbidity.  The patient provides full informed consent for the procedure as described and all questions were answered preoperatively.    DETAILS OF THE OPERATIVE PROCEDURE  PREPARATION:   The patient is brought to the operating room on the above mentioned date and central monitoring was established by the anesthesia team including placement of a radial arterial line. The patient is placed in the supine position on the operating table.  Intravenous antibiotics are administered. Conscious sedation is used.   Baseline transthoracic echocardiogram was performed. The patient's chest, abdomen, both groins, and both lower extremities are prepared and draped in a sterile manner. A time out procedure is performed.   PERIPHERAL ACCESS:   Using the modified Seldinger technique, femoral arterial and venous access were obtained with placement of a 6 Fr sheath in the artery and a 7 Fr sheath in the vein on the left side using u/s guidance.  A pigtail diagnostic catheter was passed through the femoral arterial sheath under fluoroscopic guidance into the aortic root.  A temporary transvenous pacemaker catheter was passed through the femoral venous sheath under fluoroscopic guidance into the right ventricle.  The pacemaker was tested to ensure stable lead placement and pacemaker capture. Aortic root angiography was performed in order to determine the optimal angiographic angle for valve deployment.  TRANSFEMORAL ACCESS:  A micropuncture kit was used to gain access to the right femoral artery using u/s guidance. Position confirmed with angiography. Pre-closure with double ProGlide closure devices. The  patient was heparinized systemically and ACT verified > 250 seconds.    A 18 Fr transfemoral Dry Seal sheath was introduced into the right femoral artery over an Amplatz superstiff wire. An AL-1 catheter was used to direct a straight-tip exchange length wire across the native aortic valve into the left ventricle. This was exchanged out for a pigtail catheter and position was confirmed in the LV apex.  The pigtail catheter was then exchanged for an Confida wire in the LV apex.   TRANSCATHETER HEART VALVE DEPLOYMENT:  A Medtronic Evolut Pro THV size  mm was prepared and per manufacturer's guidelines, and the proper orientation of the valve is confirmed on the delivery system.  The valve was advanced through the introducer sheath using normal technique until in an appropriate position in the abdominal aorta beyond the sheath tip. The valve was then advanced across the aortic arch. The valve was carefully positioned across the aortic valve annulus. Once final position of the valve has been confirmed by angiographic assessment, the valve is deployed with controlled rapid pacing. There is felt to be mild paravalvular leak and no central aortic insufficiency.  The patient's hemodynamic recovery following valve deployment is good.  Echo demostrated acceptable post-procedural gradients, stable mitral valve function, and mild AI.   PROCEDURE COMPLETION:  The sheath was then removed and closure devices were completed. Protamine was administered once femoral arterial repair was complete. The temporary pacemaker, pigtail catheters and femoral sheaths were removed with a Mynx closure device placed in the artery and manual pressure used for venous hemostasis.    The patient tolerated the procedure well and is transported to the surgical intensive care in stable condition. There were no immediate intraoperative complications. All sponge instrument and needle counts are verified correct at completion of the operation.  No blood products were administered during the operation.  The patient received a total of 50 mL of intravenous contrast during the procedure.  Lauree Chandler MD 08/30/2020 2:36 PM

## 2020-08-30 NOTE — Progress Notes (Signed)
Arrived from cath lab, S/P TVAR.  Patient A&Ox4.  Son at bedside.  CHG, CCMD notified.  Vital signs per protocol.  Bilateral groins intact/ level 0 Bed rest until 1830.  Leg brace in place Left leg for femur fx.  Patient aware.  Oriented to room

## 2020-08-30 NOTE — Progress Notes (Signed)
PROGRESS NOTE    Terri King  TXM:[id number] DOB: 05-16-1934 DOA: 08/19/2020 PCP: Tresa Garter, MD   Chief Complain:Syncope,fall  Brief Narrative: Terri King is a 86 y.o. female with medical history significant for HTN, moderate AS (per 03/2019 echo), CAD, hx of MI, CAD, OA who presented after being found on the floor at home by family. She reported she passed out and laid on the floor overnight. X-rays revealed a left lateral condyle fracture of the femur. She was evaluated by orthopedic surgery and recommended for a knee immobilizer, touchdown weightbearing, and to follow-up outpatient with [facility] in approximately 2 weeks. Further work-up was commenced regarding her syncope, falling, lethargy/weakness, and confusion as per family. She was found to be on multiple over sedating medications including temazepam, gabapentin, Seroquel. Medications have been weaned down/off over the past few days.  Repeat echo was also ordered and revealed progression of her aortic stenosis, now graded as severe.  MRI brain also obtained which showed a 5 mm acute cortical infarct within the left occipital lobe and tiny chronic lacunar infarcts within the left cerebellar hemisphere. She was evaluated by cardiology at [facility] - having been transferred to [facility] for further evaluation and workup.Plan for TAVR today  Assessment & Plan:   Principal Problem:   Syncope Active Problems:   HYPOKALEMIA   Essential hypertension   Severe aortic stenosis   Fracture of femoral condyle, closed (HCC)   Acute CVA (cerebrovascular accident) (HCC)   Severe AS - Progression from 03/2019 echo, now with severe AS; has a syncopal episode prior to admission - R/L heart cath 7/6 -unremarkable for significant CAD per report -Plan for TAVR as per cardiology   Transient altered mental status - resolved; - At this point, may be multifactorial in setting of polypharmacy (temazepam, gabapentin, Seroquel),  recent CVA - EEG unremarkable - Continue to wean CNS depressants as tolerated.  So far temazepam from 30 mg qhs down to 15 mg qhs, reduced gaba from 300 mg TID to 100 mg TID, and reduced seroquel from BID to 25 mg qhs - tolerated wean so far without incident   Fracture of the left femoral condyle, closed - Status post orthopedics consultation recommended knee immobilizer and follow-up in the office in 2 weeks. -Continue pain meds and supportive care   Acute CVA - Found to have acute 5 mm left occipital infarct seen on MRI brain; also chronic infarcts in left cerebellum; less likely to be cardioembolic given unilateral distribution - Neurology were following -MRA shows small left carotid web with V4 stenosis - TSH (1.542), A1c (5.2 %), Lipid (LDL 83) -Neurology recommended conservative management for vertebral artery stenosis -Currently on aspirin only.  We will check with neurology for further recommendation on antiplatelet regimen after TAVR   Essential hypertension - Continue amlodipine  -Monitor Bp  Hypokalemia: Supplemented with potassium  Debility/deconditioning Patient seen by PT/OT and recommended skilled nursing facility on discharge.  TOC following           DVT prophylaxis:Lovenox Code Status: Full Family Communication: Son at bedside on 08/29/20 Status is: Inpatient  Remains inpatient appropriate because:Inpatient level of care appropriate due to severity of illness  Dispo: The patient is from: Home              Anticipated d/c is to: Home              Patient currently is not medically stable to d/c.   Difficult to place patient No  Consultants: Orthopedics, cardiology  Procedures: None  Antimicrobials:  Anti-infectives (From admission, onward)    Start     Dose/Rate Route Frequency Ordered Stop   08/30/20 1030  ciprofloxacin (CIPRO) tablet 500 mg        500 mg Oral STAT 08/30/20 0958 08/30/20 1114   08/30/20 0915  vancomycin (VANCOCIN) IVPB 1000  mg/200 mL premix        1,000 mg 200 mL/hr over 60 Minutes Intravenous  Once 08/30/20 0826 08/30/20 1245   08/21/20 1000  valACYclovir (VALTREX) tablet 1,000 mg  Status:  Discontinued        1,000 mg Oral 3 times daily 08/21/20 0756 08/21/20 1105       Subjective:  Patient seen and examined at the bedside this morning.  Hemodynamically stable.  Denies any complaints   Objective: Vitals:   08/29/20 1126 08/29/20 1939 08/30/20 0426 08/30/20 1147  BP:  (!) 144/46 (!) 167/53 (!) 172/44  Pulse: 69 61 72 73  Resp:  15 20 17   Temp: 98.8 F (37.1 C) 98.2 F (36.8 C) 98 F (36.7 C) 98.9 F (37.2 C)  TempSrc: Oral Oral Oral Oral  SpO2: 99% 99% 97% 99%  Weight:   69 kg 69 kg  Height:    5\' 3"  (1.6 m)    Intake/Output Summary (Last 24 hours) at 08/30/2020 1322 Last data filed at 08/30/2020 0600 Gross per 24 hour  Intake 480 ml  Output 950 ml  Net -470 ml   Filed Weights   08/29/20 0433 08/30/20 0426 08/30/20 1147  Weight: 69.5 kg 69 kg 69 kg    Examination:  General exam: Overall comfortable, not in distress HEENT: PERRL Respiratory system:  no wheezes or crackles  Cardiovascular system: S1 & S2 heard, RRR.  Gastrointestinal system: Abdomen is nondistended, soft and nontender. Central nervous system: Alert and oriented Extremities: No edema, no clubbing ,no cyanosis Skin: No rashes, no ulcers,no icterus    Data Reviewed: I have personally reviewed following labs and imaging studies  CBC: Recent Labs  Lab 08/24/20 0331 08/24/20 1645 08/25/20 0332 08/26/20 0544 08/27/20 0134 08/28/20 0307 08/29/20 0428 08/30/20 0208  WBC 9.4   < > 13.7* 10.2 8.8 10.2 9.8 14.0*  NEUTROABS 5.3  --  11.3* 6.3 5.0 6.3  --   --   HGB 12.4   < > 12.6 12.8 12.1 12.0 12.4 12.7  HCT 36.6   < > 36.6 37.6 34.6* 35.8* 36.1 36.3  MCV 92.9   < > 92.4 93.3 92.3 93.2 91.4 91.4  PLT 228   < > 253 170 240 265 277 289   < > = values in this interval not displayed.   Basic Metabolic  Panel: Recent Labs  Lab 08/24/20 0331 08/24/20 1645 08/25/20 0332 08/26/20 0544 08/27/20 0134 08/28/20 0307 08/29/20 0428 08/30/20 0208  NA 140   < > 133* 134* 133* 135 132* 133*  K 4.5   < > 4.2 3.9 3.8 3.6 3.2* 3.8  CL 107  --  104 104 103 101 101 104  CO2 25  --  20* 21* 25 27 23 22   GLUCOSE 119*  --  133* 109* 117* 125* 118* 140*  BUN 15  --  15 14 13 13 12 15   CREATININE 0.86   < > 0.80 0.85 0.99 1.00 0.86 0.95  CALCIUM 9.8  --  9.9 9.8 9.5 9.9 9.3 9.9  MG 1.8  --  1.8 2.0 2.0 2.0  --   --    < > =  values in this interval not displayed.   GFR: Estimated Creatinine Clearance: 41.1 mL/min (by C-G formula based on SCr of 0.95 mg/dL). Liver Function Tests: Recent Labs  Lab 08/29/20 0428  AST 24  ALT 32  ALKPHOS 92  BILITOT 1.1  PROT 6.2*  ALBUMIN 3.0*   No results for input(s): LIPASE, AMYLASE in the last 168 hours. No results for input(s): AMMONIA in the last 168 hours. Coagulation Profile: Recent Labs  Lab 08/29/20 0428 08/29/20 1608  INR 1.0 1.1   Cardiac Enzymes: No results for input(s): CKTOTAL, CKMB, CKMBINDEX, TROPONINI in the last 168 hours. BNP (last 3 results) No results for input(s): PROBNP in the last 8760 hours. HbA1C: Recent Labs    08/30/20 0208  HGBA1C 5.4   CBG: No results for input(s): GLUCAP in the last 168 hours. Lipid Profile: No results for input(s): CHOL, HDL, LDLCALC, TRIG, CHOLHDL, LDLDIRECT in the last 72 hours. Thyroid Function Tests: No results for input(s): TSH, T4TOTAL, FREET4, T3FREE, THYROIDAB in the last 72 hours. Anemia Panel: No results for input(s): VITAMINB12, FOLATE, FERRITIN, TIBC, IRON, RETICCTPCT in the last 72 hours. Sepsis Labs: No results for input(s): PROCALCITON, LATICACIDVEN in the last 168 hours.  Recent Results (from the past 240 hour(s))  Surgical pcr screen     Status: Abnormal   Collection Time: 08/29/20  5:40 PM   Specimen: Nasal Mucosa; Nasal Swab  Result Value Ref Range Status   MRSA, PCR  POSITIVE (A) NEGATIVE Final    Comment: RESULT CALLED TO, READ BACK BY AND VERIFIED WITH: NITURIDA,L RN 08/29/2020 AT 2342 SKEEN,P    Staphylococcus aureus POSITIVE (A) NEGATIVE Final    Comment: (NOTE) The Xpert SA Assay (FDA approved for NASAL specimens in patients 71 years of age and older), is one component of a comprehensive surveillance program. It is not intended to diagnose infection nor to guide or monitor treatment. Performed at [facility], [address] [place], [place] [address]          Radiology Studies: DG Chest 2 View  Result Date: 08/29/2020 CLINICAL DATA:  Preop evaluation for upcoming TAVR EXAM: CHEST - 2 VIEW COMPARISON:  08/19/2020 FINDINGS: Cardiac shadow is within normal limits. Calcific changes of the thoracic aorta are noted. The lungs are clear bilaterally. No focal infiltrate or effusion is noted. Degenerative changes of the shoulder joints are seen bilaterally. IMPRESSION: No acute abnormality noted. Aortic Atherosclerosis (ICD10-I70.0). Electronically Signed   By: Alcide Clever M.D.   On: 08/29/2020 21:39        Scheduled Meds:  [MAR Hold] amLODipine  5 mg Oral Daily   [MAR Hold] aspirin EC  81 mg Oral Daily   [MAR Hold] Chlorhexidine Gluconate Cloth  6 each Topical Daily   [MAR Hold] enoxaparin (LOVENOX) injection  30 mg Subcutaneous Q24H   [MAR Hold] escitalopram  10 mg Oral Daily   [MAR Hold] famotidine  40 mg Oral Daily   [MAR Hold] gabapentin  100 mg Oral TID   [MAR Hold] metoprolol succinate  50 mg Oral Daily   [MAR Hold] mupirocin ointment  1 application Nasal BID   [MAR Hold] QUEtiapine  25 mg Oral QHS   [MAR Hold] sodium chloride flush  3 mL Intravenous Q12H   [MAR Hold] sodium chloride flush  3 mL Intravenous Q12H   [MAR Hold] temazepam  15 mg Oral QHS   Continuous Infusions:  [MAR Hold] sodium chloride       LOS: 11 days  Time spent: 25 mins,More than 50% of that time was spent in counseling and/or coordination of  care.      Burnadette PopAmrit Stephano Arrants, MD [facility] P7/01/2021, 1:22 PM

## 2020-08-30 NOTE — Anesthesia Procedure Notes (Signed)
Arterial Line Insertion Start/End7/01/2021 12:26 PM Performed by: CRNA  Patient location: Pre-op. Preanesthetic checklist: patient identified, IV checked, site marked, risks and benefits discussed, surgical consent, monitors and equipment checked, pre-op evaluation, timeout performed and anesthesia consent Lidocaine 1% used for infiltration radial was placed Catheter size: 20 Fr Hand hygiene performed  and maximum sterile barriers used   Attempts: 1 Procedure performed without using ultrasound guided technique. Following insertion, dressing applied. Post procedure assessment: normal and unchanged  Additional procedure comments: Placed by SRNA.

## 2020-08-30 NOTE — Progress Notes (Signed)
LOCATION: right RADIAL  DRESSING APPLIED: gauze with tegaderm  SITE UPON ARRIVAL: LEVEL 0  SITE AFTER BAND REMOVAL: LEVEL 0  CIRCULATION SENSATION AND  MOVEMENT: +2 radial pulse  COMMENTS: a-line removed, manual pressure held for approximately 10 minutes

## 2020-08-30 NOTE — Progress Notes (Signed)
Progress Note  Patient Name: Terri King Date of Encounter: 08/30/2020  [facility] Cardiologist: Dietrich Pates, MD   Subjective   No issues overnight. Sodium mildly improved today at 133. There is a new leukocytosis - WBC 14K. Urinalysis is nitrite positive with white cells and "rare" bacteria - afebrile. Plan for TAVR today -preop antibiotics ordered (ancef/vancomycin). She did nasal swab positive for MRSA.  Inpatient Medications    Scheduled Meds:  amLODipine  5 mg Oral Daily   aspirin EC  81 mg Oral Daily   chlorhexidine  1 application Topical Once   chlorhexidine       Chlorhexidine Gluconate Cloth  6 each Topical Daily   diphenhydrAMINE  50 mg Oral Once   enoxaparin (LOVENOX) injection  30 mg Subcutaneous Q24H   escitalopram  10 mg Oral Daily   famotidine  40 mg Oral Daily   gabapentin  100 mg Oral TID   metoprolol succinate  50 mg Oral Daily   mupirocin ointment  1 application Nasal BID   predniSONE  50 mg Oral Q6H   QUEtiapine  25 mg Oral QHS   sodium chloride flush  3 mL Intravenous Q12H   sodium chloride flush  3 mL Intravenous Q12H   temazepam  15 mg Oral QHS   Continuous Infusions:  sodium chloride     vancomycin     PRN Meds: sodium chloride, HYDROcodone-acetaminophen, ibuprofen, menthol-cetylpyridinium, morphine injection, nitroGLYCERIN, ondansetron (ZOFRAN) IV, senna-docusate, sodium chloride flush, temazepam   Vital Signs    Vitals:   08/29/20 1126 08/29/20 1126 08/29/20 1939 08/30/20 0426  BP:   (!) 144/46 (!) 167/53  Pulse: 69 69 61 72  Resp:   15 20  Temp: 98.8 F (37.1 C) 98.8 F (37.1 C) 98.2 F (36.8 C) 98 F (36.7 C)  TempSrc: Oral Oral Oral Oral  SpO2:  99% 99% 97%  Weight:    69 kg  Height:        Intake/Output Summary (Last 24 hours) at 08/30/2020 0859 Last data filed at 08/30/2020 0600 Gross per 24 hour  Intake 780 ml  Output 1350 ml  Net -570 ml   Last 3 Weights 08/30/2020 08/29/2020 08/28/2020  Weight (lbs) 152 lb 1.9 oz  153 lb 3.5 oz 152 lb 8.9 oz  Weight (kg) 69 kg 69.5 kg 69.2 kg      Telemetry    NSR - Personally Reviewed  ECG    No new tracing - Personally Reviewed  Physical Exam   GEN: No acute distress.   Neck: No JVD Cardiac: RRR, late-peaking 3/6 SEM at RUSB, no diastolic murmurs, rubs, or gallops.  Respiratory: Clear to auscultation bilaterally. GI: Soft, nontender, non-distended  MS: No edema; No deformity. Neuro:  Nonfocal  Psych: Normal affect   Labs    High Sensitivity Troponin:  No results for input(s): TROPONINIHS in the last 720 hours.    Chemistry Recent Labs  Lab 08/28/20 0307 08/29/20 0428 08/30/20 0208  NA 135 132* 133*  K 3.6 3.2* 3.8  CL 101 101 104  CO2 27 23 22   GLUCOSE 125* 118* 140*  BUN 13 12 15   CREATININE 1.00 0.86 0.95  CALCIUM 9.9 9.3 9.9  PROT  --  6.2*  --   ALBUMIN  --  3.0*  --   AST  --  24  --   ALT  --  32  --   ALKPHOS  --  92  --   BILITOT  --  1.1  --   GFRNONAA 56* >60 59*  ANIONGAP 7 8 7      Hematology Recent Labs  Lab 08/28/20 0307 08/29/20 0428 08/30/20 0208  WBC 10.2 9.8 14.0*  RBC 3.84* 3.95 3.97  HGB 12.0 12.4 12.7  HCT 35.8* 36.1 36.3  MCV 93.2 91.4 91.4  MCH 31.3 31.4 32.0  MCHC 33.5 34.3 35.0  RDW 13.2 13.2 13.2  PLT 265 277 289    BNPNo results for input(s): BNP, PROBNP in the last 168 hours.   DDimer No results for input(s): DDIMER in the last 168 hours.   Radiology    DG Chest 2 View  Result Date: 08/29/2020 CLINICAL DATA:  Preop evaluation for upcoming TAVR EXAM: CHEST - 2 VIEW COMPARISON:  08/19/2020 FINDINGS: Cardiac shadow is within normal limits. Calcific changes of the thoracic aorta are noted. The lungs are clear bilaterally. No focal infiltrate or effusion is noted. Degenerative changes of the shoulder joints are seen bilaterally. IMPRESSION: No acute abnormality noted. Aortic Atherosclerosis (ICD10-I70.0). Electronically Signed   By: 10/20/2020 M.D.   On: 08/29/2020 21:39    Cardiac Studies     TAVR CTA 08/26/2020 1. Tricuspid aortic valve with severe aortic stenosis (calcium score 1457). 2. Small annular measurements (292 mm2). Sinus measurements and heights support a 23 mm Evolut Pro TAVR. 3. No significant annular calcifications noted. There is mild LVOT calcification under the LCC that extends to the anterior mitral valve. 4.  Sufficient coronary to annulus distance. 5.  Optimal Fluoroscopic Angle for Delivery: LAO 10 CAU 9 6.  Dilated pulmonary artery suggestive of pulmonary hypertension. 7.  Moderate to severe mitral annular calcification noted.     Cath 08/24/2020 Normal Right Heart Cath Pressures-PAP 37/8 mmHg with a mean of 22 mmHg. Prox RCA lesion is 20% stenosed.-Otherwise minimal if any CAD with very tortuous vessels.   SUMMARY Documented essentially severe aortic stenosis on echocardiogram.  Aortic valve not crossed. Angiographically normal coronary arteries but very tortuous consistent with hypertensive heart disease Severe systemic hypertension with pressures ranging from 170 to 200 mmHg systolic. Normal Right Heart Cath Pressures As Well As Cardiac Output and Index     RECOMMENDATIONS Continue evaluation for syncope, if considered to be related to aortic stenosis, would likely need to wait until leg healed. Need more aggressive blood pressure management. Echo 08/20/2020 1. The aortic valve is calcified. There is severe calcifcation of the  aortic valve. There is severe thickening of the aortic valve. Aortic valve regurgitation is mild. Severe aortic valve stenosis. AVA 0.9cm2, mean gradient 10/21/2020, peak gradient ,   Vmax 4.85m/s   2. Left ventricular ejection fraction, by estimation, is 65 to 70%. Left  ventricular ejection fraction by 3D volume is 71 %. The left ventricle has normal function. The left ventricle has no regional wall motion  abnormalities. There is moderate asymmetric hypertrophy of the basal-septum. The rest of the LV segments demonstrate  mild left ventricular hypertrophy. Left ventricular diastolic parameters are consistent with Grade I diastolic dysfunction (impaired  relaxation). Elevated left atrial pressure. The average left ventricular global longitudinal strain is -20.2  %. The global longitudinal strain is normal.   3. Right ventricular systolic function is normal. The right ventricular  size is normal. There is severely elevated pulmonary artery systolic  pressure. The estimated right ventricular systolic pressure is 86.2 mmHg.   4. The mitral valve is abnormal. There is moderate thickening of the  mitral valve leaflet(s). There is moderate calcification of the  mitral  valve leaflet(s). Moderate to severe mitral annular calcification. Trivial mitral valve regurgitation. Mild calcific mitral stenosis with MVA 1.5cm2 by continuity, mean gradient at HR 74bpm.   5. The inferior vena cava is dilated in size with >50% respiratory  variability, suggesting right atrial pressure of 8 mmHg.   Comparison(s): Compared to prior TTE in 03/2019, the aortic stenosis is now severe (previously moderate) and there is severe pulmonary hypertension with PASP .  Patient Profile     86 y.o. female with fall, probable syncope, found to have progression of AS to severe range, small subacute L occipital ischemic stroke, background severe HTN and HLP.   Assessment & Plan    Severe AS: presentation w syncope. TAVR scheduled 07/12 at 1200h w Dr. Clifton James. CVA: incidental, asymptomatic. Not a reason to delay TAVR per Neuro eval. L lateral femoral condyle fracture: immobilizer and conservative mgmt for next 2 weeks. HTN: mildly high SBP, but DBP<60. No additional meds, at least not until after TAVR. HLP: fair LDL around 80. Minimal CAD, PAD. May benefit from low dose statin to reach LDL<70. 6.   Hypokalemia: improved to 3.8 - diuretic being held, sodium slightly increased 7.   Leukocytosis: WBC increased to 14K - urinalysis shows  leukocytes and nitrite. No urinary symptoms - no fever - d/w Dr. Clifton James - will get preop ABx - he will still proceed with TAVR today.  For questions or updates, please contact [facility] Please consult [email or web address] for contact info under   Chrystie Nose, MD, Milagros Loll  [place]  [facility]  Medical Director of the [facility]  [facility] Diplomate of the American Board of Clinical Lipidology Attending Cardiologist  Direct Dial: [phone]  Fax: [phone]  Website:  [email or web address]  Chrystie Nose, MD  08/30/2020, 8:59 AM

## 2020-08-30 NOTE — Anesthesia Preprocedure Evaluation (Signed)
Anesthesia Evaluation  Patient identified by MRN, date of birth, ID band Patient awake    Reviewed: Allergy & Precautions, NPO status , Patient's Chart, lab work & pertinent test results  Airway Mallampati: II  TM Distance: >3 FB Neck ROM: Full    Dental no notable dental hx.    Pulmonary neg pulmonary ROS,    Pulmonary exam normal breath sounds clear to auscultation       Cardiovascular hypertension, + Valvular Problems/Murmurs AS  Rhythm:Regular Rate:Normal + Systolic murmurs Pulmonary HTN Mitral stenosis   Neuro/Psych negative neurological ROS  negative psych ROS   GI/Hepatic negative GI ROS, Neg liver ROS,   Endo/Other  negative endocrine ROS  Renal/GU negative Renal ROS  negative genitourinary   Musculoskeletal negative musculoskeletal ROS (+)   Abdominal   Peds negative pediatric ROS (+)  Hematology negative hematology ROS (+)   Anesthesia Other Findings   Reproductive/Obstetrics negative OB ROS                             Anesthesia Physical Anesthesia Plan  ASA: 4  Anesthesia Plan: MAC   Post-op Pain Management:    Induction: Intravenous  PONV Risk Score and Plan: 2 and Propofol infusion and Treatment may vary due to age or medical condition  Airway Management Planned: Simple Face Mask  Additional Equipment:   Intra-op Plan:   Post-operative Plan:   Informed Consent: I have reviewed the patients History and Physical, chart, labs and discussed the procedure including the risks, benefits and alternatives for the proposed anesthesia with the patient or authorized representative who has indicated his/her understanding and acceptance.     Dental advisory given  Plan Discussed with: CRNA and Surgeon  Anesthesia Plan Comments:         Anesthesia Quick Evaluation

## 2020-08-30 NOTE — Progress Notes (Signed)
In and out cath done per order small cloudy urine return post void, urine culture sent to the lab per order.

## 2020-08-30 NOTE — Transfer of Care (Signed)
Immediate Anesthesia Transfer of Care Note  Patient: [name]  Procedure(s) Performed: TRANSCATHETER AORTIC VALVE REPLACEMENT, TRANSFEMORAL TRANSESOPHAGEAL ECHOCARDIOGRAM (TEE)  Patient Location: PACU and Cath Lab  Anesthesia Type:MAC  Level of Consciousness: awake, alert  and oriented  Airway & Oxygen Therapy: Patient Spontanous Breathing  Post-op Assessment: Report given to RN and Post -op Vital signs reviewed and stable  Post vital signs: Reviewed and stable  Last Vitals:  Vitals Value Taken Time  BP 126/39 08/30/20 1446  Temp    Pulse 72 08/30/20 1449  Resp 20 08/30/20 1449  SpO2 99 % 08/30/20 1449  Vitals shown include unvalidated device data.  Last Pain:  Vitals:   08/30/20 1202  TempSrc:   PainSc: 0-No pain      Patients Stated Pain Goal: 2 (16/10/96 0454)  Complications: No notable events documented.

## 2020-08-31 ENCOUNTER — Inpatient Hospital Stay (HOSPITAL_COMMUNITY): Payer: Medicare HMO

## 2020-08-31 ENCOUNTER — Encounter (HOSPITAL_COMMUNITY): Payer: Self-pay | Admitting: Cardiovascular Disease

## 2020-08-31 DIAGNOSIS — Z952 Presence of prosthetic heart valve: Secondary | ICD-10-CM

## 2020-08-31 LAB — BASIC METABOLIC PANEL
Anion gap: 8 (ref 5–15)
BUN: 18 mg/dL (ref 8–23)
CO2: 22 mmol/L (ref 22–32)
Calcium: 9.7 mg/dL (ref 8.9–10.3)
Chloride: 105 mmol/L (ref 98–111)
Creatinine, Ser: 0.86 mg/dL (ref 0.44–1.00)
GFR, Estimated: >60 mL/min (ref >60)
Glucose, Bld: 138 mg/dL — ABNORMAL HIGH (ref 70–99)
Potassium: 3.5 mmol/L (ref 3.5–5.1)
Sodium: 135 mmol/L (ref 135–145)

## 2020-08-31 LAB — ECHOCARDIOGRAM COMPLETE
AR max vel: 1.43 cm2
AV Area VTI: 1.81 cm2
AV Area mean vel: 1.72 cm2
AV Mean grad: 11 mmHg
AV Peak grad: 26.5 mmHg
Ao pk vel: 2.57 m/s
Area-P 1/2: 1.83 cm2
Height: 63 in
S' Lateral: 2.6 cm
Weight: 2553.81 oz

## 2020-08-31 LAB — URINALYSIS, ROUTINE W REFLEX MICROSCOPIC
Bilirubin Urine: NEGATIVE
Glucose, UA: NEGATIVE mg/dL
Hgb urine dipstick: NEGATIVE
Ketones, ur: NEGATIVE mg/dL
Nitrite: NEGATIVE
Protein, ur: NEGATIVE mg/dL
Specific Gravity, Urine: 1.024 (ref 1.005–1.030)
pH: 5 (ref 5.0–8.0)

## 2020-08-31 LAB — POCT I-STAT, CHEM 8
BUN: 16 mg/dL (ref 8–23)
Calcium, Ion: 1.32 mmol/L (ref 1.15–1.40)
Chloride: 106 mmol/L (ref 98–111)
Creatinine, Ser: 0.7 mg/dL (ref 0.44–1.00)
Glucose, Bld: 152 mg/dL — ABNORMAL HIGH (ref 70–99)
HCT: 31 % — ABNORMAL LOW (ref 36.0–46.0)
Hemoglobin: 10.5 g/dL — ABNORMAL LOW (ref 12.0–15.0)
Potassium: 3.8 mmol/L (ref 3.5–5.1)
Sodium: 138 mmol/L (ref 135–145)
TCO2: 19 mmol/L — ABNORMAL LOW (ref 22–32)

## 2020-08-31 LAB — CBC WITH DIFFERENTIAL/PLATELET
Abs Immature Granulocytes: 0.28 10*3/uL — ABNORMAL HIGH (ref 0.00–0.07)
Basophils Absolute: 0 10*3/uL (ref 0.0–0.1)
Basophils Relative: 0 %
Eosinophils Absolute: 0 10*3/uL (ref 0.0–0.5)
Eosinophils Relative: 0 %
HCT: 33.3 % — ABNORMAL LOW (ref 36.0–46.0)
Hemoglobin: 11.4 g/dL — ABNORMAL LOW (ref 12.0–15.0)
Immature Granulocytes: 1 %
Lymphocytes Relative: 5 %
Lymphs Abs: 1.1 10*3/uL (ref 0.7–4.0)
MCH: 31.6 pg (ref 26.0–34.0)
MCHC: 34.2 g/dL (ref 30.0–36.0)
MCV: 92.2 fL (ref 80.0–100.0)
Monocytes Absolute: 1.9 10*3/uL — ABNORMAL HIGH (ref 0.1–1.0)
Monocytes Relative: 8 %
Neutro Abs: 20.9 10*3/uL — ABNORMAL HIGH (ref 1.7–7.7)
Neutrophils Relative %: 86 %
Platelets: 278 10*3/uL (ref 150–400)
RBC: 3.61 MIL/uL — ABNORMAL LOW (ref 3.87–5.11)
RDW: 13.3 % (ref 11.5–15.5)
WBC: 24.2 10*3/uL — ABNORMAL HIGH (ref 4.0–10.5)
nRBC: 0 % (ref 0.0–0.2)

## 2020-08-31 LAB — MAGNESIUM: Magnesium: 2.1 mg/dL (ref 1.7–2.4)

## 2020-08-31 NOTE — Progress Notes (Signed)
PROGRESS NOTE    Terri King  UEA:[id number] DOB: 11/06/1934 DOA: 08/19/2020 PCP: Tresa Garter, MD   Chief Complain:Syncope,fall  Brief Narrative: Terri FREDIANI is a 86 y.o. female with medical history significant for HTN, moderate AS (per 03/2019 echo), CAD, hx of MI, CAD, OA who presented after being found on the floor at home by family. She reported she passed out and laid on the floor overnight. X-rays revealed a left lateral condyle fracture of the femur. She was evaluated by orthopedic surgery and recommended for a knee immobilizer, touchdown weightbearing, and to follow-up outpatient with [facility] in approximately 2 weeks. Further work-up was commenced regarding her syncope, falling, lethargy/weakness, and confusion as per family. She was found to be on multiple over sedating medications including temazepam, gabapentin, Seroquel. Medications have been weaned down/off over the past few days.  Repeat echo was also ordered and revealed progression of her aortic stenosis, now graded as severe.  MRI brain also obtained which showed a 5 mm acute cortical infarct within the left occipital lobe and tiny chronic lacunar infarcts within the left cerebellar hemisphere. She was evaluated by cardiology at [facility] - having been transferred to [facility] for further evaluation and workup.Underwent TAVR on 08/30/20  Assessment & Plan:   Principal Problem:   Syncope Active Problems:   HYPOKALEMIA   Essential hypertension   Severe aortic stenosis   Fracture of femoral condyle, closed (HCC)   Acute CVA (cerebrovascular accident) (HCC)   S/P TAVR (transcatheter aortic valve replacement)   Severe AS - Progression from 03/2019 echo, now with severe AS; has a syncopal episode prior to admission - R/L heart cath 7/6 -unremarkable for significant CAD per report -S/P TAVR on 08/30/20   Transient altered mental status - resolved; - At this point, may be multifactorial in setting of  polypharmacy (temazepam, gabapentin, Seroquel), recent CVA - EEG unremarkable - Continue to wean CNS depressants as tolerated.  So far temazepam from 30 mg qhs down to 15 mg qhs, reduced gaba from 300 mg TID to 100 mg TID, and reduced seroquel from BID to 25 mg qhs - tolerated wean so far without incident   Fracture of the left femoral condyle, closed - Status post orthopedics consultation recommended knee immobilizer and follow-up in the office in 2 weeks. -Continue pain meds and supportive care   Acute CVA - Found to have acute 5 mm left occipital infarct seen on MRI brain; also chronic infarcts in left cerebellum; less likely to be cardioembolic given unilateral distribution - Neurology were following -MRA shows small left carotid web with V4 stenosis - TSH (1.542), A1c (5.2 %), Lipid (LDL 83) -Neurology recommended conservative management for vertebral artery stenosis -Currently on aspirin and plavix   Essential hypertension - Continue amlodipine  -Monitor Bp  Leukocytosis Unclear etiology.  Chest x-ray done on 08/30/2020 did not show any pneumonia.  Will check UA.  Continue to monitor patient is afebrile.  Low suspicion for infectious etiology.  Debility/deconditioning Patient seen by PT/OT and recommended skilled nursing facility on discharge.  TOC following           DVT prophylaxis:Lovenox Code Status: Full Family Communication: called son on phone,call not received Status is: Inpatient  Remains inpatient appropriate because:Inpatient level of care appropriate due to severity of illness  Dispo: The patient is from: Home              Anticipated d/c is to: Home  Patient currently is not medically stable to d/c.   Difficult to place patient No     Consultants: Orthopedics, cardiology  Procedures: None  Antimicrobials:  Anti-infectives (From admission, onward)    Start     Dose/Rate Route Frequency Ordered Stop   08/30/20 2200  ceFAZolin (ANCEF)  IVPB 2g/100 mL premix        2 g 200 mL/hr over 30 Minutes Intravenous Every 8 hours 08/30/20 1914 08/31/20 0533   08/30/20 1030  ciprofloxacin (CIPRO) tablet 500 mg        500 mg Oral STAT 08/30/20 0958 08/30/20 1114   08/30/20 0915  vancomycin (VANCOCIN) IVPB 1000 mg/200 mL premix        1,000 mg 200 mL/hr over 60 Minutes Intravenous  Once 08/30/20 0826 08/30/20 1245   08/21/20 1000  valACYclovir (VALTREX) tablet 1,000 mg  Status:  Discontinued        1,000 mg Oral 3 times daily 08/21/20 0756 08/21/20 1105       Subjective:  Patient seen and examined at bedside this morning. Hemodynamically stable, comfortable ,denied any complaints.  No symptoms of dysuria,denied any cough or SOB  Objective: Vitals:   08/31/20 0400 08/31/20 0411 08/31/20 0700 08/31/20 0741  BP: (!) 142/60 (!) 142/60 (!) 74/58 127/70  Pulse:  79 (!) 38 71  Resp: (!) 21 20 (!) 24 20  Temp:  (!) 97.4 F (36.3 C)  97.8 F (36.6 C)  TempSrc:  Oral  Oral  SpO2:  100% (!) 85% 100%  Weight:   72.4 kg   Height:        Intake/Output Summary (Last 24 hours) at 08/31/2020 0831 Last data filed at 08/31/2020 0700 Gross per 24 hour  Intake 1190 ml  Output 350 ml  Net 840 ml   Filed Weights   08/30/20 0426 08/30/20 1147 08/31/20 0700  Weight: 69 kg 69 kg 72.4 kg    Examination:  General exam: Overall comfortable, not in distress, pleasant female HEENT: PERRL Respiratory system:  no wheezes or crackles  Cardiovascular system: S1 & S2 heard, RRR.  Gastrointestinal system: Abdomen is nondistended, soft and nontender. Central nervous system: Alert and oriented Extremities: No edema, no clubbing ,no cyanosis, knee immobilizer on the left knee Skin: No rashes, no ulcers,no icterus    Data Reviewed: I have personally reviewed following labs and imaging studies  CBC: Recent Labs  Lab 08/25/20 0332 08/26/20 0544 08/27/20 0134 08/28/20 0307 08/29/20 0428 08/30/20 0208 08/30/20 1310 08/30/20 1518  08/31/20 0625  WBC 13.7* 10.2 8.8 10.2 9.8 14.0*  --   --  24.2*  NEUTROABS 11.3* 6.3 5.0 6.3  --   --   --   --  20.9*  HGB 12.6 12.8 12.1 12.0 12.4 12.7 13.3 10.5* 11.4*  HCT 36.6 37.6 34.6* 35.8* 36.1 36.3 39.0 31.0* 33.3*  MCV 92.4 93.3 92.3 93.2 91.4 91.4  --   --  92.2  PLT 253 170 240 265 277 289  --   --  278   Basic Metabolic Panel: Recent Labs  Lab 08/25/20 0332 08/26/20 0544 08/27/20 0134 08/28/20 0307 08/29/20 0428 08/30/20 0208 08/30/20 1310 08/30/20 1518 08/31/20 0625  NA 133* 134* 133* 135 132* 133* 136 138 135  K 4.2 3.9 3.8 3.6 3.2* 3.8 3.8 3.8 3.5  CL 104 104 103 101 101 104 106 106 105  CO2 20* 21* 25 27 23 22   --   --  22  GLUCOSE 133* 109* 117* 125* 118*  140* 159* 152* 138*  BUN 15 14 13 13 12 15 17 16 18   CREATININE 0.80 0.85 0.99 1.00 0.86 0.95 0.80 0.70 0.86  CALCIUM 9.9 9.8 9.5 9.9 9.3 9.9  --   --  9.7  MG 1.8 2.0 2.0 2.0  --   --   --   --  2.1   GFR: Estimated Creatinine Clearance: 46.4 mL/min (by C-G formula based on SCr of 0.86 mg/dL). Liver Function Tests: Recent Labs  Lab 08/29/20 0428  AST 24  ALT 32  ALKPHOS 92  BILITOT 1.1  PROT 6.2*  ALBUMIN 3.0*   No results for input(s): LIPASE, AMYLASE in the last 168 hours. No results for input(s): AMMONIA in the last 168 hours. Coagulation Profile: Recent Labs  Lab 08/29/20 0428 08/29/20 1608  INR 1.0 1.1   Cardiac Enzymes: No results for input(s): CKTOTAL, CKMB, CKMBINDEX, TROPONINI in the last 168 hours. BNP (last 3 results) No results for input(s): PROBNP in the last 8760 hours. HbA1C: Recent Labs    08/30/20 0208  HGBA1C 5.4   CBG: No results for input(s): GLUCAP in the last 168 hours. Lipid Profile: No results for input(s): CHOL, HDL, LDLCALC, TRIG, CHOLHDL, LDLDIRECT in the last 72 hours. Thyroid Function Tests: No results for input(s): TSH, T4TOTAL, FREET4, T3FREE, THYROIDAB in the last 72 hours. Anemia Panel: No results for input(s): VITAMINB12, FOLATE, FERRITIN,  TIBC, IRON, RETICCTPCT in the last 72 hours. Sepsis Labs: No results for input(s): PROCALCITON, LATICACIDVEN in the last 168 hours.  Recent Results (from the past 240 hour(s))  Surgical pcr screen     Status: Abnormal   Collection Time: 08/29/20  5:40 PM   Specimen: Nasal Mucosa; Nasal Swab  Result Value Ref Range Status   MRSA, PCR POSITIVE (A) NEGATIVE Final    Comment: RESULT CALLED TO, READ BACK BY AND VERIFIED WITH: NITURIDA,L RN 08/29/2020 AT 2342 SKEEN,P    Staphylococcus aureus POSITIVE (A) NEGATIVE Final    Comment: (NOTE) The Xpert SA Assay (FDA approved for NASAL specimens in patients 84 years of age and older), is one component of a comprehensive surveillance program. It is not intended to diagnose infection nor to guide or monitor treatment. Performed at [facility], [address] [place], [place] [address]          Radiology Studies: DG Chest 2 View  Result Date: 08/29/2020 CLINICAL DATA:  Preop evaluation for upcoming TAVR EXAM: CHEST - 2 VIEW COMPARISON:  08/19/2020 FINDINGS: Cardiac shadow is within normal limits. Calcific changes of the thoracic aorta are noted. The lungs are clear bilaterally. No focal infiltrate or effusion is noted. Degenerative changes of the shoulder joints are seen bilaterally. IMPRESSION: No acute abnormality noted. Aortic Atherosclerosis (ICD10-I70.0). Electronically Signed   By: Alcide CleverMark  Lukens M.D.   On: 08/29/2020 21:39   DG Chest Port 1 View  Result Date: 08/30/2020 CLINICAL DATA:  Status post TAVR EXAM: PORTABLE CHEST 1 VIEW COMPARISON:  08/29/2020 FINDINGS: Cardiac shadow is stable. New TAVR is noted. Aortic calcifications are again seen. Lungs are clear bilaterally. No acute bony abnormality is noted. Stable changes in the shoulder joints are seen. IMPRESSION: TAVR is now seen in place.  No acute abnormality noted. Electronically Signed   By: Alcide CleverMark  Lukens M.D.   On: 08/30/2020 19:17   ECHOCARDIOGRAM LIMITED  Result Date:  08/30/2020    ECHOCARDIOGRAM LIMITED REPORT   Patient Name:   Rennis GoldenCAROL L Firman Date of Exam: 08/30/2020 Medical Rec #:  [id number]       Height:       63.0 in Accession #:    [id number]      Weight:       152.1 lb Date of Birth:  [birth year]       BSA:          1.721 m Patient Age:    86 years        BP:           167/53 mmHg Patient Gender: F               HR:           74 bpm. Exam Location:  Inpatient Procedure: Limited Echo, Cardiac Doppler and Color Doppler Indications:    Aortic stenosis I35.0  History:        Patient has prior history of Echocardiogram examinations, most                 recent 08/20/2020. Risk Factors:Hypertension, Dyslipidemia and                 Non-Smoker.                 Aortic Valve: 23 mm stented (TAVR) valve is present in the                 aortic position. Procedure Date: 08/30/2020.  Sonographer:    Renella Cunas RDCS Referring Phys: [id number] KATHRYN R THOMPSON IMPRESSIONS  1. Left ventricular ejection fraction, by estimation, is 65 to 70%. The left ventricle has normal function.  2. Right ventricular systolic function is normal. The right ventricular size is normal.  3. The mitral valve is degenerative. Mild mitral valve regurgitation. Severe mitral annular calcification.  4. Pre TAVR: poorly visualized by TTE supine in cath lab known to be tri leaflet on CT severe calcification with severe AS mean gradient 28 peak 48 mmHg AVA 0.72 cm2         Post TAVR: well placed 21 mm Medtronic Evolut Pro valve. No PVL. Initially moderate central AR. Pigtail catheter manipulated across valve repeat imaging with mild appearing still central AR mean gradient 6 mmHg peak 9 mmHg AVA 1.8 cm2. There is a 23 mm stented (TAVR) valve present in the aortic position. Procedure Date: 08/30/2020. FINDINGS  Left Ventricle: Left ventricular ejection fraction, by estimation, is 65 to 70%. The left ventricle has normal function. The left ventricular internal cavity size was small. There is no left ventricular  hypertrophy. Right Ventricle: The right ventricular size is normal. No increase in right ventricular wall thickness. Right ventricular systolic function is normal. Pericardium: There is no evidence of pericardial effusion. Mitral Valve: The mitral valve is degenerative in appearance. There is moderate thickening of the mitral valve leaflet(s). There is moderate calcification of the mitral valve leaflet(s). Severe mitral annular calcification. Mild mitral valve regurgitation. Aortic Valve: Pre TAVR: poorly visualized by TTE supine in cath lab known to be tri leaflet on CT severe calcification with severe AS mean gradient 28 peak 48 mmHg AVA 0.72 cm2 Post TAVR: well placed 21 mm Medtronic Evolut Pro valve. No PVL. Initially moderate central AR. Pigtail catheter manipulated across valve repeat imaging with mild appearing still central AR mean gradient 6 mmHg peak 9 mmHg AVA 1.8 cm2. Aortic valve mean gradient measures 19.0 mmHg. Aortic valve peak gradient measures 27.2 mmHg. Aortic valve area, by VTI measures 1.09 cm. There is a 23 mm stented (TAVR) valve present in  the aortic position. Procedure Date: 08/30/2020. LEFT VENTRICLE PLAX 2D LVOT diam:     1.60 cm LV SV:         63 LV SV Index:   37 LVOT Area:     2.01 cm  AORTIC VALVE AV Area (Vmax):    1.03 cm AV Area (Vmean):   1.06 cm AV Area (VTI):     1.09 cm AV Vmax:           261.00 cm/s AV Vmean:          189.000 cm/s AV VTI:            0.578 m AV Peak Grad:      27.2 mmHg AV Mean Grad:      19.0 mmHg LVOT Vmax:         134.00 cm/s LVOT Vmean:        99.700 cm/s LVOT VTI:          0.313 m LVOT/AV VTI ratio: 0.54  SHUNTS Systemic VTI:  0.31 m Systemic Diam: 1.60 cm Charlton Haws MD Electronically signed by Charlton Haws MD Signature Date/Time: 08/30/2020/2:30:40 PM    Final    Structural Heart Procedure  Result Date: 08/30/2020 See surgical note for result.       Scheduled Meds:  amLODipine  5 mg Oral Daily   aspirin EC  81 mg Oral Daily    Chlorhexidine Gluconate Cloth  6 each Topical Daily   clopidogrel  75 mg Oral Q breakfast   escitalopram  10 mg Oral Daily   famotidine  40 mg Oral Daily   gabapentin  100 mg Oral TID   metoprolol succinate  50 mg Oral Daily   mupirocin ointment  1 application Nasal BID   QUEtiapine  25 mg Oral QHS   sodium chloride flush  3 mL Intravenous Q12H   sodium chloride flush  3 mL Intravenous Q12H   sodium chloride flush  3 mL Intravenous Q12H   temazepam  15 mg Oral QHS   Continuous Infusions:  sodium chloride     sodium chloride     sodium chloride     nitroGLYCERIN     phenylephrine (NEO-SYNEPHRINE) Adult infusion       LOS: 12 days    Time spent: 25 mins,More than 50% of that time was spent in counseling and/or coordination of care.      Burnadette Pop, MD [facility] P7/13/2022, 8:31 AM

## 2020-08-31 NOTE — Progress Notes (Signed)
Mobility Specialist: Progress Note   08/31/20 1722  Mobility  Activity  (Stood at Chair)  Level of Assistance Minimal assist, patient does 75% or more  Assistive Device Front wheel walker  Mobility Out of bed to chair with meals  Mobility Response Tolerated well  Mobility performed by Mobility specialist  Bed Position Chair  $Mobility charge 1 Mobility   Pt was able to stand x2 at RW with the first bout lasting roughly 30 seconds and the second bout lasting roughly 45 seconds. Pt c/o 9/10 pain in LLE during session, otherwise asx. Pt back to recliner after session with call bell and phone in reach and family member present in the room.   [facility] Denzil Bristol Mobility Specialist Mobility Specialist Phone: [phone]

## 2020-08-31 NOTE — Progress Notes (Signed)
  Echocardiogram 2D Echocardiogram has been performed.  Terri King 08/31/2020, 11:21 AM

## 2020-08-31 NOTE — Progress Notes (Signed)
Occupational Therapy Treatment Patient Details Name: Terri King MRN: [id number] DOB: [birth year] Today's Date: 08/31/2020    History of present illness 86 year old female who was admitted after being found on the floor by family. patient was noted to have had syncopal episode.  found to have left lateral femoral condyle fracture, ortho was consulted, Dr.Lucey recommending knee immoblizer and touch down weightbearing. CT of head was negative. Patient transferred to [facility] 7/6. MRI + tiny left occipital subcortical infarct, MR angiogram of neck shows small left carotid web in MRA of the brain shows left V4 stenosis. PMH was significant for MI, OA, L TKA, CAD.   OT comments  Pt pleasant and motivated to participate in therapy. Pt noted with purewick malfunction and assistance needed for peri care. Pt overall Mod A x 2 for sit to stand transfer using RW for Total A peri care. Pt noted with difficulty maintaining TDWB precautions so opted for scoot transfer to recliner. Pt required only Min A for scoot transfer with cues for sequencing. Educated pt on trial of [facility] transfer once equipment able to be provided in room. Plan to further address LB ADL compensatory strategies next session as well. Continue to recommend ST rehab at DC. VSS on RA.   Follow Up Recommendations  SNF    Equipment Recommendations  Tub/shower bench;3 in 1 bedside commode;Wheelchair (measurements OT);Wheelchair cushion (measurements OT)    Recommendations for Other Services      Precautions / Restrictions Precautions Precautions: Fall Required Braces or Orthoses: Knee Immobilizer - Left Knee Immobilizer - Left: On at all times Restrictions Weight Bearing Restrictions: Yes LLE Weight Bearing: Touchdown weight bearing       Mobility Bed Mobility Overal bed mobility: Needs Assistance Bed Mobility: Supine to Sit     Supine to sit: Min assist;HOB elevated     General bed mobility comments: Light MIn A to advance L LE to  EOB    Transfers Overall transfer level: Needs assistance Equipment used: Rolling walker (2 wheeled);1 person hand held assist Transfers: Sit to/from Stand;Lateral/Scoot Transfers Sit to Stand: Mod assist;+2 physical assistance;+2 safety/equipment        Lateral/Scoot Transfers: Min assist General transfer comment: Mod A x 2 for sit to stand from bedside with RW, At least Mod A needed to maintain standing balance with frequent cueing for WB precautions. opted for scoot transfer due to pt difficulty with WB precautions with pt able to complete Min A with sequencing cueing    Balance Overall balance assessment: Needs assistance Sitting-balance support: Feet supported;No upper extremity supported Sitting balance-Leahy Scale: Fair     Standing balance support: Bilateral upper extremity supported Standing balance-Leahy Scale: Poor Standing balance comment: relient on UE support + external support                           ADL either performed or assessed with clinical judgement   ADL Overall ADL's : Needs assistance/impaired Eating/Feeding: Independent;Sitting           Lower Body Bathing: Total assistance;+2 for physical assistance;+2 for safety/equipment;Sit to/from stand Lower Body Bathing Details (indicate cue type and reason): Total A x 2 in standing for peri care after purewick malfunction                       General ADL Comments: Frequent cueing needed to maintain WB precautions in standing with +2 assist needed, but pt able to demo  scoot transfers well. Educated on translation of scoot transfers to [facility], as well as lateral leans for LB ADLs. No BSC noted in pt room - RN aware     Vision   Vision Assessment?: No apparent visual deficits   Perception     Praxis      Cognition Arousal/Alertness: Awake/alert Behavior During Therapy: WFL for tasks assessed/performed Overall Cognitive Status: Impaired/Different from baseline Area of Impairment:  Awareness;Problem solving                           Awareness: Emergent Problem Solving: Slow processing;Difficulty sequencing;Requires verbal cues          Exercises     Shoulder Instructions       General Comments VSS on RA    Pertinent Vitals/ Pain       Pain Assessment: Faces Faces Pain Scale: Hurts even more Pain Location: L LE in standing Pain Descriptors / Indicators: Grimacing;Guarding Pain Intervention(s): Monitored during session;Repositioned  Home Living                                          Prior Functioning/Environment              Frequency  Min 2X/week        Progress Toward Goals  OT Goals(current goals can now be found in the care plan section)  Progress towards OT goals: OT to reassess next treatment  Acute Rehab OT Goals Patient Stated Goal: patient wants to be able to get back home at [facility], agreeable to rehab OT Goal Formulation: With patient Time For Goal Achievement: 09/05/20 Potential to Achieve Goals: Good ADL Goals Pt Will Perform Lower Body Dressing: with min assist Pt Will Transfer to Toilet: with min assist;stand pivot transfer;bedside commode Pt Will Perform Toileting - Clothing Manipulation and hygiene: sitting/lateral leans;with supervision  Plan Discharge plan remains appropriate    Co-evaluation                 AM-PAC OT "6 Clicks" Daily Activity     Outcome Measure   Help from another person eating meals?: None Help from another person taking care of personal grooming?: A Little Help from another person toileting, which includes using toliet, bedpan, or urinal?: Total Help from another person bathing (including washing, rinsing, drying)?: A Lot Help from another person to put on and taking off regular upper body clothing?: A Little Help from another person to put on and taking off regular lower body clothing?: A Lot 6 Click Score: 15    End of Session Equipment Utilized  During Treatment: Gait belt;Rolling walker;Left knee immobilizer  OT Visit Diagnosis: Pain;Muscle weakness (generalized) (M62.81) Pain - Right/Left: Left Pain - part of body: Leg   Activity Tolerance Patient tolerated treatment well   Patient Left in chair;with call bell/phone within reach;with chair alarm set   Nurse Communication Mobility status;Other (comment) (no BSC in room)        Time: 6759-1638 OT Time Calculation (min): 28 min  Charges: OT General Charges $OT Visit: 1 Visit OT Treatments $Self Care/Home Management : 8-22 mins $Therapeutic Activity: 8-22 mins  Bradd Canary, OTR/L Acute Rehab Services Office: [phone]    Lorre Munroe 08/31/2020, 12:15 PM

## 2020-08-31 NOTE — Progress Notes (Signed)
Progress Note  Patient Name: Terri King Date of Encounter: 08/31/2020  [facility] Cardiologist: Dietrich Pates, MD   Subjective   No complaints this am. She has ambulated.   Inpatient Medications    Scheduled Meds:  amLODipine  5 mg Oral Daily   aspirin EC  81 mg Oral Daily   Chlorhexidine Gluconate Cloth  6 each Topical Daily   clopidogrel  75 mg Oral Q breakfast   escitalopram  10 mg Oral Daily   famotidine  40 mg Oral Daily   gabapentin  100 mg Oral TID   metoprolol succinate  50 mg Oral Daily   mupirocin ointment  1 application Nasal BID   QUEtiapine  25 mg Oral QHS   sodium chloride flush  3 mL Intravenous Q12H   sodium chloride flush  3 mL Intravenous Q12H   sodium chloride flush  3 mL Intravenous Q12H   temazepam  15 mg Oral QHS   Continuous Infusions:  sodium chloride     sodium chloride     sodium chloride     nitroGLYCERIN     phenylephrine (NEO-SYNEPHRINE) Adult infusion     PRN Meds: sodium chloride, sodium chloride, HYDROcodone-acetaminophen, ibuprofen, menthol-cetylpyridinium, metoprolol tartrate, morphine injection, nitroGLYCERIN, ondansetron (ZOFRAN) IV, senna-docusate, sodium chloride flush, sodium chloride flush   Vital Signs    Vitals:   08/31/20 0000 08/31/20 0400 08/31/20 0411 08/31/20 0700  BP: (!) 125/48 (!) 142/60 (!) 142/60 (!) 74/58  Pulse: 71  79 (!) 38  Resp: (!) 21 (!) 21 20 (!) 24  Temp:   (!) 97.4 F (36.3 C)   TempSrc:   Oral   SpO2: 100%  100% (!) 85%  Weight:    72.4 kg  Height:        Intake/Output Summary (Last 24 hours) at 08/31/2020 0720 Last data filed at 08/31/2020 0400 Gross per 24 hour  Intake 1190 ml  Output 50 ml  Net 1140 ml   Last 3 Weights 08/31/2020 08/30/2020 08/30/2020  Weight (lbs) 159 lb 9.8 oz 152 lb 1.9 oz 152 lb 1.9 oz  Weight (kg) 72.4 kg 69 kg 69 kg      Telemetry    Sinus - Personally Reviewed  ECG    NSR - Personally Reviewed  Physical Exam   GEN: No acute distress.   Neck: No  JVD Cardiac: RRR, no murmurs, rubs, or gallops.  Respiratory: Clear to auscultation bilaterally. GI: Soft, nontender, non-distended  MS: No edema; No deformity. Neuro:  Nonfocal  Psych: Normal affect   Labs    High Sensitivity Troponin:  No results for input(s): TROPONINIHS in the last 720 hours.    Chemistry Recent Labs  Lab 08/29/20 0428 08/30/20 0208 08/30/20 1310 08/30/20 1518 08/31/20 0625  NA 132* 133* 136 138 135  K 3.2* 3.8 3.8 3.8 3.5  CL 101 104 106 106 105  CO2 23 22  --   --  22  GLUCOSE 118* 140* 159* 152* 138*  BUN 12 15 17 16 18   CREATININE 0.86 0.95 0.80 0.70 0.86  CALCIUM 9.3 9.9  --   --  9.7  PROT 6.2*  --   --   --   --   ALBUMIN 3.0*  --   --   --   --   AST 24  --   --   --   --   ALT 32  --   --   --   --   [facility]  92  --   --   --   --   BILITOT 1.1  --   --   --   --   GFRNONAA >60 59*  --   --  >60  ANIONGAP 8 7  --   --  8     Hematology Recent Labs  Lab 08/29/20 0428 08/30/20 0208 08/30/20 1310 08/30/20 1518 08/31/20 0625  WBC 9.8 14.0*  --   --  24.2*  RBC 3.95 3.97  --   --  3.61*  HGB 12.4 12.7 13.3 10.5* 11.4*  HCT 36.1 36.3 39.0 31.0* 33.3*  MCV 91.4 91.4  --   --  92.2  MCH 31.4 32.0  --   --  31.6  MCHC 34.3 35.0  --   --  34.2  RDW 13.2 13.2  --   --  13.3  PLT 277 289  --   --  278    BNPNo results for input(s): BNP, PROBNP in the last 168 hours.   DDimer No results for input(s): DDIMER in the last 168 hours.   Radiology    DG Chest 2 View  Result Date: 08/29/2020 CLINICAL DATA:  Preop evaluation for upcoming TAVR EXAM: CHEST - 2 VIEW COMPARISON:  08/19/2020 FINDINGS: Cardiac shadow is within normal limits. Calcific changes of the thoracic aorta are noted. The lungs are clear bilaterally. No focal infiltrate or effusion is noted. Degenerative changes of the shoulder joints are seen bilaterally. IMPRESSION: No acute abnormality noted. Aortic Atherosclerosis (ICD10-I70.0). Electronically Signed   By: Alcide CleverMark  Lukens M.D.    On: 08/29/2020 21:39   DG Chest Port 1 View  Result Date: 08/30/2020 CLINICAL DATA:  Status post TAVR EXAM: PORTABLE CHEST 1 VIEW COMPARISON:  08/29/2020 FINDINGS: Cardiac shadow is stable. New TAVR is noted. Aortic calcifications are again seen. Lungs are clear bilaterally. No acute bony abnormality is noted. Stable changes in the shoulder joints are seen. IMPRESSION: TAVR is now seen in place.  No acute abnormality noted. Electronically Signed   By: Alcide CleverMark  Lukens M.D.   On: 08/30/2020 19:17   ECHOCARDIOGRAM LIMITED  Result Date: 08/30/2020    ECHOCARDIOGRAM LIMITED REPORT   Patient Name:   Terri King Date of Exam: 08/30/2020 Medical Rec #:  [id number]       Height:       63.0 in Accession #:    [id number]      Weight:       152.1 lb Date of Birth:  08-11-1934       BSA:          1.721 m Patient Age:    86 years        BP:           167/53 mmHg Patient Gender: F               HR:           74 bpm. Exam Location:  Inpatient Procedure: Limited Echo, Cardiac Doppler and Color Doppler Indications:    Aortic stenosis I35.0  History:        Patient has prior history of Echocardiogram examinations, most                 recent 08/20/2020. Risk Factors:Hypertension, Dyslipidemia and                 Non-Smoker.                 Aortic Valve: 23  mm stented (TAVR) valve is present in the                 aortic position. Procedure Date: 08/30/2020.  Sonographer:    Renella Cunas RDCS Referring Phys: [id number] KATHRYN R THOMPSON IMPRESSIONS  1. Left ventricular ejection fraction, by estimation, is 65 to 70%. The left ventricle has normal function.  2. Right ventricular systolic function is normal. The right ventricular size is normal.  3. The mitral valve is degenerative. Mild mitral valve regurgitation. Severe mitral annular calcification.  4. Pre TAVR: poorly visualized by TTE supine in cath lab known to be tri leaflet on CT severe calcification with severe AS mean gradient 28 peak 48 mmHg AVA 0.72 cm2         Post TAVR:  well placed 21 mm Medtronic Evolut Pro valve. No PVL. Initially moderate central AR. Pigtail catheter manipulated across valve repeat imaging with mild appearing still central AR mean gradient 6 mmHg peak 9 mmHg AVA 1.8 cm2. There is a 23 mm stented (TAVR) valve present in the aortic position. Procedure Date: 08/30/2020. FINDINGS  Left Ventricle: Left ventricular ejection fraction, by estimation, is 65 to 70%. The left ventricle has normal function. The left ventricular internal cavity size was small. There is no left ventricular hypertrophy. Right Ventricle: The right ventricular size is normal. No increase in right ventricular wall thickness. Right ventricular systolic function is normal. Pericardium: There is no evidence of pericardial effusion. Mitral Valve: The mitral valve is degenerative in appearance. There is moderate thickening of the mitral valve leaflet(s). There is moderate calcification of the mitral valve leaflet(s). Severe mitral annular calcification. Mild mitral valve regurgitation. Aortic Valve: Pre TAVR: poorly visualized by TTE supine in cath lab known to be tri leaflet on CT severe calcification with severe AS mean gradient 28 peak 48 mmHg AVA 0.72 cm2 Post TAVR: well placed 21 mm Medtronic Evolut Pro valve. No PVL. Initially moderate central AR. Pigtail catheter manipulated across valve repeat imaging with mild appearing still central AR mean gradient 6 mmHg peak 9 mmHg AVA 1.8 cm2. Aortic valve mean gradient measures 19.0 mmHg. Aortic valve peak gradient measures 27.2 mmHg. Aortic valve area, by VTI measures 1.09 cm. There is a 23 mm stented (TAVR) valve present in the aortic position. Procedure Date: 08/30/2020. LEFT VENTRICLE PLAX 2D LVOT diam:     1.60 cm LV SV:         63 LV SV Index:   37 LVOT Area:     2.01 cm  AORTIC VALVE AV Area (Vmax):    1.03 cm AV Area (Vmean):   1.06 cm AV Area (VTI):     1.09 cm AV Vmax:           261.00 cm/s AV Vmean:          189.000 cm/s AV VTI:             0.578 m AV Peak Grad:      27.2 mmHg AV Mean Grad:      19.0 mmHg LVOT Vmax:         134.00 cm/s LVOT Vmean:        99.700 cm/s LVOT VTI:          0.313 m LVOT/AV VTI ratio: 0.54  SHUNTS Systemic VTI:  0.31 m Systemic Diam: 1.60 cm Charlton Haws MD Electronically signed by Charlton Haws MD Signature Date/Time: 08/30/2020/2:30:40 PM    Final    Structural Heart Procedure  Result Date:  08/30/2020 See surgical note for result.   Cardiac Studies     Patient Profile     86 y.o. female admitted with syncope and found to have severe AS.   Assessment & Plan    Severe aortic stenosis. POD#1 s/p TAVR with placement of Medtronic Evolut Pro valve from the femoral approach. Groins soft without hematoma. BP stable. Continue ASA and Plavix. Echo today to assess her valve. If her echo is stable, she could go home from the standpoint of her valve.    For questions or updates, please contact [facility] Please consult [email or web address] for contact info under        Signed, Verne Carrow, MD  08/31/2020, 7:20 AM

## 2020-08-31 NOTE — Progress Notes (Signed)
Reviewed restrictions with pt. No exercise given. Had pt practice IS, 1700 ml at time, encouraged more use while unable to ambulate. Discussed CRPII and will refer to [place]. They will call her in 3 months and see how her progress goes. CR will sign off now. [phone] Ethelda Chick CES, ACSM 3:28 PM 08/31/2020

## 2020-08-31 NOTE — Progress Notes (Signed)
Physical Therapy Treatment Patient Details Name: Terri King MRN: [id number] DOB: 07-09-1934 Today's Date: 08/31/2020    History of Present Illness 86 year old female who was admitted after being found on the floor by family. patient was noted to have had syncopal episode.  found to have left lateral femoral condyle fracture, ortho was consulted, Dr.Lucey recommending knee immoblizer and touch down weightbearing. CT of head was negative. Patient transferred to [facility] 7/6. MRI + tiny left occipital subcortical. Pt underwent TAVR on 7/12.  PMH was significant for MI, OA, L TKA, CAD.    PT Comments    Pt making slow progress with mobility due to limited wt bearing on LLE. Pt very motivated and expect will make steady progress. Likely will be nonambulatory until she is allowed to weight bear on LLE so will need to work on bed to chair transfers to work toward independence at wheelchair level.    Follow Up Recommendations  SNF     Equipment Recommendations  Wheelchair (measurements PT);Wheelchair cushion (measurements PT)    Recommendations for Other Services       Precautions / Restrictions Precautions Precautions: Fall Required Braces or Orthoses: Knee Immobilizer - Left Knee Immobilizer - Left: On at all times Restrictions Weight Bearing Restrictions: Yes LLE Weight Bearing: Touchdown weight bearing    Mobility  Bed Mobility Overal bed mobility: Needs Assistance Bed Mobility: Supine to Sit     Supine to sit: Min assist;HOB elevated     General bed mobility comments: Pt up in chair    Transfers Overall transfer level: Needs assistance Equipment used: Rolling walker (2 wheeled);1 person hand held assist Transfers: Sit to/from Stand Sit to Stand: Mod assist;+2 physical assistance        Lateral/Scoot Transfers: Min assist General transfer comment: Assist to t bring hips up and for balance. Verbal/tactile cues for hand placement. Assist to control descent to chair when  returning to sitting  Ambulation/Gait             General Gait Details: Unable due to TDWB   Stairs             Wheelchair Mobility    Modified Rankin (Stroke Patients Only)       Balance Overall balance assessment: Needs assistance Sitting-balance support: Feet supported;No upper extremity supported Sitting balance-Leahy Scale: Fair     Standing balance support: Bilateral upper extremity supported Standing balance-Leahy Scale: Poor Standing balance comment: walker and min assist for static standing. Stood x 3 for 1-2 minutes. Kept my foot under pts lt foot to monitor wt bearing. With intermittent verbal/tactile cues pt able to maintain TDWB in standing                            Cognition Arousal/Alertness: Awake/alert Behavior During Therapy: WFL for tasks assessed/performed Overall Cognitive Status: Impaired/Different from baseline Area of Impairment: Awareness;Problem solving                           Awareness: Emergent Problem Solving: Slow processing;Difficulty sequencing;Requires verbal cues        Exercises      General Comments General comments (skin integrity, edema, etc.): VSS on RA      Pertinent Vitals/Pain Pain Assessment: Faces Faces Pain Scale: Hurts even more Pain Location: L LE in standing Pain Descriptors / Indicators: Grimacing;Guarding Pain Intervention(s): Monitored during session    Home Living  Prior Function            PT Goals (current goals can now be found in the care plan section) Acute Rehab PT Goals Patient Stated Goal: get back to prior level Progress towards PT goals: Progressing toward goals    Frequency    Min 3X/week      PT Plan Current plan remains appropriate    Co-evaluation              AM-PAC PT "6 Clicks" Mobility   Outcome Measure  Help needed turning from your back to your side while in a flat bed without using bedrails?: A  Little Help needed moving from lying on your back to sitting on the side of a flat bed without using bedrails?: A Little Help needed moving to and from a bed to a chair (including a wheelchair)?: A Lot Help needed standing up from a chair using your arms (e.g., wheelchair or bedside chair)?: Total Help needed to walk in hospital room?: Total Help needed climbing 3-5 steps with a railing? : Total 6 Click Score: 11    End of Session Equipment Utilized During Treatment: Gait belt;Left knee immobilizer Activity Tolerance: Patient tolerated treatment well Patient left: in chair;with call bell/phone within reach;with chair alarm set Nurse Communication: Mobility status PT Visit Diagnosis: Pain;Muscle weakness (generalized) (M62.81);History of falling (Z91.81) Pain - Right/Left: Left Pain - part of body: Knee     Time: 1430-1451 PT Time Calculation (min) (ACUTE ONLY): 21 min  Charges:  $Therapeutic Activity: 8-22 mins                     [facility] PT Acute Rehabilitation Services Pager [phone] Office [phone]    [facility] 08/31/2020, 3:22 PM

## 2020-08-31 NOTE — Anesthesia Postprocedure Evaluation (Signed)
Anesthesia Post Note  Patient: Terri King  Procedure(s) Performed: TRANSCATHETER AORTIC VALVE REPLACEMENT, TRANSFEMORAL TRANSESOPHAGEAL ECHOCARDIOGRAM (TEE)     Patient location during evaluation: PACU Anesthesia Type: MAC Level of consciousness: awake and alert Pain management: pain level controlled Vital Signs Assessment: post-procedure vital signs reviewed and stable Respiratory status: spontaneous breathing, nonlabored ventilation, respiratory function stable and patient connected to nasal cannula oxygen Cardiovascular status: stable and blood pressure returned to baseline Postop Assessment: no apparent nausea or vomiting Anesthetic complications: no   No notable events documented.  Last Vitals:  Vitals:   08/31/20 0700 08/31/20 0741  BP: (!) 74/58 127/70  Pulse: (!) 38 71  Resp: (!) 24 20  Temp:  36.6 C  SpO2: (!) 85% 100%    Last Pain:  Vitals:   08/31/20 0741  TempSrc: Oral  PainSc:                  Ecko Beasley S

## 2020-09-01 LAB — CBC WITH DIFFERENTIAL/PLATELET
Abs Immature Granulocytes: 0.15 10*3/uL — ABNORMAL HIGH (ref 0.00–0.07)
Basophils Absolute: 0 10*3/uL (ref 0.0–0.1)
Basophils Relative: 0 %
Eosinophils Absolute: 0 10*3/uL (ref 0.0–0.5)
Eosinophils Relative: 0 %
HCT: 32 % — ABNORMAL LOW (ref 36.0–46.0)
Hemoglobin: 11.1 g/dL — ABNORMAL LOW (ref 12.0–15.0)
Immature Granulocytes: 1 %
Lymphocytes Relative: 15 %
Lymphs Abs: 1.9 10*3/uL (ref 0.7–4.0)
MCH: 32.4 pg (ref 26.0–34.0)
MCHC: 34.7 g/dL (ref 30.0–36.0)
MCV: 93.3 fL (ref 80.0–100.0)
Monocytes Absolute: 1.5 10*3/uL — ABNORMAL HIGH (ref 0.1–1.0)
Monocytes Relative: 12 %
Neutro Abs: 9.3 10*3/uL — ABNORMAL HIGH (ref 1.7–7.7)
Neutrophils Relative %: 72 %
Platelets: 224 10*3/uL (ref 150–400)
RBC: 3.43 MIL/uL — ABNORMAL LOW (ref 3.87–5.11)
RDW: 13.5 % (ref 11.5–15.5)
WBC: 12.9 10*3/uL — ABNORMAL HIGH (ref 4.0–10.5)
nRBC: 0 % (ref 0.0–0.2)

## 2020-09-01 LAB — URINE CULTURE: Culture: >=100000 — AB

## 2020-09-01 LAB — SARS CORONAVIRUS 2 (TAT 6-24 HRS): SARS Coronavirus 2: NEGATIVE

## 2020-09-01 MED ORDER — TEMAZEPAM 15 MG PO CAPS: 15.0000 mg | ORAL_CAPSULE | Freq: Every day | ORAL | 0 refills | Status: DC

## 2020-09-01 MED ORDER — ASPIRIN 81 MG PO TBEC: 81.0000 mg | DELAYED_RELEASE_TABLET | Freq: Every day | ORAL | 11 refills | Status: DC

## 2020-09-01 MED ORDER — QUETIAPINE FUMARATE 25 MG PO TABS: 25.0000 mg | ORAL_TABLET | Freq: Every day | ORAL | Status: DC

## 2020-09-01 MED ORDER — CLOPIDOGREL BISULFATE 75 MG PO TABS: 75.0000 mg | ORAL_TABLET | Freq: Every day | ORAL | Status: DC

## 2020-09-01 MED ORDER — CEPHALEXIN 500 MG PO CAPS: 500.0000 mg | ORAL_CAPSULE | Freq: Three times a day (TID) | ORAL | 0 refills | Status: AC

## 2020-09-01 MED ORDER — CEPHALEXIN 500 MG PO CAPS
500.0000 mg | ORAL_CAPSULE | Freq: Three times a day (TID) | ORAL | Status: DC
  Administered 2020-09-01 – 2020-09-02 (×5): 500 mg via ORAL
  Filled 2020-09-01 (×5): qty 1

## 2020-09-01 MED ORDER — HYDROCODONE-ACETAMINOPHEN 5-325 MG PO TABS: 1.0000 | ORAL_TABLET | Freq: Four times a day (QID) | ORAL | 0 refills | Status: DC | PRN

## 2020-09-01 NOTE — Progress Notes (Signed)
   Echo yesterday reviewed post-TAVR - shows well-placed Evolut Pro valve with mean gradient of 11 mmHg, no perivalvular leak, normal LV function. Follow-up with structural heart service has been arranged on 7/27 with Carlean Jews, PA-C. ?awaiting placement for DC.  Chrystie Nose, MD, [facility], FACP  [place]  [facility]  Medical Director of the Advanced Lipid Disorders &  Cardiovascular Risk Reduction Clinic Diplomate of the American Board of Clinical Lipidology Attending Cardiologist  Direct Dial: [phone]  Fax: [phone]  Website:  [email or web address]

## 2020-09-01 NOTE — Progress Notes (Signed)
Mobility Specialist: Progress Note   09/01/20 1726  Mobility  Activity Refused mobility   Pt refused mobility saying she is in too much pain today. Despite encouragement pt still refused. Will f/u as able.   [facility] Merri Dimaano Mobility Specialist Mobility Specialist Phone: [phone]

## 2020-09-01 NOTE — TOC Progression Note (Signed)
Transition of Care [facility]) - Progression Note    Patient Details  Name: Terri King MRN: [id number] Date of Birth: 07/24/1934  [facility]) CM/SW Contact  Eduard Roux, [place] Phone Number: 09/01/2020, 1:07 PM  Clinical Narrative:     [facility]- still waiting on insurance authorization and covid test results  Antony Blackbird, MSW, LCSW Clinical Social Worker         Expected Discharge Plan and Services           Expected Discharge Date: 09/01/20                                     Social Determinants of Health (SDOH) Interventions    Readmission Risk Interventions No flowsheet data found.

## 2020-09-01 NOTE — Discharge Summary (Addendum)
Physician Discharge Summary  Terri King GDJ:[id number] DOB: Dec 07, 1934 DOA: 08/19/2020  PCP: Cassandria Anger, MD  Admit date: 08/19/2020 Discharge date: 09/02/20  Admitted From: Home Disposition:  SNF  Discharge Condition:Stable CODE STATUS:FULL Diet recommendation: Heart Healthy  Brief/Interim Summary: Terri King is a 86 y.o. female with medical history significant for HTN, moderate AS (per 03/2019 echo), CAD, hx of MI, CAD, OA who presented after being found on the floor at home by family. She reported she passed out and laid on the floor overnight. X-rays revealed a left lateral condyle fracture of the femur. She was evaluated by orthopedic surgery and recommended for a knee immobilizer, touchdown weightbearing, and to follow-up outpatient with [facility] in approximately 2 weeks. Further work-up was commenced regarding her syncope, falling, lethargy/weakness, and confusion as per family. She was found to be on multiple over sedating medications including temazepam, gabapentin, Seroquel. Medications have been weaned down/off over the past few days.  Repeat echo was also ordered and revealed progression of her aortic stenosis, now graded as severe.  MRI brain also obtained which showed a 5 mm acute cortical infarct within the left occipital lobe and tiny chronic lacunar infarcts within the left cerebellar hemisphere. Echo has shown severe aortic stenosis.  She was evaluated by cardiology at [facility] - having been transferred to [facility] for further evaluation and workup.Underwent TAVR on 08/30/20.  She is medically stable for discharge to skilled nursing facility  Following problems were addressed during her hospitalization:  Severe AS - Progression from 03/2019 echo, now with severe AS; has a syncopal episode prior to admission - R/L heart cath 7/6 -unremarkable for significant CAD per report -S/P TAVR on 08/30/20 -Follow up with cardiology as an outpatient.   Transient  altered mental status - resolved; - At this point, may be multifactorial in setting of polypharmacy (temazepam, gabapentin, Seroquel), recent CVA - EEG unremarkable - Continue to wean CNS depressants as tolerated.  So far temazepam from 30 mg qhs down to 15 mg qhs, reduced gaba from 300 mg TID to 100 mg TID, and reduced seroquel from BID to 25 mg qhs - tolerated wean so far without incident   Fracture of the left femoral condyle, closed - Status post orthopedics consultation recommended knee immobilizer and follow-up in the office in 2 weeks. -Continue pain meds and supportive care   Acute CVA - Found to have acute 5 mm left occipital infarct seen on MRI brain; also chronic infarcts in left cerebellum; less likely to be cardioembolic given unilateral distribution - Neurology were following -MRA shows small left carotid web with V4 stenosis - TSH (1.542), A1c (5.2 %), Lipid (LDL 83) -Neurology recommended conservative management for vertebral artery stenosis -Currently on aspirin and plavix   Essential hypertension - Continue amlodipine,lopressor -Monitor Bp   Leukocytosis Improving.  Urine culture grew staph [place]ensis.  Started on 3 days course of Keflex.  Denies any dysuria  debility/deconditioning Patient seen by PT/OT and recommended skilled nursing facility on discharge.           Discharge Diagnoses:  Principal Problem:   Syncope Active Problems:   HYPOKALEMIA   Essential hypertension   Severe aortic stenosis   Fracture of femoral condyle, closed (HCC)   Acute CVA (cerebrovascular accident) ([place])   S/P TAVR (transcatheter aortic valve replacement)    Discharge Instructions  Discharge Instructions     Amb Referral to Cardiac Rehabilitation   Complete by: As directed    Diagnosis: Valve Replacement  Valve: Aortic Comment - TAVR   After initial evaluation and assessments completed: Virtual Based Care may be provided alone or in conjunction with Phase 2  Cardiac Rehab based on patient barriers.: Yes   Ambulatory referral to Neurology   Complete by: As directed    An appointment is requested in approximately: 2 weeks   Diet - low sodium heart healthy   Complete by: As directed    Discharge instructions   Complete by: As directed    1)Please take prescribed medications as instructed 2)Follow up with orthopedics in 2 weeks.  Name and number the provider group has been attached 3)You will be called by cardiology for follow-up appointment 4)Follow up with neurology in 2 weeks.  Name and number the provider group has been attached.   Increase activity slowly   Complete by: As directed    No wound care   Complete by: As directed       Allergies as of 09/02/2020       Reactions   Codeine Other (See Comments)   Abnormal behavior   Crestor [rosuvastatin]    Myalgias   Dexilant [dexlansoprazole]    Made me sick   Ibuprofen Nausea And Vomiting   Simvastatin    REACTION: achy   Tylenol [acetaminophen]    Iodine Swelling, Rash        Medication List     STOP taking these medications    triamterene-hydrochlorothiazide 37.5-25 MG tablet Commonly known as: MAXZIDE-25       TAKE these medications    amLODipine 5 MG tablet Commonly known as: NORVASC TAKE 1 TABLET EVERY DAY   aspirin 81 MG EC tablet Take 1 tablet (81 mg total) by mouth daily. Swallow whole.   B-D SINGLE USE SWABS REGULAR Pads 1 each by Does not apply route as directed. Dx: R73.9   cephALEXin 500 MG capsule Commonly known as: KEFLEX Take 1 capsule (500 mg total) by mouth every 8 (eight) hours for 3 days.   cholecalciferol 10 MCG (400 UNIT) Tabs tablet Commonly known as: VITAMIN D3 Take 400 Units by mouth daily.   clopidogrel 75 MG tablet Commonly known as: PLAVIX Take 1 tablet (75 mg total) by mouth daily with breakfast.   escitalopram 10 MG tablet Commonly known as: LEXAPRO Take 1 tablet (10 mg total) by mouth daily.   gabapentin 100 MG  capsule Commonly known as: NEURONTIN TAKE 1 CAPSULE THREE TIMES DAILY   HYDROcodone-acetaminophen 5-325 MG tablet Commonly known as: NORCO/VICODIN Take 1 tablet by mouth every 6 (six) hours as needed for moderate pain or severe pain.   ibuprofen 600 MG tablet Commonly known as: IBU TAKE 1 TABLET EVERY DAY AS NEEDED FOR MODERATE PAIN   Maximum Red Krill 300 MG Caps 1 po qd   metoprolol succinate 50 MG 24 hr tablet Commonly known as: TOPROL-XL TAKE 1 TABLET (50 MG TOTAL) BY MOUTH AT BEDTIME / WITH OR IMMEDIATELY FOLLOWING A MEAL AS DIRECTED   MULTIPLE VITAMINS/WOMENS PO Take by mouth daily.   nitroGLYCERIN 0.4 MG SL tablet Commonly known as: NITROSTAT Place 1 tablet (0.4 mg total) under the tongue every 5 (five) minutes as needed for chest pain.   potassium chloride SA 20 MEQ tablet Commonly known as: KLOR-CON Take 20 mEq by mouth daily.   QUEtiapine 25 MG tablet Commonly known as: SEROQUEL Take 1 tablet (25 mg total) by mouth at bedtime.   Repatha SureClick 962 MG/ML Soaj Generic drug: Evolocumab INJECT 1 PEN INTO THE SKIN  EVERY 14 DAYS   temazepam 15 MG capsule Commonly known as: RESTORIL Take 1 capsule (15 mg total) by mouth at bedtime. What changed:  medication strength how much to take how to take this when to take this additional instructions   True Metrix Blood Glucose Test test strip Generic drug: glucose blood Use to check blood sugars twice a day   True Metrix Level 1 Low Soln 1 each by In Vitro route as directed. Dx: R73.9   True Metrix Meter w/Device Kit USE AS DIRECTED   TRUEplus Lancets 33G Misc Use to check blood sugars twice a day   valACYclovir 1000 MG tablet Commonly known as: VALTREX Take 1 tablet (1,000 mg total) by mouth 3 (three) times daily. What changed:  when to take this reasons to take this   vitamin B-12 100 MCG tablet Commonly known as: CYANOCOBALAMIN Take 100 mcg by mouth daily.   VITAMIN C PO Take 1 tablet by mouth  daily.   Vitamin E 400 units Tabs Take by mouth daily.       ASK your doctor about these medications    famotidine 40 MG tablet Commonly known as: PEPCID TAKE 1 TABLET (40 MG TOTAL) BY MOUTH DAILY. ANNUAL APPT DUE IN JUNE MUST SEE PROVIDER FOR FUTURE REFILLS        Follow-up Information     [facility]. Schedule an appointment as soon as possible for a visit in 2 week(s).   Specialty: Orthopedic Surgery Contact information: [facility] [place] [address] [phone]         [facility]. Schedule an appointment as soon as possible for a visit in 2 week(s).   Specialty: Neurology Contact information: [address] [place] [phone]               Allergies  Allergen Reactions   Codeine Other (See Comments)    Abnormal behavior   Crestor [Rosuvastatin]     Myalgias    Dexilant [Dexlansoprazole]     Made me sick   Ibuprofen Nausea And Vomiting   Simvastatin     REACTION: achy   Tylenol [Acetaminophen]    Iodine Swelling and Rash    Consultations: Cardiology, neurology   Procedures/Studies: DG Chest 1 View  Result Date: 08/19/2020 CLINICAL DATA:  Syncope, fall yesterday. Right shoulder pain. Left hip pain. Knee pain. EXAM: CHEST  1 VIEW COMPARISON:  Radiograph 09/02/2015. Shoulder radiograph reported separately. FINDINGS: Stable heart size and mediastinal contours. Aortic atherosclerosis. Mitral annulus calcifications. No pneumothorax or focal airspace disease. No pleural effusion. Remote left rib fractures. No acute rib fractures are seen. IMPRESSION: 1. No acute finding. 2. Remote left rib fractures. Electronically Signed   By: Keith Rake M.D.   On: 08/19/2020 18:51   DG Chest 2 View  Result Date: 08/29/2020 CLINICAL DATA:  Preop evaluation for upcoming TAVR EXAM: CHEST - 2 VIEW COMPARISON:  08/19/2020 FINDINGS:  Cardiac shadow is within normal limits. Calcific changes of the thoracic aorta are noted. The lungs are clear bilaterally. No focal infiltrate or effusion is noted. Degenerative changes of the shoulder joints are seen bilaterally. IMPRESSION: No acute abnormality noted. Aortic Atherosclerosis (ICD10-I70.0). Electronically Signed   By: Inez Catalina M.D.   On: 08/29/2020 21:39   DG Shoulder Right  Result Date: 08/19/2020 CLINICAL DATA:  Syncope, fall yesterday.  Right shoulder pain. EXAM: RIGHT SHOULDER - 2+ VIEW COMPARISON:  None. FINDINGS: No  evidence of acute fracture or dislocation. There is advanced glenohumeral osteoarthritis with near complete joint space loss, osteophytes which are likely fragmented, and subchondral cystic change and sclerosis. 15 mm ossified intra-articular body. Soft tissue calcifications adjacent to the lateral humeral head may represent ossified intra-articular bodies or enthesopathic. Subcortical cystic change in the lateral humeral head suggestive of rotator cuff arthropathy. There is mild to moderate acromioclavicular degenerative change. IMPRESSION: 1. No acute fracture or dislocation of the right shoulder. 2. Advanced glenohumeral osteoarthritis with at least one 15 mm ossified intra-articular body. Additional rounded calcifications adjacent to the lateral humeral head may be additional ossified bodies or within the rotator cuff tendons. 3. Subcortical cystic change in the lateral humeral head suggestive of rotator cuff arthropathy. Electronically Signed   By: Keith Rake M.D.   On: 08/19/2020 18:52   CT Head Wo Contrast  Result Date: 08/19/2020 CLINICAL DATA:  Head trauma, mod-severe Fall yesterday. EXAM: CT HEAD WITHOUT CONTRAST TECHNIQUE: Contiguous axial images were obtained from the base of the skull through the vertex without intravenous contrast. COMPARISON:  Head CT 05/13/2015 FINDINGS: Brain: No intracranial hemorrhage, mass effect, or midline shift. Slight  progression in generalized atrophy and chronic small vessel ischemia from 2017. No hydrocephalus. The basilar cisterns are patent. No evidence of territorial infarct or acute ischemia. No extra-axial or intracranial fluid collection. Vascular: Atherosclerosis of skullbase vasculature without hyperdense vessel or abnormal calcification. Skull: No fracture or focal lesion. Sinuses/Orbits: Paranasal sinuses and mastoid air cells are clear. The visualized orbits are unremarkable. Postsurgical change in both globes. Other: None. IMPRESSION: 1. No acute intracranial abnormality. No skull fracture. 2. Atrophy and chronic small vessel ischemia, with slight progression since 2017. Electronically Signed   By: Keith Rake M.D.   On: 08/19/2020 18:47   CT Cervical Spine Wo Contrast  Result Date: 08/19/2020 CLINICAL DATA:  Neck trauma (Age >= 65y) neck paina fter neck trauma Fall yesterday EXAM: CT CERVICAL SPINE WITHOUT CONTRAST TECHNIQUE: Multidetector CT imaging of the cervical spine was performed without intravenous contrast. Multiplanar CT image reconstructions were also generated. COMPARISON:  Cervical radiographs 3107 FINDINGS: Alignment: No traumatic subluxation. Trace anterolisthesis of C3 on C4 appears facet mediated. Skull base and vertebrae: No acute fracture. Vertebral body heights are maintained. The dens and skull base are intact. Soft tissues and spinal canal: No prevertebral fluid or swelling. No visible canal hematoma. Disc levels: Disc space narrowing and endplate spurring at M7-W8. There is multilevel facet hypertrophy, most prominently affecting C3-C4 on the right and C5-C6 on the left. Upper chest: No acute or unexpected findings. Other: Carotid calcifications. IMPRESSION: Degenerative change in the cervical spine without acute fracture or subluxation. Electronically Signed   By: Keith Rake M.D.   On: 08/19/2020 18:49   MR ANGIO HEAD WO CONTRAST  Result Date: 08/25/2020 CLINICAL DATA:   Stroke follow-up EXAM: MRA HEAD WITHOUT CONTRAST TECHNIQUE: Angiographic images of the Circle of Willis were acquired using MRA technique without intravenous contrast. COMPARISON:  No pertinent prior exam. FINDINGS: POSTERIOR CIRCULATION: --Vertebral arteries: Normal --Inferior cerebellar arteries: Normal. --Basilar artery: Normal. --Superior cerebellar arteries: Normal. --Posterior cerebral arteries: Normal. ANTERIOR CIRCULATION: --Intracranial internal carotid arteries: Normal. --Anterior cerebral arteries (ACA): Normal. --Middle cerebral arteries (MCA): Normal. ANATOMIC VARIANTS: None Other: None. IMPRESSION: Normal intracranial MRA. Electronically Signed   By: Ulyses Jarred M.D.   On: 08/25/2020 02:42   MR ANGIO NECK W WO CONTRAST  Result Date: 08/25/2020 CLINICAL DATA:  Stroke follow-up EXAM: MRA NECK WITHOUT AND WITH  CONTRAST TECHNIQUE: Multiplanar and multiecho pulse sequences of the neck were obtained without and with intravenous contrast. Angiographic images of the neck were obtained using MRA technique without and with intravenous contrast. CONTRAST:  20m GADAVIST GADOBUTROL 1 MMOL/ML IV SOLN COMPARISON:  None. FINDINGS: Right carotid system is normal. 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 Vertebral arteries are right. There is moderate to severe stenosis of the left V4 segment. IMPRESSION: 1. 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 2. Moderate to severe stenosis of the left V4 segment. Electronically Signed   By: KUlyses JarredM.D.   On: 08/25/2020 02:48   MR BRAIN W WO CONTRAST  Result Date: 08/23/2020 CLINICAL DATA:  Neuro deficit, acute, stroke suspected. Additional history provided: Evaluate for possible CVA, recent fall, found down, patient with continued slow Nissen weakness on left side. EXAM: MRI HEAD WITHOUT AND WITH CONTRAST TECHNIQUE: Multiplanar, multiecho pulse sequences of the brain and surrounding structures were obtained without and with intravenous contrast.  CONTRAST:  72mGADAVIST GADOBUTROL 1 MMOL/ML IV SOLN COMPARISON:  Prior head CT examinations 08/19/2020 and earlier. FINDINGS: Brain: The examination is intermittently motion degraded, limiting evaluation. Most notably, there is moderate/severe motion degradation of the coronal T2 weighted sequence, moderate/severe motion degradation of the axial T1 weighted postcontrast sequence, severe motion degradation of the coronal T1 weighted postcontrast sequence and moderate motion degradation of the sagittal T1 weighted postcontrast sequence. Moderate generalized cerebral and cerebellar atrophy. 5 mm focus of restricted diffusion within the left occipital lobe compatible with acute cortical infarct (series 5, image 72). Additional subtle acute cortical infarction changes are questioned more laterally within the left occipital lobe (versus artifact) (for instance as seen on series 5, image 73). Moderate multifocal T2/FLAIR hyperintensity within the cerebral white matter, nonspecific but compatible with chronic small vessel ischemic disease. Tiny chronic lacunar infarcts within the left cerebellar hemisphere. No evidence of an intracranial mass. No chronic intracranial blood products are identified. No extra-axial fluid collection. No midline shift. Within described limitations, no pathologic intracranial enhancement is identified. Vascular: Expected proximal arterial flow voids. Skull and upper cervical spine: No focal marrow lesion. Incompletely assessed cervical spondylosis. Sinuses/Orbits: Visualized orbits show no acute finding. No significant paranasal sinus disease. IMPRESSION: Motion degraded examination, as described and limiting evaluation. 5 mm acute cortical infarct within the left occipital lobe. Additional subtle acute cortical infarction changes are questioned more laterally within the left occipital lobe (versus artifact). Moderate cerebral white matter chronic small vessel ischemic disease. Tiny chronic  lacunar infarcts within the left cerebellar hemisphere. Moderate generalized parenchymal atrophy. Electronically Signed   By: KyKellie SimmeringO   On: 08/23/2020 13:10   CARDIAC CATHETERIZATION  Result Date: 08/24/2020  Normal Right Heart Cath Pressures-PAP 37/8 mmHg with a mean of 22 mmHg.  Prox RCA lesion is 20% stenosed.-Otherwise minimal if any CAD with very tortuous vessels.  SUMMARY  Documented essentially severe aortic stenosis on echocardiogram.  Aortic valve not crossed.  Angiographically normal coronary arteries but very tortuous consistent with hypertensive heart disease  Severe systemic hypertension with pressures ranging from 170 to 20790mHg systolic.  Normal Right Heart Cath Pressures As Well As Cardiac Output and Index RECOMMENDATIONS  Continue evaluation for syncope, if considered to be related to aortic stenosis, would likely need to wait until leg healed.  Need more aggressive blood pressure management. DaGlenetta HewMD  CT [facility]/CTA COR W/SCORE W/Lewanda Rife/CM &/OR WO/CM  Addendum Date: 08/26/2020   ADDENDUM REPORT: 08/26/2020 08:10  ADDENDUM: Please see separate dictation for contemporaneously obtained CTA chest, abdomen and pelvis dated 08/26/2020 for full description of relevant extracardiac findings. Electronically Signed   By: Vinnie Langton M.D.   On: 08/26/2020 08:10   Result Date: 08/26/2020 CLINICAL DATA:  Severe Aortic Stenosis. EXAM: Cardiac TAVR CT TECHNIQUE: A non-contrast, gated CT scan was obtained with axial slices of 3 mm through the heart for aortic valve calcium scoring. A 120 kV retrospective, gated, contrast cardiac scan was obtained. Gantry rotation speed was 250 msecs and collimation was 0.6 mm. Nitroglycerin was not given. The 3D data set was reconstructed in 5% intervals of the 0-95% of the R-R cycle. Systolic and diastolic phases were analyzed on a dedicated workstation using MPR, MIP, and VRT modes. The patient received 100 cc of contrast. FINDINGS:  Image quality: Good. There is cardiac motion artifact likely related to irregular rhythm versus ectopic beat. Noise artifact is: Limited. Valve Morphology: The aortic valve is tricuspid. There are severe diffuse calcifications. The [place] contains bulky calcification. The leaflets demonstrate severely reduced movement in systole. Aortic Valve Calcium score: 1457 Aortic annular dimension: Phase assessed: 35% Annular area: 289 mm2 Annular perimeter: 61.2 mm Max diameter: 21.1 mm Min diameter: 17.8 mm Annular and subannular calcification: None. There is LVOT calcification that is mild that extends under the [place] to the anterior mitral valve leaflet. Optimal coplanar projection: LAO 10 CAU 9 Coronary Artery Height above Annulus: Left Main: 12.8 mm Right Coronary: 18.3 mm Sinus of Valsalva Measurements: Non-coronary: 27 mm Right-coronary: 26 mm Left-coronary: 25 mm Average diameter: 25.8 mm Sinus of Valsalva Height: Non-coronary: 18.7 mm Right-coronary: 20.6 mm Left-coronary: 18.8 mm Sinotubular Junction: 26 mm with mild calcification Ascending Thoracic Aorta: 31 mm Coronary Arteries: Normal coronary origin. Right dominance. The study was performed without use of NTG and is insufficient for plaque evaluation. Please refer to recent cardiac catheterization for coronary assessment. Mild coronary calcifications noted. Cardiac Morphology: Right Atrium: Right atrial size is dilated. Contrast reflux into the IVC consistent with elevated right atrial pressure. Right Ventricle: The right ventricular cavity is within normal limits. Left Atrium: Left atrial size is dilated in size with no left atrial appendage filling defect. Left Ventricle: The ventricular cavity size is within normal limits. There are no stigmata of prior infarction. There is no abnormal filling defect. LV function is hyperdynamic, LVEF=87%. No regional wall motion abnormalities. Pulmonary arteries: Dilated suggestive of pulmonary hypertension. No proximal filling  defect. Pulmonary veins: Normal pulmonary venous drainage. Pericardium: Normal thickness with no significant effusion or calcium present. Mitral Valve: The mitral valve is degenerative with moderate to severe annular calcification. Extra-cardiac findings: See attached radiology report for non-cardiac structures. IMPRESSION: 1. Tricuspid aortic valve with severe aortic stenosis (calcium score 1457). 2. Small annular measurements (292 mm2). Sinus measurements and heights support a 23 mm Evolut Pro TAVR. 3. No significant annular calcifications noted. There is mild LVOT calcification under the [place] that extends to the anterior mitral valve. 4.  Sufficient coronary to annulus distance. 5.  Optimal Fluoroscopic Angle for Delivery: LAO 10 CAU 9 6.  Dilated pulmonary artery suggestive of pulmonary hypertension. 7.  Moderate to severe mitral annular calcification noted. [place] T. Audie Box, MD Electronically Signed: By: Eleonore Chiquito On: 08/25/2020 20:58   DG Chest Port 1 View  Result Date: 08/30/2020 CLINICAL DATA:  Status post TAVR EXAM: PORTABLE CHEST 1 VIEW COMPARISON:  08/29/2020 FINDINGS: Cardiac shadow is stable. New TAVR is noted. Aortic calcifications are again seen. Lungs are clear  bilaterally. No acute bony abnormality is noted. Stable changes in the shoulder joints are seen. IMPRESSION: TAVR is now seen in place.  No acute abnormality noted. Electronically Signed   By: Inez Catalina M.D.   On: 08/30/2020 19:17   DG Shoulder Left  Result Date: 08/22/2020 CLINICAL DATA:  Recent fall. LEFT shoulder pain. Limited range of motion in the LEFT shoulder. EXAM: LEFT SHOULDER - 2+ VIEW COMPARISON:  Chest x-ray on 08/19/2020 FINDINGS: There is deformity of the LEFT humeral head, associated multiple osteophytes and subacromial narrowing. There is no acute fracture or evidence for dislocation. Remote LEFT rib fractures are noted. IMPRESSION: No acute fracture or dislocation.  Chronic LEFT shoulder changes. Remote LEFT rib  fractures. Electronically Signed   By: Nolon Nations M.D.   On: 08/22/2020 16:19   DG Knee Complete 4 Views Left  Result Date: 08/19/2020 CLINICAL DATA:  Syncope, fall, left knee pain. EXAM: LEFT KNEE - COMPLETE 4+ VIEW COMPARISON:  Radiograph 01/23/2012 FINDINGS: Left knee arthroplasty in place. Acute fracture adjacent to the lateral femoral condyle which is likely periprosthetic. Intact tibial component. There has been patellar resurfacing. Moderate knee joint effusion. The bones are diffusely under mineralized. IMPRESSION: 1. Left knee arthroplasty in place. Acute fracture of the lateral femoral condyle, likely periprosthetic. 2. Moderate knee joint effusion. Electronically Signed   By: Keith Rake M.D.   On: 08/19/2020 18:55   EEG adult  Result Date: 08/25/2020 Lora Havens, MD     08/25/2020 11:40 AM Patient Name: MAISHA BOGEN MRN: [id number] Epilepsy Attending: Lora [place] Referring Physician/Provider: Dr. Kathrynn Speed Date: 08/25/2020 Duration: 22.29 minutes Patient history: 86 y.o. female who presented to the ED for evaluation of syncope.  EEG to evaluate for seizures. Level of alertness: Awake AEDs during EEG study: Gabapentin Technical aspects: This EEG study was done with scalp electrodes positioned according to the 10-20 International system of electrode placement. Electrical activity was acquired at a sampling rate of '500Hz'  and reviewed with a high frequency filter of '70Hz'  and a low frequency filter of '1Hz' . EEG data were recorded continuously and digitally stored. Description: The posterior dominant rhythm consists of 7.'5Hz'  activity of moderate voltage (25-35 uV) seen predominantly in posterior head regions, symmetric and reactive to eye opening and eye closing. Hyperventilation and photic stimulation were not performed.   IMPRESSION: This study is within normal limits. No seizures or epileptiform discharges were seen throughout the recording. Priyanka Barbra Sarks   CT ANGIO  CHEST AORTA W/CM & OR WO/CM  Result Date: 08/26/2020 CLINICAL DATA:  86 year old female with history of severe aortic stenosis. Preprocedural study prior to potential transcatheter aortic valve replacement (TAVR) procedure. EXAM: CT ANGIOGRAPHY CHEST, ABDOMEN AND PELVIS TECHNIQUE: Multidetector CT imaging through the chest, abdomen and pelvis was performed using the standard protocol during bolus administration of intravenous contrast. Multiplanar reconstructed images and MIPs were obtained and reviewed to evaluate the vascular anatomy. CONTRAST:  3m OMNIPAQUE IOHEXOL 350 MG/ML SOLN COMPARISON:  CT the chest 04/29/2015. FINDINGS: CTA CHEST FINDINGS Cardiovascular: Heart size is normal. There is no significant pericardial fluid, thickening or pericardial calcification. There is aortic atherosclerosis, as well as atherosclerosis of the great vessels of the mediastinum and the coronary arteries, including calcified atherosclerotic plaque in the left anterior descending coronary artery. Thickening calcification of the aortic valve. Severe calcifications of the mitral annulus. Mediastinum/Lymph Nodes: No pathologically enlarged mediastinal or hilar lymph nodes. Esophagus is unremarkable in appearance. No axillary lymphadenopathy. Lungs/Pleura: No acute consolidative airspace  disease. No pleural effusions. Linear areas of scarring or subsegmental atelectasis in the lower lobes of the lungs bilaterally. No suspicious appearing pulmonary nodules or masses are noted. Musculoskeletal/Soft Tissues: There are no aggressive appearing lytic or blastic lesions noted in the visualized portions of the skeleton. CTA ABDOMEN AND PELVIS FINDINGS Hepatobiliary: No suspicious cystic or solid hepatic lesions. No intra or extrahepatic biliary ductal dilatation. Numerous tiny calcified gallstones lying dependently in the gallbladder. No findings to suggest an acute cholecystitis at this time. Pancreas: No pancreatic mass. No pancreatic  ductal dilatation. No pancreatic or peripancreatic fluid collections or inflammatory changes. Spleen: Unremarkable. Adrenals/Urinary Tract: 4.2 cm low-attenuation lesion in the posterior aspect of the interpolar region of the left kidney, compatible with a simple cyst. Right kidney and bilateral adrenal glands are normal in appearance. No hydroureteronephrosis. Urinary bladder is nearly decompressed, but otherwise unremarkable in appearance. Stomach/Bowel: The appearance of the stomach is normal. No pathologic dilatation of small bowel or colon. Numerous colonic diverticulae are noted, particularly in the sigmoid colon, without surrounding inflammatory changes to suggest an acute diverticulitis at this time. The appendix is not confidently identified and may be surgically absent. Regardless, there are no inflammatory changes noted adjacent to the cecum to suggest the presence of an acute appendicitis at this time. Vascular/Lymphatic: Atherosclerotic calcifications in the abdominal aorta and pelvic vasculature. No aneurysm or dissection noted in the abdominal or pelvic vasculature. No lymphadenopathy noted in the abdomen or pelvis. Reproductive: Status post hysterectomy. Ovaries are not confidently identified may be surgically absent or atrophic. Other: Extensive edema in the presacral space and perirectal soft tissues is of uncertain etiology and significance. No significant volume of ascites. No pneumoperitoneum. Musculoskeletal: There are no aggressive appearing lytic or blastic lesions noted in the visualized portions of the skeleton. VASCULAR MEASUREMENTS PERTINENT TO TAVR: AORTA: Minimal Aortic Diameter-13 x 12 mm Severity of Aortic Calcification-mild RIGHT PELVIS: Right Common Iliac Artery - Minimal Diameter-7.9 x 7.9 mm Tortuosity-mild Calcification-mild Right External Iliac Artery - Minimal Diameter-6.6 x 5.9 mm Tortuosity-mild Calcification-none Right Common Femoral Artery - Minimal Diameter-6.6 x 6.7 mm  Tortuosity-mild Calcification-none LEFT PELVIS: Left Common Iliac Artery - Minimal Diameter-9.4 x 7.2 mm Tortuosity-mild to moderate Calcification-mild Left External Iliac Artery - Minimal Diameter-7.0 x 6.5 mm Tortuosity-mild-to-moderate Calcification-none Left Common Femoral Artery - Minimal Diameter-6.8 x 6.5 mm Tortuosity-mild Calcification-mild Review of the MIP images confirms the above findings. IMPRESSION: 1. Vascular findings and measurements pertinent to potential TAVR procedure, as detailed above. 2. Severe thickening calcification of the aortic valve, compatible with reported clinical history of severe aortic stenosis. 3. There is also severe calcifications of the mitral annulus. 4. Cholelithiasis without evidence of acute cholecystitis. 5. Colonic diverticulosis without evidence of acute diverticulitis at this time. 6. Extensive edema in the presacral space and perirectal soft tissues is of uncertain etiology and significance, but clinical correlation for signs and symptoms of proctitis is recommended. 7. Additional incidental findings, as above. Electronically Signed   By: Vinnie Langton M.D.   On: 08/26/2020 13:02   ECHOCARDIOGRAM COMPLETE  Result Date: 08/31/2020    ECHOCARDIOGRAM REPORT   Patient Name:   Terri King Date of Exam: 08/31/2020 Medical Rec #:  [id number]       Height:       63.0 in Accession #:    [id number]      Weight:       159.6 lb Date of Birth:  1934/09/23       BSA:  1.757 m Patient Age:    51 years        BP:           127/70 mmHg Patient Gender: F               HR:           71 bpm. Exam Location:  Inpatient Procedure: 2D Echo, Cardiac Doppler and Color Doppler Indications:    post TAVR evaluation  History:        Patient has prior history of Echocardiogram examinations, most                 recent 08/30/2020. CAD, Stroke, Aortic Valve Disease,                 Signs/Symptoms:Syncope; Risk Factors:Hypertension and                 Dyslipidemia.  Sonographer:     Dustin Flock Referring Phys: [place]  1. Left ventricular ejection fraction, by estimation, is 60 to 65%. The left ventricle has normal function. The left ventricle has no regional wall motion abnormalities. Left ventricular diastolic parameters are indeterminate.  2. Right ventricular systolic function is normal. The right ventricular size is normal. There is normal pulmonary artery systolic pressure.  3. Left atrial size was moderately dilated.  4. The mitral valve is degenerative. Mild mitral valve regurgitation. No evidence of mitral stenosis. Severe mitral annular calcification.  5. Well placed 21 mm Medtronic Evolut Pro valve Trivial central aortic regurgitation mean gradient 11 peak 26.5 mmHg AVA 1.8 cm2 . The aortic valve has been repaired/replaced. Aortic valve regurgitation is not visualized. No aortic stenosis is present. Procedure Date: 08/30/2020.  6. The inferior vena cava is normal in size with greater than 50% respiratory variability, suggesting right atrial pressure of 3 mmHg. FINDINGS  Left Ventricle: Left ventricular ejection fraction, by estimation, is 60 to 65%. The left ventricle has normal function. The left ventricle has no regional wall motion abnormalities. The left ventricular internal cavity size was normal in size. There is  no left ventricular hypertrophy. Left ventricular diastolic parameters are indeterminate. Right Ventricle: The right ventricular size is normal. No increase in right ventricular wall thickness. Right ventricular systolic function is normal. There is normal pulmonary artery systolic pressure. The tricuspid regurgitant velocity is 2.65 m/s, and  with an assumed right atrial pressure of 3 mmHg, the estimated right ventricular systolic pressure is 03.7 mmHg. Left Atrium: Left atrial size was moderately dilated. Right Atrium: Right atrial size was normal in size. Pericardium: There is no evidence of pericardial effusion. Mitral Valve:  The mitral valve is degenerative in appearance. Severe mitral annular calcification. Mild mitral valve regurgitation. No evidence of mitral valve stenosis. Tricuspid Valve: The tricuspid valve is normal in structure. Tricuspid valve regurgitation is not demonstrated. No evidence of tricuspid stenosis. Aortic Valve: Well placed 21 mm Medtronic Evolut Pro valve Trivial central aortic regurgitation mean gradient 11 peak 26.5 mmHg AVA 1.8 cm2. The aortic valve has been repaired/replaced. Aortic valve regurgitation is not visualized. No aortic stenosis is present. Aortic valve mean gradient measures 11.0 mmHg. Aortic valve peak gradient measures 26.5 mmHg. Aortic valve area, by VTI measures 1.81 cm. There is a 23 mm Medtronic stented (TAVR) valve present in the aortic position. Pulmonic Valve: The pulmonic valve was normal in structure. Pulmonic valve regurgitation is not visualized. No evidence of pulmonic stenosis. Aorta: The aortic root is normal in size and  structure. Venous: The inferior vena cava is normal in size with greater than 50% respiratory variability, suggesting right atrial pressure of 3 mmHg. IAS/Shunts: No atrial level shunt detected by color flow Doppler.  LEFT VENTRICLE PLAX 2D LVIDd:         3.60 cm  Diastology LVIDs:         2.60 cm  LV e' medial:    4.57 cm/s LV PW:         1.10 cm  LV E/e' medial:  29.5 LV IVS:        1.00 cm  LV e' lateral:   6.31 cm/s LVOT diam:     1.80 cm  LV E/e' lateral: 21.4 LV SV:         87 LV SV Index:   50 LVOT Area:     2.54 cm  AORTIC VALVE AV Area (Vmax):    1.43 cm AV Area (Vmean):   1.72 cm AV Area (VTI):     1.81 cm AV Vmax:           257.33 cm/s AV Vmean:          158.000 cm/s AV VTI:            0.481 m AV Peak Grad:      26.5 mmHg AV Mean Grad:      11.0 mmHg LVOT Vmax:         145.00 cm/s LVOT Vmean:        107.000 cm/s LVOT VTI:          0.342 m LVOT/AV VTI ratio: 0.71 MITRAL VALVE                TRICUSPID VALVE MV Area (PHT): 1.83 cm     TR Peak grad:    28.1 mmHg MV Decel Time: 415 msec     TR Vmax:        265.00 cm/s MV E velocity: 135.00 cm/s MV A velocity: 158.00 cm/s  SHUNTS MV E/A ratio:  0.85         Systemic VTI:  0.34 m                             Systemic Diam: 1.80 cm Jenkins Rouge MD Electronically signed by Jenkins Rouge MD Signature Date/Time: 08/31/2020/5:44:35 PM    Final    ECHOCARDIOGRAM COMPLETE  Result Date: 08/20/2020    ECHOCARDIOGRAM REPORT   Patient Name:   Terri King Date of Exam: 08/20/2020 Medical Rec #:  [id number]       Height:       63.0 in Accession #:    [id number]      Weight:       165.7 lb Date of Birth:  [birth year]       BSA:          1.785 m Patient Age:    75 years        BP:           248/59 mmHg Patient Gender: F               HR:           79 bpm. Exam Location:  Inpatient Procedure: 2D Echo, 3D Echo, Cardiac Doppler, Color Doppler and Strain Analysis Indications:    I35.0 Nonrheumatic aortic (valve) stenosis  History:        Patient has prior history of Echocardiogram examinations, most  recent 04/08/2019. Aortic Valve Disease, Signs/Symptoms:Syncope,                 Fever and Chest Pain; Risk Factors:Hypertension and                 Dyslipidemia. Edema. Aortic stenosis.  Sonographer:    Roseanna Rainbow RDCS Referring Phys: [id number] Eben Burow  Sonographer Comments: Global longitudinal strain was attempted. Patient supine. Could not turn due to broken leg. IMPRESSIONS  1. The aortic valve is calcified. There is severe calcifcation of the aortic valve. There is severe thickening of the aortic valve. Aortic valve regurgitation is mild. Severe aortic valve stenosis. AVA 0.9cm2, mean gradient 27mHg, peak gradient 629mg,  Vmax 4.50m34m 2. Left ventricular ejection fraction, by estimation, is 65 to 70%. Left ventricular ejection fraction by 3D volume is 71 %. The left ventricle has normal function. The left ventricle has no regional wall motion abnormalities. There is moderate asymmetric hypertrophy of the  basal-septum. The rest of the LV segments demonstrate mild left ventricular hypertrophy. Left ventricular diastolic parameters are consistent with Grade I diastolic dysfunction (impaired relaxation). Elevated left atrial pressure. The average left ventricular global longitudinal strain is -20.2 %. The global longitudinal strain is normal.  3. Right ventricular systolic function is normal. The right ventricular size is normal. There is severely elevated pulmonary artery systolic pressure. The estimated right ventricular systolic pressure is 86.77.8Hg.  4. The mitral valve is abnormal. There is moderate thickening of the mitral valve leaflet(s). There is moderate calcification of the mitral valve leaflet(s). Moderate to severe mitral annular calcification. Trivial mitral valve regurgitation. Mild calcific mitral stenosis with MVA 1.5cm2 by continuity, mean gradient 4mm15mat HR 74bpm.  5. The inferior vena cava is dilated in size with >50% respiratory variability, suggesting right atrial pressure of 8 mmHg. Comparison(s): Compared to prior TTE in 03/2019, the aortic stenosis is now severe (previously moderate) and there is severe pulmonary hypertension with PASP 67mm69mFINDINGS  Left Ventricle: Left ventricular ejection fraction, by estimation, is 65 to 70%. Left ventricular ejection fraction by 3D volume is 71 %. The left ventricle has normal function. The left ventricle has no regional wall motion abnormalities. The average left ventricular global longitudinal strain is -20.2 %. The global longitudinal strain is normal. The left ventricular internal cavity size was normal in size. There is moderate asymmetric hypertrophy of the basal-septum. The rest of the LV segments demonstrate mild left ventricular hypertrophy. Left ventricular diastolic parameters are consistent with Grade I diastolic dysfunction (impaired relaxation). Elevated left atrial pressure. Right Ventricle: The right ventricular size is normal. No  increase in right ventricular wall thickness. Right ventricular systolic function is normal. There is severely elevated pulmonary artery systolic pressure. The tricuspid regurgitant velocity is 4.22 m/s, and with an assumed right atrial pressure of 15 mmHg, the estimated right ventricular systolic pressure is 86.2 24.2. Left Atrium: Left atrial size was normal in size. Right Atrium: Right atrial size was normal in size. Pericardium: There is no evidence of pericardial effusion. Mitral Valve: The mitral valve is abnormal. There is moderate thickening of the mitral valve leaflet(s). There is moderate calcification of the mitral valve leaflet(s). Moderate to severe mitral annular calcification. Trivial mitral valve regurgitation. MV peak gradient, 14.8 mmHg. The mean mitral valve gradient is 4.0 mmHg. Mild calcific mitral stenosis with MVA 1.5cm2 by continuity, mean gradient 4mmHg39m HR 74bpm. Tricuspid Valve: The tricuspid valve is normal in structure. Tricuspid valve regurgitation is mild.  Aortic Valve: There is severe calcifcation of the aortic valve. There is severe thickening of the aortic valve. Aortic valve regurgitation is mild. Aortic regurgitation PHT measures 426 msec. Severe aortic stenosis is present. AVA 0.9cm2, mean gradient 32mHg, peak gradient 670mg, Vmax 4.43m12m Pulmonic Valve: The pulmonic valve was not well visualized. Pulmonic valve regurgitation is trivial. Aorta: The aortic root and ascending aorta are structurally normal, with no evidence of dilitation. Venous: The inferior vena cava is dilated in size with greater than 50% respiratory variability, suggesting right atrial pressure of 8 mmHg. IAS/Shunts: The interatrial septum is aneurysmal. No atrial level shunt detected by color flow Doppler.  LEFT VENTRICLE PLAX 2D LVIDd:         2.90 cm         Diastology LVIDs:         1.70 cm         LV e' medial:    5.22 cm/s LV PW:         1.50 cm         LV E/e' medial:  22.5 LV IVS:        1.40 cm          LV e' lateral:   7.51 cm/s LVOT diam:     1.80 cm         LV E/e' lateral: 15.6 LV SV:         68 LV SV Index:   38              2D LVOT Area:     2.54 cm        Longitudinal                                Strain                                2D Strain GLS  -20.2 % LV Volumes (MOD)               Avg: LV vol d, MOD    57.6 ml A2C:                           3D Volume EF LV vol d, MOD    48.3 ml       LV 3D EF:    Left A4C:                                        ventricular LV vol s, MOD    17.3 ml                    ejection A2C:                                        fraction by LV vol s, MOD    11.3 ml                    3D volume A4C:  is 71 %. LV SV MOD A2C:   40.3 ml LV SV MOD A4C:   48.3 ml LV SV MOD BP:    39.0 ml       3D Volume EF:                                3D EF:        71 % RIGHT VENTRICLE             IVC RV S prime:     11.90 cm/s  IVC diam: 2.00 cm TAPSE (M-mode): 2.2 cm LEFT ATRIUM             Index       RIGHT ATRIUM           Index LA diam:        3.70 cm 2.07 cm/m  RA Area:     14.40 cm LA Vol (A2C):   40.8 ml 22.86 ml/m RA Volume:   35.60 ml  19.94 ml/m LA Vol (A4C):   32.2 ml 18.04 ml/m LA Biplane Vol: 37.8 ml 21.18 ml/m  AORTIC VALVE AV Area (Vmax):    0.94 cm AV Area (Vmean):   0.92 cm AV Area (VTI):     0.92 cm AV Vmax:           381.00 cm/s AV Vmean:          265.333 cm/s AV VTI:            0.743 m AV Peak Grad:      58.1 mmHg AV Mean Grad:      33.0 mmHg LVOT Vmax:         140.00 cm/s LVOT Vmean:        95.900 cm/s LVOT VTI:          0.269 m LVOT/AV VTI ratio: 0.36 AI PHT:            426 msec  AORTA Ao Root diam: 2.80 cm Ao Asc diam:  3.10 cm MITRAL VALVE                TRICUSPID VALVE MV Area (PHT): 2.79 cm     TR Peak grad:   71.2 mmHg MV Area VTI:   1.38 cm     TR Vmax:        422.00 cm/s MV Peak grad:  14.8 mmHg MV Mean grad:  4.0 mmHg     SHUNTS MV Vmax:       1.92 m/s     Systemic VTI:  0.27 m MV Vmean:      90.3 cm/s    Systemic  Diam: 1.80 cm MV Decel Time: 272 msec MV E velocity: 117.50 cm/s MV A velocity: 175.00 cm/s MV E/A ratio:  0.67 Gwyndolyn Kaufman MD Electronically signed by Gwyndolyn Kaufman MD Signature Date/Time: 08/20/2020/2:56:15 PM    Final    DG Hip Unilat W or Wo Pelvis 2-3 Views Left  Result Date: 08/19/2020 CLINICAL DATA:  Syncope, fall.  Left hip pain. EXAM: DG HIP (WITH OR WITHOUT PELVIS) 2-3V LEFT COMPARISON:  None. FINDINGS: No acute fracture or dislocation. Advanced left hip osteoarthritis with near complete joint space loss, subchondral cystic change and sclerosis, and peripheral osteophytes. Pubic rami are intact. The bones are diffusely under mineralized. There is moderate right hip osteoarthritis. Pubic symphysis and sacroiliac joints are congruent. IMPRESSION: 1. No acute fracture or dislocation of the  left hip. 2. Advanced left hip osteoarthritis. Electronically Signed   By: Keith Rake M.D.   On: 08/19/2020 18:53   VAS US CAROTID  Result Date: 08/24/2020 Carotid Arterial Duplex Study Patient Name:  Terri King  Date of Exam:   08/23/2020 Medical Rec #: [id number]        Accession #:    [id number] Date of Birth: Dec 14, 1934        Patient Gender: F Patient Age:   34Z Exam Location:  [facility] Procedure:      VAS US CAROTID Referring Phys: [place] --------------------------------------------------------------------------------  Indications:       Syncope. Risk Factors:      Hypertension, hyperlipidemia, no history of smoking, prior                    MI, coronary artery disease. Other Factors:     AO stenosis. Comparison Study:  No previous exams Performing Technologist: Jody Hill RVT, RDMS  Examination Guidelines: A complete evaluation includes B-mode imaging, spectral Doppler, color Doppler, and power Doppler as needed of all accessible portions of each vessel. Bilateral testing is considered an integral part of a complete examination. Limited examinations for reoccurring  indications may be performed as noted.  Right Carotid Findings: +----------+--------+--------+--------+---------------------+------------------+           PSV cm/sEDV cm/sStenosisPlaque Description   Comments           +----------+--------+--------+--------+---------------------+------------------+ CCA Prox  78      0                                    intimal thickening +----------+--------+--------+--------+---------------------+------------------+ CCA Distal64      6               calcific, focal and  intimal thickening                                   irregular                               +----------+--------+--------+--------+---------------------+------------------+ ICA Prox  91      11      1-39%   calcific, smooth and                                                      focal                                   +----------+--------+--------+--------+---------------------+------------------+ ICA Mid                           calcific,                                                                 heterogenous and  irregular                               +----------+--------+--------+--------+---------------------+------------------+ ICA Distal77      11                                                      +----------+--------+--------+--------+---------------------+------------------+ ECA       109     0                                                       +----------+--------+--------+--------+---------------------+------------------+ +----------+--------+-------+----------------+-------------------+           PSV cm/sEDV cmsDescribe        Arm Pressure (mmHG) +----------+--------+-------+----------------+-------------------+ [id number]            Multiphasic, WNL                    +----------+--------+-------+----------------+-------------------+  +---------+--------+--+--------+-+--------------+ VertebralPSV cm/s93EDV cm/s0High resistant +---------+--------+--+--------+-+--------------+  Left Carotid Findings: +----------+--------+--------+--------+---------------------+------------------+           PSV cm/sEDV cm/sStenosisPlaque Description   Comments           +----------+--------+--------+--------+---------------------+------------------+ CCA Prox  58      0                                    intimal thickening +----------+--------+--------+--------+---------------------+------------------+ CCA Distal58      0                                    intimal thickening +----------+--------+--------+--------+---------------------+------------------+ ICA Prox  77      12              focal and                                                                 heterogenous                            +----------+--------+--------+--------+---------------------+------------------+ ICA Distal71      13                                                      +----------+--------+--------+--------+---------------------+------------------+ ECA       63      0               smooth and calcific                     +----------+--------+--------+--------+---------------------+------------------+ +----------+--------+--------+----------------+-------------------+           PSV cm/sEDV  cm/sDescribe        Arm Pressure (mmHG) +----------+--------+--------+----------------+-------------------+ Subclavian                Multiphasic, WNL                    +----------+--------+--------+----------------+-------------------+ +---------+--------+--+--------+-+--------------+ VertebralPSV cm/s30EDV cm/s0High resistant +---------+--------+--+--------+-+--------------+   Summary:   *See table(s) above for measurements and observations.  Electronically signed by Jamelle Haring on 08/24/2020 at 5:18:47 PM.    Final     ECHOCARDIOGRAM LIMITED  Result Date: 08/30/2020    ECHOCARDIOGRAM LIMITED REPORT   Patient Name:   Terri King Date of Exam: 08/30/2020 Medical Rec #:  [id number]       Height:       63.0 in Accession #:    [id number]      Weight:       152.1 lb Date of Birth:  03-04-1934       BSA:          1.721 m Patient Age:    19 years        BP:           167/53 mmHg Patient Gender: F               HR:           74 bpm. Exam Location:  Inpatient Procedure: Limited Echo, Cardiac Doppler and Color Doppler Indications:    Aortic stenosis I35.0  History:        Patient has prior history of Echocardiogram examinations, most                 recent 08/20/2020. Risk Factors:Hypertension, Dyslipidemia and                 Non-Smoker.                 Aortic Valve: 23 mm stented (TAVR) valve is present in the                 aortic position. Procedure Date: 08/30/2020.  Sonographer:    Vickie Epley RDCS Referring Phys: [id number] [place]  1. Left ventricular ejection fraction, by estimation, is 65 to 70%. The left ventricle has normal function.  2. Right ventricular systolic function is normal. The right ventricular size is normal.  3. The mitral valve is degenerative. Mild mitral valve regurgitation. Severe mitral annular calcification.  4. Pre TAVR: poorly visualized by TTE supine in cath lab known to be tri leaflet on CT severe calcification with severe AS mean gradient 28 peak 48 mmHg AVA 0.72 cm2         Post TAVR: well placed 21 mm Medtronic Evolut Pro valve. No PVL. Initially moderate central AR. Pigtail catheter manipulated across valve repeat imaging with mild appearing still central AR mean gradient 6 mmHg peak 9 mmHg AVA 1.8 cm2. There is a 23 mm stented (TAVR) valve present in the aortic position. Procedure Date: 08/30/2020. FINDINGS  Left Ventricle: Left ventricular ejection fraction, by estimation, is 65 to 70%. The left ventricle has normal function. The left ventricular internal cavity size was  small. There is no left ventricular hypertrophy. Right Ventricle: The right ventricular size is normal. No increase in right ventricular wall thickness. Right ventricular systolic function is normal. Pericardium: There is no evidence of pericardial effusion. Mitral Valve: The mitral valve is degenerative in appearance. There is moderate thickening of the mitral valve leaflet(s). There is moderate calcification of  the mitral valve leaflet(s). Severe mitral annular calcification. Mild mitral valve regurgitation. Aortic Valve: Pre TAVR: poorly visualized by TTE supine in cath lab known to be tri leaflet on CT severe calcification with severe AS mean gradient 28 peak 48 mmHg AVA 0.72 cm2 Post TAVR: well placed 21 mm Medtronic Evolut Pro valve. No PVL. Initially moderate central AR. Pigtail catheter manipulated across valve repeat imaging with mild appearing still central AR mean gradient 6 mmHg peak 9 mmHg AVA 1.8 cm2. Aortic valve mean gradient measures 19.0 mmHg. Aortic valve peak gradient measures 27.2 mmHg. Aortic valve area, by VTI measures 1.09 cm. There is a 23 mm stented (TAVR) valve present in the aortic position. Procedure Date: 08/30/2020. LEFT VENTRICLE PLAX 2D LVOT diam:     1.60 cm LV SV:         63 LV SV Index:   37 LVOT Area:     2.01 cm  AORTIC VALVE AV Area (Vmax):    1.03 cm AV Area (Vmean):   1.06 cm AV Area (VTI):     1.09 cm AV Vmax:           261.00 cm/s AV Vmean:          189.000 cm/s AV VTI:            0.578 m AV Peak Grad:      27.2 mmHg AV Mean Grad:      19.0 mmHg LVOT Vmax:         134.00 cm/s LVOT Vmean:        99.700 cm/s LVOT VTI:          0.313 m LVOT/AV VTI ratio: 0.54  SHUNTS Systemic VTI:  0.31 m Systemic Diam: 1.60 cm Jenkins Rouge MD Electronically signed by Jenkins Rouge MD Signature Date/Time: 08/30/2020/2:30:40 PM    Final    Structural Heart Procedure  Result Date: 08/30/2020 See surgical note for result.  CT Angio Abd/Pel w/ and/or w/o  Result Date: 08/26/2020 CLINICAL  DATA:  86 year old female with history of severe aortic stenosis. Preprocedural study prior to potential transcatheter aortic valve replacement (TAVR) procedure. EXAM: CT ANGIOGRAPHY CHEST, ABDOMEN AND PELVIS TECHNIQUE: Multidetector CT imaging through the chest, abdomen and pelvis was performed using the standard protocol during bolus administration of intravenous contrast. Multiplanar reconstructed images and MIPs were obtained and reviewed to evaluate the vascular anatomy. CONTRAST:  4m OMNIPAQUE IOHEXOL 350 MG/ML SOLN COMPARISON:  CT the chest 04/29/2015. FINDINGS: CTA CHEST FINDINGS Cardiovascular: Heart size is normal. There is no significant pericardial fluid, thickening or pericardial calcification. There is aortic atherosclerosis, as well as atherosclerosis of the great vessels of the mediastinum and the coronary arteries, including calcified atherosclerotic plaque in the left anterior descending coronary artery. Thickening calcification of the aortic valve. Severe calcifications of the mitral annulus. Mediastinum/Lymph Nodes: No pathologically enlarged mediastinal or hilar lymph nodes. Esophagus is unremarkable in appearance. No axillary lymphadenopathy. Lungs/Pleura: No acute consolidative airspace disease. No pleural effusions. Linear areas of scarring or subsegmental atelectasis in the lower lobes of the lungs bilaterally. No suspicious appearing pulmonary nodules or masses are noted. Musculoskeletal/Soft Tissues: There are no aggressive appearing lytic or blastic lesions noted in the visualized portions of the skeleton. CTA ABDOMEN AND PELVIS FINDINGS Hepatobiliary: No suspicious cystic or solid hepatic lesions. No intra or extrahepatic biliary ductal dilatation. Numerous tiny calcified gallstones lying dependently in the gallbladder. No findings to suggest an acute cholecystitis at this time. Pancreas: No pancreatic mass. No pancreatic ductal dilatation.  No pancreatic or peripancreatic fluid  collections or inflammatory changes. Spleen: Unremarkable. Adrenals/Urinary Tract: 4.2 cm low-attenuation lesion in the posterior aspect of the interpolar region of the left kidney, compatible with a simple cyst. Right kidney and bilateral adrenal glands are normal in appearance. No hydroureteronephrosis. Urinary bladder is nearly decompressed, but otherwise unremarkable in appearance. Stomach/Bowel: The appearance of the stomach is normal. No pathologic dilatation of small bowel or colon. Numerous colonic diverticulae are noted, particularly in the sigmoid colon, without surrounding inflammatory changes to suggest an acute diverticulitis at this time. The appendix is not confidently identified and may be surgically absent. Regardless, there are no inflammatory changes noted adjacent to the cecum to suggest the presence of an acute appendicitis at this time. Vascular/Lymphatic: Atherosclerotic calcifications in the abdominal aorta and pelvic vasculature. No aneurysm or dissection noted in the abdominal or pelvic vasculature. No lymphadenopathy noted in the abdomen or pelvis. Reproductive: Status post hysterectomy. Ovaries are not confidently identified may be surgically absent or atrophic. Other: Extensive edema in the presacral space and perirectal soft tissues is of uncertain etiology and significance. No significant volume of ascites. No pneumoperitoneum. Musculoskeletal: There are no aggressive appearing lytic or blastic lesions noted in the visualized portions of the skeleton. VASCULAR MEASUREMENTS PERTINENT TO TAVR: AORTA: Minimal Aortic Diameter-13 x 12 mm Severity of Aortic Calcification-mild RIGHT PELVIS: Right Common Iliac Artery - Minimal Diameter-7.9 x 7.9 mm Tortuosity-mild Calcification-mild Right External Iliac Artery - Minimal Diameter-6.6 x 5.9 mm Tortuosity-mild Calcification-none Right Common Femoral Artery - Minimal Diameter-6.6 x 6.7 mm Tortuosity-mild Calcification-none LEFT PELVIS: Left Common  Iliac Artery - Minimal Diameter-9.4 x 7.2 mm Tortuosity-mild to moderate Calcification-mild Left External Iliac Artery - Minimal Diameter-7.0 x 6.5 mm Tortuosity-mild-to-moderate Calcification-none Left Common Femoral Artery - Minimal Diameter-6.8 x 6.5 mm Tortuosity-mild Calcification-mild Review of the MIP images confirms the above findings. IMPRESSION: 1. Vascular findings and measurements pertinent to potential TAVR procedure, as detailed above. 2. Severe thickening calcification of the aortic valve, compatible with reported clinical history of severe aortic stenosis. 3. There is also severe calcifications of the mitral annulus. 4. Cholelithiasis without evidence of acute cholecystitis. 5. Colonic diverticulosis without evidence of acute diverticulitis at this time. 6. Extensive edema in the presacral space and perirectal soft tissues is of uncertain etiology and significance, but clinical correlation for signs and symptoms of proctitis is recommended. 7. Additional incidental findings, as above. Electronically Signed   By: Vinnie Langton M.D.   On: 08/26/2020 13:02      Subjective: Patient seen and examined at the bedside this morning.  Hemodynamically stable for discharge.  Comfortable.  I discussed the discharge planning with the son on phone.  Discharge Exam: Vitals:   09/02/20 0357 09/02/20 0841  BP: 134/90 (!) 141/56  Pulse: 74 72  Resp: 20   Temp: 98.1 F (36.7 C)   SpO2: 100%    Vitals:   09/01/20 2004 09/01/20 2324 09/02/20 0357 09/02/20 0841  BP: (!) 135/46 (!) 133/45 134/90 (!) 141/56  Pulse: 72 76 74 72  Resp: '13 20 20   ' Temp: 98 F (36.7 C) 97.8 F (36.6 C) 98.1 F (36.7 C)   TempSrc: Oral Oral Oral   SpO2: 100% 100% 100%   Weight:   70.2 kg   Height:        General: Pt is alert, awake, not in acute distress Cardiovascular: RRR, S1/S2 +, no rubs, no gallops Respiratory: CTA bilaterally, no wheezing, no rhonchi Abdominal: Soft, NT, ND, bowel sounds +  Extremities:  no edema, no cyanosis    The results of significant diagnostics from this hospitalization (including imaging, microbiology, ancillary and laboratory) are listed below for reference.     Microbiology: Recent Results (from the past 240 hour(s))  Surgical pcr screen     Status: Abnormal   Collection Time: 08/29/20  5:40 PM   Specimen: Nasal Mucosa; Nasal Swab  Result Value Ref Range Status   MRSA, PCR POSITIVE (A) NEGATIVE Final    Comment: RESULT CALLED TO, READ BACK BY AND VERIFIED WITH: NITURIDA,L RN 08/29/2020 AT 2342 SKEEN,P    Staphylococcus aureus POSITIVE (A) NEGATIVE Final    Comment: (NOTE) The Xpert SA Assay (FDA approved for NASAL specimens in patients 27 years of age and older), is one component of a comprehensive surveillance program. It is not intended to diagnose infection nor to guide or monitor treatment. Performed at [facility], [place] [address] [place], [place] [address]   Urine Culture     Status: Abnormal   Collection Time: 08/30/20 11:07 AM   Specimen: Urine, Random  Result Value Ref Range Status   Specimen Description URINE, RANDOM  Final   Special Requests   Final    NONE Performed at [facility], [place] [address] [place], [place] [address]    Culture >=100,000 COLONIES/mL STAPHYLOCOCCUS LUGDUNENSIS (A)  Final   Report Status 09/01/2020 FINAL  Final   Organism ID, Bacteria STAPHYLOCOCCUS LUGDUNENSIS (A)  Final      Susceptibility   Staphylococcus lugdunensis - MIC*    CIPROFLOXACIN <=0.5 SENSITIVE Sensitive     GENTAMICIN <=0.5 SENSITIVE Sensitive     NITROFURANTOIN <=16 SENSITIVE Sensitive     OXACILLIN 0.5 SENSITIVE Sensitive     TETRACYCLINE <=1 SENSITIVE Sensitive     VANCOMYCIN <=0.5 SENSITIVE Sensitive     TRIMETH/SULFA <=10 SENSITIVE Sensitive     CLINDAMYCIN <=0.25 SENSITIVE Sensitive     RIFAMPIN <=0.5 SENSITIVE Sensitive     Inducible Clindamycin NEGATIVE Sensitive     * >=100,000 COLONIES/mL STAPHYLOCOCCUS LUGDUNENSIS   SARS CORONAVIRUS 2 (TAT 6-24 HRS) Nasopharyngeal Nasopharyngeal Swab     Status: None   Collection Time: 09/01/20 11:30 AM   Specimen: Nasopharyngeal Swab  Result Value Ref Range Status   SARS Coronavirus 2 NEGATIVE NEGATIVE Final    Comment: (NOTE) SARS-CoV-2 target nucleic acids are NOT DETECTED.  The SARS-CoV-2 RNA is generally detectable in upper and lower respiratory specimens during the acute phase of infection. Negative results do not preclude SARS-CoV-2 infection, do not rule out co-infections with other pathogens, and should not be used as the sole basis for treatment or other patient management decisions. Negative results must be combined with clinical observations, patient history, and epidemiological information. The expected result is Negative.  Fact Sheet for Patients: SugarRoll.be  Fact Sheet for Healthcare Providers: https://www.woods-mathews.com/  This test is not yet approved or cleared by the [place] FDA and  has been authorized for detection and/or diagnosis of SARS-CoV-2 by FDA under an Emergency Use Authorization (EUA). This EUA will remain  in effect (meaning this test can be used) for the duration of the COVID-19 declaration under Se ction 564(b)(1) of the Act, 21 U.S.C. section 360bbb-3(b)(1), unless the authorization is terminated or revoked sooner.  Performed at [facility], [place] [address] [place], [place] [address]      Labs: BNP (last 3 results) No results for input(s): BNP in the last 8760 hours. Basic Metabolic Panel: Recent Labs  Lab 08/27/20 0134 08/28/20 0307 08/29/20 0428 08/30/20 0208 08/30/20 1310 08/30/20 1518 08/31/20 0625  NA 133* 135 132* 133* 136 138 135  K 3.8 3.6 3.2* 3.8 3.8 3.8 3.5  CL 103 101 101 104 106 106 105  CO2 '25 27 23 22  ' --   --  22  GLUCOSE 117* 125* 118* 140* 159* 152* 138*  BUN '13 13 12 15 17 16 18  ' CREATININE 0.99 1.00 0.86 0.95 0.80 0.70 0.86   CALCIUM 9.5 9.9 9.3 9.9  --   --  9.7  MG 2.0 2.0  --   --   --   --  2.1   Liver Function Tests: Recent Labs  Lab 08/29/20 0428  AST 24  ALT 32  ALKPHOS 92  BILITOT 1.1  PROT 6.2*  ALBUMIN 3.0*   No results for input(s): LIPASE, AMYLASE in the last 168 hours. No results for input(s): AMMONIA in the last 168 hours. CBC: Recent Labs  Lab 08/27/20 0134 08/28/20 0307 08/29/20 0428 08/30/20 0208 08/30/20 1310 08/30/20 1518 08/31/20 0625 09/01/20 0114  WBC 8.8 10.2 9.8 14.0*  --   --  24.2* 12.9*  NEUTROABS 5.0 6.3  --   --   --   --  20.9* 9.3*  HGB 12.1 12.0 12.4 12.7 13.3 10.5* 11.4* 11.1*  HCT 34.6* 35.8* 36.1 36.3 39.0 31.0* 33.3* 32.0*  MCV 92.3 93.2 91.4 91.4  --   --  92.2 93.3  PLT 240 265 277 289  --   --  278 224   Cardiac Enzymes: No results for input(s): CKTOTAL, CKMB, CKMBINDEX, TROPONINI in the last 168 hours. BNP: Invalid input(s): POCBNP CBG: No results for input(s): GLUCAP in the last 168 hours. D-Dimer No results for input(s): DDIMER in the last 72 hours. Hgb A1c No results for input(s): HGBA1C in the last 72 hours.  Lipid Profile No results for input(s): CHOL, HDL, LDLCALC, TRIG, CHOLHDL, LDLDIRECT in the last 72 hours. Thyroid function studies No results for input(s): TSH, T4TOTAL, T3FREE, THYROIDAB in the last 72 hours.  Invalid input(s): FREET3 Anemia work up No results for input(s): VITAMINB12, FOLATE, FERRITIN, TIBC, IRON, RETICCTPCT in the last 72 hours. Urinalysis    Component Value Date/Time   COLORURINE YELLOW 08/31/2020 0831   APPEARANCEUR CLEAR 08/31/2020 0831   LABSPEC 1.024 08/31/2020 0831   PHURINE 5.0 08/31/2020 0831   GLUCOSEU NEGATIVE 08/31/2020 0831   GLUCOSEU NEGATIVE 05/10/2020 1051   HGBUR NEGATIVE 08/31/2020 0831   BILIRUBINUR NEGATIVE 08/31/2020 0831   KETONESUR NEGATIVE 08/31/2020 0831   PROTEINUR NEGATIVE 08/31/2020 0831   UROBILINOGEN 0.2 05/10/2020 1051   NITRITE NEGATIVE 08/31/2020 0831   LEUKOCYTESUR  SMALL (A) 08/31/2020 0831   Sepsis Labs Invalid input(s): PROCALCITONIN,  WBC,  LACTICIDVEN Microbiology Recent Results (from the past 240 hour(s))  Surgical pcr screen     Status: Abnormal   Collection Time: 08/29/20  5:40 PM   Specimen: Nasal Mucosa; Nasal Swab  Result Value Ref Range Status   MRSA, PCR POSITIVE (A) NEGATIVE Final    Comment: RESULT CALLED TO, READ BACK BY AND VERIFIED WITH: NITURIDA,L RN 08/29/2020 AT 2342 SKEEN,P    Staphylococcus aureus POSITIVE (A) NEGATIVE Final    Comment: (NOTE) The Xpert SA Assay (FDA approved for NASAL specimens in patients 48 years of age and older), is one component of a comprehensive surveillance program. It is not intended to diagnose infection nor to guide or monitor treatment. Performed at [facility], [address] [place], [place] [address]   Urine Culture     Status: Abnormal   Collection Time: 08/30/20 11:07 AM   Specimen: Urine, Random  Result Value Ref Range Status   Specimen Description URINE, RANDOM  Final   Special Requests   Final    NONE Performed at [facility], [place] [address], [place], [place] [address]    Culture >=100,000 COLONIES/mL STAPHYLOCOCCUS LUGDUNENSIS (A)  Final   Report Status 09/01/2020 FINAL  Final   Organism ID, Bacteria STAPHYLOCOCCUS LUGDUNENSIS (A)  Final      Susceptibility   Staphylococcus lugdunensis - MIC*    CIPROFLOXACIN <=0.5 SENSITIVE Sensitive     GENTAMICIN <=0.5 SENSITIVE Sensitive     NITROFURANTOIN <=16 SENSITIVE Sensitive     OXACILLIN 0.5 SENSITIVE Sensitive     TETRACYCLINE <=1 SENSITIVE Sensitive     VANCOMYCIN <=0.5 SENSITIVE Sensitive     TRIMETH/SULFA <=10 SENSITIVE Sensitive     CLINDAMYCIN <=0.25 SENSITIVE Sensitive     RIFAMPIN <=0.5 SENSITIVE Sensitive     Inducible Clindamycin NEGATIVE Sensitive     * >=100,000 COLONIES/mL STAPHYLOCOCCUS LUGDUNENSIS  SARS CORONAVIRUS 2 (TAT 6-24 HRS) Nasopharyngeal Nasopharyngeal Swab     Status: None   Collection  Time: 09/01/20 11:30 AM   Specimen: Nasopharyngeal Swab  Result Value Ref Range Status   SARS Coronavirus 2 NEGATIVE NEGATIVE Final    Comment: (NOTE) SARS-CoV-2 target nucleic acids are NOT DETECTED.  The SARS-CoV-2 RNA is generally detectable in upper and lower respiratory specimens during the acute phase of infection. Negative results do not preclude SARS-CoV-2 infection, do not rule out co-infections with other pathogens, and should not be used as the sole basis for treatment or other patient management decisions. Negative results must be combined with clinical observations, patient history, and epidemiological information. The expected result is Negative.  Fact Sheet for Patients: SugarRoll.be  Fact Sheet for Healthcare Providers: https://www.woods-mathews.com/  This test is not yet approved or cleared by the [place] FDA and  has been authorized for detection and/or diagnosis of SARS-CoV-2 by FDA under an Emergency Use Authorization (EUA). This EUA will remain  in effect (meaning this test can be used) for the duration of the COVID-19 declaration under Se ction 564(b)(1) of the Act, 21 U.S.C. section 360bbb-3(b)(1), unless the authorization is terminated or revoked sooner.  Performed at [facility], [place] [address] [place], [place] [address]     Please note: You were cared for by a hospitalist during your hospital stay. Once you are discharged, your primary care physician will handle any further medical issues. Please note that NO REFILLS for any discharge medications will be authorized once you are discharged, as it is imperative that you return to your primary care physician (or establish a relationship with a primary care physician if you do not have one) for your post hospital discharge needs so that they can reassess your need for medications and monitor your lab values.    Time coordinating discharge: 40  minutes  SIGNED:   Shelly Coss, MD  [facility] 09/02/2020, 12:12 PM Pager [phone]  If 7PM-7AM, please contact night-coverage [email or web address] Password [email or web address]

## 2020-09-02 DIAGNOSIS — Z743 Need for continuous supervision: Secondary | ICD-10-CM | POA: Diagnosis not present

## 2020-09-02 DIAGNOSIS — G459 Transient cerebral ischemic attack, unspecified: Secondary | ICD-10-CM | POA: Diagnosis not present

## 2020-09-02 DIAGNOSIS — R1084 Generalized abdominal pain: Secondary | ICD-10-CM | POA: Diagnosis not present

## 2020-09-02 DIAGNOSIS — K8020 Calculus of gallbladder without cholecystitis without obstruction: Secondary | ICD-10-CM | POA: Diagnosis not present

## 2020-09-02 DIAGNOSIS — K5903 Drug induced constipation: Secondary | ICD-10-CM | POA: Diagnosis not present

## 2020-09-02 DIAGNOSIS — T7840XA Allergy, unspecified, initial encounter: Secondary | ICD-10-CM | POA: Diagnosis not present

## 2020-09-02 DIAGNOSIS — R300 Dysuria: Secondary | ICD-10-CM | POA: Diagnosis not present

## 2020-09-02 DIAGNOSIS — H538 Other visual disturbances: Secondary | ICD-10-CM | POA: Diagnosis not present

## 2020-09-02 DIAGNOSIS — K219 Gastro-esophageal reflux disease without esophagitis: Secondary | ICD-10-CM | POA: Diagnosis not present

## 2020-09-02 DIAGNOSIS — E785 Hyperlipidemia, unspecified: Secondary | ICD-10-CM | POA: Diagnosis not present

## 2020-09-02 DIAGNOSIS — S79929A Unspecified injury of unspecified thigh, initial encounter: Secondary | ICD-10-CM | POA: Diagnosis not present

## 2020-09-02 DIAGNOSIS — D649 Anemia, unspecified: Secondary | ICD-10-CM | POA: Diagnosis not present

## 2020-09-02 DIAGNOSIS — M25462 Effusion, left knee: Secondary | ICD-10-CM | POA: Diagnosis not present

## 2020-09-02 DIAGNOSIS — Z952 Presence of prosthetic heart valve: Secondary | ICD-10-CM | POA: Diagnosis not present

## 2020-09-02 DIAGNOSIS — S72002D Fracture of unspecified part of neck of left femur, subsequent encounter for closed fracture with routine healing: Secondary | ICD-10-CM | POA: Diagnosis not present

## 2020-09-02 DIAGNOSIS — R509 Fever, unspecified: Secondary | ICD-10-CM | POA: Diagnosis not present

## 2020-09-02 DIAGNOSIS — R55 Syncope and collapse: Secondary | ICD-10-CM | POA: Diagnosis not present

## 2020-09-02 DIAGNOSIS — S72411D Displaced unspecified condyle fracture of lower end of right femur, subsequent encounter for closed fracture with routine healing: Secondary | ICD-10-CM | POA: Diagnosis not present

## 2020-09-02 DIAGNOSIS — M25561 Pain in right knee: Secondary | ICD-10-CM | POA: Diagnosis not present

## 2020-09-02 DIAGNOSIS — R109 Unspecified abdominal pain: Secondary | ICD-10-CM | POA: Diagnosis not present

## 2020-09-02 DIAGNOSIS — Z79899 Other long term (current) drug therapy: Secondary | ICD-10-CM | POA: Diagnosis not present

## 2020-09-02 DIAGNOSIS — R1031 Right lower quadrant pain: Secondary | ICD-10-CM | POA: Diagnosis not present

## 2020-09-02 DIAGNOSIS — M25552 Pain in left hip: Secondary | ICD-10-CM | POA: Diagnosis not present

## 2020-09-02 DIAGNOSIS — M25562 Pain in left knee: Secondary | ICD-10-CM | POA: Diagnosis not present

## 2020-09-02 DIAGNOSIS — R112 Nausea with vomiting, unspecified: Secondary | ICD-10-CM | POA: Diagnosis not present

## 2020-09-02 DIAGNOSIS — G3184 Mild cognitive impairment, so stated: Secondary | ICD-10-CM | POA: Diagnosis not present

## 2020-09-02 DIAGNOSIS — R110 Nausea: Secondary | ICD-10-CM | POA: Diagnosis not present

## 2020-09-02 DIAGNOSIS — S72412D Displaced unspecified condyle fracture of lower end of left femur, subsequent encounter for closed fracture with routine healing: Secondary | ICD-10-CM | POA: Diagnosis not present

## 2020-09-02 DIAGNOSIS — N393 Stress incontinence (female) (male): Secondary | ICD-10-CM | POA: Diagnosis not present

## 2020-09-02 DIAGNOSIS — I639 Cerebral infarction, unspecified: Secondary | ICD-10-CM | POA: Diagnosis not present

## 2020-09-02 DIAGNOSIS — R350 Frequency of micturition: Secondary | ICD-10-CM | POA: Diagnosis not present

## 2020-09-02 DIAGNOSIS — R4182 Altered mental status, unspecified: Secondary | ICD-10-CM | POA: Diagnosis not present

## 2020-09-02 DIAGNOSIS — M5481 Occipital neuralgia: Secondary | ICD-10-CM | POA: Diagnosis not present

## 2020-09-02 DIAGNOSIS — N390 Urinary tract infection, site not specified: Secondary | ICD-10-CM | POA: Diagnosis not present

## 2020-09-02 DIAGNOSIS — K838 Other specified diseases of biliary tract: Secondary | ICD-10-CM | POA: Diagnosis not present

## 2020-09-02 DIAGNOSIS — I959 Hypotension, unspecified: Secondary | ICD-10-CM | POA: Diagnosis not present

## 2020-09-02 DIAGNOSIS — Z87440 Personal history of urinary (tract) infections: Secondary | ICD-10-CM | POA: Diagnosis not present

## 2020-09-02 DIAGNOSIS — B0089 Other herpesviral infection: Secondary | ICD-10-CM | POA: Diagnosis not present

## 2020-09-02 DIAGNOSIS — N10 Acute tubulo-interstitial nephritis: Secondary | ICD-10-CM | POA: Diagnosis not present

## 2020-09-02 DIAGNOSIS — I350 Nonrheumatic aortic (valve) stenosis: Secondary | ICD-10-CM | POA: Diagnosis not present

## 2020-09-02 DIAGNOSIS — I10 Essential (primary) hypertension: Secondary | ICD-10-CM | POA: Diagnosis not present

## 2020-09-02 DIAGNOSIS — M79652 Pain in left thigh: Secondary | ICD-10-CM | POA: Diagnosis not present

## 2020-09-02 DIAGNOSIS — E569 Vitamin deficiency, unspecified: Secondary | ICD-10-CM | POA: Diagnosis not present

## 2020-09-02 NOTE — TOC Transition Note (Signed)
Transition of Care [facility]) - CM/SW Discharge Note   Patient Details  Name: Terri King MRN: [id number] Date of Birth: May 09, 1934  [facility] CM/SW Contact:  Eduard Roux, LCSW Phone Number: 09/02/2020, 12:33 PM   Clinical Narrative:     Patient will Discharge to: [facility] Discharge Date:09/02/2020 Family Notified: Patrick,son Transport By: Sharin Mons  Per MD patient is ready for discharge. RN, patient, and facility notified of discharge. Discharge Summary sent to facility. RN given number for [phone],Room 402. Ambulance transport requested for patient.   Clinical Social Worker signing off.  Antony Blackbird, MSW, LCSW Clinical Social Worker     Final next level of care: Skilled Nursing Facility Barriers to Discharge: Barriers Resolved   Patient Goals and CMS Choice        Discharge Placement              Patient chooses bed at: [facility] Patient to be transferred to facility by: [facility] Name of family member notified: son Patient and family notified of of transfer: 09/02/20  Discharge Plan and Services                                     Social Determinants of Health (SDOH) Interventions     Readmission Risk Interventions No flowsheet data found.

## 2020-09-02 NOTE — Care Management Important Message (Signed)
Important Message  Patient Details  Name: Terri King MRN: [id number] Date of Birth: 1934-04-19   Medicare Important Message Given:  Yes     Renie Ora 09/02/2020, 9:07 AM

## 2020-09-02 NOTE — Progress Notes (Signed)
IVs removed. Report called to [facility] RN. Discharge instructions sent with [facility].   Allegra Grana RN

## 2020-09-02 NOTE — Progress Notes (Signed)
Patient seen and examined the bedside this morning.  Hemodynamically stable.  Sitting on the chair.  Complains of some nausea today. There is no change to medical management.  She is medically stable for discharge to skilled nursing  facility as soon as bed is available.  Discharge orders and summary are in place.

## 2020-09-02 NOTE — Progress Notes (Signed)
Occupational Therapy Treatment Patient Details Name: NANNIE STARZYK MRN: [id number] DOB: [birth year] Today's Date: 09/02/2020    History of present illness 86 year old female who was admitted after being found on the floor by family. patient was noted to have had syncopal episode.  found to have left lateral femoral condyle fracture, ortho was consulted, Dr.Lucey recommending knee immoblizer and touch down weightbearing. CT of head was negative. Patient transferred to [facility] 7/6. MRI + tiny left occipital subcortical. Pt underwent TAVR on 7/12.  PMH was significant for MI, OA, L TKA, CAD.   OT comments  Pt progressing well towards OT goals, remains motivated to participate. Session focused on [facility] transfers and toileting tasks. Pt able to complete squat pivot transfer more successfully than manuvering RW for a pivot. Pt overall Mod A x 2 for squat pivots during session with consistent cues for WB precautions and sequencing. Encouraged continued practice of lateral leans and compensatory strategies for toileting hygiene. Continue to recommend SNF rehab at [place].    Follow Up Recommendations  SNF    Equipment Recommendations  Tub/shower bench;3 in 1 bedside commode;Wheelchair (measurements OT);Wheelchair cushion (measurements OT)    Recommendations for Other Services      Precautions / Restrictions Precautions Precautions: Fall Required Braces or Orthoses: Knee Immobilizer - Left Knee Immobilizer - Left: On at all times Restrictions Weight Bearing Restrictions: Yes LLE Weight Bearing: Touchdown weight bearing       Mobility Bed Mobility Overal bed mobility: Needs Assistance Bed Mobility: Supine to Sit     Supine to sit: Mod assist;HOB elevated     General bed mobility comments: Mod A to sit EOB with assist to scoot hips to EOB (going to L side but hips close to R side) and advance L LE    Transfers Overall transfer level: Needs assistance Equipment used: Rolling walker (2 wheeled);2  person hand held assist Transfers: Sit to/from [employer] Sit to Stand: Mod assist;+2 physical assistance   Squat pivot transfers: Mod assist;+2 physical assistance;+2 safety/equipment     General transfer comment: Mod A x 2 for sit to stand from bedside with RW, unable to maintain standing long or sequence RW for pivot to [facility]. Opted for Mod A x 2 squat pivot to [facility] and then to recliner, transferring to R side each time. cues for hand placement and WB consistently needed    Balance Overall balance assessment: Needs assistance Sitting-balance support: Feet supported;No upper extremity supported Sitting balance-Leahy Scale: Fair     Standing balance support: Bilateral upper extremity supported Standing balance-Leahy Scale: Poor Standing balance comment: Reliant on UE support due to WB precautions + external support                           ADL either performed or assessed with clinical judgement   ADL Overall ADL's : Needs assistance/impaired                       Lower Body Dressing Details (indicate cue type and reason): Able to reach down to pull up R sock, will need assist around affected L LE Toilet Transfer: Moderate assistance;+2 for physical assistance;+2 for safety/equipment;Squat-pivot;BSC Toilet Transfer Details (indicate cue type and reason): Mod A x 2 for squat pivot to BSC, cues for WB precautions needed and hand placement Toileting- Clothing Manipulation and Hygiene: Moderate assistance;Sitting/lateral lean;+2 for physical assistance;+2 for safety/equipment;Sit to/from stand Toileting - Architect Details (  indicate cue type and reason): Able to perform peri care anteriorly seated on BSC. would need increased assist in standing       General ADL Comments: Session focused on [facility] transfer training with difficulty adhering to WB precautions. Pt does better with manual assist rather than RW     Vision   Vision Assessment?:  No apparent visual deficits   Perception     Praxis      Cognition Arousal/Alertness: Awake/alert Behavior During Therapy: WFL for tasks assessed/performed Overall Cognitive Status: Impaired/Different from baseline Area of Impairment: Awareness;Problem solving;Memory                     Memory: Decreased recall of precautions;Decreased short-term memory Following Commands: Follows one step commands with increased time;Follows multi-step commands with increased time   Awareness: Emergent Problem Solving: Slow processing;Difficulty sequencing;Requires verbal cues General Comments: Pt able to verbally recall WB precautions but difficulty implementing during tasks, increased time to follow directions/problem solve. very pleasant and motivated to participate        Exercises     Shoulder Instructions       General Comments VSS on RA. (HR 60s-70s)    Pertinent Vitals/ Pain       Pain Assessment: Faces Faces Pain Scale: Hurts little more Pain Location: L LE in standing Pain Descriptors / Indicators: Grimacing;Guarding Pain Intervention(s): Monitored during session;Limited activity within patient's tolerance;Premedicated before session  Home Living                                          Prior Functioning/Environment              Frequency  Min 2X/week        Progress Toward Goals  OT Goals(current goals can now be found in the care plan section)  Progress towards OT goals: Progressing toward goals  Acute Rehab OT Goals Patient Stated Goal: get back to prior level OT Goal Formulation: With patient Time For Goal Achievement: 09/05/20 Potential to Achieve Goals: Good ADL Goals Pt Will Perform Lower Body Dressing: with min assist Pt Will Transfer to Toilet: with min assist;stand pivot transfer;bedside commode Pt Will Perform Toileting - Clothing Manipulation and hygiene: sitting/lateral leans;with supervision  Plan Discharge plan  remains appropriate    Co-evaluation                 AM-PAC OT "6 Clicks" Daily Activity     Outcome Measure   Help from another person eating meals?: None Help from another person taking care of personal grooming?: A Little Help from another person toileting, which includes using toliet, bedpan, or urinal?: A Lot Help from another person bathing (including washing, rinsing, drying)?: A Lot Help from another person to put on and taking off regular upper body clothing?: A Little Help from another person to put on and taking off regular lower body clothing?: A Lot 6 Click Score: 16    End of Session Equipment Utilized During Treatment: Gait belt;Rolling walker;Left knee immobilizer  OT Visit Diagnosis: Pain;Muscle weakness (generalized) (M62.81) Pain - Right/Left: Left Pain - part of body: Leg   Activity Tolerance Patient tolerated treatment well   Patient Left in chair;with call bell/phone within reach;with chair alarm set   Nurse Communication Mobility status        Time: 0350-0938 OT Time Calculation (min): 24 min  Charges: OT  General Charges $OT Visit: 1 Visit OT Treatments $Self Care/Home Management : 8-22 mins $Therapeutic Activity: 8-22 mins  Bradd Canary, OTR/L Acute Rehab Services Office: [phone]    Lorre Munroe 09/02/2020, 10:41 AM

## 2020-09-06 ENCOUNTER — Telehealth (HOSPITAL_COMMUNITY): Payer: Self-pay

## 2020-09-06 DIAGNOSIS — S72412D Displaced unspecified condyle fracture of lower end of left femur, subsequent encounter for closed fracture with routine healing: Secondary | ICD-10-CM | POA: Diagnosis not present

## 2020-09-06 DIAGNOSIS — Z952 Presence of prosthetic heart valve: Secondary | ICD-10-CM | POA: Diagnosis not present

## 2020-09-06 DIAGNOSIS — I10 Essential (primary) hypertension: Secondary | ICD-10-CM | POA: Diagnosis not present

## 2020-09-06 DIAGNOSIS — R55 Syncope and collapse: Secondary | ICD-10-CM | POA: Diagnosis not present

## 2020-09-06 NOTE — Telephone Encounter (Signed)
Attempted to call patient in regards to Cardiac Rehab - LM on VM 

## 2020-09-06 NOTE — Telephone Encounter (Signed)
Pt insurance is active and benefits verified through [employer] $10.00, DED 0/0 met, out of pocket $3,900/$290 met, co-insurance 0%. no pre-authorization required. Passport, 09/06/2020'@11' :10am, REF# [id number]   Will contact patient to see if she is interested in the Cardiac Rehab Program. If interested, patient will need to complete follow up appt. Once completed, patient will be contacted for scheduling upon review by the RN Navigator.

## 2020-09-07 DIAGNOSIS — S72002D Fracture of unspecified part of neck of left femur, subsequent encounter for closed fracture with routine healing: Secondary | ICD-10-CM | POA: Diagnosis not present

## 2020-09-07 DIAGNOSIS — K5903 Drug induced constipation: Secondary | ICD-10-CM | POA: Diagnosis not present

## 2020-09-07 DIAGNOSIS — M25552 Pain in left hip: Secondary | ICD-10-CM | POA: Diagnosis not present

## 2020-09-07 DIAGNOSIS — N10 Acute tubulo-interstitial nephritis: Secondary | ICD-10-CM | POA: Diagnosis not present

## 2020-09-07 DIAGNOSIS — R509 Fever, unspecified: Secondary | ICD-10-CM | POA: Diagnosis not present

## 2020-09-08 DIAGNOSIS — T7840XA Allergy, unspecified, initial encounter: Secondary | ICD-10-CM | POA: Diagnosis not present

## 2020-09-08 DIAGNOSIS — N390 Urinary tract infection, site not specified: Secondary | ICD-10-CM | POA: Diagnosis not present

## 2020-09-08 DIAGNOSIS — I10 Essential (primary) hypertension: Secondary | ICD-10-CM | POA: Diagnosis not present

## 2020-09-08 DIAGNOSIS — S72002D Fracture of unspecified part of neck of left femur, subsequent encounter for closed fracture with routine healing: Secondary | ICD-10-CM | POA: Diagnosis not present

## 2020-09-08 DIAGNOSIS — R55 Syncope and collapse: Secondary | ICD-10-CM | POA: Diagnosis not present

## 2020-09-08 DIAGNOSIS — N10 Acute tubulo-interstitial nephritis: Secondary | ICD-10-CM | POA: Diagnosis not present

## 2020-09-09 ENCOUNTER — Telehealth: Payer: Self-pay | Admitting: Internal Medicine

## 2020-09-09 DIAGNOSIS — Z87440 Personal history of urinary (tract) infections: Secondary | ICD-10-CM | POA: Diagnosis not present

## 2020-09-09 DIAGNOSIS — R300 Dysuria: Secondary | ICD-10-CM | POA: Diagnosis not present

## 2020-09-09 DIAGNOSIS — R109 Unspecified abdominal pain: Secondary | ICD-10-CM | POA: Diagnosis not present

## 2020-09-09 NOTE — Telephone Encounter (Signed)
Spoke with facility supervisor, Margaretha Glassing. She states the original caller, Britta Mccreedy, is the patient transporter and thought the phone call was to confirm pt's follow up appointment next week. I was able to speak with Ms Wesenberg personally. She denies any recent chest pain and states she is feeling better after her TAVR. She was advised to make her nurse or CNA aware if she is having CP asap. She verbalized understanding.   Upcoming appointments were confirmed with facility supervisor.

## 2020-09-09 NOTE — Telephone Encounter (Signed)
Pt c/o of Chest Pain: STAT if CP now or developed within 24 hours  1. Are you having CP right now?  Britta Mccreedy is calling from [employer] and she's not sure if pt is currently still having CP  2. Are you experiencing any other symptoms (ex. SOB, nausea, vomiting, sweating)? Britta Mccreedy is not sure  3. How long have you been experiencing CP? Britta Mccreedy found out yesterday  4. Is your CP continuous or coming and going? Britta Mccreedy is not sure  5. Have you taken Nitroglycerin? Britta Mccreedy is not sure if pt took [employer] is at [facility] living facility, Britta Mccreedy is currently in another building not near the pt [employer]/gallery exhibitions officer) [phone] Margaretha Glassing (Supervisor) [phone]  ?

## 2020-09-12 NOTE — Progress Notes (Signed)
[facility]                                     Cardiology Office Note:    Date:  09/14/2020   ID:  Terri King, DOB 04-17-1934, MRN [id number]  PCP:  Cassandria Anger, MD  [facility] Cardiologist:  Dorris Carnes, MD / Dr. Angelena Form & Dr. Roxy Manns (TAVR) [facility] Electrophysiologist:  None   Referring MD: Cassandria Anger, MD   [facility] s/p TAVR  History of Present Illness:    Terri King is a 86 y.o. female with a hx of HTN, HLD, CVA by imaging, syncope with resultant femur fracture and severe AS s/p TAVR (08/30/20) who presents to clinic for follow up.    She was recently admitted 7/1-7/15/22 for exertional syncope (climbing up stairs) resulting in femur fracture (ortho recommended conservative management). Further work-up was commenced regarding her syncope, falling, lethargy/weakness, and confusion as per family. She was found to be on multiple over sedating medications including temazepam, gabapentin, and Seroquel which were weaned off. Repeat echo was also ordered and revealed progression of her aortic stenosis to severe. MRI brain also obtained which showed a 5 mm acute cortical infarct within the left occipital lobe and tiny chronic lacunar infarcts within the left cerebellar hemisphere. Neurology was consulted and felt this was likely just an incidental finding. L/RHC showed minimal non obst CAD. Structural heart was consulted given her severe AS and syncope. The patient was evaluated by the multidisciplinary valve team and underwent successful TAVR with a 23 mm Medtronic Evolut Pro+ THV via the TF approach on 08/30/20. Post operative echo showed EF 60%, normally functioning TAVR with a mean gradient of 11 mmHg and trivial central AI. She was discharged to a SNF on aspirin and plavix.   Today the patient presents to clinic for follow up. She is currently non weight bearing still on account of her femur fracture. She says  her Dr said it will be a few months before she can walk. One night she had an ache in her chest that was short lived and then she fell asleep. There has been no recurrence. Since her valve surgery she has had less "fluttering" in her chest and overall just feels better.    Past Medical History:  Diagnosis Date   Family history of anesthesia complication    NIENCE had sensitivity   History of pneumonia 2008   HTN (hypertension)    dr plotnikov   Hyperlipemia    Osteoarthritis    Severe aortic stenosis     Past Surgical History:  Procedure Laterality Date   JOINT REPLACEMENT  12/13   L TKR    KNEE ARTHROSCOPY     left   PARTIAL HYSTERECTOMY     RIGHT HEART CATH AND CORONARY ANGIOGRAPHY N/A 08/24/2020   Procedure: RIGHT HEART CATH AND CORONARY ANGIOGRAPHY;  Surgeon: Leonie Man, MD;  Location: [place] [facility];  Service: Cardiovascular;  Laterality: N/A;   TEE WITHOUT CARDIOVERSION N/A 08/30/2020   Procedure: TRANSESOPHAGEAL ECHOCARDIOGRAM (TEE);  Surgeon: Burnell Blanks, MD;  Location: [place] [facility];  Service: Open Heart Surgery;  Laterality: N/A;   TONSILLECTOMY     TOTAL KNEE ARTHROPLASTY  01/23/2012   Procedure: TOTAL KNEE ARTHROPLASTY;  Surgeon: Ninetta Lights, MD;  Location: [place];  Service: Orthopedics;  Laterality:  Left;   TOTAL KNEE ARTHROPLASTY  12/'05/2011   left knee   TRANSCATHETER AORTIC VALVE REPLACEMENT, TRANSFEMORAL N/A 08/30/2020   Procedure: TRANSCATHETER AORTIC VALVE REPLACEMENT, TRANSFEMORAL;  Surgeon: Burnell Blanks, MD;  Location: [place] [facility];  Service: Open Heart Surgery;  Laterality: N/A;    Current Medications: Current Meds  Medication Sig   Alcohol Swabs (B-D SINGLE USE SWABS REGULAR) PADS 1 each by Does not apply route as directed. Dx: R73.9   amLODipine (NORVASC) 5 MG tablet TAKE 1 TABLET EVERY DAY   amoxicillin (AMOXIL) 500 MG tablet Take FOUR TABLETS (2000 mg) by mouth ONE HOUR before any dental procedures    Ascorbic Acid (VITAMIN C PO) Take 1 tablet by mouth daily.   aspirin EC 81 MG EC tablet Take 1 tablet (81 mg total) by mouth daily. Swallow whole.   Blood Glucose Calibration (TRUE METRIX LEVEL 1) Low SOLN 1 each by In Vitro route as directed. Dx: R73.9   Blood Glucose Monitoring Suppl (TRUE METRIX METER) w/Device KIT USE AS DIRECTED   cholecalciferol (VITAMIN D) 400 UNITS TABS Take 400 Units by mouth daily.   clopidogrel (PLAVIX) 75 MG tablet Take 1 tablet (75 mg total) by mouth daily with breakfast.   escitalopram (LEXAPRO) 10 MG tablet Take 1 tablet (10 mg total) by mouth daily.   gabapentin (NEURONTIN) 100 MG capsule TAKE 1 CAPSULE THREE TIMES DAILY   glucose blood (TRUE METRIX BLOOD GLUCOSE TEST) test strip Use to check blood sugars twice a day   HYDROcodone-acetaminophen (NORCO/VICODIN) 5-325 MG tablet Take 1 tablet by mouth every 6 (six) hours as needed for moderate pain or severe pain.   ibuprofen (IBU) 600 MG tablet TAKE 1 TABLET EVERY DAY AS NEEDED FOR MODERATE PAIN   Krill Oil (MAXIMUM RED KRILL) 300 MG CAPS 1 po qd   metoprolol succinate (TOPROL-XL) 50 MG 24 hr tablet TAKE 1 TABLET (50 MG TOTAL) BY MOUTH AT BEDTIME / WITH OR IMMEDIATELY FOLLOWING A MEAL AS DIRECTED   Multiple Vitamins-Minerals (MULTIPLE VITAMINS/WOMENS PO) Take by mouth daily.    nitroGLYCERIN (NITROSTAT) 0.4 MG SL tablet Place 1 tablet (0.4 mg total) under the tongue every 5 (five) minutes as needed for chest pain.   potassium chloride SA (KLOR-CON) 20 MEQ tablet Take 20 mEq by mouth daily.   QUEtiapine (SEROQUEL) 25 MG tablet Take 1 tablet (25 mg total) by mouth at bedtime.   REPATHA SURECLICK 700 MG/ML SOAJ INJECT 1 PEN INTO THE SKIN EVERY 14 DAYS   temazepam (RESTORIL) 15 MG capsule Take 1 capsule (15 mg total) by mouth at bedtime.   TRUEplus Lancets 33G MISC Use to check blood sugars twice a day   valACYclovir (VALTREX) 1000 MG tablet Take 1 tablet (1,000 mg total) by mouth 3 (three) times daily.   vitamin B-12  (CYANOCOBALAMIN) 100 MCG tablet Take 100 mcg by mouth daily.   Vitamin E 400 units TABS Take by mouth daily.      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)  Tobacco Use   Smoking status: Never   Smokeless tobacco: Never  Vaping Use  Vaping Use: Never used  Substance and Sexual Activity   Alcohol use: No   Drug use: No   Sexual activity: Never  Other Topics Concern   Not on file  Social History Narrative   Not on file   Social Determinants of Health   Financial Resource Strain: Low Risk    Difficulty of Paying Living Expenses: Not hard at all  Food Insecurity: No Food Insecurity   Worried About Charity fundraiser in the Last Year: Never true   [place] in the Last Year: Never true  Transportation Needs: No Transportation Needs   Lack of Transportation (Medical): No   Lack of Transportation (Non-Medical): No  Physical Activity: Insufficiently Active   Days of Exercise per Week: 7 days   Minutes of Exercise per Session: 20 min  Stress: No Stress Concern Present   Feeling of Stress : Not at all  Social Connections: Moderately Integrated   Frequency of Communication with Friends and Family: More than three times a week   Frequency of Social Gatherings with Friends and Family: Once a week   Attends Religious Services: 1 to 4 times per year   Active Member of [employer] or Organizations: No   Attends Archivist Meetings: 1 to 4 times per year   Marital Status: Widowed     Family History: The patient's family history includes Glaucoma in her father; Hypertension in her mother and  another family member.  ROS:   Please see the history of present illness.    All other systems reviewed and are negative.  EKGs/Labs/Other Studies Reviewed:    The following studies were reviewed today:  TAVR OPERATIVE NOTE     Date of Procedure:                08/30/2020   Preoperative Diagnosis:      Severe Aortic Stenosis   Postoperative Diagnosis:    Same   Procedure:        Transcatheter Aortic Valve Replacement - Percutaneous Right Transfemoral Approach             Medtronic CoreValve Evolut ProPlus (size 23 mm, serial # [id number])              Co-Surgeons:                        Lauree Chandler, MD and Valentina Gu. Roxy Manns, MD   Anesthesiologist:                  Myrtie Soman, MD   Echocardiographer:              Jenkins Rouge, MD   Pre-operative Echo Findings: Severe aortic stenosis Normal left ventricular systolic function   Post-operative Echo Findings: Mild paravalvular leak Normal left ventricular systolic function  ____________________  Echo 08/31/20 IMPRESSIONS   1. Left ventricular ejection fraction, by estimation, is 60 to 65%. The  left ventricle has normal function. The left ventricle has no regional  wall motion abnormalities. Left ventricular diastolic parameters are  indeterminate.   2. Right ventricular systolic function is normal. The right ventricular  size is normal. There is normal pulmonary artery systolic pressure.   3. Left atrial size was moderately dilated.   4. The mitral valve is degenerative. Mild mitral valve regurgitation. No  evidence of mitral stenosis. Severe mitral annular calcification.   5. Well placed 21 mm Medtronic Evolut Pro valve Trivial central aortic  regurgitation  mean gradient 11 peak 26.5 mmHg AVA 1.8 cm2 . The aortic  valve has been repaired/replaced. Aortic valve regurgitation is not  visualized. No aortic stenosis is present.  Procedure Date: 08/30/2020.   6. The inferior vena cava is normal in size with greater  than 50%  respiratory variability, suggesting right atrial pressure of 3 mmHg.    EKG:  EKG is ordered today.  The ekg ordered today demonstrates sinus HR 68   Recent Labs: 08/23/2020: TSH 1.542 08/29/2020: ALT 32 08/31/2020: BUN 18; Creatinine, Ser 0.86; Magnesium 2.1; Potassium 3.5; Sodium 135 09/01/2020: Hemoglobin 11.1; Platelets 224  Recent Lipid Panel    Component Value Date/Time   CHOL 153 08/23/2020 1427   TRIG 144 08/23/2020 1427   HDL 41 08/23/2020 1427   CHOLHDL 3.7 08/23/2020 1427   VLDL 29 08/23/2020 1427   LDLCALC 83 08/23/2020 1427   LDLDIRECT 186.1 03/24/2013 0923     Risk Assessment/Calculations:       Physical Exam:    VS:  BP (!) 150/60   Pulse 68   Ht '5\' 3"'  (1.6 m)   Wt 155 lb (70.3 kg)   SpO2 97%   BMI 27.46 kg/m     Wt Readings from Last 3 Encounters:  09/14/20 155 lb (70.3 kg)  09/02/20 154 lb 12.2 oz (70.2 kg)  05/31/20 164 lb 12.8 oz (74.8 kg)     GEN:  Well nourished, well developed in no acute distress, in a wheelchair HEENT: Normal NECK: No JVD LYMPHATICS: No lymphadenopathy CARDIAC: RRR, soft flow murmur. No rubs, gallops RESPIRATORY:  Clear to auscultation without rales, wheezing or rhonchi  ABDOMEN: Soft, non-tender, non-distended MUSCULOSKELETAL:  No edema; left leg in brace SKIN: Warm and dry.  Groin sites clear without hematoma or ecchymosis  NEUROLOGIC:  Alert and oriented x 3 PSYCHIATRIC:  Normal affect   ASSESSMENT:    1. S/P TAVR (transcatheter aortic valve replacement)   2. Essential hypertension   3. Hyperlipidemia, unspecified hyperlipidemia type   4. Closed displaced fracture of condyle of right femur with routine healing, subsequent encounter    PLAN:    In order of problems listed above:  Severe AS s/p TAVR: doing well. ECG with no HAVB. Groin sites healing well. Continue on aspirin and plavix. SBE prophylaxis discussed; I have RX'd amoxicillin. I will see her back next month for follow up and echo.  HTN: BP  150/50 today. Says she has white coat HTN. Will have her keep a log of BPs at [facility] and I will review next visit.   HLD: continue Repatha  Traumatic femur fracture: still non weight bearing.     Cardiac Rehabilitation Eligibility Assessment: The patient is NOT ready to start Cardiac Rehab due to femur fracture and not able to weight bear at this time.    Medication Adjustments/Labs and Tests Ordered: Current medicines are reviewed at length with the patient today.  Concerns regarding medicines are outlined above.  Orders Placed This Encounter  Procedures   EKG 12-Lead    Meds ordered this encounter  Medications   amoxicillin (AMOXIL) 500 MG tablet    Sig: Take FOUR TABLETS (2000 mg) by mouth ONE HOUR before any dental procedures    Dispense:  12 tablet    Refill:  2     Patient Instructions  Medication Instructions:  Your physician has recommended you make the following change in your medication:   1.) amoxicillin 500 mg - take FOUR tablets by mouth ONE HOUR before  any dental procedures.   *If you need a refill on your cardiac medications before your next appointment, please call your pharmacy*   Lab Work: none If you have labs (blood work) drawn today and your tests are completely normal, you will receive your results only by: [place] (if you have MyChart) OR A paper copy in the mail If you have any lab test that is abnormal or we need to change your treatment, we will call you to review the results.   Testing/Procedures: Echo as scheduled.   Follow-Up: As scheduled.  Other Instructions     Signed, Angelena Form, PA-C  09/14/2020 2:25 PM    [facility]

## 2020-09-13 ENCOUNTER — Telehealth: Payer: Self-pay | Admitting: Internal Medicine

## 2020-09-13 NOTE — Telephone Encounter (Signed)
Terri King from [facility] calling in regards to the patient's surgery that was scheduled 7/12. She states the hospital says there was no admit. She states it was possibly for a watchman or valve replacement. Phone: [phone]

## 2020-09-14 ENCOUNTER — Encounter: Payer: Self-pay | Admitting: Physician Assistant

## 2020-09-14 ENCOUNTER — Ambulatory Visit (INDEPENDENT_AMBULATORY_CARE_PROVIDER_SITE_OTHER): Payer: Medicare HMO | Admitting: Physician Assistant

## 2020-09-14 ENCOUNTER — Other Ambulatory Visit: Payer: Self-pay

## 2020-09-14 VITALS — BP 150/60 | HR 68 | Ht 63.0 in | Wt 155.0 lb

## 2020-09-14 DIAGNOSIS — I10 Essential (primary) hypertension: Secondary | ICD-10-CM | POA: Diagnosis not present

## 2020-09-14 DIAGNOSIS — Z952 Presence of prosthetic heart valve: Secondary | ICD-10-CM

## 2020-09-14 DIAGNOSIS — E785 Hyperlipidemia, unspecified: Secondary | ICD-10-CM

## 2020-09-14 DIAGNOSIS — S72411D Displaced unspecified condyle fracture of lower end of right femur, subsequent encounter for closed fracture with routine healing: Secondary | ICD-10-CM

## 2020-09-14 MED ORDER — AMOXICILLIN 500 MG PO TABS: ORAL_TABLET | ORAL | 2 refills | Status: AC

## 2020-09-14 NOTE — Telephone Encounter (Signed)
Left detailed message for Terri King in regards to TAVR being performed during inpatient admission 7/1-7/15. If she had any additional questions to return my call.

## 2020-09-14 NOTE — Patient Instructions (Signed)
Medication Instructions:  Your physician has recommended you make the following change in your medication:   1.) amoxicillin 500 mg - take FOUR tablets by mouth ONE HOUR before any dental procedures.   *If you need a refill on your cardiac medications before your next appointment, please call your pharmacy*   Lab Work: none If you have labs (blood work) drawn today and your tests are completely normal, you will receive your results only by: MyChart Message (if you have MyChart) OR A paper copy in the mail If you have any lab test that is abnormal or we need to change your treatment, we will call you to review the results.   Testing/Procedures: Echo as scheduled.   Follow-Up: As scheduled.  Other Instructions

## 2020-09-15 DIAGNOSIS — R109 Unspecified abdominal pain: Secondary | ICD-10-CM | POA: Diagnosis not present

## 2020-09-15 DIAGNOSIS — R350 Frequency of micturition: Secondary | ICD-10-CM | POA: Diagnosis not present

## 2020-09-15 DIAGNOSIS — Z87440 Personal history of urinary (tract) infections: Secondary | ICD-10-CM | POA: Diagnosis not present

## 2020-09-15 DIAGNOSIS — R110 Nausea: Secondary | ICD-10-CM | POA: Diagnosis not present

## 2020-09-15 DIAGNOSIS — R112 Nausea with vomiting, unspecified: Secondary | ICD-10-CM | POA: Diagnosis not present

## 2020-09-16 DIAGNOSIS — M79652 Pain in left thigh: Secondary | ICD-10-CM | POA: Diagnosis not present

## 2020-09-20 ENCOUNTER — Other Ambulatory Visit: Payer: Self-pay | Admitting: Orthopedic Surgery

## 2020-09-20 DIAGNOSIS — M25562 Pain in left knee: Secondary | ICD-10-CM

## 2020-09-20 DIAGNOSIS — N390 Urinary tract infection, site not specified: Secondary | ICD-10-CM | POA: Diagnosis not present

## 2020-09-21 DIAGNOSIS — R109 Unspecified abdominal pain: Secondary | ICD-10-CM | POA: Diagnosis not present

## 2020-09-21 DIAGNOSIS — Z87440 Personal history of urinary (tract) infections: Secondary | ICD-10-CM | POA: Diagnosis not present

## 2020-09-23 DIAGNOSIS — M79652 Pain in left thigh: Secondary | ICD-10-CM | POA: Diagnosis not present

## 2020-09-29 ENCOUNTER — Other Ambulatory Visit: Payer: Self-pay

## 2020-09-29 ENCOUNTER — Ambulatory Visit (HOSPITAL_COMMUNITY): Payer: Medicare HMO | Attending: Cardiology

## 2020-09-29 ENCOUNTER — Encounter: Payer: Self-pay | Admitting: Physician Assistant

## 2020-09-29 ENCOUNTER — Ambulatory Visit (INDEPENDENT_AMBULATORY_CARE_PROVIDER_SITE_OTHER): Payer: Medicare HMO | Admitting: Physician Assistant

## 2020-09-29 VITALS — BP 150/58 | HR 74 | Ht 63.0 in | Wt 155.0 lb

## 2020-09-29 DIAGNOSIS — R109 Unspecified abdominal pain: Secondary | ICD-10-CM | POA: Diagnosis not present

## 2020-09-29 DIAGNOSIS — Z87440 Personal history of urinary (tract) infections: Secondary | ICD-10-CM | POA: Diagnosis not present

## 2020-09-29 DIAGNOSIS — I10 Essential (primary) hypertension: Secondary | ICD-10-CM | POA: Diagnosis not present

## 2020-09-29 DIAGNOSIS — S72411D Displaced unspecified condyle fracture of lower end of right femur, subsequent encounter for closed fracture with routine healing: Secondary | ICD-10-CM | POA: Diagnosis not present

## 2020-09-29 DIAGNOSIS — Z952 Presence of prosthetic heart valve: Secondary | ICD-10-CM

## 2020-09-29 DIAGNOSIS — E785 Hyperlipidemia, unspecified: Secondary | ICD-10-CM | POA: Diagnosis not present

## 2020-09-29 DIAGNOSIS — I350 Nonrheumatic aortic (valve) stenosis: Secondary | ICD-10-CM | POA: Insufficient documentation

## 2020-09-29 DIAGNOSIS — R300 Dysuria: Secondary | ICD-10-CM | POA: Diagnosis not present

## 2020-09-29 DIAGNOSIS — R110 Nausea: Secondary | ICD-10-CM

## 2020-09-29 LAB — ECHOCARDIOGRAM COMPLETE
AR max vel: 0.96 cm2
AV Area VTI: 1.22 cm2
AV Area mean vel: 1.09 cm2
AV Mean grad: 14 mmHg
AV Peak grad: 30.5 mmHg
Ao pk vel: 2.76 m/s
Area-P 1/2: 2.43 cm2
MV VTI: 1.22 cm2
P 1/2 time: 352 msec
S' Lateral: 1.5 cm

## 2020-09-29 MED ORDER — CLOPIDOGREL BISULFATE 75 MG PO TABS: 75.0000 mg | ORAL_TABLET | Freq: Every day | ORAL | 5 refills | Status: DC

## 2020-09-29 MED ORDER — ONDANSETRON HCL 4 MG PO TABS: 4.0000 mg | ORAL_TABLET | Freq: Three times a day (TID) | ORAL | 1 refills | Status: DC | PRN

## 2020-09-29 MED ORDER — AMLODIPINE BESYLATE 10 MG PO TABS: 10.0000 mg | ORAL_TABLET | Freq: Every day | ORAL | 1 refills | Status: DC

## 2020-09-29 NOTE — Progress Notes (Signed)
Can she get added on as a televisit or something   Feel bad about Nauseas

## 2020-09-29 NOTE — Patient Instructions (Addendum)
Medication Instructions:  Your physician has recommended you make the following change in your medication:  1.) stop Plavix on 03/02/2021 2.) increase amlodipine to 10 mg daily 3.) ondansetron (Zofran) 4 mg tab - take one tablet every 8 hours as needed for nausea  *If you need a refill on your cardiac medications before your next appointment, please call your pharmacy*   Lab Work: Pt needs to have urinalysis with culture for dysuria   Testing/Procedures: none   Follow-Up: At [facility], you and your health needs are our priority.  As part of our continuing mission to provide you with exceptional heart care, we have created designated Provider Care Teams.  These Care Teams include your primary Cardiologist (physician) and Advanced Practice Providers (APPs -  Physician Assistants and Nurse Practitioners) who all work together to provide you with the care you need, when you need it.  We recommend signing up for the patient portal called "MyChart".  Sign up information is provided on this After Visit Summary.  MyChart is used to connect with patients for Virtual Visits (Telemedicine).  Patients are able to view lab/test results, encounter notes, upcoming appointments, etc.  Non-urgent messages can be sent to your provider as well.   To learn more about what you can do with MyChart, go to [email or web address].    Your next appointment:   4 month(s) December 8 at 3:00 pm with Dr. Tenny Craw The format for your next appointment:   In Person  Provider:   You may see Dietrich Pates, MD or one of the following Advanced Practice Providers on your designated Care Team:   Tereso Newcomer, PA-C Chelsea Aus, [place]   Other Instructions

## 2020-09-29 NOTE — Progress Notes (Signed)
[facility] [place]                                     Cardiology Office Note:    Date:  09/29/2020   ID:  Terri King, DOB 10-17-1934, MRN [id number]  PCP:  Cassandria Anger, MD  [facility] Cardiologist:  Dorris Carnes, MD / Dr. Angelena Form & Dr. Roxy Manns (TAVR) [facility] Electrophysiologist:  None   Referring MD: Cassandria Anger, MD   1 month s/p TAVR  History of Present Illness:    Terri King is a 86 y.o. female with a hx of HTN, HLD, CVA by imaging, syncope with resultant femur fracture and severe AS s/p TAVR (08/30/20) who presents to clinic for follow up.    She was recently admitted 7/1-7/15/22 for exertional syncope (climbing up stairs) resulting in femur fracture (ortho recommended conservative management). Further work-up was commenced regarding her syncope, falling, lethargy/weakness, and confusion as per family. She was found to be on multiple over sedating medications including temazepam, gabapentin, and Seroquel which were weaned off. Repeat echo was also ordered and revealed progression of her aortic stenosis to severe. MRI brain also obtained which showed a 5 mm acute cortical infarct within the left occipital lobe and tiny chronic lacunar infarcts within the left cerebellar hemisphere. Neurology was consulted and felt this was likely just an incidental finding. L/RHC showed minimal non obst CAD. Structural heart was consulted given her severe AS and syncope. The patient was evaluated by the multidisciplinary valve team and underwent successful TAVR with a 23 mm Medtronic Evolut Pro+ THV via the TF approach on 08/30/20. Post operative echo showed EF 60%, normally functioning TAVR with a mean gradient of 11 mmHg and trivial central AI. She was discharged to a SNF on aspirin and plavix.   At follow up she was doing well but still in a wheelchair on account of her femur fracture. BP was elevated and she was told to keep a  log at the facility.   Today the patient presents to clinic for follow up. Doing okay. Overall just not feeling well. Complains of a "weird feeling" when she urinates and intermittent nausea, mostly at night. She is taking hydrocodone for leg. She is now able to weight bear. Does have some swelling on the left leg (same as injury). No dyspnea, orthopnea or PND. No dizziness or syncope.    Past Medical History:  Diagnosis Date   Family history of anesthesia complication    NIENCE had sensitivity   History of pneumonia 2008   HTN (hypertension)    dr plotnikov   Hyperlipemia    Osteoarthritis    Severe aortic stenosis     Past Surgical History:  Procedure Laterality Date   JOINT REPLACEMENT  12/13   L TKR    KNEE ARTHROSCOPY     left   PARTIAL HYSTERECTOMY     RIGHT HEART CATH AND CORONARY ANGIOGRAPHY N/A 08/24/2020   Procedure: RIGHT HEART CATH AND CORONARY ANGIOGRAPHY;  Surgeon: Leonie Man, MD;  Location: [facility];  Service: Cardiovascular;  Laterality: N/A;   TEE WITHOUT CARDIOVERSION N/A 08/30/2020   Procedure: TRANSESOPHAGEAL ECHOCARDIOGRAM (TEE);  Surgeon: Burnell Blanks, MD;  Location: [facility];  Service: Open Heart Surgery;  Laterality: N/A;   TONSILLECTOMY     TOTAL KNEE ARTHROPLASTY  01/23/2012  Procedure: TOTAL KNEE ARTHROPLASTY;  Surgeon: Ninetta Lights, MD;  Location: [facility];  Service: Orthopedics;  Laterality: Left;   TOTAL KNEE ARTHROPLASTY  12/'05/2011   left knee   TRANSCATHETER AORTIC VALVE REPLACEMENT, TRANSFEMORAL N/A 08/30/2020   Procedure: TRANSCATHETER AORTIC VALVE REPLACEMENT, TRANSFEMORAL;  Surgeon: Burnell Blanks, MD;  Location: [facility] [place] [facility];  Service: Open Heart Surgery;  Laterality: N/A;    Current Medications: Current Meds  Medication Sig   Alcohol Swabs (B-D SINGLE USE SWABS REGULAR) PADS 1 each by Does not apply route as directed. Dx: R73.9   amLODipine (NORVASC) 10 MG tablet Take 1 tablet (10 mg  total) by mouth daily.   amoxicillin (AMOXIL) 500 MG tablet Take FOUR TABLETS (2000 mg) by mouth ONE HOUR before any dental procedures   Ascorbic Acid (VITAMIN C PO) Take 1 tablet by mouth daily.   aspirin EC 81 MG EC tablet Take 1 tablet (81 mg total) by mouth daily. Swallow whole.   Blood Glucose Calibration (TRUE METRIX LEVEL 1) Low SOLN 1 each by In Vitro route as directed. Dx: R73.9   Blood Glucose Monitoring Suppl (TRUE METRIX METER) w/Device KIT USE AS DIRECTED   cholecalciferol (VITAMIN D) 400 UNITS TABS Take 400 Units by mouth daily.   escitalopram (LEXAPRO) 10 MG tablet Take 1 tablet (10 mg total) by mouth daily.   gabapentin (NEURONTIN) 100 MG capsule TAKE 1 CAPSULE THREE TIMES DAILY   glucose blood (TRUE METRIX BLOOD GLUCOSE TEST) test strip Use to check blood sugars twice a day   HYDROcodone-acetaminophen (NORCO/VICODIN) 5-325 MG tablet Take 1 tablet by mouth every 6 (six) hours as needed for moderate pain or severe pain.   ibuprofen (IBU) 600 MG tablet TAKE 1 TABLET EVERY DAY AS NEEDED FOR MODERATE PAIN   Krill Oil (MAXIMUM RED KRILL) 300 MG CAPS 1 po qd   metoprolol succinate (TOPROL-XL) 50 MG 24 hr tablet TAKE 1 TABLET (50 MG TOTAL) BY MOUTH AT BEDTIME / WITH OR IMMEDIATELY FOLLOWING A MEAL AS DIRECTED   Multiple Vitamins-Minerals (MULTIPLE VITAMINS/WOMENS PO) Take by mouth daily.    nitroGLYCERIN (NITROSTAT) 0.4 MG SL tablet Place 1 tablet (0.4 mg total) under the tongue every 5 (five) minutes as needed for chest pain.   ondansetron (ZOFRAN) 4 MG tablet Take 1 tablet (4 mg total) by mouth every 8 (eight) hours as needed for nausea or vomiting.   potassium chloride SA (KLOR-CON) 20 MEQ tablet Take 20 mEq by mouth daily.   QUEtiapine (SEROQUEL) 25 MG tablet Take 1 tablet (25 mg total) by mouth at bedtime.   REPATHA SURECLICK 630 MG/ML SOAJ INJECT 1 PEN INTO THE SKIN EVERY 14 DAYS   temazepam (RESTORIL) 15 MG capsule Take 1 capsule (15 mg total) by mouth at bedtime.   TRUEplus  Lancets 33G MISC Use to check blood sugars twice a day   valACYclovir (VALTREX) 1000 MG tablet Take 1 tablet (1,000 mg total) by mouth 3 (three) times daily.   vitamin B-12 (CYANOCOBALAMIN) 100 MCG tablet Take 100 mcg by mouth daily.   Vitamin E 400 units TABS Take by mouth daily.    [DISCONTINUED] amLODipine (NORVASC) 5 MG tablet TAKE 1 TABLET EVERY DAY   [DISCONTINUED] clopidogrel (PLAVIX) 75 MG tablet Take 1 tablet (75 mg total) by mouth daily with breakfast.     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)  Tobacco Use   Smoking status: Never   Smokeless tobacco: Never  Vaping Use   Vaping Use: Never used  Substance and Sexual Activity   Alcohol use: No   Drug use: No   Sexual activity: Never  Other Topics Concern   Not on file  Social History Narrative   Not on file   Social Determinants of Health   Financial Resource Strain: Low Risk    Difficulty of Paying Living Expenses: Not hard at all  Food Insecurity: No Food Insecurity   Worried About Charity fundraiser in the Last Year: Never true   [place] in the Last Year: Never true  Transportation Needs: No Transportation Needs   Lack of Transportation (Medical): No   Lack of Transportation (Non-Medical): No  Physical Activity: Insufficiently Active   Days of Exercise per Week: 7 days   Minutes of Exercise per Session: 20 min  Stress: No Stress Concern Present   Feeling of Stress : Not at all  Social Connections: Moderately Integrated   Frequency of Communication with Friends and Family: More than three times a week   Frequency of  Social Gatherings with Friends and Family: Once a week   Attends Religious Services: 1 to 4 times per year   Active Member of [employer] or Organizations: No   Attends Archivist Meetings: 1 to 4 times per year   Marital Status: Widowed     Family History: The patient's family history includes Glaucoma in her father; Hypertension in her mother and another family member.  ROS:   Please see the history of present illness.    All other systems reviewed and are negative.  EKGs/Labs/Other Studies Reviewed:    The following studies were reviewed today:  TAVR OPERATIVE NOTE     Date of Procedure:                08/30/2020   Preoperative Diagnosis:      Severe Aortic Stenosis   Postoperative Diagnosis:    Same   Procedure:        Transcatheter Aortic Valve Replacement - Percutaneous Right Transfemoral Approach             Medtronic CoreValve Evolut ProPlus (size 23 mm, serial # [id number])              Co-Surgeons:                        Lauree Chandler, MD and Valentina Gu. Roxy Manns, MD   Anesthesiologist:                  Myrtie Soman, MD   Echocardiographer:              Jenkins Rouge, MD   Pre-operative Echo Findings: Severe aortic stenosis Normal left ventricular systolic function   Post-operative Echo Findings: Mild paravalvular leak Normal left ventricular systolic function  ____________________  Echo 08/31/20 IMPRESSIONS   1. Left ventricular ejection fraction, by estimation, is 60 to 65%. The  left ventricle has normal function. The left ventricle has no regional  wall motion abnormalities. Left ventricular diastolic parameters are  indeterminate.   2. Right ventricular systolic function is normal.  The right ventricular  size is normal. There is normal pulmonary artery systolic pressure.   3. Left atrial size was moderately dilated.   4. The mitral valve is degenerative. Mild mitral valve regurgitation. No  evidence of mitral stenosis. Severe mitral annular  calcification.   5. Well placed 21 mm Medtronic Evolut Pro valve Trivial central aortic  regurgitation mean gradient 11 peak 26.5 mmHg AVA 1.8 cm2 . The aortic  valve has been repaired/replaced. Aortic valve regurgitation is not  visualized. No aortic stenosis is present.  Procedure Date: 08/30/2020.   6. The inferior vena cava is normal in size with greater than 50%  respiratory variability, suggesting right atrial pressure of 3 mmHg.   _____________________  Echo 09/29/20 IMPRESSIONS  1. Hyperdynamic LV function; s/p TAVR (23 mm Medtronic Evolut Pro Valve);  mean gradient 14 mmHg; mild AI (slightly worse compared to previous).   2. Left ventricular ejection fraction, by estimation, is >75%. The left  ventricle has hyperdynamic function. The left ventricle has no regional  wall motion abnormalities. There is mild left ventricular hypertrophy.  Left ventricular diastolic parameters  are indeterminate.   3. Right ventricular systolic function is normal. The right ventricular  size is normal. There is moderately elevated pulmonary artery systolic  pressure.   4. Left atrial size was moderately dilated.   5. The mitral valve is normal in structure. Trivial mitral valve  regurgitation. Mild mitral stenosis. Severe mitral annular calcification.   6. The aortic valve has been repaired/replaced. Aortic valve  regurgitation is mild. No aortic stenosis is present. There is a 23 mm  Medtronic Evolut Pro Valve valve present in the aortic position.   7. The inferior vena cava is normal in size with greater than 50%  respiratory variability, suggesting right atrial pressure of 3 mmHg.   EKG:  EKG is NOT ordered today.    Recent Labs: 08/23/2020: TSH 1.542 08/29/2020: ALT 32 08/31/2020: BUN 18; Creatinine, Ser 0.86; Magnesium 2.1; Potassium 3.5; Sodium 135 09/01/2020: Hemoglobin 11.1; Platelets 224  Recent Lipid Panel    Component Value Date/Time   CHOL 153 08/23/2020 1427   TRIG 144  08/23/2020 1427   HDL 41 08/23/2020 1427   CHOLHDL 3.7 08/23/2020 1427   VLDL 29 08/23/2020 1427   LDLCALC 83 08/23/2020 1427   LDLDIRECT 186.1 03/24/2013 0923     Risk Assessment/Calculations:       Physical Exam:    VS:  BP (!) 150/58   Pulse 74   Ht '5\' 3"'  (1.6 m)   Wt 155 lb (70.3 kg)   SpO2 99%   BMI 27.46 kg/m     Wt Readings from Last 3 Encounters:  09/29/20 155 lb (70.3 kg)  09/14/20 155 lb (70.3 kg)  09/02/20 154 lb 12.2 oz (70.2 kg)     GEN:  Well nourished, well developed in no acute distress, in a wheelchair HEENT: Normal NECK: No JVD LYMPHATICS: No lymphadenopathy CARDIAC: RRR, soft flow murmur @ RUSB. No rubs, gallops RESPIRATORY:  Clear to auscultation without rales, wheezing or rhonchi  ABDOMEN: Soft, non-tender, non-distended MUSCULOSKELETAL: 1+ mild Left LE edema.  SKIN: Warm and dry.  NEUROLOGIC:  Alert and oriented x 3 PSYCHIATRIC:  Normal affect   ASSESSMENT:    1. S/P TAVR (transcatheter aortic valve replacement)   2. Essential hypertension   3. Hyperlipidemia, unspecified hyperlipidemia type   4. Closed displaced fracture of condyle of right femur with routine healing, subsequent encounter   5. Nausea  PLAN:    In order of problems listed above:  Severe AS s/p TAVR: echo today shows EF >75%, normally functioning TAVR with a mean gradient of 14 mm hg and mild PVL (mildly worse than previous). She has NYHA class II symptoms- just generally feels unwell and has fatigue. Continue on aspirin and plavix. She can stop plavix 02/2021. SBE prophylaxis discussed; she has amoxicillin. I will see her back in 1 year for follow up and echo.  HTN: BP remains elevated today and with moderate control by log from facility. Will increase Norvasc from 68m to 167mdaily.   HLD: continue Repatha  Traumatic femur fracture: starting to weight bear now. Does take vicodin for pain and I wonder if this is contributing to her nausea.  Nausea/dysuria: as  above, could be related to narcotics.  I have Rx'd PRN zofran. Would recommend UA and culture at facility. She thinks she gave a urine specimen this morning.     Cardiac Rehabilitation Eligibility Assessment: The patient is NOT ready to start Cardiac Rehab due to femur fracture and just starting to weight bear.   Medication Adjustments/Labs and Tests Ordered: Current medicines are reviewed at length with the patient today.  Concerns regarding medicines are outlined above.  No orders of the defined types were placed in this encounter.   Meds ordered this encounter  Medications   clopidogrel (PLAVIX) 75 MG tablet    Sig: Take 1 tablet (75 mg total) by mouth daily with breakfast.    Dispense:  30 tablet    Refill:  5    Updated to add END DATE. DC after 03/02/21   amLODipine (NORVASC) 10 MG tablet    Sig: Take 1 tablet (10 mg total) by mouth daily.    Dispense:  90 tablet    Refill:  1    Dose increase   ondansetron (ZOFRAN) 4 MG tablet    Sig: Take 1 tablet (4 mg total) by mouth every 8 (eight) hours as needed for nausea or vomiting.    Dispense:  20 tablet    Refill:  1      Patient Instructions  Medication Instructions:  Your physician has recommended you make the following change in your medication:  1.) stop Plavix on 03/02/2021 2.) increase amlodipine to 10 mg daily 3.) ondansetron (Zofran) 4 mg tab - take one tablet every 8 hours as needed for nausea  *If you need a refill on your cardiac medications before your next appointment, please call your pharmacy*   Lab Work: Pt needs to have urinalysis with culture for dysuria   Testing/Procedures: none   Follow-Up: [facility]  As part of our continuing mission to provide you with exceptional heart care, we have created designated Provider Care Teams.  These Care Teams include your primary Cardiologist (physician) and Advanced Practice Providers (APPs -  Physician  Assistants and Nurse Practitioners) who all work together to provide you with the care you need, when you need it.  We recommend signing up for the patient portal called "MyChart".  Sign up information is provided on this After Visit Summary.  MyChart is used to connect with patients for Virtual Visits (Telemedicine).  Patients are able to view lab/test results, encounter notes, upcoming appointments, etc.  Non-urgent messages can be sent to your provider as well.   To learn more about what you can do with MyChart, go to [email or web address]   Your next appointment:  4 month(s) December 8 at 3:00 pm with Dr. Harrington Challenger The format for your next appointment:   In Person  Provider:   You may see Dorris Carnes, MD or one of the following Advanced Practice Providers on your designated Care Team:   Richardson Dopp, PA-C Robbie Lis, PA-C   Other Instructions     Signed, Angelena Form, Hershal Coria  09/29/2020 5:28 PM    [place]

## 2020-10-03 DIAGNOSIS — I10 Essential (primary) hypertension: Secondary | ICD-10-CM | POA: Diagnosis not present

## 2020-10-03 DIAGNOSIS — R112 Nausea with vomiting, unspecified: Secondary | ICD-10-CM | POA: Diagnosis not present

## 2020-10-03 DIAGNOSIS — R1084 Generalized abdominal pain: Secondary | ICD-10-CM | POA: Diagnosis not present

## 2020-10-03 DIAGNOSIS — K219 Gastro-esophageal reflux disease without esophagitis: Secondary | ICD-10-CM | POA: Diagnosis not present

## 2020-10-04 DIAGNOSIS — K219 Gastro-esophageal reflux disease without esophagitis: Secondary | ICD-10-CM | POA: Diagnosis not present

## 2020-10-04 DIAGNOSIS — R1084 Generalized abdominal pain: Secondary | ICD-10-CM | POA: Diagnosis not present

## 2020-10-04 DIAGNOSIS — R112 Nausea with vomiting, unspecified: Secondary | ICD-10-CM | POA: Diagnosis not present

## 2020-10-04 DIAGNOSIS — K8020 Calculus of gallbladder without cholecystitis without obstruction: Secondary | ICD-10-CM | POA: Diagnosis not present

## 2020-10-05 ENCOUNTER — Other Ambulatory Visit: Payer: Self-pay

## 2020-10-05 ENCOUNTER — Ambulatory Visit
Admission: RE | Admit: 2020-10-05 | Discharge: 2020-10-05 | Disposition: A | Discharge status: Home / Self Care | Payer: Medicare HMO | Source: Ambulatory Visit | Attending: Orthopedic Surgery | Admitting: Orthopedic Surgery

## 2020-10-05 DIAGNOSIS — M25562 Pain in left knee: Secondary | ICD-10-CM

## 2020-10-05 DIAGNOSIS — M25462 Effusion, left knee: Secondary | ICD-10-CM | POA: Diagnosis not present

## 2020-10-06 DIAGNOSIS — R1084 Generalized abdominal pain: Secondary | ICD-10-CM | POA: Diagnosis not present

## 2020-10-06 DIAGNOSIS — R112 Nausea with vomiting, unspecified: Secondary | ICD-10-CM | POA: Diagnosis not present

## 2020-10-06 DIAGNOSIS — K219 Gastro-esophageal reflux disease without esophagitis: Secondary | ICD-10-CM | POA: Diagnosis not present

## 2020-10-10 DIAGNOSIS — M25561 Pain in right knee: Secondary | ICD-10-CM | POA: Diagnosis not present

## 2020-10-11 DIAGNOSIS — R110 Nausea: Secondary | ICD-10-CM | POA: Diagnosis not present

## 2020-10-11 DIAGNOSIS — R1031 Right lower quadrant pain: Secondary | ICD-10-CM | POA: Diagnosis not present

## 2020-10-11 DIAGNOSIS — N393 Stress incontinence (female) (male): Secondary | ICD-10-CM | POA: Diagnosis not present

## 2020-10-12 ENCOUNTER — Ambulatory Visit: Payer: Medicare HMO | Admitting: Adult Health

## 2020-10-12 ENCOUNTER — Encounter: Payer: Self-pay | Admitting: Adult Health

## 2020-10-12 VITALS — BP 132/80 | HR 74

## 2020-10-12 DIAGNOSIS — I639 Cerebral infarction, unspecified: Secondary | ICD-10-CM | POA: Diagnosis not present

## 2020-10-12 DIAGNOSIS — G3184 Mild cognitive impairment, so stated: Secondary | ICD-10-CM

## 2020-10-12 DIAGNOSIS — M5481 Occipital neuralgia: Secondary | ICD-10-CM

## 2020-10-12 DIAGNOSIS — H538 Other visual disturbances: Secondary | ICD-10-CM

## 2020-10-12 NOTE — Progress Notes (Signed)
[facility] [address] [place]. [place] [address] [phone]       HOSPITAL FOLLOW UP NOTE  Terri King Date of Birth:  09-Sep-1934 Medical Record Number:  [id number]   Reason for Referral:  hospital stroke follow up    SUBJECTIVE:   CHIEF COMPLAINT:  Chief Complaint  Patient presents with   Follow-up    Rm 2 with son. Here for hsp f/u. Reports since hsp visit she has been residing at [facility] ( here in [place]) has been working with the therapist. Since the hsp stay, her vision has not been the same along with sharp pain in the base of her neck ( 2-3 time per day)     HPI:   Terri King is a 86 y.o. female with medical history significant for HTN, moderate AS (per 03/2019 echo), CAD, hx of MI, CAD, OA who presented on 08/19/2020 after being found on the floor at home by family with resultant left lateral condyle fracture femur.  Further work-up for syncope, vomiting, lethargy/weakness and confusion found to be on multiple over sedating medications including temazepam, gabapentin and Seroquel weaned down/off.  Repeat echo revealed progression of aortic stenosis graded as severe. MR brain showed 5 mm acute cortical infarct within the left occipital lobe and tiny chronic lacunar infarcts in the left cerebellar hemisphere. Eval by Dr. Erlinda Hong who felt infarct likely incidental finding likely secondary to either embolization of heavy calcified stenosis vs known severe left V4 segment vertebral artery stenosis.  EEG unremarkable.  A1c 5.2.  LDL 83.  Recommended DAPT for 3 months given severe left VA stenosis with further duration per cardiology.  Also advised ongoing use of Repatha. Eval by structural heart given severe AS and syncope.  S/p R/L heart cath 7/6 unremarkable for significant CAD. S/p TAVR on 7/12.  Orthopedics consulted for fracture left femoral condyle recommend immediate immobilizer and follow-up with orthopedics outpatient. PT/OT eval for  debility/deconditioning and recommended SNF at discharge.  Today, 10/12/2020, Ms. Howden is being seen for hospital follow-up accompanied by her son.  She continues to reside at [facility].  Overall doing well. Reports decreased vision acuity bilaterally with greater difficulty reading but denies specific area (such as peripheral or central). Also reports occasional "stars" in her vision which can happen a couple times a day lasting only 1 to 2 seconds without any other associated symptoms such as headache or worsening vision.  Also experiencing occasional right-sided sharp quick zap type sensation in occipital area. Not aggravated by neck movement or position.  Denies actual neck pain. Mild short term memory difficulties but otherwise cognition back to baseline.  She has been working with therapies and is currently full weightbearing with use of rolling walker short distance and w/c for long distance.  Denies any additional falls.  Recent visit with Ortho on Monday.  Per MAR, remains on aspirin and Plavix as well as Repatha.  Blood pressure today 132/80.  Routinely monitored at facility which has been stable.  No further concerns at this time.    Pertinent imaging  CT head 08/19/2020 shows no acute abnormality. MRI brain 08/23/2020 shows tiny 5 mm left occipital cortical and subcortical infarct. MR angiogram of neck on 08/25/2020 shows small left carotid web and MR angiogram of the brain shows moderate to severe left V4 segment stenosis Carotid ultrasound 08/23/2020 shows no significant extracranial stenosis. ECHO 08/20/2020 shows severe calcification in the aortic valve with thickening and severe aortic stenosis with aortic  valve area of 0.9 cm Right heart catheterization on 08/24/2020 shows severe aortic stenosis and aortic valve was not crossed.  Angiographically normal coronary arteries but very tortuous.  Normal right heart Pressure and cardiac output and index LDL 83  HbA1c 5.2.      ROS:   14  system review of systems performed and negative with exception of those listed in HPI  PMH:  Past Medical History:  Diagnosis Date   Family history of anesthesia complication    NIENCE had sensitivity   History of pneumonia 2008   HTN (hypertension)    dr plotnikov   Hyperlipemia    Osteoarthritis    Severe aortic stenosis     PSH:  Past Surgical History:  Procedure Laterality Date   JOINT REPLACEMENT  12/13   L TKR    KNEE ARTHROSCOPY     left   PARTIAL HYSTERECTOMY     RIGHT HEART CATH AND CORONARY ANGIOGRAPHY N/A 08/24/2020   Procedure: RIGHT HEART CATH AND CORONARY ANGIOGRAPHY;  Surgeon: Leonie Man, MD;  Location: [facility];  Service: Cardiovascular;  Laterality: N/A;   TEE WITHOUT CARDIOVERSION N/A 08/30/2020   Procedure: TRANSESOPHAGEAL ECHOCARDIOGRAM (TEE);  Surgeon: Burnell Blanks, MD;  Location: [facility];  Service: Open Heart Surgery;  Laterality: N/A;   TONSILLECTOMY     TOTAL KNEE ARTHROPLASTY  01/23/2012   Procedure: TOTAL KNEE ARTHROPLASTY;  Surgeon: Ninetta Lights, MD;  Location: [facility];  Service: Orthopedics;  Laterality: Left;   TOTAL KNEE ARTHROPLASTY  12/'05/2011   left knee   TRANSCATHETER AORTIC VALVE REPLACEMENT, TRANSFEMORAL N/A 08/30/2020   Procedure: TRANSCATHETER AORTIC VALVE REPLACEMENT, TRANSFEMORAL;  Surgeon: Burnell Blanks, MD;  Location: [facility];  Service: Open Heart Surgery;  Laterality: N/A;    Social History:  Social History   Socioeconomic History   Marital status: Widowed    Spouse name: Not on file   Number of children: Not on file   Years of education: Not on file   Highest education level: Not on file  Occupational History   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)  Tobacco Use   Smoking status: Never   Smokeless tobacco: Never  Vaping Use   Vaping Use: Never used  Substance and Sexual Activity   Alcohol use: No   Drug use: No   Sexual activity: Never   Other Topics Concern   Not on file  Social History Narrative   Not on file   Social Determinants of Health   Financial Resource Strain: Low Risk    Difficulty of Paying Living Expenses: Not hard at all  Food Insecurity: No Food Insecurity   Worried About Charity fundraiser in the Last Year: Never true   [place] in the Last Year: Never true  Transportation Needs: No Transportation Needs   Lack of Transportation (Medical): No   Lack of Transportation (Non-Medical): No  Physical Activity: Insufficiently Active   Days of Exercise per Week: 7 days   Minutes of Exercise per Session: 20 min  Stress: No Stress Concern Present   Feeling of Stress : Not at all  Social Connections: Moderately Integrated   Frequency of Communication with Friends and Family: More than three times a week   Frequency of Social Gatherings with Friends and Family: Once a week   Attends Religious Services: 1 to 4 times per year   Active Member of Clubs or Organizations: No  Attends Archivist Meetings: 1 to 4 times per year   Marital Status: Widowed  Human resources officer Violence: Not At Risk   Fear of Current or Ex-Partner: No   Emotionally Abused: No   Physically Abused: No   Sexually Abused: No    Family History:  Family History  Problem Relation Age of Onset   Glaucoma Father    Hypertension Mother    Hypertension Other     Medications:   Current Outpatient Medications on File Prior to Visit  Medication Sig Dispense Refill   Alcohol Swabs (B-D SINGLE USE SWABS REGULAR) PADS 1 each by Does not apply route as directed. Dx: R73.9 180 each 3   amLODipine (NORVASC) 10 MG tablet Take 1 tablet (10 mg total) by mouth daily. 90 tablet 1   amoxicillin (AMOXIL) 500 MG tablet Take FOUR TABLETS (2000 mg) by mouth ONE HOUR before any dental procedures 12 tablet 2   Ascorbic Acid (VITAMIN C PO) Take 1 tablet by mouth daily.     aspirin EC 81 MG EC tablet Take 1 tablet (81 mg total) by mouth  daily. Swallow whole. 30 tablet 11   Blood Glucose Calibration (TRUE METRIX LEVEL 1) Low SOLN 1 each by In Vitro route as directed. Dx: R73.9 3 each 3   Blood Glucose Monitoring Suppl (TRUE METRIX METER) w/Device KIT USE AS DIRECTED 1 kit 0   cholecalciferol (VITAMIN D) 400 UNITS TABS Take 400 Units by mouth daily.     clopidogrel (PLAVIX) 75 MG tablet Take 1 tablet (75 mg total) by mouth daily with breakfast. 30 tablet 5   escitalopram (LEXAPRO) 10 MG tablet Take 1 tablet (10 mg total) by mouth daily. 90 tablet 3   gabapentin (NEURONTIN) 100 MG capsule TAKE 1 CAPSULE THREE TIMES DAILY 270 capsule 3   glucose blood (TRUE METRIX BLOOD GLUCOSE TEST) test strip Use to check blood sugars twice a day 180 each 3   HYDROcodone-acetaminophen (NORCO/VICODIN) 5-325 MG tablet Take 1 tablet by mouth every 6 (six) hours as needed for moderate pain or severe pain. 15 tablet 0   ibuprofen (IBU) 600 MG tablet TAKE 1 TABLET EVERY DAY AS NEEDED FOR MODERATE PAIN 90 tablet 0   Krill Oil (MAXIMUM RED KRILL) 300 MG CAPS 1 po qd 100 capsule 3   metoprolol succinate (TOPROL-XL) 50 MG 24 hr tablet TAKE 1 TABLET (50 MG TOTAL) BY MOUTH AT BEDTIME / WITH OR IMMEDIATELY FOLLOWING A MEAL AS DIRECTED 90 tablet 3   Multiple Vitamins-Minerals (MULTIPLE VITAMINS/WOMENS PO) Take by mouth daily.      nitroGLYCERIN (NITROSTAT) 0.4 MG SL tablet Place 1 tablet (0.4 mg total) under the tongue every 5 (five) minutes as needed for chest pain. 25 tablet 6   ondansetron (ZOFRAN) 4 MG tablet Take 1 tablet (4 mg total) by mouth every 8 (eight) hours as needed for nausea or vomiting. 20 tablet 1   potassium chloride SA (KLOR-CON) 20 MEQ tablet Take 20 mEq by mouth daily.     QUEtiapine (SEROQUEL) 25 MG tablet Take 1 tablet (25 mg total) by mouth at bedtime.     REPATHA SURECLICK 701 MG/ML SOAJ INJECT 1 PEN INTO THE SKIN EVERY 14 DAYS 6 mL 3   temazepam (RESTORIL) 15 MG capsule Take 1 capsule (15 mg total) by mouth at bedtime. 5 capsule 0    TRUEplus Lancets 33G MISC Use to check blood sugars twice a day 180 each 3   valACYclovir (VALTREX) 1000 MG tablet Take  1 tablet (1,000 mg total) by mouth 3 (three) times daily. 21 tablet 0   vitamin B-12 (CYANOCOBALAMIN) 100 MCG tablet Take 100 mcg by mouth daily.     Vitamin E 400 units TABS Take by mouth daily.      No current facility-administered medications on file prior to visit.    Allergies:   Allergies  Allergen Reactions   Codeine Other (See Comments)    Abnormal behavior   Crestor [Rosuvastatin]     Myalgias    Dexilant [Dexlansoprazole]     Made me sick   Ibuprofen Nausea And Vomiting   Simvastatin     REACTION: achy   Tylenol [Acetaminophen]    Iodine Swelling and Rash      OBJECTIVE:  Physical Exam  Vitals:   10/12/20 0921  BP: 132/80  Pulse: 74  SpO2: 97%   There is no height or weight on file to calculate BMI. No results found.  General: well developed, well nourished, very pleasant elderly African-American female, seated, in no evident distress Head: head normocephalic and atraumatic.   Neck: supple with no carotid or supraclavicular bruits Cardiovascular: regular rate and rhythm, no murmurs Musculoskeletal: no deformity; limited LLE movement d/t pain Skin:  no rash/petichiae Vascular:  Normal pulses all extremities   Neurologic Exam Mental Status: Awake and fully alert.  Fluent speech and language.  Oriented to place and time. Recent memory slightly impaired and remote memory intact. Attention span, concentration and fund of knowledge appropriate. Mood and affect appropriate.  Cranial Nerves: Fundoscopic exam reveals sharp disc margins. Pupils equal, briskly reactive to light. Extraocular movements full without nystagmus. Visual fields full to confrontation except slightly blurred vision in left peripheral bilaterally. Hearing intact. Facial sensation intact. Face, tongue, palate moves normally and symmetrically. Pressure over right greater  occipital nerve trigger painful paroxysm Motor: Normal bulk and tone. Normal strength in all tested extremity muscles Sensory.: intact to touch , pinprick , position and vibratory sensation.  Coordination: Rapid alternating movements normal in all extremities. Finger-to-nose and heel-to-shin performed accurately RLE - difficulty performing LLE d/t fracture. Gait and Station: deferred as RW not present Reflexes: 1+ and symmetric. Toes downgoing.     NIHSS  1 Modified Rankin  1      ASSESSMENT: Terri King is a 86 y.o. year old female with recent incidental finding of left occipital infarct secondary to either embolization or heavy calcified aortic stenosis or known severe L V4 segment stenosis on 08/19/2020 after presenting after being found down from syncopal event with confusion with resultant left femur fracture.  Vascular risk factors include HTN, HLD, severe AS s/p TAVR (08/2020), CAD, and hx of MI.      PLAN:  L occipital stroke, incidental finding:  Functionally recovered well.  ? Decreased left peripheral vision field bilaterally -referral placed to ophthalmology for further evaluation continue aspirin 81 mg daily and clopidogrel 75 mg daily and Repatha for secondary stroke prevention. Per cards, can d/c plavix 02/2021 s/p TAVR.   Discussed secondary stroke prevention measures and importance of close PCP follow up for aggressive stroke risk factor management. I have gone over the pathophysiology of stroke, warning signs and symptoms, risk factors and their management in some detail with instructions to go to the closest emergency room for symptoms of concern. HTN: BP goal <130/90.  Stable on current regimen per cardiology HLD: LDL goal <70. Recent LDL 83 on Repatha per cardiology.  S/p TAVR: Routinely followed by cardiology Dr. Harrington Challenger and Curt Bears  [place], [place] R Occipital neuralgia: Symptoms consistent with occipital neuralgia.  Only occurring intermittently therefore no indication  for nerve block injections or intervention. Recommend working with PT for neck strengthening exercises. CT cervical completed during admission which was unremarkable.  Continue to monitor Left femur fracture: Currently full weightbearing.  Continue working with PT at [facility] and routine follow-up with orthopedics Cognitive impairment: Baseline mild cognitive impairment with short-term memory loss.  Currently at baseline per son. Will complete MMSE at f/u visit per son request    Follow up in 4 months or call earlier if needed   CC:  [facility] provider: Dr. Leonie Man PCP: [name], Evie Lacks, MD    I spent 54 minutes of face-to-face and non-face-to-face time with patient and son.  This included previsit chart review including review of recent hospitalization, lab review, study review, order entry, electronic health record documentation, patient and son education regarding recent stroke as well as etiology, secondary stroke prevention measures and importance of managing stroke risk factors, visual concerns and likely right occipital neuralgia and answered all other questions to patient and sons satisfaction as noted above  Frann Rider, AGNP-BC  [facility] [address] [place], [place] [address]  Phone [phone] Fax [phone] Note: This document was prepared with digital dictation and possible smart phrase technology. Any transcriptional errors that result from this process are unintentional.

## 2020-10-12 NOTE — Progress Notes (Signed)
I agree with the above plan 

## 2020-10-12 NOTE — Patient Instructions (Signed)
Referral placed to ophthalmology to further evaluate visual concerns  Occassional neck pain likely related to occipital neuralgia - this can get better on its own but at times, we need to do nerve block injections to help with pain. As symptoms occur only on occasion, would recommend continued monitoring for now but please let me know if symptoms should worsen  Continue aspirin 81 mg daily and clopidogrel 75 mg daily  and Repatha  for secondary stroke prevention and cardiac procedure  Continue to follow up with PCP/cardiology regarding cholesterol and blood pressure management  Maintain strict control of hypertension with blood pressure goal below 130/90 and cholesterol with LDL cholesterol (bad cholesterol) goal below 70 mg/dL.       Followup in the future with me in 4 months or call earlier if needed       Thank you for coming to see [place] at [facility]. I hope we have been able to provide you high quality care today.  You may receive a patient satisfaction survey over the next few weeks. We would appreciate your feedback and comments so that we may continue to improve ourselves and the health of our patients.

## 2020-10-17 ENCOUNTER — Telehealth: Payer: Self-pay

## 2020-10-17 NOTE — Telephone Encounter (Signed)
Referral for ophthalmology sent to [facility]. P: [phone]. 

## 2020-10-26 DIAGNOSIS — M25561 Pain in right knee: Secondary | ICD-10-CM | POA: Diagnosis not present

## 2020-10-26 DIAGNOSIS — M545 Low back pain, unspecified: Secondary | ICD-10-CM | POA: Diagnosis not present

## 2020-10-27 ENCOUNTER — Telehealth: Payer: Self-pay

## 2020-10-27 ENCOUNTER — Encounter: Payer: Self-pay | Admitting: Emergency Medicine

## 2020-10-27 ENCOUNTER — Other Ambulatory Visit: Payer: Self-pay

## 2020-10-27 ENCOUNTER — Ambulatory Visit (INDEPENDENT_AMBULATORY_CARE_PROVIDER_SITE_OTHER): Payer: Medicare HMO | Admitting: Emergency Medicine

## 2020-10-27 VITALS — BP 148/76 | HR 72 | Ht 63.0 in | Wt 158.0 lb

## 2020-10-27 DIAGNOSIS — M159 Polyosteoarthritis, unspecified: Secondary | ICD-10-CM | POA: Insufficient documentation

## 2020-10-27 DIAGNOSIS — S72411D Displaced unspecified condyle fracture of lower end of right femur, subsequent encounter for closed fracture with routine healing: Secondary | ICD-10-CM | POA: Diagnosis not present

## 2020-10-27 DIAGNOSIS — M8949 Other hypertrophic osteoarthropathy, multiple sites: Secondary | ICD-10-CM | POA: Diagnosis not present

## 2020-10-27 DIAGNOSIS — I10 Essential (primary) hypertension: Secondary | ICD-10-CM | POA: Diagnosis not present

## 2020-10-27 DIAGNOSIS — R269 Unspecified abnormalities of gait and mobility: Secondary | ICD-10-CM | POA: Diagnosis not present

## 2020-10-27 DIAGNOSIS — Z8673 Personal history of transient ischemic attack (TIA), and cerebral infarction without residual deficits: Secondary | ICD-10-CM | POA: Diagnosis not present

## 2020-10-27 DIAGNOSIS — Z23 Encounter for immunization: Secondary | ICD-10-CM | POA: Diagnosis not present

## 2020-10-27 DIAGNOSIS — Z952 Presence of prosthetic heart valve: Secondary | ICD-10-CM | POA: Diagnosis not present

## 2020-10-27 DIAGNOSIS — Z7189 Other specified counseling: Secondary | ICD-10-CM

## 2020-10-27 NOTE — Progress Notes (Signed)
Terri King 86 y.o.   Chief Complaint  Patient presents with   Follow-up    Rehab follow up, pt had a fall with knee and neck injuries in July.    HISTORY OF PRESENT ILLNESS: This is a 86 y.o. female brought in by son with the following history: [place] at home the evening before July 1.  Found by son on the floor.  EMS transported her to [facility] where she spent 5 days before getting transferred to [facility].  Had CVA prior to fall.  Sustained fracture to left knee and also cervical spine. Had heart valve replacement surgery.  Was discharged to rehab facility, [facility].  Has had follow-up with neurologist and orthopedist.  Recently discharged home from [facility].  Saw orthopedist, Dr. Percell Miller, yesterday who recommends ongoing physical therapy.  Son looking to place patient in a rehab facility. Most recent neurologist office visit on 10/12/2020 as follows:  ASSESSMENT: Terri King is a 86 y.o. year old female with recent incidental finding of left occipital infarct secondary to either embolization or heavy calcified aortic stenosis or known severe L V4 segment stenosis on 08/19/2020 after presenting after being found down from syncopal event with confusion with resultant left femur fracture.  Vascular risk factors include HTN, HLD, severe AS s/p TAVR (08/2020), CAD, and hx of MI.          PLAN:   L occipital stroke, incidental finding:  Functionally recovered well.  ? Decreased left peripheral vision field bilaterally -referral placed to ophthalmology for further evaluation continue aspirin 81 mg daily and clopidogrel 75 mg daily and Repatha for secondary stroke prevention. Per cards, can d/c plavix 02/2021 s/p TAVR.   Discussed secondary stroke prevention measures and importance of close PCP follow up for aggressive stroke risk factor management. I have gone over the pathophysiology of stroke, warning signs and symptoms, risk factors and their management in some detail with  instructions to go to the closest emergency room for symptoms of concern. HTN: BP goal <130/90.  Stable on current regimen per cardiology HLD: LDL goal <70. Recent LDL 83 on Repatha per cardiology.  S/p TAVR: Routinely followed by cardiology Dr. Harrington Challenger and Angelena Form, PA R Occipital neuralgia: Symptoms consistent with occipital neuralgia.  Only occurring intermittently therefore no indication for nerve block injections or intervention. Recommend working with PT for neck strengthening exercises. CT cervical completed during admission which was unremarkable.  Continue to monitor Left femur fracture: Currently full weightbearing.  Continue working with PT at [facility] and routine follow-up with orthopedics Cognitive impairment: Baseline mild cognitive impairment with short-term memory loss.  Currently at baseline per son. Will complete MMSE at f/u visit per son request       Follow up in 4 months or call earlier if needed     CC:  [facility] provider: Dr. Leonie Man PCP: [name], Evie Lacks, MD     HPI   Prior to Admission medications   Medication Sig Start Date End Date Taking? Authorizing Provider  acetaminophen (TYLENOL) 325 MG tablet Take 650 mg by mouth every 6 (six) hours as needed.   Yes [provider]  Alcohol Swabs (B-D SINGLE USE SWABS REGULAR) PADS 1 each by Does not apply route as directed. Dx: R73.9 02/23/20  Yes [name], Evie Lacks, MD  amLODipine (NORVASC) 10 MG tablet Take 1 tablet (10 mg total) by mouth daily. 09/29/20  Yes Eileen Stanford, PA-C  amoxicillin (AMOXIL) 500 MG tablet Take FOUR TABLETS (2000 mg)  by mouth ONE HOUR before any dental procedures 09/14/20  Yes Eileen Stanford, PA-C  Ascorbic Acid (VITAMIN C PO) Take 1 tablet by mouth daily.   Yes [provider]  aspirin EC 81 MG EC tablet Take 1 tablet (81 mg total) by mouth daily. Swallow whole. 09/02/20  Yes Shelly Coss, MD  Blood Glucose Calibration (TRUE METRIX LEVEL 1) Low SOLN 1 each by In Vitro  route as directed. Dx: R73.9 02/23/20  Yes [name], Evie Lacks, MD  Blood Glucose Monitoring Suppl (TRUE METRIX METER) w/Device KIT USE AS DIRECTED 04/28/20  Yes [name], Evie Lacks, MD  calcium carbonate (TUMS - DOSED IN MG ELEMENTAL CALCIUM) 500 MG chewable tablet Chew 1 tablet by mouth daily.   Yes [provider]  cholecalciferol (VITAMIN D) 400 UNITS TABS Take 400 Units by mouth daily.   Yes [provider]  clopidogrel (PLAVIX) 75 MG tablet Take 1 tablet (75 mg total) by mouth daily with breakfast. 09/29/20 03/02/21 Yes Eileen Stanford, PA-C  diphenhydrAMINE (BENADRYL) 25 MG tablet Take 25 mg by mouth every 6 (six) hours as needed.   Yes [provider]  escitalopram (LEXAPRO) 10 MG tablet Take 1 tablet (10 mg total) by mouth daily. 06/01/20  Yes [name], Evie Lacks, MD  famotidine (PEPCID) 20 MG tablet Take 20 mg by mouth 2 (two) times daily.   Yes [provider]  gabapentin (NEURONTIN) 100 MG capsule TAKE 1 CAPSULE THREE TIMES DAILY 06/30/20  Yes [name], Evie Lacks, MD  glucose blood (TRUE METRIX BLOOD GLUCOSE TEST) test strip Use to check blood sugars twice a day 02/23/20  Yes [name], Evie Lacks, MD  hydrALAZINE (APRESOLINE) 25 MG tablet Take 25 mg by mouth 3 (three) times daily.   Yes [provider]  HYDROcodone-acetaminophen (NORCO/VICODIN) 5-325 MG tablet Take 1 tablet by mouth every 6 (six) hours as needed for moderate pain or severe pain. 09/01/20  Yes Shelly Coss, MD  ibuprofen (IBU) 600 MG tablet TAKE 1 TABLET EVERY DAY AS NEEDED FOR MODERATE PAIN 05/26/20  Yes [name], Evie Lacks, MD  Krill Oil (MAXIMUM RED KRILL) 300 MG CAPS 1 po qd 07/22/13  Yes [name], Evie Lacks, MD  menthol-cetylpyridinium (CEPACOL) 3 MG lozenge Take 1 lozenge by mouth as needed for sore throat.   Yes [provider]  metoprolol succinate (TOPROL-XL) 50 MG 24 hr tablet TAKE 1 TABLET (50 MG TOTAL) BY MOUTH AT BEDTIME / WITH OR IMMEDIATELY FOLLOWING A MEAL  AS DIRECTED 04/10/20  Yes [name], Evie Lacks, MD  Multiple Vitamins-Minerals (MULTIPLE VITAMINS/WOMENS PO) Take by mouth daily.    Yes [provider]  nitroGLYCERIN (NITROSTAT) 0.4 MG SL tablet Place 1 tablet (0.4 mg total) under the tongue every 5 (five) minutes as needed for chest pain. 03/20/19  Yes Weaver, Scott T, PA-C  Olopatadine HCl (PATADAY OP) Apply to eye.   Yes [provider]  ondansetron (ZOFRAN) 4 MG tablet Take 1 tablet (4 mg total) by mouth every 8 (eight) hours as needed for nausea or vomiting. 09/29/20  Yes Eileen Stanford, PA-C  potassium chloride SA (KLOR-CON) 20 MEQ tablet Take 20 mEq by mouth daily. 02/29/20  Yes [provider]  QUEtiapine (SEROQUEL) 25 MG tablet Take 1 tablet (25 mg total) by mouth at bedtime. 09/01/20  Yes Shelly Coss, MD  REPATHA SURECLICK 333 MG/ML SOAJ INJECT 1 PEN INTO THE SKIN EVERY 14 DAYS 03/15/20  Yes Fay Records, MD  temazepam (RESTORIL) 15 MG capsule Take 1  capsule (15 mg total) by mouth at bedtime. 09/01/20  Yes Shelly Coss, MD  TRUEplus Lancets 33G MISC Use to check blood sugars twice a day 02/23/20  Yes [name], Evie Lacks, MD  valACYclovir (VALTREX) 1000 MG tablet Take 1 tablet (1,000 mg total) by mouth 3 (three) times daily. 08/13/17  Yes [name], Evie Lacks, MD  vitamin B-12 (CYANOCOBALAMIN) 100 MCG tablet Take 100 mcg by mouth daily.   Yes [provider]  Vitamin E 400 units TABS Take by mouth daily.    Yes [provider]    Allergies  Allergen Reactions   Codeine Other (See Comments)    Abnormal behavior   Crestor [Rosuvastatin]     Myalgias    Dexilant [Dexlansoprazole]     Made me sick   Ibuprofen Nausea And Vomiting   Simvastatin     REACTION: achy   Tylenol [Acetaminophen]    Iodine Swelling and Rash    Patient Active Problem List   Diagnosis Date Noted   S/P TAVR (transcatheter aortic valve replacement)    Acute CVA (cerebrovascular accident) ([place]) 08/23/2020    Syncope 08/19/2020   Fracture of femoral condyle, closed ([place]) 08/19/2020   Severe aortic stenosis    Statin myopathy 04/08/2019   Well adult exam 08/07/2018   Hand weakness 08/07/2018   Gait disorder 08/07/2018   Headache 08/13/2017   Rash and nonspecific skin eruption 08/13/2017   Boil of buttock 05/09/2017   Shoulder pain, left 08/14/2016   Aortic stenosis, moderate 08/14/2016   Osteoporosis 11/01/2015   Cough 09/02/2015   Heart valve disease 06/14/2015   Insomnia 10/22/2013   UTI (urinary tract infection) 08/24/2013   Preop exam for internal medicine 01/16/2012   Ankle pain 07/11/2011   Adjustment disorder with mixed anxiety and depressed mood 03/28/2011   GERD (gastroesophageal reflux disease) 12/25/2010   Dysphagia 12/25/2010   Coronary atherosclerosis 02/09/2010   FEVER UNSPECIFIED 01/18/2010   CHEST PAIN 01/18/2010   Urinary incontinence 01/18/2010   BRONCHITIS NOT SPECIFIED AS ACUTE OR CHRONIC 12/05/2009   NEURALGIA, TRIGEMINAL 07/04/2009   EAR PAIN 07/04/2009   HYPOKALEMIA 02/17/2009   Arthralgia 02/17/2009   Hyperglycemia 09/02/2008   Osteoarthritis 03/11/2008   EDEMA 03/11/2008   KNEE PAIN 02/04/2008   CHEST XRAY, ABNORMAL 07/28/2007   Hyperlipidemia 03/17/2007   Essential hypertension 03/17/2007   UPPER RESPIRATORY INFECTION (URI) 03/17/2007   VAGINITIS 03/17/2007    Past Medical History:  Diagnosis Date   Family history of anesthesia complication    NIENCE had sensitivity   History of pneumonia 2008   HTN (hypertension)    dr plotnikov   Hyperlipemia    Osteoarthritis    Severe aortic stenosis     Past Surgical History:  Procedure Laterality Date   JOINT REPLACEMENT  12/13   L TKR    KNEE ARTHROSCOPY     left   PARTIAL HYSTERECTOMY     RIGHT HEART CATH AND CORONARY ANGIOGRAPHY N/A 08/24/2020   Procedure: RIGHT HEART CATH AND CORONARY ANGIOGRAPHY;  Surgeon: Leonie Man, MD;  Location: [place] [facility];  Service: Cardiovascular;   Laterality: N/A;   TEE WITHOUT CARDIOVERSION N/A 08/30/2020   Procedure: TRANSESOPHAGEAL ECHOCARDIOGRAM (TEE);  Surgeon: Burnell Blanks, MD;  Location: [place] [facility];  Service: Open Heart Surgery;  Laterality: N/A;   TONSILLECTOMY     TOTAL KNEE ARTHROPLASTY  01/23/2012   Procedure: TOTAL KNEE ARTHROPLASTY;  Surgeon: Ninetta Lights, MD;  Location: [place];  Service: Orthopedics;  Laterality: Left;   TOTAL KNEE ARTHROPLASTY  12/'05/2011   left knee   TRANSCATHETER AORTIC VALVE REPLACEMENT, TRANSFEMORAL N/A 08/30/2020   Procedure: TRANSCATHETER AORTIC VALVE REPLACEMENT, TRANSFEMORAL;  Surgeon: Burnell Blanks, MD;  Location: [place] [facility];  Service: Open Heart Surgery;  Laterality: N/A;    Social History   Socioeconomic History   Marital status: Widowed    Spouse name: Not on file   Number of children: Not on file   Years of education: Not on file   Highest education level: Not on file  Occupational History   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)  Tobacco Use   Smoking status: Never   Smokeless tobacco: Never  Vaping Use   Vaping Use: Never used  Substance and Sexual Activity   Alcohol use: No   Drug use: No   Sexual activity: Never  Other Topics Concern   Not on file  Social History Narrative   Not on file   Social Determinants of Health   Financial Resource Strain: Low Risk    Difficulty of Paying Living Expenses: Not hard at all  Food Insecurity: No Food Insecurity   Worried About Charity fundraiser in the Last Year: Never true   [place] in the Last Year: Never true  Transportation Needs: No Transportation Needs   Lack of Transportation (Medical): No   Lack of Transportation (Non-Medical): No  Physical Activity: Insufficiently Active   Days of Exercise per Week: 7 days   Minutes of Exercise per Session: 20 min  Stress: No Stress Concern Present   Feeling of Stress : Not at all  Social Connections:  Moderately Integrated   Frequency of Communication with Friends and Family: More than three times a week   Frequency of Social Gatherings with Friends and Family: Once a week   Attends Religious Services: 1 to 4 times per year   Active Member of [employer] or Organizations: No   Attends Archivist Meetings: 1 to 4 times per year   Marital Status: Widowed  Human resources officer Violence: Not At Risk   Fear of Current or Ex-Partner: No   Emotionally Abused: No   Physically Abused: No   Sexually Abused: No    Family History  Problem Relation Age of Onset   Glaucoma Father    Hypertension Mother    Hypertension Other      Review of Systems  Constitutional: Negative.  Negative for chills and fever.  HENT:  Negative for congestion and sore throat.   Respiratory:  Negative for cough and shortness of breath.   Cardiovascular:  Negative for chest pain.  Gastrointestinal:  Negative for abdominal pain, diarrhea, nausea and vomiting.  Skin: Negative.  Negative for rash.  Neurological:  Negative for dizziness and headaches.  All other systems reviewed and are negative. Today's Vitals   10/27/20 0909  BP: (!) 148/76  Pulse: 72  SpO2: 99%  Weight: 158 lb (71.7 kg)  Height: 5' 3" (1.6 m)   Body mass index is 27.99 kg/m.   Physical Exam Constitutional:      Appearance: Normal appearance.  HENT:     Head: Normocephalic.  Eyes:     Extraocular Movements: Extraocular movements intact.  Cardiovascular:     Rate and Rhythm: Normal rate.  Pulmonary:     Effort: Pulmonary effort is normal.  Skin:    General: Skin is warm and dry.  Neurological:     Mental Status:  She is alert and oriented to person, place, and time.  Psychiatric:        Mood and Affect: Mood normal.        Behavior: Behavior normal.     ASSESSMENT & PLAN: I do not know Ms. Ishee's medical history very well.  She is a patient of Dr. Alain Marion. She is here today with her son who lives in [place] and is  concerned about mother's present housing situation.  She lives alone. We both agree Ms. Isadore needs to be placed in a skilled nursing facility.  Arrangements to initiate this process will be started today and followed up by patient's PCP, Dr. Lew Dawes.  Daphyne was seen today for follow-up.  Diagnoses and all orders for this visit:  Closed displaced fracture of condyle of right femur with routine healing, subsequent encounter  Need for influenza vaccination -     Flu Vaccine QUAD High Dose(Fluad)  Essential hypertension  Primary osteoarthritis involving multiple joints  Gait disorder  S/P TAVR (transcatheter aortic valve replacement)  History of recent stroke   Patient Instructions  Health Maintenance After Age 36 After age 69, you are at a higher risk for certain long-term diseases and infections as well as injuries from falls. Falls are a major cause of broken bones and head injuries in people who are older than age 57. Getting regular preventive care can help to keep you healthy and well. Preventive care includes getting regular testing and making lifestyle changes as recommended by your health care provider. Talk with your health care provider about: Which screenings and tests you should have. A screening is a test that checks for a disease when you have no symptoms. A diet and exercise plan that is right for you. What should I know about screenings and tests to prevent falls? Screening and testing are the best ways to find a health problem early. Early diagnosis and treatment give you the best chance of managing medical conditions that are common after age 84. Certain conditions and lifestyle choices may make you more likely to have a fall. Your health care provider may recommend: Regular vision checks. Poor vision and conditions such as cataracts can make you more likely to have a fall. If you wear glasses, make sure to get your prescription updated if your vision  changes. Medicine review. Work with your health care provider to regularly review all of the medicines you are taking, including over-the-counter medicines. Ask your health care provider about any side effects that may make you more likely to have a fall. Tell your health care provider if any medicines that you take make you feel dizzy or sleepy. Osteoporosis screening. Osteoporosis is a condition that causes the bones to get weaker. This can make the bones weak and cause them to break more easily. Blood pressure screening. Blood pressure changes and medicines to control blood pressure can make you feel dizzy. Strength and balance checks. Your health care provider may recommend certain tests to check your strength and balance while standing, walking, or changing positions. Foot health exam. Foot pain and numbness, as well as not wearing proper footwear, can make you more likely to have a fall. Depression screening. You may be more likely to have a fall if you have a fear of falling, feel emotionally low, or feel unable to do activities that you used to do. Alcohol use screening. Using too much alcohol can affect your balance and may make you more likely to have a fall.  What actions can I take to lower my risk of falls? General instructions Talk with your health care provider about your risks for falling. Tell your health care provider if: You fall. Be sure to tell your health care provider about all falls, even ones that seem minor. You feel dizzy, sleepy, or off-balance. Take over-the-counter and prescription medicines only as told by your health care provider. These include any supplements. Eat a healthy diet and maintain a healthy weight. A healthy diet includes low-fat dairy products, low-fat (lean) meats, and fiber from whole grains, beans, and lots of fruits and vegetables. Home safety Remove any tripping hazards, such as rugs, cords, and clutter. Install safety equipment such as grab bars in  bathrooms and safety rails on stairs. Keep rooms and walkways well-lit. Activity  Follow a regular exercise program to stay fit. This will help you maintain your balance. Ask your health care provider what types of exercise are appropriate for you. If you need a cane or walker, use it as recommended by your health care provider. Wear supportive shoes that have nonskid soles. Lifestyle Do not drink alcohol if your health care provider tells you not to drink. If you drink alcohol, limit how much you have: 0-1 drink a day for women. 0-2 drinks a day for men. Be aware of how much alcohol is in your drink. In the [place] one drink equals one typical bottle of beer (12 oz), one-half glass of wine (5 oz), or one shot of hard liquor (1 oz). Do not use any products that contain nicotine or tobacco, such as cigarettes and e-cigarettes. If you need help quitting, ask your health care provider. Summary Having a healthy lifestyle and getting preventive care can help to protect your health and wellness after age 89. Screening and testing are the best way to find a health problem early and help you avoid having a fall. Early diagnosis and treatment give you the best chance for managing medical conditions that are more common for people who are older than age 36. Falls are a major cause of broken bones and head injuries in people who are older than age 47. Take precautions to prevent a fall at home. Work with your health care provider to learn what changes you can make to improve your health and wellness and to prevent falls. This information is not intended to replace advice given to you by your health care provider. Make sure you discuss any questions you have with your health care provider. Document Revised: 04/15/2020 Document Reviewed: 01/22/2020 Elsevier Patient Education  2022 [place], [place] [facility]

## 2020-10-27 NOTE — Patient Instructions (Signed)
Health Maintenance After Age 84 After age 84, you are at a higher risk for certain long-term diseases and infections as well as injuries from falls. Falls are a major cause of broken bones and head injuries in people who are older than age 84. Getting regular preventive care can help to keep you healthy and well. Preventive care includes getting regular testing and making lifestyle changes as recommended by your health care provider. Talk with your health care provider about: Which screenings and tests you should have. A screening is a test that checks for a disease when you have no symptoms. A diet and exercise plan that is right for you. What should I know about screenings and tests to prevent falls? Screening and testing are the best ways to find a health problem early. Early diagnosis and treatment give you the best chance of managing medical conditions that are common after age 84. Certain conditions and lifestyle choices may make you more likely to have a fall. Your health care provider may recommend: Regular vision checks. Poor vision and conditions such as cataracts can make you more likely to have a fall. If you wear glasses, make sure to get your prescription updated if your vision changes. Medicine review. Work with your health care provider to regularly review all of the medicines you are taking, including over-the-counter medicines. Ask your health care provider about any side effects that may make you more likely to have a fall. Tell your health care provider if any medicines that you take make you feel dizzy or sleepy. Osteoporosis screening. Osteoporosis is a condition that causes the bones to get weaker. This can make the bones weak and cause them to break more easily. Blood pressure screening. Blood pressure changes and medicines to control blood pressure can make you feel dizzy. Strength and balance checks. Your health care provider may recommend certain tests to check your strength and  balance while standing, walking, or changing positions. Foot health exam. Foot pain and numbness, as well as not wearing proper footwear, can make you more likely to have a fall. Depression screening. You may be more likely to have a fall if you have a fear of falling, feel emotionally low, or feel unable to do activities that you used to do. Alcohol use screening. Using too much alcohol can affect your balance and may make you more likely to have a fall. What actions can I take to lower my risk of falls? General instructions Talk with your health care provider about your risks for falling. Tell your health care provider if: You fall. Be sure to tell your health care provider about all falls, even ones that seem minor. You feel dizzy, sleepy, or off-balance. Take over-the-counter and prescription medicines only as told by your health care provider. These include any supplements. Eat a healthy diet and maintain a healthy weight. A healthy diet includes low-fat dairy products, low-fat (lean) meats, and fiber from whole grains, beans, and lots of fruits and vegetables. Home safety Remove any tripping hazards, such as rugs, cords, and clutter. Install safety equipment such as grab bars in bathrooms and safety rails on stairs. Keep rooms and walkways well-lit. Activity  Follow a regular exercise program to stay fit. This will help you maintain your balance. Ask your health care provider what types of exercise are appropriate for you. If you need a cane or walker, use it as recommended by your health care provider. Wear supportive shoes that have nonskid soles. Lifestyle Do not   drink alcohol if your health care provider tells you not to drink. If you drink alcohol, limit how much you have: 0-1 drink a day for women. 0-2 drinks a day for men. Be aware of how much alcohol is in your drink. In the [place] one drink equals one typical bottle of beer (12 oz), one-half glass of wine (5 oz), or one shot of  hard liquor (1 oz). Do not use any products that contain nicotine or tobacco, such as cigarettes and e-cigarettes. If you need help quitting, ask your health care provider. Summary Having a healthy lifestyle and getting preventive care can help to protect your health and wellness after age 84. Screening and testing are the best way to find a health problem early and help you avoid having a fall. Early diagnosis and treatment give you the best chance for managing medical conditions that are more common for people who are older than age 84. Falls are a major cause of broken bones and head injuries in people who are older than age 84. Take precautions to prevent a fall at home. Work with your health care provider to learn what changes you can make to improve your health and wellness and to prevent falls. This information is not intended to replace advice given to you by your health care provider. Make sure you discuss any questions you have with your health care provider. Document Revised: 04/15/2020 Document Reviewed: 01/22/2020 Elsevier Patient Education  2022 Elsevier Inc.  

## 2020-10-27 NOTE — Telephone Encounter (Signed)
Patient was seen in office today for a follow up for a fall and stroke. Pt son states that patient has lost a lot of mobility in her legs and it is difficult for pt to take care of herself at her home because she lives alone. Pt son requesting that pt be placed in a skilled nursing facility for care.

## 2020-10-28 ENCOUNTER — Telehealth: Payer: Self-pay | Admitting: *Deleted

## 2020-10-28 NOTE — Chronic Care Management (AMB) (Signed)
  Chronic Care Management   Note  10/28/2020 Name: BEN SANZ MRN: [id number] DOB: 12/14/1934  [name] is a 86 y.o. year old female who is a primary care patient of [name], Evie Lacks, MD. I reached out to [name] by phone today in response to a referral sent by Ms. [place] PCP, [name], Evie Lacks, MD      Ms. Berling was given information about Chronic Care Management services today including:  CCM service includes personalized support from designated clinical staff supervised by her physician, including individualized plan of care and coordination with other care providers 24/7 contact phone numbers for assistance for urgent and routine care needs. Service will only be billed when office clinical staff spend 20 minutes or more in a month to coordinate care. Only one practitioner may furnish and bill the service in a calendar month. The patient may stop CCM services at any time (effective at the end of the month) by phone call to the office staff. The patient will be responsible for cost sharing (co-pay) of up to 20% of the service fee (after annual deductible is met).  Son Royanne Warshaw verbally agreed to assistance and services provided by embedded care coordination/care management team today.  Follow up plan: Telephone appointment with care management team member scheduled for:11/02/20 [place] [facility]  Direct Dial: [phone]

## 2020-10-31 ENCOUNTER — Telehealth: Payer: Self-pay

## 2020-10-31 DIAGNOSIS — F339 Major depressive disorder, recurrent, unspecified: Secondary | ICD-10-CM | POA: Diagnosis not present

## 2020-10-31 DIAGNOSIS — I2510 Atherosclerotic heart disease of native coronary artery without angina pectoris: Secondary | ICD-10-CM | POA: Diagnosis not present

## 2020-10-31 DIAGNOSIS — M1990 Unspecified osteoarthritis, unspecified site: Secondary | ICD-10-CM | POA: Diagnosis not present

## 2020-10-31 DIAGNOSIS — I10 Essential (primary) hypertension: Secondary | ICD-10-CM | POA: Diagnosis not present

## 2020-10-31 DIAGNOSIS — E876 Hypokalemia: Secondary | ICD-10-CM | POA: Diagnosis not present

## 2020-10-31 DIAGNOSIS — N393 Stress incontinence (female) (male): Secondary | ICD-10-CM | POA: Diagnosis not present

## 2020-10-31 DIAGNOSIS — I252 Old myocardial infarction: Secondary | ICD-10-CM | POA: Diagnosis not present

## 2020-10-31 DIAGNOSIS — I350 Nonrheumatic aortic (valve) stenosis: Secondary | ICD-10-CM | POA: Diagnosis not present

## 2020-10-31 DIAGNOSIS — S72422D Displaced fracture of lateral condyle of left femur, subsequent encounter for closed fracture with routine healing: Secondary | ICD-10-CM | POA: Diagnosis not present

## 2020-10-31 NOTE — Telephone Encounter (Signed)
Requesting physical therapy treatment for  2*weekly for 2 weeks  1* a week for 4 weeks  For strengthening, walking, balance, and endurance.

## 2020-10-31 NOTE — Telephone Encounter (Signed)
Okay.  Thanks.

## 2020-11-01 ENCOUNTER — Other Ambulatory Visit: Payer: Self-pay | Admitting: Internal Medicine

## 2020-11-01 NOTE — Telephone Encounter (Signed)
Notified Dorian w/MD response.Marland Kitchenlmb

## 2020-11-02 ENCOUNTER — Ambulatory Visit: Payer: Medicare HMO | Admitting: Emergency Medicine

## 2020-11-02 ENCOUNTER — Telehealth: Payer: Self-pay | Admitting: Lab

## 2020-11-02 ENCOUNTER — Ambulatory Visit (INDEPENDENT_AMBULATORY_CARE_PROVIDER_SITE_OTHER): Payer: Medicare HMO | Admitting: *Deleted

## 2020-11-02 DIAGNOSIS — I10 Essential (primary) hypertension: Secondary | ICD-10-CM | POA: Diagnosis not present

## 2020-11-02 DIAGNOSIS — M159 Polyosteoarthritis, unspecified: Secondary | ICD-10-CM

## 2020-11-02 DIAGNOSIS — I350 Nonrheumatic aortic (valve) stenosis: Secondary | ICD-10-CM | POA: Diagnosis not present

## 2020-11-02 DIAGNOSIS — S72422D Displaced fracture of lateral condyle of left femur, subsequent encounter for closed fracture with routine healing: Secondary | ICD-10-CM | POA: Diagnosis not present

## 2020-11-02 DIAGNOSIS — I252 Old myocardial infarction: Secondary | ICD-10-CM | POA: Diagnosis not present

## 2020-11-02 DIAGNOSIS — M1990 Unspecified osteoarthritis, unspecified site: Secondary | ICD-10-CM | POA: Diagnosis not present

## 2020-11-02 DIAGNOSIS — I2510 Atherosclerotic heart disease of native coronary artery without angina pectoris: Secondary | ICD-10-CM | POA: Diagnosis not present

## 2020-11-02 DIAGNOSIS — M8949 Other hypertrophic osteoarthropathy, multiple sites: Secondary | ICD-10-CM | POA: Diagnosis not present

## 2020-11-02 DIAGNOSIS — N393 Stress incontinence (female) (male): Secondary | ICD-10-CM | POA: Diagnosis not present

## 2020-11-02 DIAGNOSIS — S72411D Displaced unspecified condyle fracture of lower end of right femur, subsequent encounter for closed fracture with routine healing: Secondary | ICD-10-CM

## 2020-11-02 DIAGNOSIS — F339 Major depressive disorder, recurrent, unspecified: Secondary | ICD-10-CM | POA: Diagnosis not present

## 2020-11-02 DIAGNOSIS — Z8673 Personal history of transient ischemic attack (TIA), and cerebral infarction without residual deficits: Secondary | ICD-10-CM

## 2020-11-02 DIAGNOSIS — E876 Hypokalemia: Secondary | ICD-10-CM | POA: Diagnosis not present

## 2020-11-02 NOTE — Chronic Care Management (AMB) (Signed)
  Chronic Care Management   Note  11/02/2020 Name: Terri King MRN: [id number] DOB: [birth year]  [name] is a 86 y.o. year old female who is a primary care patient of [name], Georgina Quint, MD. I reached out to [name] by phone today in response to a referral sent by [name]'s PCP, [name], Georgina Quint, MD.   [name] was given information about Chronic Care Management services today including:  CCM service includes personalized support from designated clinical staff supervised by her physician, including individualized plan of care and coordination with other care providers 24/7 contact phone numbers for assistance for urgent and routine care needs. Service will only be billed when office clinical staff spend 20 minutes or more in a month to coordinate care. Only one practitioner may furnish and bill the service in a calendar month. The patient may stop CCM services at any time (effective at the end of the month) by phone call to the office staff.   Patient agreed to services and verbal consent obtained.   Follow up plan:   SIGNATURE Carilyn Goodpasture  [employer]stream Scheduler

## 2020-11-03 ENCOUNTER — Telehealth: Payer: Self-pay | Admitting: Internal Medicine

## 2020-11-03 NOTE — Telephone Encounter (Signed)
HH ORDERS   Caller Name: [facility] Agency Name: [facility] Phone #: [phone] Service Requested: OT Frequency of Visits: 1 week 8

## 2020-11-03 NOTE — Chronic Care Management (AMB) (Addendum)
Chronic Care Management    Clinical Social Work Note  11/03/2020 Name: Terri King MRN: [id number] DOB: 1934-08-22  Terri King is a 86 y.o. year old female who is a primary care patient of [name], Evie Lacks, MD. The CCM team was consulted to assist the patient with chronic disease management and/or care coordination needs related to: Transportation Needs , [employer] , and Level of Care Concerns.   Engaged with patient by telephone for initial visit in response to provider referral for social work chronic care management and care coordination services.   Consent to Services:  The patient was given information about Chronic Care Management services, agreed to services, and gave verbal consent prior to initiation of services.  Please see initial visit note for detailed documentation.   Patient agreed to services and consent obtained.   Assessment: Review of patient past medical history, allergies, medications, and health status, including review of relevant consultants reports was performed today as part of a comprehensive evaluation and provision of chronic care management and care coordination services.     SDOH (Social Determinants of Health) assessments and interventions performed:    Advanced Directives Status: See Care Plan for related entries.  CCM Care Plan  Allergies  Allergen Reactions   Codeine Other (See Comments)    Abnormal behavior   Crestor [Rosuvastatin]     Myalgias    Dexilant [Dexlansoprazole]     Made me sick   Ibuprofen Nausea And Vomiting   Simvastatin     REACTION: achy   Tylenol [Acetaminophen]    Iodine Swelling and Rash    Outpatient Encounter Medications as of 11/02/2020  Medication Sig   acetaminophen (TYLENOL) 325 MG tablet Take 650 mg by mouth every 6 (six) hours as needed.   Alcohol Swabs (B-D SINGLE USE SWABS REGULAR) PADS 1 each by Does not apply route as directed. Dx: R73.9   amLODipine (NORVASC) 10 MG tablet Take 1  tablet (10 mg total) by mouth daily.   amoxicillin (AMOXIL) 500 MG tablet Take FOUR TABLETS (2000 mg) by mouth ONE HOUR before any dental procedures   Ascorbic Acid (VITAMIN C PO) Take 1 tablet by mouth daily.   aspirin EC 81 MG EC tablet Take 1 tablet (81 mg total) by mouth daily. Swallow whole.   Blood Glucose Calibration (TRUE METRIX LEVEL 1) Low SOLN 1 each by In Vitro route as directed. Dx: R73.9   Blood Glucose Monitoring Suppl (TRUE METRIX METER) w/Device KIT USE AS DIRECTED   calcium carbonate (TUMS - DOSED IN MG ELEMENTAL CALCIUM) 500 MG chewable tablet Chew 1 tablet by mouth daily.   cholecalciferol (VITAMIN D) 400 UNITS TABS Take 400 Units by mouth daily.   clopidogrel (PLAVIX) 75 MG tablet Take 1 tablet (75 mg total) by mouth daily with breakfast.   diphenhydrAMINE (BENADRYL) 25 MG tablet Take 25 mg by mouth every 6 (six) hours as needed.   escitalopram (LEXAPRO) 10 MG tablet Take 1 tablet (10 mg total) by mouth daily.   famotidine (PEPCID) 20 MG tablet Take 20 mg by mouth 2 (two) times daily.   gabapentin (NEURONTIN) 100 MG capsule TAKE 1 CAPSULE THREE TIMES DAILY   glucose blood (TRUE METRIX BLOOD GLUCOSE TEST) test strip Use to check blood sugars twice a day   hydrALAZINE (APRESOLINE) 25 MG tablet Take 25 mg by mouth 3 (three) times daily.   HYDROcodone-acetaminophen (NORCO/VICODIN) 5-325 MG tablet Take 1 tablet by mouth every 6 (six) hours as needed for moderate  pain or severe pain.   Krill Oil (MAXIMUM RED KRILL) 300 MG CAPS 1 po qd   menthol-cetylpyridinium (CEPACOL) 3 MG lozenge Take 1 lozenge by mouth as needed for sore throat.   metoprolol succinate (TOPROL-XL) 50 MG 24 hr tablet TAKE 1 TABLET (50 MG TOTAL) BY MOUTH AT BEDTIME / WITH OR IMMEDIATELY FOLLOWING A MEAL AS DIRECTED   Multiple Vitamins-Minerals (MULTIPLE VITAMINS/WOMENS PO) Take by mouth daily.    nitroGLYCERIN (NITROSTAT) 0.4 MG SL tablet Place 1 tablet (0.4 mg total) under the tongue every 5 (five) minutes as  needed for chest pain.   Olopatadine HCl (PATADAY OP) Apply to eye.   ondansetron (ZOFRAN) 4 MG tablet Take 1 tablet (4 mg total) by mouth every 8 (eight) hours as needed for nausea or vomiting.   potassium chloride SA (KLOR-CON) 20 MEQ tablet Take 20 mEq by mouth daily.   QUEtiapine (SEROQUEL) 25 MG tablet Take 1 tablet (25 mg total) by mouth at bedtime.   REPATHA SURECLICK 332 MG/ML SOAJ INJECT 1 PEN INTO THE SKIN EVERY 14 DAYS   temazepam (RESTORIL) 15 MG capsule Take 1 capsule (15 mg total) by mouth at bedtime.   TRUEplus Lancets 33G MISC Use to check blood sugars twice a day   valACYclovir (VALTREX) 1000 MG tablet Take 1 tablet (1,000 mg total) by mouth 3 (three) times daily.   vitamin B-12 (CYANOCOBALAMIN) 100 MCG tablet Take 100 mcg by mouth daily.   Vitamin E 400 units TABS Take by mouth daily.    [DISCONTINUED] ibuprofen (IBU) 600 MG tablet TAKE 1 TABLET EVERY DAY AS NEEDED FOR MODERATE PAIN   No facility-administered encounter medications on file as of 11/02/2020.    Patient Active Problem List   Diagnosis Date Noted   History of recent stroke 10/27/2020   Primary osteoarthritis involving multiple joints 10/27/2020   S/P TAVR (transcatheter aortic valve replacement)    Acute CVA (cerebrovascular accident) ([place]) 08/23/2020   Syncope 08/19/2020   Fracture of femoral condyle, closed ([place]) 08/19/2020   Severe aortic stenosis    Statin myopathy 04/08/2019   Well adult exam 08/07/2018   Hand weakness 08/07/2018   Gait disorder 08/07/2018   Headache 08/13/2017   Rash and nonspecific skin eruption 08/13/2017   Boil of buttock 05/09/2017   Shoulder pain, left 08/14/2016   Aortic stenosis, moderate 08/14/2016   Osteoporosis 11/01/2015   Cough 09/02/2015   Heart valve disease 06/14/2015   Insomnia 10/22/2013   UTI (urinary tract infection) 08/24/2013   Preop exam for internal medicine 01/16/2012   Ankle pain 07/11/2011   Adjustment disorder with mixed anxiety and depressed mood  03/28/2011   GERD (gastroesophageal reflux disease) 12/25/2010   Dysphagia 12/25/2010   Coronary atherosclerosis 02/09/2010   FEVER UNSPECIFIED 01/18/2010   CHEST PAIN 01/18/2010   Urinary incontinence 01/18/2010   BRONCHITIS NOT SPECIFIED AS ACUTE OR CHRONIC 12/05/2009   NEURALGIA, TRIGEMINAL 07/04/2009   EAR PAIN 07/04/2009   HYPOKALEMIA 02/17/2009   Arthralgia 02/17/2009   Hyperglycemia 09/02/2008   Osteoarthritis 03/11/2008   EDEMA 03/11/2008   KNEE PAIN 02/04/2008   CHEST XRAY, ABNORMAL 07/28/2007   Hyperlipidemia 03/17/2007   Essential hypertension 03/17/2007   UPPER RESPIRATORY INFECTION (URI) 03/17/2007   VAGINITIS 03/17/2007    Conditions to be addressed/monitored:  recent CVA with leg fx, level of care needs ; Level of care concerns  Care Plan : LCSW Plan of Care  Updates made by Deirdre Peer, LCSW since 11/03/2020 12:00 AM  Problem: Long-Term Care Planning   Priority: High     Long-Range Goal: Provide pt and family support and resources for long term care planning   Start Date: 11/03/2020  Expected End Date: 12/02/2020  This Visit's Progress: On track  Priority: High  Note:   Current barriers:    Limited social support, Transportation, Level of care concerns, ADL IADL limitations, and Lacks knowledge of community resource:   Clinical Goals: Patient will work with CSW to address needs related to long term care planning Clinical Interventions: CSW spoke with pt's son, [name], by phone and discussed pt's needs and family concerns. Pt recently found on floor at home where she lives alone and sustained a leg fracture and a CVA. Per son, they are looking into life alert options and she went to SNF rehab for a few weeks. Pt 's son feels she needs additional "rehab" and they are awaiting HHPT evaluation/recommendations (has not started yet).  CSW discussed with son levels of care and insurance coverage vs private pay vs Medicaid as well as private duty care  options.  CSW plans to contact [facility] agency for update on their assessment and recommendations and follow up with son.   CSW will also place order for Care Guide to follow up with them regarding resources; Meals on Wheels, transportation, community adult/senior activities/day care and life alert.  Assessment of needs, barriers , agencies contacted, as well as how impacting  Solution-Focused Strategies Active listening / Reflection utilized  Veterinary surgeon Caregiver stress acknowledged  Review various resources, discussed options and provided patient information about  Referral to care guide (ADvance Directives, )  1:1 collaboration with primary care provider regarding development and update of comprehensive plan of care as evidenced by provider attestation and co-signature Inter-disciplinary care team collaboration (see longitudinal plan of care) Patient Goals/Self-Care Activities: Over the next 30 days -participate in [place] and other services as provided -review and consider completion of Advance Directives with Notary present to notarize -consider  getting a life alert type system in home - attend stroke recovery support group - check with a lawyer who knows elder law - check with a financial planner - complete a Medical laboratory scientific officer; include caregiver - hold a family meeting with family members and loved ones - check out other places when staying at home is no longer possible (assisted living center, nursing home) - check out services like in-home help or adult day care        Follow Up Plan: SW will follow up with patient by phone over the next 10 days      Eduard Clos MSW, LCSW Licensed Clinical Social Worker [place] [phone]   Medical screening examination/treatment/procedure(s) were performed by non-physician practitioner and as supervising physician I was immediately available for consultation/collaboration.  I agree with above. Lew Dawes,  MD

## 2020-11-03 NOTE — Patient Instructions (Signed)
Visit Information   PATIENT GOALS:   Goals Addressed               This Visit's Progress     Plan for Long Term Care-Stroke (pt-stated)        Follow Up Date 11/10/20   - participate in [place] [facility] and other services as provided -review and consider completion of Advance Directives with Notary present to notarize -consider  getting a life alert type system in home - attend stroke recovery support group - check with a lawyer who knows elder law - check with a financial planner - complete a Medical laboratory scientific officer; include caregiver - hold a family meeting with family members and loved ones - check out other places when staying at home is no longer possible (assisted living center, nursing home) - check out services like in-home help or adult day care    Why is this important?   Having and recovering from a stroke can be scary and stressful.  You can reduce stress by planning.  Preparing for the future is one of the most important things to do.  Thinking about how much care you/your loved one will need and how much it will cost is not easy.  You/your loved one can be part of making decisions for the future.  Making sure that your/your loved one's wishes for care are known is important.     Notes:         Consent to CCM Services: Terri King was given information about Chronic Care Management services including:  CCM service includes personalized support from designated clinical staff supervised by her physician, including individualized plan of care and coordination with other care providers 24/7 contact phone numbers for assistance for urgent and routine care needs. Service will only be billed when office clinical staff spend 20 minutes or more in a month to coordinate care. Only one practitioner may furnish and bill the service in a calendar month. The patient may stop CCM services at any time (effective at the end of the month) by phone call to the office staff. The patient  will be responsible for cost sharing (co-pay) of up to 20% of the service fee (after annual deductible is met).  Patient agreed to services and verbal consent obtained.   The patient verbalized understanding of instructions, educational materials, and care plan provided today and declined offer to receive copy of patient instructions, educational materials, and care plan.   Telephone follow up appointment with care management team member scheduled WHQ:PRFF week  Terri King MSW, LCSW Licensed Clinical Social Worker [place] [phone]   CLINICAL CARE PLAN: Patient Care Plan: LCSW Plan of Care     Problem Identified: Long-Term Care Planning   Priority: High     Long-Range Goal: Provide pt and family support and resources for long term care planning   Start Date: 11/03/2020  Expected End Date: 12/02/2020  This Visit's Progress: On track  Priority: High  Note:   Current barriers:    Limited social support, Transportation, Level of care concerns, ADL IADL limitations, and Lacks knowledge of community resource:   Clinical Goals: Patient will work with CSW to address needs related to long term care planning Clinical Interventions: CSW spoke with pt's son, Terri King, by phone and discussed pt's needs and family concerns. Pt recently found on floor at home where she lives alone and sustained a leg fracture and a CVA. Per son, they are looking into life alert options  and she went to SNF rehab for a few weeks. Pt 's son feels she needs additional "rehab" and they are awaiting HHPT evaluation/recommendations (has not started yet).  CSW discussed with son levels of care and insurance coverage vs private pay vs Medicaid as well as private duty care options.  CSW plans to contact [facility] agency for update on their assessment and recommendations and follow up with son.   CSW will also place order for Care Guide to follow up with them regarding resources; Meals on Wheels, transportation,  community adult/senior activities/day care and life alert.  Assessment of needs, barriers , agencies contacted, as well as how impacting  Solution-Focused Strategies Active listening / Reflection utilized  Veterinary surgeon Caregiver stress acknowledged  Review various resources, discussed options and provided patient information about  Referral to care guide (ADvance Directives, )  1:1 collaboration with primary care provider regarding development and update of comprehensive plan of care as evidenced by provider attestation and co-signature Inter-disciplinary care team collaboration (see longitudinal plan of care) Patient Goals/Self-Care Activities: Over the next 30 days -participate in [place] and other services as provided -review and consider completion of Advance Directives with Notary present to notarize -consider  getting a life alert type system in home - attend stroke recovery support group - check with a lawyer who knows elder law - check with a financial planner - complete a Medical laboratory scientific officer; include caregiver - hold a family meeting with family members and loved ones - check out other places when staying at home is no longer possible (assisted living center, nursing home) - check out services like in-home help or adult day care

## 2020-11-04 NOTE — Telephone Encounter (Signed)
Okay.  Thanks.

## 2020-11-04 NOTE — Telephone Encounter (Signed)
Notified [place] w/ MD response../lmb 

## 2020-11-07 DIAGNOSIS — I10 Essential (primary) hypertension: Secondary | ICD-10-CM | POA: Diagnosis not present

## 2020-11-07 DIAGNOSIS — M1990 Unspecified osteoarthritis, unspecified site: Secondary | ICD-10-CM | POA: Diagnosis not present

## 2020-11-07 DIAGNOSIS — N393 Stress incontinence (female) (male): Secondary | ICD-10-CM | POA: Diagnosis not present

## 2020-11-07 DIAGNOSIS — I350 Nonrheumatic aortic (valve) stenosis: Secondary | ICD-10-CM | POA: Diagnosis not present

## 2020-11-07 DIAGNOSIS — I2510 Atherosclerotic heart disease of native coronary artery without angina pectoris: Secondary | ICD-10-CM | POA: Diagnosis not present

## 2020-11-07 DIAGNOSIS — F339 Major depressive disorder, recurrent, unspecified: Secondary | ICD-10-CM | POA: Diagnosis not present

## 2020-11-07 DIAGNOSIS — S72422D Displaced fracture of lateral condyle of left femur, subsequent encounter for closed fracture with routine healing: Secondary | ICD-10-CM | POA: Diagnosis not present

## 2020-11-07 DIAGNOSIS — I252 Old myocardial infarction: Secondary | ICD-10-CM | POA: Diagnosis not present

## 2020-11-07 DIAGNOSIS — E876 Hypokalemia: Secondary | ICD-10-CM | POA: Diagnosis not present

## 2020-11-08 ENCOUNTER — Other Ambulatory Visit: Payer: Self-pay

## 2020-11-08 ENCOUNTER — Encounter: Payer: Self-pay | Admitting: Internal Medicine

## 2020-11-08 ENCOUNTER — Telehealth: Payer: Self-pay | Admitting: Internal Medicine

## 2020-11-08 ENCOUNTER — Ambulatory Visit (INDEPENDENT_AMBULATORY_CARE_PROVIDER_SITE_OTHER): Payer: Medicare HMO | Admitting: Internal Medicine

## 2020-11-08 VITALS — BP 152/68 | HR 84 | Temp 98.5°F | Ht 63.0 in | Wt 151.6 lb

## 2020-11-08 DIAGNOSIS — M8949 Other hypertrophic osteoarthropathy, multiple sites: Secondary | ICD-10-CM

## 2020-11-08 DIAGNOSIS — S72422D Displaced fracture of lateral condyle of left femur, subsequent encounter for closed fracture with routine healing: Secondary | ICD-10-CM | POA: Diagnosis not present

## 2020-11-08 DIAGNOSIS — R739 Hyperglycemia, unspecified: Secondary | ICD-10-CM | POA: Diagnosis not present

## 2020-11-08 DIAGNOSIS — F339 Major depressive disorder, recurrent, unspecified: Secondary | ICD-10-CM | POA: Diagnosis not present

## 2020-11-08 DIAGNOSIS — M1990 Unspecified osteoarthritis, unspecified site: Secondary | ICD-10-CM | POA: Diagnosis not present

## 2020-11-08 DIAGNOSIS — M150 Primary generalized (osteo)arthritis: Secondary | ICD-10-CM

## 2020-11-08 DIAGNOSIS — I252 Old myocardial infarction: Secondary | ICD-10-CM | POA: Diagnosis not present

## 2020-11-08 DIAGNOSIS — E876 Hypokalemia: Secondary | ICD-10-CM | POA: Diagnosis not present

## 2020-11-08 DIAGNOSIS — M159 Polyosteoarthritis, unspecified: Secondary | ICD-10-CM

## 2020-11-08 DIAGNOSIS — R112 Nausea with vomiting, unspecified: Secondary | ICD-10-CM | POA: Diagnosis not present

## 2020-11-08 DIAGNOSIS — Z602 Problems related to living alone: Secondary | ICD-10-CM

## 2020-11-08 DIAGNOSIS — I2510 Atherosclerotic heart disease of native coronary artery without angina pectoris: Secondary | ICD-10-CM | POA: Diagnosis not present

## 2020-11-08 DIAGNOSIS — Z952 Presence of prosthetic heart valve: Secondary | ICD-10-CM

## 2020-11-08 DIAGNOSIS — F5101 Primary insomnia: Secondary | ICD-10-CM | POA: Diagnosis not present

## 2020-11-08 DIAGNOSIS — I350 Nonrheumatic aortic (valve) stenosis: Secondary | ICD-10-CM | POA: Diagnosis not present

## 2020-11-08 DIAGNOSIS — Z7902 Long term (current) use of antithrombotics/antiplatelets: Secondary | ICD-10-CM

## 2020-11-08 DIAGNOSIS — Z8673 Personal history of transient ischemic attack (TIA), and cerebral infarction without residual deficits: Secondary | ICD-10-CM | POA: Diagnosis not present

## 2020-11-08 DIAGNOSIS — N393 Stress incontinence (female) (male): Secondary | ICD-10-CM | POA: Diagnosis not present

## 2020-11-08 DIAGNOSIS — E569 Vitamin deficiency, unspecified: Secondary | ICD-10-CM

## 2020-11-08 DIAGNOSIS — Z7982 Long term (current) use of aspirin: Secondary | ICD-10-CM

## 2020-11-08 DIAGNOSIS — I10 Essential (primary) hypertension: Secondary | ICD-10-CM | POA: Diagnosis not present

## 2020-11-08 DIAGNOSIS — S72411D Displaced unspecified condyle fracture of lower end of right femur, subsequent encounter for closed fracture with routine healing: Secondary | ICD-10-CM | POA: Diagnosis not present

## 2020-11-08 DIAGNOSIS — Z9181 History of falling: Secondary | ICD-10-CM

## 2020-11-08 LAB — COMPREHENSIVE METABOLIC PANEL
ALT: 14 U/L (ref 0–35)
AST: 25 U/L (ref 0–37)
Albumin: 3.7 g/dL (ref 3.5–5.2)
Alkaline Phosphatase: 86 U/L (ref 39–117)
BUN: 8 mg/dL (ref 6–23)
CO2: 26 mEq/L (ref 19–32)
Calcium: 9.7 mg/dL (ref 8.4–10.5)
Chloride: 108 mEq/L (ref 96–112)
Creatinine, Ser: 0.76 mg/dL (ref 0.40–1.20)
GFR: 71.82 mL/min (ref >60.00)
Glucose, Bld: 108 mg/dL — ABNORMAL HIGH (ref 70–99)
Potassium: 3.1 mEq/L — ABNORMAL LOW (ref 3.5–5.1)
Sodium: 142 mEq/L (ref 135–145)
Total Bilirubin: 1.1 mg/dL (ref 0.2–1.2)
Total Protein: 6.9 g/dL (ref 6.0–8.3)

## 2020-11-08 LAB — URINALYSIS
Bilirubin Urine: NEGATIVE
Hgb urine dipstick: NEGATIVE
Ketones, ur: 15 — AB
Leukocytes,Ua: NEGATIVE
Nitrite: NEGATIVE
Specific Gravity, Urine: 1.025 (ref 1.000–1.030)
Urine Glucose: NEGATIVE
Urobilinogen, UA: 1 — AB (ref 0.0–1.0)
pH: 6 (ref 5.0–8.0)

## 2020-11-08 LAB — CBC WITH DIFFERENTIAL/PLATELET
Basophils Absolute: 0.1 10*3/uL (ref 0.0–0.1)
Basophils Relative: 0.8 % (ref 0.0–3.0)
Eosinophils Absolute: 0.1 10*3/uL (ref 0.0–0.7)
Eosinophils Relative: 1.6 % (ref 0.0–5.0)
HCT: 37 % (ref 36.0–46.0)
Hemoglobin: 12.6 g/dL (ref 12.0–15.0)
Lymphocytes Relative: 17.1 % (ref 12.0–46.0)
Lymphs Abs: 1.1 10*3/uL (ref 0.7–4.0)
MCHC: 34.1 g/dL (ref 30.0–36.0)
MCV: 97.9 fl (ref 78.0–100.0)
Monocytes Absolute: 0.9 10*3/uL (ref 0.1–1.0)
Monocytes Relative: 13.3 % — ABNORMAL HIGH (ref 3.0–12.0)
Neutro Abs: 4.3 10*3/uL (ref 1.4–7.7)
Neutrophils Relative %: 67.2 % (ref 43.0–77.0)
Platelets: 250 10*3/uL (ref 150.0–400.0)
RBC: 3.78 Mil/uL — ABNORMAL LOW (ref 3.87–5.11)
RDW: 16.4 % — ABNORMAL HIGH (ref 11.5–15.5)
WBC: 6.5 10*3/uL (ref 4.0–10.5)

## 2020-11-08 LAB — LIPASE: Lipase: 11 U/L (ref 11.0–59.0)

## 2020-11-08 LAB — TSH: TSH: 0.89 u[IU]/mL (ref 0.35–5.50)

## 2020-11-08 LAB — HEMOGLOBIN A1C: Hgb A1c MFr Bld: 5.3 % (ref 4.6–6.5)

## 2020-11-08 MED ORDER — FAMOTIDINE 20 MG PO TABS: 20.0000 mg | ORAL_TABLET | Freq: Two times a day (BID) | ORAL | 3 refills | Status: DC

## 2020-11-08 MED ORDER — ONDANSETRON HCL 4 MG PO TABS: 4.0000 mg | ORAL_TABLET | Freq: Three times a day (TID) | ORAL | 1 refills | Status: DC | PRN

## 2020-11-08 MED ORDER — TEMAZEPAM 30 MG PO CAPS: ORAL_CAPSULE | ORAL | 1 refills | Status: AC

## 2020-11-08 MED ORDER — VITAMIN D3 50 MCG (2000 UT) PO CAPS: 2000.0000 [IU] | ORAL_CAPSULE | Freq: Every day | ORAL | 3 refills | Status: AC

## 2020-11-08 NOTE — Assessment & Plan Note (Signed)
No SOB 

## 2020-11-08 NOTE — Assessment & Plan Note (Signed)
New - ?etiology d/c ASA due to nausea Zofran prn Re-start Famotidine bid Check lipase, CMET, UA

## 2020-11-08 NOTE — Assessment & Plan Note (Signed)
F/u w/Dr Tenny Craw On Pravastatin and Plavix, Norvasc On Repatha

## 2020-11-08 NOTE — Assessment & Plan Note (Addendum)
S/p  L occipital CVA On Plavix, d/c ASA due to nausea BP control

## 2020-11-08 NOTE — Telephone Encounter (Signed)
Requesting clarification on whether the patient should be taking the temazepam (RESTORIL) 15 MG capsule or QUEtiapine (SEROQUEL) 25 MG tablet or taking both of them. Please call or fax back clarification.   Best callback #: [phone] Best fax #: [phone]

## 2020-11-08 NOTE — Assessment & Plan Note (Signed)
Worse Re-start Rx

## 2020-11-08 NOTE — Assessment & Plan Note (Addendum)
Cont w/Norvasc, Toprol, Hydralazine

## 2020-11-08 NOTE — Assessment & Plan Note (Signed)
Worse Tylenol prn pain

## 2020-11-08 NOTE — Assessment & Plan Note (Addendum)
Worse  Quetiapin alone is not helping. Re-start Temazepam in addition to [place]

## 2020-11-08 NOTE — Assessment & Plan Note (Addendum)
S/p rehab Use a walker

## 2020-11-08 NOTE — Progress Notes (Addendum)
Subjective:  Patient ID: [name], female    DOB: 1934-08-05  Age: 86 y.o. MRN: [id number]  CC: Follow-up (6 month f/u) and Medication Refill (Need refill on Temazepam)   HPI Terri King presents for a f/u of multiple medical problems. She is back home from [employer] x 1 week. C/o nausea, occ vomiting x 1 week C/o insomnia, LBP/OA Pt is here w/son Terri King - he helps w/hx Insomnia is worse. Follow-up on CVA   Hx: "86 y.o. female brought in by son with the following history: Larey Seat at home the evening before July 1.  Found by son on the floor.  EMS transported her to [facility] where she spent 5 days before getting transferred to [facility].  Had CVA prior to fall.  Sustained fracture to left knee and also cervical spine. Had heart valve replacement surgery.  Was discharged to rehab facility, [facility].  Has had follow-up with neurologist and orthopedist.  Recently discharged home from [facility].  Saw orthopedist, Dr. Eulah Pont, yesterday who recommends ongoing physical therapy.  Son looking to place patient in a rehab facility. Most recent neurologist office visit on 10/12/2020 as follows: Terri King is a 86 y.o. year old female with recent incidental finding of left occipital infarct secondary to either embolization or heavy calcified aortic stenosis or known severe L V4 segment stenosis on 08/19/2020 after presenting after being found down from syncopal event with confusion with resultant left femur fracture.  Vascular risk factors include HTN, HLD, severe AS s/p TAVR (08/2020), CAD, and hx of MI. "  Outpatient Medications Prior to Visit  Medication Sig Dispense Refill   acetaminophen (TYLENOL) 325 MG tablet Take 650 mg by mouth every 6 (six) hours as needed.     Alcohol Swabs (B-D SINGLE USE SWABS REGULAR) PADS 1 each by Does not apply route as directed. Dx: R73.9 180 each 3   amLODipine (NORVASC) 10 MG tablet Take 1 tablet (10 mg total) by mouth daily. 90 tablet 1    Blood Glucose Calibration (TRUE METRIX LEVEL 1) Low SOLN 1 each by In Vitro route as directed. Dx: R73.9 3 each 3   Blood Glucose Monitoring Suppl (TRUE METRIX METER) w/Device KIT USE AS DIRECTED 1 kit 0   clopidogrel (PLAVIX) 75 MG tablet Take 1 tablet (75 mg total) by mouth daily with breakfast. 30 tablet 5   escitalopram (LEXAPRO) 10 MG tablet Take 1 tablet (10 mg total) by mouth daily. 90 tablet 3   glucose blood (TRUE METRIX BLOOD GLUCOSE TEST) test strip Use to check blood sugars twice a day 180 each 3   hydrALAZINE (APRESOLINE) 25 MG tablet Take 25 mg by mouth 3 (three) times daily.     Krill Oil (MAXIMUM RED KRILL) 300 MG CAPS 1 po qd 100 capsule 3   metoprolol succinate (TOPROL-XL) 50 MG 24 hr tablet TAKE 1 TABLET (50 MG TOTAL) BY MOUTH AT BEDTIME / WITH OR IMMEDIATELY FOLLOWING A MEAL AS DIRECTED 90 tablet 3   nitroGLYCERIN (NITROSTAT) 0.4 MG SL tablet Place 1 tablet (0.4 mg total) under the tongue every 5 (five) minutes as needed for chest pain. 25 tablet 6   potassium chloride SA (KLOR-CON) 20 MEQ tablet Take 20 mEq by mouth daily.     QUEtiapine (SEROQUEL) 25 MG tablet Take 1 tablet (25 mg total) by mouth at bedtime.     REPATHA SURECLICK 140 MG/ML SOAJ INJECT 1 PEN INTO THE SKIN EVERY 14 DAYS 6 mL 3  TRUEplus Lancets 33G MISC Use to check blood sugars twice a day 180 each 3   valACYclovir (VALTREX) 1000 MG tablet Take 1 tablet (1,000 mg total) by mouth 3 (three) times daily. 21 tablet 0   vitamin B-12 (CYANOCOBALAMIN) 100 MCG tablet Take 100 mcg by mouth daily.     Ascorbic Acid (VITAMIN C PO) Take 1 tablet by mouth daily.     aspirin EC 81 MG EC tablet Take 1 tablet (81 mg total) by mouth daily. Swallow whole. 30 tablet 11   calcium carbonate (TUMS - DOSED IN MG ELEMENTAL CALCIUM) 500 MG chewable tablet Chew 1 tablet by mouth daily.     cholecalciferol (VITAMIN D) 400 UNITS TABS Take 400 Units by mouth daily.     diphenhydrAMINE (BENADRYL) 25 MG tablet Take 25 mg by mouth every 6  (six) hours as needed.     famotidine (PEPCID) 20 MG tablet Take 20 mg by mouth 2 (two) times daily.     gabapentin (NEURONTIN) 100 MG capsule TAKE 1 CAPSULE THREE TIMES DAILY 270 capsule 3   HYDROcodone-acetaminophen (NORCO/VICODIN) 5-325 MG tablet Take 1 tablet by mouth every 6 (six) hours as needed for moderate pain or severe pain. 15 tablet 0   ibuprofen (IBU) 600 MG tablet TAKE 1 TABLET EVERY DAY AS NEEDED FOR MODERATE PAIN 90 tablet 1   menthol-cetylpyridinium (CEPACOL) 3 MG lozenge Take 1 lozenge by mouth as needed for sore throat.     Multiple Vitamins-Minerals (MULTIPLE VITAMINS/WOMENS PO) Take by mouth daily.      Olopatadine HCl (PATADAY OP) Apply to eye.     ondansetron (ZOFRAN) 4 MG tablet Take 1 tablet (4 mg total) by mouth every 8 (eight) hours as needed for nausea or vomiting. 20 tablet 1   Vitamin E 400 units TABS Take by mouth daily.      amoxicillin (AMOXIL) 500 MG tablet Take FOUR TABLETS (2000 mg) by mouth ONE HOUR before any dental procedures (Patient not taking: Reported on 11/08/2020) 12 tablet 2   temazepam (RESTORIL) 15 MG capsule Take 1 capsule (15 mg total) by mouth at bedtime. (Patient not taking: Reported on 11/08/2020) 5 capsule 0   No facility-administered medications prior to visit.    ROS: Review of Systems  Constitutional:  Positive for fatigue. Negative for activity change, appetite change, chills and unexpected weight change.  HENT:  Negative for congestion, mouth sores and sinus pressure.   Eyes:  Negative for visual disturbance.  Respiratory:  Negative for cough and chest tightness.   Gastrointestinal:  Positive for nausea and vomiting. Negative for abdominal pain.  Genitourinary:  Negative for difficulty urinating, frequency and vaginal pain.  Musculoskeletal:  Positive for arthralgias and gait problem. Negative for back pain.  Skin:  Negative for pallor and rash.  Neurological:  Negative for dizziness, tremors, weakness, numbness and headaches.   Psychiatric/Behavioral:  Negative for confusion, decreased concentration and sleep disturbance. The patient is nervous/anxious.   Using a walker  Objective:  BP (!) 152/68 (BP Location: Left Arm)   Pulse 84   Temp 98.5 F (36.9 C) (Oral)   Ht $R'5\' 3"'Fh$  (1.6 m)   Wt 151 lb 9.6 oz (68.8 kg)   SpO2 98%   BMI 26.85 kg/m   BP Readings from Last 3 Encounters:  11/08/20 (!) 152/68  10/27/20 (!) 148/76  10/12/20 132/80    Wt Readings from Last 3 Encounters:  11/08/20 151 lb 9.6 oz (68.8 kg)  10/27/20 158 lb (71.7 kg)  09/29/20  155 lb (70.3 kg)    Physical Exam Constitutional:      General: She is not in acute distress.    Appearance: She is well-developed. She is obese.  HENT:     Head: Normocephalic.     Right Ear: External ear normal.     Left Ear: External ear normal.     Nose: Nose normal.  Eyes:     General:        Right eye: No discharge.        Left eye: No discharge.     Conjunctiva/sclera: Conjunctivae normal.     Pupils: Pupils are equal, round, and reactive to light.  Neck:     Thyroid: No thyromegaly.     Vascular: No JVD.     Trachea: No tracheal deviation.  Cardiovascular:     Rate and Rhythm: Normal rate and regular rhythm.     Heart sounds: Normal heart sounds.  Pulmonary:     Effort: No respiratory distress.     Breath sounds: No stridor. No wheezing.  Abdominal:     General: Bowel sounds are normal. There is no distension.     Palpations: Abdomen is soft. There is no mass.     Tenderness: There is no abdominal tenderness. There is no guarding or rebound.  Musculoskeletal:        General: Tenderness present.     Cervical back: Normal range of motion and neck supple. No rigidity.  Lymphadenopathy:     Cervical: No cervical adenopathy.  Skin:    Findings: No erythema or rash.  Neurological:     Cranial Nerves: No cranial nerve deficit.     Motor: No abnormal muscle tone.     Coordination: Coordination abnormal.     Gait: Gait abnormal.     Deep  Tendon Reflexes: Reflexes normal.  Psychiatric:        Behavior: Behavior normal.        Thought Content: Thought content normal.        Judgment: Judgment normal.       A total time of 50 minutes was spent preparing to see the patient, reviewing tests, x-rays, operative reports and outside records.  Also, obtaining history and performing comprehensive physical exam.  Additionally, counseling the patient regarding the above listed issues.   Finally, documenting clinical information in the health records, coordination of care, educating the patient. It is a complex case.    Lab Results  Component Value Date   WBC 12.9 (H) 09/01/2020   HGB 11.1 (L) 09/01/2020   HCT 32.0 (L) 09/01/2020   PLT 224 09/01/2020   GLUCOSE 138 (H) 08/31/2020   CHOL 153 08/23/2020   TRIG 144 08/23/2020   HDL 41 08/23/2020   LDLDIRECT 186.1 03/24/2013   LDLCALC 83 08/23/2020   ALT 32 08/29/2020   AST 24 08/29/2020   NA 135 08/31/2020   K 3.5 08/31/2020   CL 105 08/31/2020   CREATININE 0.86 08/31/2020   BUN 18 08/31/2020   CO2 22 08/31/2020   TSH 1.542 08/23/2020   INR 1.1 08/29/2020   HGBA1C 5.4 08/30/2020    CT KNEE LEFT WO CONTRAST  Result Date: 10/06/2020 CLINICAL DATA:  Terri King Circle 1 month ago.  Left knee pain. EXAM: CT OF THE left KNEE WITHOUT CONTRAST TECHNIQUE: Multidetector CT imaging of the left knee was performed according to the standard protocol. Multiplanar CT image reconstructions were also generated. COMPARISON:  Radiographs 08/19/2020 FINDINGS: The femoral and tibial components are well  seated. I do not see any obvious complicating features such as loosening or periprosthetic fracture. On the axial and coronal images there is a small cortical fracture involving the medial femoral condyle in the epicondylar region anteriorly. This appears to be partially healed. The patella is intact. The quadriceps and patellar tendons are grossly intact. There is a moderate-sized suprapatellar knee joint  effusion with associated synovial thickening. IMPRESSION: 1. Small partially healed cortical fracture involving the medial femoral condyle in the epicondylar region anteriorly. 2. No complicating features associated with the total knee arthroplasty components. 3. Moderate-sized suprapatellar knee joint effusion with associated synovial thickening. Electronically Signed   By: Marijo Sanes M.D.   On: 10/06/2020 16:46    Assessment & Plan:   Problem List Items Addressed This Visit     Coronary atherosclerosis     F/u w/Dr Harrington Challenger On Pravastatin and Plavix, Norvasc On Repatha      Relevant Orders   TSH   Urinalysis   CBC with Differential/Platelet   Comprehensive metabolic panel   Lipase   Essential hypertension    Cont w/Norvasc, Toprol, Hydralazine      Relevant Orders   TSH   Urinalysis   CBC with Differential/Platelet   Comprehensive metabolic panel   Lipase   Fracture of femoral condyle, closed (HCC)    S/p rehab Use a walker      History of stroke    S/p  L occipital CVA On Plavix, d/c ASA due to nausea BP control      Relevant Orders   TSH   Urinalysis   CBC with Differential/Platelet   Comprehensive metabolic panel   Lipase   Hemoglobin A1c   Hyperglycemia    Check A1c      Relevant Orders   Hemoglobin A1c   Insomnia    Worse  Re-start Temazepam      Nausea & vomiting - Primary    New - ?etiology d/c ASA due to nausea Zofran prn Re-start Famotidine bid Check lipase, CMET, UA       Relevant Orders   TSH   Urinalysis   CBC with Differential/Platelet   Comprehensive metabolic panel   Lipase   Primary osteoarthritis involving multiple joints    Worse Tylenol prn pain      S/P TAVR (transcatheter aortic valve replacement)    No SOB        Walker Kehr, MD

## 2020-11-08 NOTE — Assessment & Plan Note (Signed)
Check A1c. 

## 2020-11-09 ENCOUNTER — Telehealth: Payer: Self-pay | Admitting: Internal Medicine

## 2020-11-09 DIAGNOSIS — E876 Hypokalemia: Secondary | ICD-10-CM | POA: Diagnosis not present

## 2020-11-09 DIAGNOSIS — N393 Stress incontinence (female) (male): Secondary | ICD-10-CM | POA: Diagnosis not present

## 2020-11-09 DIAGNOSIS — I350 Nonrheumatic aortic (valve) stenosis: Secondary | ICD-10-CM | POA: Diagnosis not present

## 2020-11-09 DIAGNOSIS — I2510 Atherosclerotic heart disease of native coronary artery without angina pectoris: Secondary | ICD-10-CM | POA: Diagnosis not present

## 2020-11-09 DIAGNOSIS — I252 Old myocardial infarction: Secondary | ICD-10-CM | POA: Diagnosis not present

## 2020-11-09 DIAGNOSIS — S72422D Displaced fracture of lateral condyle of left femur, subsequent encounter for closed fracture with routine healing: Secondary | ICD-10-CM | POA: Diagnosis not present

## 2020-11-09 DIAGNOSIS — F339 Major depressive disorder, recurrent, unspecified: Secondary | ICD-10-CM | POA: Diagnosis not present

## 2020-11-09 DIAGNOSIS — I10 Essential (primary) hypertension: Secondary | ICD-10-CM | POA: Diagnosis not present

## 2020-11-09 DIAGNOSIS — M1990 Unspecified osteoarthritis, unspecified site: Secondary | ICD-10-CM | POA: Diagnosis not present

## 2020-11-09 NOTE — Telephone Encounter (Signed)
Please take both meds at bedtime.  Thanks

## 2020-11-09 NOTE — Telephone Encounter (Signed)
Calling in to confirm QUEtiapine (SEROQUEL) 25 MG tablet was sent to pharmacy  Pharmacy:   [facility] ([facility]) - [place], [place] - [address]  Phone:  [phone] Fax:  [phone]

## 2020-11-09 NOTE — Telephone Encounter (Signed)
Duplicate msg.. See previous msg. Information has been called into [employer].Marland KitchenRaechel Chute

## 2020-11-09 NOTE — Telephone Encounter (Signed)
Called [employer] spoke w/ Huntley Dec Radiographer, therapeutic) gave her MD response. She states they did not need clarification. They received a reject from the insurance on the Temazepam, and needing clinical drug alter (PA) for the Temazepam. She gave me PA # [phone]. Called the PA dept answer clinical question over the phone. Reference # [id number].Marland KitchenRaechel Chute

## 2020-11-10 DIAGNOSIS — I2510 Atherosclerotic heart disease of native coronary artery without angina pectoris: Secondary | ICD-10-CM | POA: Diagnosis not present

## 2020-11-10 DIAGNOSIS — E876 Hypokalemia: Secondary | ICD-10-CM | POA: Diagnosis not present

## 2020-11-10 DIAGNOSIS — I350 Nonrheumatic aortic (valve) stenosis: Secondary | ICD-10-CM | POA: Diagnosis not present

## 2020-11-10 DIAGNOSIS — M1990 Unspecified osteoarthritis, unspecified site: Secondary | ICD-10-CM | POA: Diagnosis not present

## 2020-11-10 DIAGNOSIS — I252 Old myocardial infarction: Secondary | ICD-10-CM | POA: Diagnosis not present

## 2020-11-10 DIAGNOSIS — N393 Stress incontinence (female) (male): Secondary | ICD-10-CM | POA: Diagnosis not present

## 2020-11-10 DIAGNOSIS — S72422D Displaced fracture of lateral condyle of left femur, subsequent encounter for closed fracture with routine healing: Secondary | ICD-10-CM | POA: Diagnosis not present

## 2020-11-10 DIAGNOSIS — I10 Essential (primary) hypertension: Secondary | ICD-10-CM | POA: Diagnosis not present

## 2020-11-10 DIAGNOSIS — F339 Major depressive disorder, recurrent, unspecified: Secondary | ICD-10-CM | POA: Diagnosis not present

## 2020-11-10 NOTE — Telephone Encounter (Signed)
Rec'd determination letter med was approved. Authorization is good until 02/18/21...Raechel Chute

## 2020-11-11 ENCOUNTER — Telehealth: Payer: Self-pay

## 2020-11-11 ENCOUNTER — Ambulatory Visit: Payer: Medicare HMO | Admitting: *Deleted

## 2020-11-11 DIAGNOSIS — I10 Essential (primary) hypertension: Secondary | ICD-10-CM

## 2020-11-11 DIAGNOSIS — M8949 Other hypertrophic osteoarthropathy, multiple sites: Secondary | ICD-10-CM

## 2020-11-11 DIAGNOSIS — M159 Polyosteoarthritis, unspecified: Secondary | ICD-10-CM

## 2020-11-11 DIAGNOSIS — M150 Primary generalized (osteo)arthritis: Secondary | ICD-10-CM

## 2020-11-11 DIAGNOSIS — Z8673 Personal history of transient ischemic attack (TIA), and cerebral infarction without residual deficits: Secondary | ICD-10-CM

## 2020-11-11 DIAGNOSIS — S72411D Displaced unspecified condyle fracture of lower end of right femur, subsequent encounter for closed fracture with routine healing: Secondary | ICD-10-CM

## 2020-11-11 NOTE — Patient Instructions (Signed)
Visit Information  PATIENT GOALS:  Goals Addressed   None     The patient verbalized understanding of instructions, educational materials, and care plan provided today and declined offer to receive copy of patient instructions, educational materials, and care plan.   Telephone follow up appointment with care management team member scheduled for:12/05/20  Reece Levy MSW, LCSW Licensed Clinical Social Worker [facility] [place] [phone]

## 2020-11-11 NOTE — Telephone Encounter (Signed)
   Telephone encounter was:  Unsuccessful.  11/11/2020 Name: Terri King MRN: [id number] DOB: May 30, 1934  Unsuccessful outbound call made today to assist with:  [employer], Meals on Wheels, [employer] center, Adult Day Care, life alert and Designer, industrial/product. Left message on voicemail for patient's son to return my call regarding community resources needed.  Emailed information to [email or web address]. Placed  Letter saved in [email or web address] rferral to Meals on Wheels for patient to placed on wait list.  Outreach Attempt:  1st Attempt  A HIPAA compliant voice message was left requesting a return call.  Instructed patient to call back at [phone].  Elizzie Westergard, AAS Paralegal, [facility] Care Guide  Embedded Care Coordination [place]  Care Management  [address] [place], [place] [address] ??[email or web address]  ?? [phone]   [email or web address]

## 2020-11-11 NOTE — Telephone Encounter (Signed)
   Telephone encounter was:  Successful.  11/11/2020 Name: JOY REIGER MRN: [id number] DOB: 02/04/1935  [name] is a 86 y.o. year old female who is a primary care patient of [name], Georgina Quint, MD . The community resource team was consulted for assistance with Received message from [facility] referral Romeo Apple at [employer] [place] sent message that patient is currently enrolled in meals on wheels program.   Care guide performed the following interventions:  Sent email to patient's son Arma Reining to let him know his mother is already enrolled in meals on wheels program .  Follow Up Plan:  No further follow up planned at this time. The patient has been provided with needed resources.  Heaven Meeker, AAS Paralegal, [facility] Care Guide  Embedded Care Coordination [place]  Care Management  [address] [place], [place] [address] ??[email or web address]  ?? [phone]   [email or web address]

## 2020-11-11 NOTE — Chronic Care Management (AMB) (Addendum)
Chronic Care Management    Clinical Social Work Note  11/11/2020 Name: Terri King MRN: [id number] DOB: 30-Jun-1934  Terri King is a 86 y.o. year old female who is a primary care patient of [name], Evie Lacks, MD. The CCM team was consulted to assist the patient with chronic disease management and/or care coordination needs related to: Level of Care Concerns and Caregiver Stress.   Engaged with patient by telephone for follow up visit in response to provider referral for social work chronic care management and care coordination services.   Consent to Services:  The patient was given information about Chronic Care Management services, agreed to services, and gave verbal consent prior to initiation of services.  Please see initial visit note for detailed documentation.   Patient agreed to services and consent obtained.   Assessment: Review of patient past medical history, allergies, medications, and health status, including review of relevant consultants reports was performed today as part of a comprehensive evaluation and provision of chronic care management and care coordination services.     SDOH (Social Determinants of Health) assessments and interventions performed:    Advanced Directives Status: Not addressed in this encounter.  CCM Care Plan  Allergies  Allergen Reactions   Codeine Other (See Comments)    Abnormal behavior   Crestor [Rosuvastatin]     Myalgias    Dexilant [Dexlansoprazole]     Made me sick   Ibuprofen Nausea And Vomiting   Simvastatin     REACTION: achy   Tylenol [Acetaminophen]    Iodine Swelling and Rash    Outpatient Encounter Medications as of 11/11/2020  Medication Sig   acetaminophen (TYLENOL) 325 MG tablet Take 650 mg by mouth every 6 (six) hours as needed.   Alcohol Swabs (B-D SINGLE USE SWABS REGULAR) PADS 1 each by Does not apply route as directed. Dx: R73.9   amLODipine (NORVASC) 10 MG tablet Take 1 tablet (10 mg total) by mouth  daily.   amoxicillin (AMOXIL) 500 MG tablet Take FOUR TABLETS (2000 mg) by mouth ONE HOUR before any dental procedures (Patient not taking: Reported on 11/08/2020)   Blood Glucose Calibration (TRUE METRIX LEVEL 1) Low SOLN 1 each by In Vitro route as directed. Dx: R73.9   Blood Glucose Monitoring Suppl (TRUE METRIX METER) w/Device KIT USE AS DIRECTED   Cholecalciferol (VITAMIN D3) 50 MCG (2000 UT) capsule Take 1 capsule (2,000 Units total) by mouth daily.   clopidogrel (PLAVIX) 75 MG tablet Take 1 tablet (75 mg total) by mouth daily with breakfast.   escitalopram (LEXAPRO) 10 MG tablet Take 1 tablet (10 mg total) by mouth daily.   famotidine (PEPCID) 20 MG tablet Take 1 tablet (20 mg total) by mouth 2 (two) times daily.   glucose blood (TRUE METRIX BLOOD GLUCOSE TEST) test strip Use to check blood sugars twice a day   hydrALAZINE (APRESOLINE) 25 MG tablet Take 25 mg by mouth 3 (three) times daily.   Krill Oil (MAXIMUM RED KRILL) 300 MG CAPS 1 po qd   metoprolol succinate (TOPROL-XL) 50 MG 24 hr tablet TAKE 1 TABLET (50 MG TOTAL) BY MOUTH AT BEDTIME / WITH OR IMMEDIATELY FOLLOWING A MEAL AS DIRECTED   nitroGLYCERIN (NITROSTAT) 0.4 MG SL tablet Place 1 tablet (0.4 mg total) under the tongue every 5 (five) minutes as needed for chest pain.   ondansetron (ZOFRAN) 4 MG tablet Take 1 tablet (4 mg total) by mouth every 8 (eight) hours as needed for nausea or vomiting.  potassium chloride SA (KLOR-CON) 20 MEQ tablet Take 20 mEq by mouth daily.   QUEtiapine (SEROQUEL) 25 MG tablet Take 1 tablet (25 mg total) by mouth at bedtime.   REPATHA SURECLICK 694 MG/ML SOAJ INJECT 1 PEN INTO THE SKIN EVERY 14 DAYS   temazepam (RESTORIL) 15 MG capsule Take 1 capsule (15 mg total) by mouth at bedtime. (Patient not taking: Reported on 11/08/2020)   temazepam (RESTORIL) 30 MG capsule TAKE 1 CAPSULE AT BEDTIME AS NEEDED  FOR  SLEEP   TRUEplus Lancets 33G MISC Use to check blood sugars twice a day   valACYclovir (VALTREX)  1000 MG tablet Take 1 tablet (1,000 mg total) by mouth 3 (three) times daily.   vitamin B-12 (CYANOCOBALAMIN) 100 MCG tablet Take 100 mcg by mouth daily.   No facility-administered encounter medications on file as of 11/11/2020.    Patient Active Problem List   Diagnosis Date Noted   Nausea & vomiting 11/08/2020   Primary osteoarthritis involving multiple joints 10/27/2020   S/P TAVR (transcatheter aortic valve replacement)    History of stroke 08/23/2020   Syncope 08/19/2020   Fracture of femoral condyle, closed ([place]) 08/19/2020   Severe aortic stenosis    Statin myopathy 04/08/2019   Well adult exam 08/07/2018   Hand weakness 08/07/2018   Gait disorder 08/07/2018   Headache 08/13/2017   Rash and nonspecific skin eruption 08/13/2017   Boil of buttock 05/09/2017   Shoulder pain, left 08/14/2016   Aortic stenosis, moderate 08/14/2016   Osteoporosis 11/01/2015   Cough 09/02/2015   Heart valve disease 06/14/2015   Insomnia 10/22/2013   UTI (urinary tract infection) 08/24/2013   Preop exam for internal medicine 01/16/2012   Ankle pain 07/11/2011   Adjustment disorder with mixed anxiety and depressed mood 03/28/2011   GERD (gastroesophageal reflux disease) 12/25/2010   Dysphagia 12/25/2010   Coronary atherosclerosis 02/09/2010   FEVER UNSPECIFIED 01/18/2010   CHEST PAIN 01/18/2010   Urinary incontinence 01/18/2010   BRONCHITIS NOT SPECIFIED AS ACUTE OR CHRONIC 12/05/2009   NEURALGIA, TRIGEMINAL 07/04/2009   EAR PAIN 07/04/2009   HYPOKALEMIA 02/17/2009   Arthralgia 02/17/2009   Hyperglycemia 09/02/2008   Osteoarthritis 03/11/2008   EDEMA 03/11/2008   KNEE PAIN 02/04/2008   CHEST XRAY, ABNORMAL 07/28/2007   Hyperlipidemia 03/17/2007   Essential hypertension 03/17/2007   UPPER RESPIRATORY INFECTION (URI) 03/17/2007   VAGINITIS 03/17/2007    Conditions to be addressed/monitored:  s/p CVA  ; Level of care concerns, Limited access to caregiver, and Lacks knowledge of  community resource:    Care Plan : LCSW Plan of Care  Updates made by Deirdre Peer, LCSW since 11/11/2020 12:00 AM     Problem: Long-Term Care Planning   Priority: High     Long-Range Goal: Provide pt and family support and resources for long term care planning   Start Date: 11/03/2020  Expected End Date: 12/19/2020  This Visit's Progress: On track  Recent Progress: On track  Priority: High  Note:   Current barriers:    Limited social support, Transportation, Level of care concerns, ADL IADL limitations, and Lacks knowledge of community resource:   Clinical Goals: Patient will work with CSW to address needs related to long term care planning Clinical Interventions: CSW spoke with pt's son, [name], by phone who reports they were able to locate probable long term care insurance policy. CSW encouraged son to have his brother (local/near pt) to assist with getting contact info from pt's home (hopefully a packet, letter  with #) to call and inquire about the coverage benefits.   HHPT has begun and they anticipate [facility] PT twice weekly x 1 week and then once weekly for 3 weeks (per [place]) and then reassess for extending the services if needed.    Care Guide to follow up with them regarding resources; Meals on Wheels, transportation, community adult/senior activities/day care and life alert.  Assessment of needs, barriers , agencies contacted, as well as how impacting  Solution-Focused Strategies Active listening / Reflection utilized  Veterinary surgeon Caregiver stress acknowledged  Review various resources, discussed options and provided patient information about  Referral to care guide (ADvance Directives, )  1:1 collaboration with primary care provider regarding development and update of comprehensive plan of care as evidenced by provider attestation and co-signature Inter-disciplinary care team collaboration (see longitudinal plan of care) Patient Goals/Self-Care  Activities: Over the next 30 days -participate in [place] and other services as provided -review and consider completion of Advance Directives with Notary present to notarize -consider  getting a life alert type system in home - attend stroke recovery support group - check with a lawyer who knows elder law - check with a financial planner - complete a Power of Attorney; include caregiver - hold a family meeting with family members and loved ones - check out other places when staying at home is no longer possible (assisted living center, nursing home) - check out services like in-home help or adult day care        Follow Up Plan: Appointment scheduled for SW follow up with client by phone on: 12/05/20      Eduard Clos MSW, LCSW Licensed Clinical Social Worker [place] [phone]   Medical screening examination/treatment/procedure(s) were performed by non-physician practitioner and as supervising physician I was immediately available for consultation/collaboration.  I agree with above. Lew Dawes, MD

## 2020-11-11 NOTE — Telephone Encounter (Signed)
   Telephone encounter was:  Successful.  11/11/2020 Name: Terri King MRN: [id number] DOB: 03/06/1934  [name] is a 86 y.o. year old female who is a primary care patient of [name], Georgina Quint, MD . The community resource team was consulted for assistance with SCAT, Meals on Wheels, Senior Resource center, Adult Day Care, life alert and Designer, industrial/product.  Care guide performed the following interventions: Received email from patient's son [name] confirming receipt of emailed information.  He is forwarding email to his mother. [id number] referral placed for Meals on Wheels wait list.  Follow Up Plan:  Care guide will follow up with patient by phone over the next 7-10 days.      Aerie Donica, AAS Paralegal, [facility] Care Guide  Embedded Care Coordination [place]  [facility]  [address] [place], [place] [address] ??[email or web address]  ?? [phone]   [email or web address]

## 2020-11-16 DIAGNOSIS — F339 Major depressive disorder, recurrent, unspecified: Secondary | ICD-10-CM | POA: Diagnosis not present

## 2020-11-16 DIAGNOSIS — I252 Old myocardial infarction: Secondary | ICD-10-CM | POA: Diagnosis not present

## 2020-11-16 DIAGNOSIS — N393 Stress incontinence (female) (male): Secondary | ICD-10-CM | POA: Diagnosis not present

## 2020-11-16 DIAGNOSIS — E876 Hypokalemia: Secondary | ICD-10-CM | POA: Diagnosis not present

## 2020-11-16 DIAGNOSIS — S72422D Displaced fracture of lateral condyle of left femur, subsequent encounter for closed fracture with routine healing: Secondary | ICD-10-CM | POA: Diagnosis not present

## 2020-11-16 DIAGNOSIS — I2510 Atherosclerotic heart disease of native coronary artery without angina pectoris: Secondary | ICD-10-CM | POA: Diagnosis not present

## 2020-11-16 DIAGNOSIS — I350 Nonrheumatic aortic (valve) stenosis: Secondary | ICD-10-CM | POA: Diagnosis not present

## 2020-11-16 DIAGNOSIS — M1990 Unspecified osteoarthritis, unspecified site: Secondary | ICD-10-CM | POA: Diagnosis not present

## 2020-11-16 DIAGNOSIS — I10 Essential (primary) hypertension: Secondary | ICD-10-CM | POA: Diagnosis not present

## 2020-11-18 DIAGNOSIS — F339 Major depressive disorder, recurrent, unspecified: Secondary | ICD-10-CM | POA: Diagnosis not present

## 2020-11-18 DIAGNOSIS — I2510 Atherosclerotic heart disease of native coronary artery without angina pectoris: Secondary | ICD-10-CM | POA: Diagnosis not present

## 2020-11-18 DIAGNOSIS — N393 Stress incontinence (female) (male): Secondary | ICD-10-CM | POA: Diagnosis not present

## 2020-11-18 DIAGNOSIS — I350 Nonrheumatic aortic (valve) stenosis: Secondary | ICD-10-CM | POA: Diagnosis not present

## 2020-11-18 DIAGNOSIS — M1990 Unspecified osteoarthritis, unspecified site: Secondary | ICD-10-CM | POA: Diagnosis not present

## 2020-11-18 DIAGNOSIS — E876 Hypokalemia: Secondary | ICD-10-CM | POA: Diagnosis not present

## 2020-11-18 DIAGNOSIS — I252 Old myocardial infarction: Secondary | ICD-10-CM | POA: Diagnosis not present

## 2020-11-18 DIAGNOSIS — I10 Essential (primary) hypertension: Secondary | ICD-10-CM | POA: Diagnosis not present

## 2020-11-18 DIAGNOSIS — S72422D Displaced fracture of lateral condyle of left femur, subsequent encounter for closed fracture with routine healing: Secondary | ICD-10-CM | POA: Diagnosis not present

## 2020-11-21 DIAGNOSIS — N393 Stress incontinence (female) (male): Secondary | ICD-10-CM | POA: Diagnosis not present

## 2020-11-21 DIAGNOSIS — E876 Hypokalemia: Secondary | ICD-10-CM | POA: Diagnosis not present

## 2020-11-21 DIAGNOSIS — I10 Essential (primary) hypertension: Secondary | ICD-10-CM | POA: Diagnosis not present

## 2020-11-21 DIAGNOSIS — S72422D Displaced fracture of lateral condyle of left femur, subsequent encounter for closed fracture with routine healing: Secondary | ICD-10-CM | POA: Diagnosis not present

## 2020-11-21 DIAGNOSIS — F339 Major depressive disorder, recurrent, unspecified: Secondary | ICD-10-CM | POA: Diagnosis not present

## 2020-11-21 DIAGNOSIS — I2510 Atherosclerotic heart disease of native coronary artery without angina pectoris: Secondary | ICD-10-CM | POA: Diagnosis not present

## 2020-11-21 DIAGNOSIS — I252 Old myocardial infarction: Secondary | ICD-10-CM | POA: Diagnosis not present

## 2020-11-21 DIAGNOSIS — M1990 Unspecified osteoarthritis, unspecified site: Secondary | ICD-10-CM | POA: Diagnosis not present

## 2020-11-21 DIAGNOSIS — I350 Nonrheumatic aortic (valve) stenosis: Secondary | ICD-10-CM | POA: Diagnosis not present

## 2020-11-23 DIAGNOSIS — I350 Nonrheumatic aortic (valve) stenosis: Secondary | ICD-10-CM | POA: Diagnosis not present

## 2020-11-23 DIAGNOSIS — S72422D Displaced fracture of lateral condyle of left femur, subsequent encounter for closed fracture with routine healing: Secondary | ICD-10-CM | POA: Diagnosis not present

## 2020-11-23 DIAGNOSIS — F339 Major depressive disorder, recurrent, unspecified: Secondary | ICD-10-CM | POA: Diagnosis not present

## 2020-11-23 DIAGNOSIS — E876 Hypokalemia: Secondary | ICD-10-CM | POA: Diagnosis not present

## 2020-11-23 DIAGNOSIS — M1990 Unspecified osteoarthritis, unspecified site: Secondary | ICD-10-CM | POA: Diagnosis not present

## 2020-11-23 DIAGNOSIS — I10 Essential (primary) hypertension: Secondary | ICD-10-CM | POA: Diagnosis not present

## 2020-11-23 DIAGNOSIS — N393 Stress incontinence (female) (male): Secondary | ICD-10-CM | POA: Diagnosis not present

## 2020-11-23 DIAGNOSIS — I252 Old myocardial infarction: Secondary | ICD-10-CM | POA: Diagnosis not present

## 2020-11-23 DIAGNOSIS — I2510 Atherosclerotic heart disease of native coronary artery without angina pectoris: Secondary | ICD-10-CM | POA: Diagnosis not present

## 2020-11-30 ENCOUNTER — Encounter: Payer: Self-pay | Admitting: Internal Medicine

## 2020-11-30 ENCOUNTER — Ambulatory Visit (INDEPENDENT_AMBULATORY_CARE_PROVIDER_SITE_OTHER): Payer: Medicare HMO | Admitting: Internal Medicine

## 2020-11-30 ENCOUNTER — Telehealth: Payer: Self-pay | Admitting: Internal Medicine

## 2020-11-30 VITALS — BP 162/80 | HR 98 | Temp 98.7°F

## 2020-11-30 DIAGNOSIS — R1011 Right upper quadrant pain: Secondary | ICD-10-CM

## 2020-11-30 DIAGNOSIS — R112 Nausea with vomiting, unspecified: Secondary | ICD-10-CM

## 2020-11-30 DIAGNOSIS — R1032 Left lower quadrant pain: Secondary | ICD-10-CM | POA: Insufficient documentation

## 2020-11-30 DIAGNOSIS — I10 Essential (primary) hypertension: Secondary | ICD-10-CM | POA: Diagnosis not present

## 2020-11-30 MED ORDER — AMOXICILLIN-POT CLAVULANATE 875-125 MG PO TABS: 1.0000 | ORAL_TABLET | Freq: Two times a day (BID) | ORAL | 0 refills | Status: DC

## 2020-11-30 MED ORDER — HYDROCODONE-ACETAMINOPHEN 5-325 MG PO TABS: 0.5000 | ORAL_TABLET | Freq: Four times a day (QID) | ORAL | 0 refills | Status: DC | PRN

## 2020-11-30 MED ORDER — ONDANSETRON HCL 4 MG/2ML IJ SOLN
4.0000 mg | INTRAMUSCULAR | Status: AC
  Administered 2020-11-30: 4 mg via INTRAMUSCULAR

## 2020-11-30 MED ORDER — ONDANSETRON HCL 4 MG PO TABS: 4.0000 mg | ORAL_TABLET | Freq: Three times a day (TID) | ORAL | 1 refills | Status: DC | PRN

## 2020-11-30 MED ORDER — KETOROLAC TROMETHAMINE 60 MG/2ML IM SOLN
60.0000 mg | Freq: Once | INTRAMUSCULAR | Status: AC
  Administered 2020-11-30: 60 mg via INTRAMUSCULAR

## 2020-11-30 NOTE — Telephone Encounter (Signed)
HH ORDERS   Caller Name: [facility] Agency Name: [facility] Phone #: [phone] Service Requested: Social Work eval (examples: OT/PT/Skilled Nursing/Social Work/Speech Therapy/Wound Care) Frequency of Visits: n/a   Pt also has sx of UTI. Stated the health manger will probably contact PCP for verbals for skilled nursing.

## 2020-11-30 NOTE — Assessment & Plan Note (Addendum)
Recurrent abdominal pain currently located in the right upper quadrant and left lower quadrant.  Differential diagnosis is broad including gallstones, kidney stones, diverticulitis, pancreatitis and other. Obtain lab work including lipase, liver tests, renal function, CBC, sed rate, urine test. Hospital records, CT scan etc. Reviewed The patient was given Zofran 4 mg and Toradol 30 mg IM Augmentin prescription sent to the pharmacy along with oral Zofran and hydrocodone to use as needed.  The patient and her friend Terri King were instructed to proceed to ER if worse or not better.  Clear liquids today.

## 2020-11-30 NOTE — Progress Notes (Signed)
Subjective:  Patient ID: Terri King, female    DOB: 1934/05/26  Age: 86 y.o. MRN: [id number]  CC: Emesis (Nausea) and GI Problem (C/o stomach pain)  HPI   Originally we had a telephone visit with Terri King.  However, she expressed a wish to be seen today in the office.  She was worked in was seen face-to-face by myself.  Terri King is here with her friend Terri King.  Terri King is a difficult historian. That is helping with history.  The patient is complaining off nausea and vomiting today.  Apparently she threw up 3 times.  Mostly it was bile.  She is complaining of abdominal pain.  She was unable to keep anything down earlier today except for some sips of water.  No fever.  Her son Terri King and her daughter Terri King joined [place] via speaker phone.[name] was in the hospital for CVA and femur fracture this summer, then she spent a month in [place].  CT angiogram revealed gallstones and diverticulosis Aug 2022.  She was having problems with UTIs in [place].  There has been episodic nausea vomiting and abdominal pain since discharge.  Apparently she is taking Pepcid twice a day.  No new meds.  No fever  Outpatient Medications Prior to Visit  Medication Sig Dispense Refill   acetaminophen (TYLENOL) 325 MG tablet Take 650 mg by mouth every 6 (six) hours as needed.     Alcohol Swabs (B-D SINGLE USE SWABS REGULAR) PADS 1 each by Does not apply route as directed. Dx: R73.9 180 each 3   amLODipine (NORVASC) 10 MG tablet Take 1 tablet (10 mg total) by mouth daily. 90 tablet 1   amoxicillin (AMOXIL) 500 MG tablet Take FOUR TABLETS (2000 mg) by mouth ONE HOUR before any dental procedures 12 tablet 2   Blood Glucose Calibration (TRUE METRIX LEVEL 1) Low SOLN 1 each by In Vitro route as directed. Dx: R73.9 3 each 3   Blood Glucose Monitoring Suppl (TRUE METRIX METER) w/Device KIT USE AS DIRECTED 1 kit 0   Cholecalciferol (VITAMIN D3) 50 MCG (2000 UT) capsule Take 1 capsule (2,000 Units total) by mouth daily. 100 capsule 3    clopidogrel (PLAVIX) 75 MG tablet Take 1 tablet (75 mg total) by mouth daily with breakfast. 30 tablet 5   escitalopram (LEXAPRO) 10 MG tablet Take 1 tablet (10 mg total) by mouth daily. 90 tablet 3   famotidine (PEPCID) 20 MG tablet Take 1 tablet (20 mg total) by mouth 2 (two) times daily. 180 tablet 3   glucose blood (TRUE METRIX BLOOD GLUCOSE TEST) test strip Use to check blood sugars twice a day 180 each 3   hydrALAZINE (APRESOLINE) 25 MG tablet Take 25 mg by mouth 3 (three) times daily.     Krill Oil (MAXIMUM RED KRILL) 300 MG CAPS 1 po qd 100 capsule 3   metoprolol succinate (TOPROL-XL) 50 MG 24 hr tablet TAKE 1 TABLET (50 MG TOTAL) BY MOUTH AT BEDTIME / WITH OR IMMEDIATELY FOLLOWING A MEAL AS DIRECTED 90 tablet 3   nitroGLYCERIN (NITROSTAT) 0.4 MG SL tablet Place 1 tablet (0.4 mg total) under the tongue every 5 (five) minutes as needed for chest pain. 25 tablet 6   potassium chloride SA (KLOR-CON) 20 MEQ tablet Take 20 mEq by mouth daily.     QUEtiapine (SEROQUEL) 25 MG tablet Take 1 tablet (25 mg total) by mouth at bedtime.     REPATHA SURECLICK 268 MG/ML SOAJ INJECT 1 PEN INTO THE SKIN EVERY 14  DAYS 6 mL 3   temazepam (RESTORIL) 30 MG capsule TAKE 1 CAPSULE AT BEDTIME AS NEEDED  FOR  SLEEP 90 capsule 1   TRUEplus Lancets 33G MISC Use to check blood sugars twice a day 180 each 3   vitamin B-12 (CYANOCOBALAMIN) 100 MCG tablet Take 100 mcg by mouth daily.     ondansetron (ZOFRAN) 4 MG tablet Take 1 tablet (4 mg total) by mouth every 8 (eight) hours as needed for nausea or vomiting. 21 tablet 1   temazepam (RESTORIL) 15 MG capsule Take 1 capsule (15 mg total) by mouth at bedtime. 5 capsule 0   valACYclovir (VALTREX) 1000 MG tablet Take 1 tablet (1,000 mg total) by mouth 3 (three) times daily. 21 tablet 0   No facility-administered medications prior to visit.    ROS: Review of Systems  Constitutional:  Positive for fatigue. Negative for activity change, appetite change, chills, fever  and unexpected weight change.  HENT:  Negative for congestion, mouth sores and sinus pressure.   Eyes:  Negative for visual disturbance.  Respiratory:  Negative for cough and chest tightness.   Gastrointestinal:  Positive for abdominal pain, nausea and vomiting. Negative for abdominal distention, blood in stool, diarrhea and rectal pain.  Genitourinary:  Negative for difficulty urinating, flank pain, frequency, urgency and vaginal pain.  Musculoskeletal:  Positive for arthralgias, back pain and gait problem.  Skin:  Negative for color change, pallor and rash.  Neurological:  Positive for weakness. Negative for dizziness, tremors, numbness and headaches.  Psychiatric/Behavioral:  Positive for decreased concentration. Negative for confusion and sleep disturbance.    Objective:  BP (!) 162/80 (BP Location: Left Arm)   Pulse 98   Temp 98.7 F (37.1 C) (Oral)   SpO2 98%   BP Readings from Last 3 Encounters:  11/30/20 (!) 162/80  11/08/20 (!) 152/68  10/27/20 (!) 148/76    Wt Readings from Last 3 Encounters:  11/08/20 151 lb 9.6 oz (68.8 kg)  10/27/20 158 lb (71.7 kg)  09/29/20 155 lb (70.3 kg)    Physical Exam Constitutional:      General: She is not in acute distress.    Appearance: She is well-developed. She is obese. She is not ill-appearing or toxic-appearing.  HENT:     Head: Normocephalic.     Right Ear: External ear normal.     Left Ear: External ear normal.     Nose: Nose normal.  Eyes:     General:        Right eye: No discharge.        Left eye: No discharge.     Conjunctiva/sclera: Conjunctivae normal.     Pupils: Pupils are equal, round, and reactive to light.  Neck:     Thyroid: No thyromegaly.     Vascular: No JVD.     Trachea: No tracheal deviation.  Cardiovascular:     Rate and Rhythm: Normal rate and regular rhythm.     Heart sounds: Normal heart sounds.  Pulmonary:     Effort: No respiratory distress.     Breath sounds: No stridor. No wheezing.   Abdominal:     General: Bowel sounds are normal.     Palpations: Abdomen is soft. There is no mass.     Tenderness: There is abdominal tenderness. There is no guarding or rebound.  Musculoskeletal:        General: Tenderness present.     Cervical back: Normal range of motion and neck supple. No rigidity.  Lymphadenopathy:     Cervical: No cervical adenopathy.  Skin:    Findings: No erythema or rash.  Neurological:     Cranial Nerves: No cranial nerve deficit.     Motor: No abnormal muscle tone.     Coordination: Coordination abnormal.     Gait: Gait abnormal.     Deep Tendon Reflexes: Reflexes normal.  Psychiatric:        Behavior: Behavior normal.        Thought Content: Thought content normal.        Judgment: Judgment normal.   No jaundice Using a walker.  Antalgic gait.  Lumbar spine with pain on range of motion Abdomen is nondistended, tender in the right upper quadrant and in the left lower quadrant.  No hernia or mass.  No rebound symptoms  Lab Results  Component Value Date   WBC 6.5 11/08/2020   HGB 12.6 11/08/2020   HCT 37.0 11/08/2020   PLT 250.0 11/08/2020   GLUCOSE 108 (H) 11/08/2020   CHOL 153 08/23/2020   TRIG 144 08/23/2020   HDL 41 08/23/2020   LDLDIRECT 186.1 03/24/2013   LDLCALC 83 08/23/2020   ALT 14 11/08/2020   AST 25 11/08/2020   NA 142 11/08/2020   K 3.1 (L) 11/08/2020   CL 108 11/08/2020   CREATININE 0.76 11/08/2020   BUN 8 11/08/2020   CO2 26 11/08/2020   TSH 0.89 11/08/2020   INR 1.1 08/29/2020   HGBA1C 5.3 11/08/2020    CT KNEE LEFT WO CONTRAST  Result Date: 10/06/2020 CLINICAL DATA:  [place] 1 month ago.  Left knee pain. EXAM: CT OF THE left KNEE WITHOUT CONTRAST TECHNIQUE: Multidetector CT imaging of the left knee was performed according to the standard protocol. Multiplanar CT image reconstructions were also generated. COMPARISON:  Radiographs 08/19/2020 FINDINGS: The femoral and tibial components are well seated. I do not see any  obvious complicating features such as loosening or periprosthetic fracture. On the axial and coronal images there is a small cortical fracture involving the medial femoral condyle in the epicondylar region anteriorly. This appears to be partially healed. The patella is intact. The quadriceps and patellar tendons are grossly intact. There is a moderate-sized suprapatellar knee joint effusion with associated synovial thickening. IMPRESSION: 1. Small partially healed cortical fracture involving the medial femoral condyle in the epicondylar region anteriorly. 2. No complicating features associated with the total knee arthroplasty components. 3. Moderate-sized suprapatellar knee joint effusion with associated synovial thickening. Electronically Signed   By: Marijo Sanes M.D.   On: 10/06/2020 16:46    Assessment & Plan:   Problem List Items Addressed This Visit     Essential hypertension    Hold blood pressure meds tonight and tomorrow      LLQ abdominal pain    Recurrent abdominal pain currently located in the right upper quadrant and left lower quadrant.  Differential diagnosis is broad including gallstones, kidney stones, diverticulitis, pancreatitis and other. Obtain lab work including lipase, liver tests, renal function, CBC, sed rate, urine test. Hospital records, CT scan etc. Reviewed The patient was given Zofran 4 mg and Toradol 30 mg IM Augmentin prescription sent to the pharmacy along with oral Zofran and hydrocodone to use as needed.  The patient and her friend Marliss Coots were instructed to proceed to ER if worse or not better.  Clear liquids today.      Relevant Orders   Comprehensive metabolic panel   CBC with Differential/Platelet   Lipase   Urinalysis  Sedimentation rate   Nausea & vomiting - Primary    Recurrent nausea/vomiting and abdominal pain currently located in the right upper quadrant and left lower quadrant.  Differential diagnosis is broad including gallstones, kidney  stones, diverticulitis, pancreatitis and other. Obtain lab work including lipase, liver tests, renal function, CBC, sed rate, urine test. Hospital records, CT scan etc. Reviewed The patient was given Zofran 4 mg and Toradol 30 mg IM Augmentin prescription sent to the pharmacy along with oral Zofran and hydrocodone to use as needed.  The patient and her friend Marliss Coots were instructed to proceed to ER if worse or not better.  Clear liquids today. It is a complicated situation.      RUQ abdominal pain   Relevant Orders   Comprehensive metabolic panel   CBC with Differential/Platelet   Lipase   Urinalysis   Sedimentation rate      Follow-up: Return in about 1 week (around 12/07/2020) for a follow-up visit.  Walker Kehr, MD

## 2020-11-30 NOTE — Assessment & Plan Note (Signed)
Recurrent nausea/vomiting and abdominal pain currently located in the right upper quadrant and left lower quadrant.  Differential diagnosis is broad including gallstones, kidney stones, diverticulitis, pancreatitis and other. Obtain lab work including lipase, liver tests, renal function, CBC, sed rate, urine test. Hospital records, CT scan etc. Reviewed The patient was given Zofran 4 mg and Toradol 30 mg IM Augmentin prescription sent to the pharmacy along with oral Zofran and hydrocodone to use as needed.  The patient and her friend Massie Bougie were instructed to proceed to ER if worse or not better.  Clear liquids today. It is a complicated situation.

## 2020-11-30 NOTE — Assessment & Plan Note (Signed)
Hold blood pressure meds tonight and tomorrow

## 2020-11-30 NOTE — Patient Instructions (Signed)
Go to ER if worse 

## 2020-12-01 ENCOUNTER — Other Ambulatory Visit: Payer: Self-pay

## 2020-12-01 ENCOUNTER — Other Ambulatory Visit (INDEPENDENT_AMBULATORY_CARE_PROVIDER_SITE_OTHER): Payer: Medicare HMO

## 2020-12-01 DIAGNOSIS — R1011 Right upper quadrant pain: Secondary | ICD-10-CM

## 2020-12-01 DIAGNOSIS — R1032 Left lower quadrant pain: Secondary | ICD-10-CM

## 2020-12-01 LAB — URINALYSIS, ROUTINE W REFLEX MICROSCOPIC
Hgb urine dipstick: NEGATIVE
Ketones, ur: 15 — AB
Leukocytes,Ua: NEGATIVE
Nitrite: POSITIVE — AB
Specific Gravity, Urine: >=1.03 — AB (ref 1.000–1.030)
Total Protein, Urine: 30 — AB
Urine Glucose: NEGATIVE
Urobilinogen, UA: 1 (ref 0.0–1.0)
pH: 5.5 (ref 5.0–8.0)

## 2020-12-01 LAB — COMPREHENSIVE METABOLIC PANEL
ALT: 18 U/L (ref 0–35)
AST: 32 U/L (ref 0–37)
Albumin: 3.9 g/dL (ref 3.5–5.2)
Alkaline Phosphatase: 103 U/L (ref 39–117)
BUN: 11 mg/dL (ref 6–23)
CO2: 25 mEq/L (ref 19–32)
Calcium: 10 mg/dL (ref 8.4–10.5)
Chloride: 104 mEq/L (ref 96–112)
Creatinine, Ser: 1.11 mg/dL (ref 0.40–1.20)
GFR: 45.57 mL/min — ABNORMAL LOW (ref >60.00)
Glucose, Bld: 128 mg/dL — ABNORMAL HIGH (ref 70–99)
Potassium: 3.3 mEq/L — ABNORMAL LOW (ref 3.5–5.1)
Sodium: 138 mEq/L (ref 135–145)
Total Bilirubin: 1.4 mg/dL — ABNORMAL HIGH (ref 0.2–1.2)
Total Protein: 6.6 g/dL (ref 6.0–8.3)

## 2020-12-01 LAB — CBC WITH DIFFERENTIAL/PLATELET
Basophils Absolute: 0 10*3/uL (ref 0.0–0.1)
Basophils Relative: 0.4 % (ref 0.0–3.0)
Eosinophils Absolute: 0.1 10*3/uL (ref 0.0–0.7)
Eosinophils Relative: 1.3 % (ref 0.0–5.0)
HCT: 38.6 % (ref 36.0–46.0)
Hemoglobin: 13.4 g/dL (ref 12.0–15.0)
Lymphocytes Relative: 15.7 % (ref 12.0–46.0)
Lymphs Abs: 1.2 10*3/uL (ref 0.7–4.0)
MCHC: 34.8 g/dL (ref 30.0–36.0)
MCV: 97.4 fl (ref 78.0–100.0)
Monocytes Absolute: 0.8 10*3/uL (ref 0.1–1.0)
Monocytes Relative: 11 % (ref 3.0–12.0)
Neutro Abs: 5.5 10*3/uL (ref 1.4–7.7)
Neutrophils Relative %: 71.6 % (ref 43.0–77.0)
Platelets: 257 10*3/uL (ref 150.0–400.0)
RBC: 3.96 Mil/uL (ref 3.87–5.11)
RDW: 14.2 % (ref 11.5–15.5)
WBC: 7.6 10*3/uL (ref 4.0–10.5)

## 2020-12-01 LAB — SEDIMENTATION RATE: Sed Rate: 14 mm/hr (ref 0–30)

## 2020-12-01 LAB — LIPASE: Lipase: 13 U/L (ref 11.0–59.0)

## 2020-12-02 MED ORDER — POTASSIUM CHLORIDE CRYS ER 20 MEQ PO TBCR: 20.0000 meq | EXTENDED_RELEASE_TABLET | Freq: Every day | ORAL | 3 refills | Status: AC

## 2020-12-02 NOTE — Telephone Encounter (Signed)
Okay.  Thanks.

## 2020-12-02 NOTE — Telephone Encounter (Signed)
Called [place] there was no answer LMOM w/MD response.Marland KitchenRaechel Chute

## 2020-12-05 ENCOUNTER — Other Ambulatory Visit: Payer: Self-pay | Admitting: *Deleted

## 2020-12-05 ENCOUNTER — Telehealth: Payer: Medicare HMO

## 2020-12-05 MED ORDER — ONDANSETRON HCL 4 MG PO TABS: 4.0000 mg | ORAL_TABLET | Freq: Three times a day (TID) | ORAL | 0 refills | Status: AC | PRN

## 2020-12-06 ENCOUNTER — Telehealth: Payer: Self-pay | Admitting: *Deleted

## 2020-12-06 NOTE — Telephone Encounter (Signed)
  Care Management   Follow Up Note   12/06/2020 Name: SAMENTHA PERHAM MRN: [id number] DOB: May 13, 1934   Referred by: Tresa Garter, MD Reason for referral : No chief complaint on file.   An unsuccessful telephone outreach was attempted today. The patient was referred to the case management team for assistance with care management and care coordination.   Follow Up Plan: Telephone follow up appointment with care management team member scheduled for:01/09/21  Reece Levy MSW, LCSW Licensed Clinical Social Worker [facility] [place] [phone]

## 2020-12-07 ENCOUNTER — Encounter: Payer: Self-pay | Admitting: Internal Medicine

## 2020-12-07 NOTE — Telephone Encounter (Signed)
No note needed 

## 2020-12-08 ENCOUNTER — Telehealth: Payer: Self-pay | Admitting: Internal Medicine

## 2020-12-08 NOTE — Telephone Encounter (Signed)
Patients' nurse Charlynne Pander calling requesting advise on rx  escitalopram (LEXAPRO) 10 MG tablet  metoprolol succinate (TOPROL-XL) 50 MG 24 hr tablet  Nurse wants to be informed if patient should continue taking medications  [phone]

## 2020-12-09 ENCOUNTER — Telehealth: Payer: Medicare HMO

## 2020-12-09 ENCOUNTER — Telehealth: Payer: Self-pay | Admitting: *Deleted

## 2020-12-09 MED ORDER — METOPROLOL SUCCINATE ER 50 MG PO TB24: ORAL_TABLET | ORAL | 3 refills | Status: DC

## 2020-12-09 MED ORDER — TRUE METRIX BLOOD GLUCOSE TEST VI STRP
ORAL_STRIP | 3 refills | Status: DC
Start: 2020-12-09 — End: 2020-12-29

## 2020-12-09 MED ORDER — FAMOTIDINE 20 MG PO TABS
20.0000 mg | ORAL_TABLET | Freq: Two times a day (BID) | ORAL | 0 refills | Status: DC
Start: 2020-12-09 — End: 2020-12-09

## 2020-12-09 MED ORDER — FAMOTIDINE 20 MG PO TABS: 20.0000 mg | ORAL_TABLET | Freq: Two times a day (BID) | ORAL | 3 refills | Status: AC

## 2020-12-09 MED ORDER — ESCITALOPRAM OXALATE 10 MG PO TABS: 10.0000 mg | ORAL_TABLET | Freq: Every day | ORAL | 0 refills | Status: DC

## 2020-12-09 MED ORDER — TRUEPLUS LANCETS 33G MISC: 3 refills | Status: DC

## 2020-12-09 MED ORDER — METOPROLOL SUCCINATE ER 50 MG PO TB24: ORAL_TABLET | ORAL | 0 refills | Status: DC

## 2020-12-09 MED ORDER — ESCITALOPRAM OXALATE 10 MG PO TABS: 10.0000 mg | ORAL_TABLET | Freq: Every day | ORAL | 3 refills | Status: DC

## 2020-12-09 NOTE — Telephone Encounter (Signed)
Notified Charlynne Pander w/ MD response. Charlynne Pander states pt does not have any medicine bottles in the home. She is not taking anything . But her pain medication and potassium. Inform Cara per chart pt should have meds.Marland Kitchen rx's was sent to [facility]. Charlynne Pander is requesting a 30 day script be sent o her local pharmacy, and then send new scripts to mail service. She would also like med list fax to her [phone]. She states their will be a IT sales professional going out to help as well .Marland KitchenRaechel Chute

## 2020-12-09 NOTE — Telephone Encounter (Signed)
  Care Management   Follow Up Note   12/09/2020 Name: TAILOR LUCKING MRN: [id number] DOB: 1934-03-26   Referred by: Tresa Garter, MD Reason for referral : No chief complaint on file.   A second unsuccessful telephone outreach was attempted today. The patient was referred to the case management team for assistance with care management and care coordination.   Follow Up Plan: Telephone follow up appointment with care management team member scheduled for: 7-10 days  Reece Levy MSW, LCSW Licensed Clinical Social Worker [facility] [phone]

## 2020-12-09 NOTE — Telephone Encounter (Signed)
Yes.  Continue both please. thanks

## 2020-12-11 ENCOUNTER — Other Ambulatory Visit: Payer: Self-pay | Admitting: Internal Medicine

## 2020-12-12 ENCOUNTER — Encounter: Payer: Self-pay | Admitting: Physician Assistant

## 2020-12-15 ENCOUNTER — Ambulatory Visit (INDEPENDENT_AMBULATORY_CARE_PROVIDER_SITE_OTHER): Payer: Medicare HMO | Admitting: *Deleted

## 2020-12-15 DIAGNOSIS — I2510 Atherosclerotic heart disease of native coronary artery without angina pectoris: Secondary | ICD-10-CM | POA: Diagnosis not present

## 2020-12-15 DIAGNOSIS — M159 Polyosteoarthritis, unspecified: Secondary | ICD-10-CM

## 2020-12-15 DIAGNOSIS — Z8673 Personal history of transient ischemic attack (TIA), and cerebral infarction without residual deficits: Secondary | ICD-10-CM

## 2020-12-15 DIAGNOSIS — S72411D Displaced unspecified condyle fracture of lower end of right femur, subsequent encounter for closed fracture with routine healing: Secondary | ICD-10-CM

## 2020-12-15 NOTE — Chronic Care Management (AMB) (Signed)
Chronic Care Management    Clinical Social Work Note  12/15/2020 Name: Terri King MRN: [id number] DOB: 05-13-1934  Terri King is a 86 y.o. year old female who is a primary care patient of [name], Evie Lacks, MD. The CCM team was consulted to assist the patient with chronic disease management and/or care coordination needs related to: [employer] .   Engaged with patient by telephone for follow up visit in response to provider referral for social work chronic care management and care coordination services.   Consent to Services:  The patient was given information about Chronic Care Management services, agreed to services, and gave verbal consent prior to initiation of services.  Please see initial visit note for detailed documentation.   Patient agreed to services and consent obtained.   Assessment: Review of patient past medical history, allergies, medications, and health status, including review of relevant consultants reports was performed today as part of a comprehensive evaluation and provision of chronic care management and care coordination services.     SDOH (Social Determinants of Health) assessments and interventions performed:    Advanced Directives Status: See Care Plan for related entries.  CCM Care Plan  Allergies  Allergen Reactions   Codeine Other (See Comments)    Abnormal behavior   Crestor [Rosuvastatin]     Myalgias    Dexilant [Dexlansoprazole]     Made me sick   Ibuprofen Nausea And Vomiting   Simvastatin     REACTION: achy   Tylenol [Acetaminophen]    Iodine Swelling and Rash    Outpatient Encounter Medications as of 12/15/2020  Medication Sig   acetaminophen (TYLENOL) 325 MG tablet Take 650 mg by mouth every 6 (six) hours as needed.   Alcohol Swabs (B-D SINGLE USE SWABS REGULAR) PADS 1 each by Does not apply route as directed. Dx: R73.9   amLODipine (NORVASC) 10 MG tablet Take 1 tablet (10 mg total) by mouth daily.   amoxicillin  (AMOXIL) 500 MG tablet Take FOUR TABLETS (2000 mg) by mouth ONE HOUR before any dental procedures   amoxicillin-clavulanate (AUGMENTIN) 875-125 MG tablet Take 1 tablet by mouth 2 (two) times daily.   Blood Glucose Calibration (TRUE METRIX LEVEL 1) Low SOLN 1 each by In Vitro route as directed. Dx: R73.9   Blood Glucose Monitoring Suppl (TRUE METRIX METER) w/Device KIT USE AS DIRECTED   Cholecalciferol (VITAMIN D3) 50 MCG (2000 UT) capsule Take 1 capsule (2,000 Units total) by mouth daily.   clopidogrel (PLAVIX) 75 MG tablet Take 1 tablet (75 mg total) by mouth daily with breakfast.   escitalopram (LEXAPRO) 10 MG tablet TAKE 1 TABLET(10 MG) BY MOUTH DAILY   famotidine (PEPCID) 20 MG tablet Take 1 tablet (20 mg total) by mouth 2 (two) times daily.   glucose blood (TRUE METRIX BLOOD GLUCOSE TEST) test strip Use to check blood sugars twice a day   hydrALAZINE (APRESOLINE) 25 MG tablet Take 25 mg by mouth 3 (three) times daily.   HYDROcodone-acetaminophen (NORCO) 5-325 MG tablet Take 0.5-1 tablets by mouth every 6 (six) hours as needed for severe pain.   Krill Oil (MAXIMUM RED KRILL) 300 MG CAPS 1 po qd   metoprolol succinate (TOPROL-XL) 50 MG 24 hr tablet TAKE 1 TABLET(50 MG) BY MOUTH AT BEDTIME WITH OR IMMEDIATELY FOLLOWING A MEAL AS DIRECTED   nitroGLYCERIN (NITROSTAT) 0.4 MG SL tablet Place 1 tablet (0.4 mg total) under the tongue every 5 (five) minutes as needed for chest pain.   ondansetron (  ZOFRAN) 4 MG tablet Take 1 tablet (4 mg total) by mouth every 8 (eight) hours as needed for nausea or vomiting.   potassium chloride SA (KLOR-CON) 20 MEQ tablet Take 1 tablet (20 mEq total) by mouth daily.   QUEtiapine (SEROQUEL) 25 MG tablet Take 1 tablet (25 mg total) by mouth at bedtime.   REPATHA SURECLICK 818 MG/ML SOAJ INJECT 1 PEN INTO THE SKIN EVERY 14 DAYS   temazepam (RESTORIL) 30 MG capsule TAKE 1 CAPSULE AT BEDTIME AS NEEDED  FOR  SLEEP   TRUEplus Lancets 33G MISC Use to check blood sugars twice a  day   vitamin B-12 (CYANOCOBALAMIN) 100 MCG tablet Take 100 mcg by mouth daily.   No facility-administered encounter medications on file as of 12/15/2020.    Patient Active Problem List   Diagnosis Date Noted   RUQ abdominal pain 11/30/2020   LLQ abdominal pain 11/30/2020   Nausea & vomiting 11/08/2020   Primary osteoarthritis involving multiple joints 10/27/2020   S/P TAVR (transcatheter aortic valve replacement)    History of stroke 08/23/2020   Syncope 08/19/2020   Fracture of femoral condyle, closed ([place]) 08/19/2020   Severe aortic stenosis    Statin myopathy 04/08/2019   Well adult exam 08/07/2018   Hand weakness 08/07/2018   Gait disorder 08/07/2018   Headache 08/13/2017   Rash and nonspecific skin eruption 08/13/2017   Boil of buttock 05/09/2017   Shoulder pain, left 08/14/2016   Aortic stenosis, moderate 08/14/2016   Osteoporosis 11/01/2015   Cough 09/02/2015   Heart valve disease 06/14/2015   Insomnia 10/22/2013   UTI (urinary tract infection) 08/24/2013   Preop exam for internal medicine 01/16/2012   Ankle pain 07/11/2011   Adjustment disorder with mixed anxiety and depressed mood 03/28/2011   GERD (gastroesophageal reflux disease) 12/25/2010   Dysphagia 12/25/2010   Coronary atherosclerosis 02/09/2010   FEVER UNSPECIFIED 01/18/2010   CHEST PAIN 01/18/2010   Urinary incontinence 01/18/2010   BRONCHITIS NOT SPECIFIED AS ACUTE OR CHRONIC 12/05/2009   NEURALGIA, TRIGEMINAL 07/04/2009   EAR PAIN 07/04/2009   HYPOKALEMIA 02/17/2009   Arthralgia 02/17/2009   Hyperglycemia 09/02/2008   Osteoarthritis 03/11/2008   EDEMA 03/11/2008   KNEE PAIN 02/04/2008   CHEST XRAY, ABNORMAL 07/28/2007   Hyperlipidemia 03/17/2007   Essential hypertension 03/17/2007   UPPER RESPIRATORY INFECTION (URI) 03/17/2007   VAGINITIS 03/17/2007    Conditions to be addressed/monitored: CAD and HTN; Limited social support, Level of care concerns, and Limited access to caregiver  Care  Plan : LCSW Plan of Care  Updates made by Deirdre Peer, LCSW since 12/15/2020 12:00 AM     Problem: Long-Term Care Planning   Priority: High     Long-Range Goal: Provide pt and family support and resources for long term care planning   Start Date: 11/03/2020  Expected End Date: 02/17/2021  This Visit's Progress: On track  Recent Progress: On track  Priority: High  Note:   Current barriers:    Limited social support, Transportation, Level of care concerns, ADL IADL limitations, and Lacks knowledge of community resource:   Clinical Goals: Patient will work with CSW to address needs related to long term care planning Clinical Interventions: CSW spoke with pt's son, [name], by phone who reports pt has gotten a life alert type device and is using it. They are still looking into her long term care insurance policy. CSW encouraged son to have his brother(local/near pt) to assist with getting contact info from pt's home (hopefully a  packet, letter with #) to call and inquire about the coverage benefits. Also, reminded to look into her [employer] benefits (OTC catalog, transportation, etc).   Assessment of needs, barriers , agencies contacted, as well as how impacting  Solution-Focused Strategies Active listening / Reflection utilized  Veterinary surgeon Caregiver stress acknowledged  Review various resources, discussed options and provided patient information about  Referral to care guide (ADvance Directives, )  1:1 collaboration with primary care provider regarding development and update of comprehensive plan of care as evidenced by provider attestation and co-signature Inter-disciplinary care team collaboration (see longitudinal plan of care) Patient Goals/Self-Care Activities: Over the next 30 days -participate in [place] and other services as provided -review and consider completion of Advance Directives with Notary present to notarize - attend stroke recovery support group -  check with a lawyer who knows elder law - check with a financial planner - complete a Medical laboratory scientific officer; include caregiver - hold a family meeting with family members and loved ones - check out other places when staying at home is no longer possible (assisted living center, nursing home) - check out services like in-home help or adult day care        Follow Up Plan: SW will follow up with patient by phone over the next 4-6 weeks Eduard Clos MSW, [place] Licensed Clinical Social Worker [place] [phone]

## 2020-12-15 NOTE — Patient Instructions (Signed)
Visit Information  PATIENT GOALS:  Goals Addressed   None     The patient verbalized understanding of instructions, educational materials, and care plan provided today and declined offer to receive copy of patient instructions, educational materials, and care plan.   Telephone follow up appointment with care management team member scheduled for:01/25/21  Reece Levy MSW, LCSW Licensed Clinical Social Worker [facility] [place] [phone]

## 2020-12-19 ENCOUNTER — Other Ambulatory Visit: Payer: Self-pay

## 2020-12-19 MED ORDER — CLOPIDOGREL BISULFATE 75 MG PO TABS: 75.0000 mg | ORAL_TABLET | Freq: Every day | ORAL | 3 refills | Status: AC

## 2020-12-19 MED ORDER — CLOPIDOGREL BISULFATE 75 MG PO TABS: 75.0000 mg | ORAL_TABLET | Freq: Every day | ORAL | 0 refills | Status: DC

## 2020-12-20 ENCOUNTER — Telehealth: Payer: Self-pay | Admitting: Internal Medicine

## 2020-12-20 ENCOUNTER — Ambulatory Visit: Payer: Medicare HMO | Admitting: Internal Medicine

## 2020-12-20 NOTE — Telephone Encounter (Signed)
Noted. Thanks.

## 2020-12-20 NOTE — Telephone Encounter (Signed)
Home Health verbal orders-caller/Agency: [facility]/ [facility]  Callback number: [phone]  Requesting OT/PT/Skilled nursing/Social Work/Speech: OT  Frequency: 1w1 starting 12-25-2020  Therapist is requesting to extend patients OT  Therapist would like to make aware patient fell 12-14-2020, there is no report of injury.  Therapist states patient has soreness on left hip which patient states is feeling better

## 2020-12-20 NOTE — Telephone Encounter (Signed)
Terri King from [facility] calling in  Says she wants to make [name] aware that patient is showing possible signs of Dementia  Says patient is forgetting a lot & is more frequently confused  Patient is coming in for OV tomorrow 12/21/20  Callback # if needed [phone]

## 2020-12-21 ENCOUNTER — Ambulatory Visit: Payer: Medicare HMO | Admitting: Internal Medicine

## 2020-12-21 ENCOUNTER — Telehealth: Payer: Self-pay | Admitting: Internal Medicine

## 2020-12-21 NOTE — Telephone Encounter (Signed)
OK. Thx

## 2020-12-21 NOTE — Telephone Encounter (Signed)
Home Health verbal orders-caller/Agency: Myrtie Neither [place]/ [facility] home [facility]  Callback number: [phone]  Requesting OT/PT/Skilled nursing/Social Work/Speech: speech   Frequency: 1w6

## 2020-12-21 NOTE — Telephone Encounter (Signed)
Notified [facility] w/ MD response.Marland KitchenRaechel Chute

## 2020-12-22 NOTE — Telephone Encounter (Signed)
Notified [facility] w/ MD response../lmb °

## 2020-12-23 ENCOUNTER — Ambulatory Visit (INDEPENDENT_AMBULATORY_CARE_PROVIDER_SITE_OTHER): Payer: Medicare HMO

## 2020-12-23 ENCOUNTER — Other Ambulatory Visit: Payer: Self-pay

## 2020-12-23 DIAGNOSIS — I10 Essential (primary) hypertension: Secondary | ICD-10-CM

## 2020-12-23 DIAGNOSIS — E782 Mixed hyperlipidemia: Secondary | ICD-10-CM

## 2020-12-23 NOTE — Patient Instructions (Addendum)
Terri King,  It was great to talk to you today!  Please call me with any questions or concerns.  Visit Information   PATIENT GOALS/PLAN OF CARE:  Care Plan : [place]  Updates made by Tomasa Blase, RPH since 12/23/2020 12:00 AM     Problem: Hypertension, Hyperlipidemia/ History of stroke, Depression, and Insomnia   Priority: High  Onset Date: 12/23/2020     Long-Range Goal: Disease Management   Start Date: 12/23/2020  Expected End Date: 12/23/2021  This Visit's Progress: On track  Priority: High  Note:   Current Barriers:  Unable to independently monitor therapeutic efficacy  Pharmacist Clinical Goal(s):  Patient will achieve adherence to monitoring guidelines and medication adherence to achieve therapeutic efficacy achieve ability to self administer medications as prescribed through use of pill pack as evidenced by patient report through collaboration with PharmD and provider.   Interventions: 1:1 collaboration with [name], Evie Lacks, MD regarding development and update of comprehensive plan of care as evidenced by provider attestation and co-signature Inter-disciplinary care team collaboration (see longitudinal plan of care) Comprehensive medication review performed; medication list updated in electronic medical record  Hypertension (BP goal <140/90) -Controlled - at home - per home nurse  -Current treatment: Metoprolol succinate 56m - 1 tablet daily  Hydralazine 258m- 1 tablet 3 times daily  Amlodipine 1057m 1 tablet daily  -Medications previously tried: triamterene, HCTZ,   -Current home readings: home nurse reports that vitals have been normal, unable to recall specific number  -Current dietary habits: son and daughter-in-law trying to monitor food intake / reduce sodium  -Current exercise habits: PT once weekly, OT once weekly  -Denies hypotensive/hypertensive symptoms -Educated on BP goals and benefits of medications for prevention of  heart attack, stroke and kidney damage; Daily salt intake goal < 2300 mg; Importance of home blood pressure monitoring; Symptoms of hypotension and importance of maintaining adequate hydration; -Counseled to monitor BP at home at least once weekly, document, and provide log at future appointments -Counseled on diet and exercise extensively Recommended to continue current medication  Hyperlipidemia/ previous stroke: (LDL goal < 70) -Not ideally controlled Lab Results  Component Value Date   LDLCALC 83 08/23/2020  -Current treatment: Repatha 140m29m - 1 injection every 4 weeks- not currently taking  ASA 81mg88m tablet daily    Clopidogrel 75mg 58mtablet daily  Krill Oil 300mg -89mapsule daily  -Medications previously tried: atorvastatin, crestor,   -Current dietary patterns: tries to follow low cholesterol diet  -Current exercise habits:  PT once weekly, OT once weekly -Educated on Cholesterol goals;  Importance of limiting foods high in cholesterol; -Counseled on diet and exercise extensively Recommended for patient to restart repatha injections   Depression/ Insomnia (Goal: Promotion of positive mood / quality sleep) -Controlled -Current treatment  Quetiapine 25mg - 73mblet daily at bedtime  Temazepam 30mg - 156msule at bedtime as needed for sleep  - unsure if she is taking  Escitalopram 10mg - 1 67met daily  -Medications previously tried: n/a  -Counseled on on additive sedating effects of medications, advised for use of lowest amount of temazepam needed to control insomnia to help lower daytime sedation / fall risk    Health Maintenance -Vaccine gaps: COVD booster / shingles vaccine -Current therapy:  Vitamin B12 100mcg - 1 47met daily  APAP 325mg - 2 ta11ms every 6 hours as needed  Ondansetron 4mg - 1 tabl57mevery 8 hours as  needed  Famotidine 26m - 1 tablet twice daily Vitamin D 2000 units-  1 tablet daily  -Educated on Cost vs benefit of each product must be  carefully weighed by individual consumer -Patient is satisfied with current therapy and denies issues -Recommended to continue current medication  Patient Goals/Self-Care Activities Patient will:  - take medications as prescribed as evidenced by patient report and record review check blood pressure at least 3 times weekly, document, and provide at future appointments collaborate with provider on medication access solutions  Follow Up Plan: Telephone follow up appointment with care management team member scheduled for: 1 month The patient has been provided with contact information for the care management team and has been advised to call with any health related questions or concerns.      Consent to CCM Services: Ms. CTillerwas given information about Chronic Care Management services including:  CCM service includes personalized support from designated clinical staff supervised by her physician, including individualized plan of care and coordination with other care providers 24/7 contact phone numbers for assistance for urgent and routine care needs. Service will only be billed when office clinical staff spend 20 minutes or more in a month to coordinate care. Only one practitioner may furnish and bill the service in a calendar month. The patient may stop CCM services at any time (effective at the end of the month) by phone call to the office staff. The patient will be responsible for cost sharing (co-pay) of up to 20% of the service fee (after annual deductible is met).  Patient agreed to services and verbal consent obtained.   Patient verbalizes understanding of instructions provided today and [facility]   Telephone follow up appointment with care management team member scheduled for: 1 month The patient has been provided with contact information for the care management team and has been advised to call with any health related questions or concerns.   DTomasa Blase  PharmD Clinical Pharmacist, [place]

## 2020-12-23 NOTE — Progress Notes (Addendum)
Chronic Care Management Pharmacy Note  12/23/2020 Name:  Terri King MRN:  [id number] DOB:  12/18/1934  Summary: -spoke with patient's Son [name] and Daughter-in-Law [name] to discuss patient's medications today as they are now helping care for patient  -Reported that there are still uncertainties about what all patient is taking, per [name] his daughter fills patient's medications every week but she was not available for appointment today  -Since [facility] was [facility] patient has not been using her repatha - son was unsure if patient had any repatha remaining  -According to nurses reports BP has been well controlled on most recent home visits -Patient has concerns about chronic abdominal pain thought to be caused by gallstones, plans to follow up with PCP next week for further discussion -Unclear if patient has been taking medications for insomnia/ which medication she has been taking  -Patient has BG testing supplies, noted slight elevation of BG during latest hospital visit, but A1c has been in range w/o history of DM, advised to stop testing unless feeling hyper/hypoglycemic - reviewed signs and symptoms with patient's son and DIL  Recommendations/Changes made from today's visit: -Recommending for patient to restart repatha as she is statin intolerant and recently had a stroke  -Advised for discussion with granddaughter who manages medications to update med list, it was unknown if patient was taking many of her listed medications today, plan to follow up in 1 month to discuss again -Advised for patient to take temazepam only as needed for sleep, to continue quetiapine nightly for now, ideally would be able to wean temazepam to help reduce amount of sedating medications that patient is taking  -Patient follow up with PCP next week to discuss chronic abdominal pain / nausea   Subjective: ATHALIE NEWHARD is an 86 y.o. year old female who is a primary patient  of [name], Evie Lacks, MD.  The CCM team was consulted for assistance with disease management and care coordination needs.    Engaged with patient by telephone for initial visit in response to provider referral for pharmacy case management and/or care coordination services.   Consent to Services:  The patient was given the following information about Chronic Care Management services today, agreed to services, and gave verbal consent: 1. CCM service includes personalized support from designated clinical staff supervised by the primary care provider, including individualized plan of care and coordination with other care providers 2. 24/7 contact phone numbers for assistance for urgent and routine care needs. 3. Service will only be billed when office clinical staff spend 20 minutes or more in a month to coordinate care. 4. Only one practitioner may furnish and bill the service in a calendar month. 5.The patient may stop CCM services at any time (effective at the end of the month) by phone call to the office staff. 6. The patient will be responsible for cost sharing (co-pay) of up to 20% of the service fee (after annual deductible is met). Patient agreed to services and consent obtained.  Patient Care Team: [name], Evie Lacks, MD as PCP - General Fay Records, MD as PCP - Cardiology (Cardiology) Fay Records, MD (Cardiology) Deirdre Peer, LCSW as Social Worker (Licensed Clinical Social Worker) Delice Bison, Darnelle Maffucci, [facility] as Pharmacist (Pharmacist)  Recent office visits: 11/30/2020 - Dr. Alain Marion - complaints of nausea and vomiting / abdominal pain - given zofran and toradol - augmentin prescription sent to pharmacy as well as zofran and hydrocodone to be used as  needed 11/08/2020 - Dr. Alain Marion - f/u - recently dischared from [place] - restart temazepam for insomnia  10/27/2020 - Dr. Mitchel Honour - continue current medications - given flu shot   Recent consult visits: 10/12/20 - Frann Rider -  Neurology - f/u for recent stroke - referred to ophthalmology due to decreased left peripheral field f/u in 4 months  09/29/2020 - Angelena Form PA-C - cardiology - 1 month s/p TAVR - continue ASA and plavix for now - plavix to be stopped 02/2021, amlodipine increased to 11m daily  09/14/2020 - KMarita SnellenPA-C - s/p TAVR  [facility]: [facility] - admitted after being found of the floor at home by family - left lateral condyle fracture of the femur  - MRI of brain showed 531macute cortical infarct within the left occiptal lobe - underwent TAVR 08/30/20 and was discharged to SNF  Objective:  Lab Results  Component Value Date   CREATININE 1.11 12/01/2020   BUN 11 12/01/2020   GFR 45.57 (L) 12/01/2020   GFRNONAA >60 08/31/2020   GFRAA 72 10/30/2019   NA 138 12/01/2020   K 3.3 (L) 12/01/2020   CALCIUM 10.0 12/01/2020   CO2 25 12/01/2020   GLUCOSE 128 (H) 12/01/2020    Lab Results  Component Value Date/Time   HGBA1C 5.3 11/08/2020 11:03 AM   HGBA1C 5.4 08/30/2020 02:08 AM   GFR 45.57 (L) 12/01/2020 09:01 AM   GFR 71.82 11/08/2020 11:03 AM    Last diabetic Eye exam:  No results found for: HMDIABEYEEXA  Last diabetic Foot exam:  No results found for: HMDIABFOOTEX   Lab Results  Component Value Date   CHOL 153 08/23/2020   HDL 41 08/23/2020   LDLCALC 83 08/23/2020   LDLDIRECT 186.1 03/24/2013   TRIG 144 08/23/2020   CHOLHDL 3.7 08/23/2020    Hepatic Function Latest Ref Rng & Units 12/01/2020 11/08/2020 08/29/2020  Total Protein 6.0 - 8.3 g/dL 6.6 6.9 6.2(L)  Albumin 3.5 - 5.2 g/dL 3.9 3.7 3.0(L)  AST 0 - 37 U/L 32 25 24  ALT 0 - 35 U/L 18 14 32  Alk Phosphatase 39 - 117 U/L 103 86 92  Total Bilirubin 0.2 - 1.2 mg/dL 1.4(H) 1.1 1.1  Bilirubin, Direct 0.0 - 0.3 mg/dL - - -    Lab Results  Component Value Date/Time   TSH 0.89 11/08/2020 11:03 AM   TSH 1.542 08/23/2020 02:27 PM   TSH 1.70 05/10/2020 10:51 AM     CBC Latest Ref Rng & Units 12/01/2020 11/08/2020 09/01/2020  WBC 4.0 - 10.5 K/uL 7.6 6.5 12.9(H)  Hemoglobin 12.0 - 15.0 g/dL 13.4 12.6 11.1(L)  Hematocrit 36.0 - 46.0 % 38.6 37.0 32.0(L)  Platelets 150.0 - 400.0 K/uL 257.0 250.0 224    Lab Results  Component Value Date/Time   VD25OH 52.46 08/07/2018 09:21 AM   VD25OH 54 09/11/2010 09:25 AM   VD25OH 38 08/30/2008 08:28 PM    Clinical ASCVD: Yes  The ASCVD Risk score (Arnett DK, et al., 2019) failed to calculate for the following reasons:   The 2019 ASCVD risk score is only valid for ages 4066o 7971 The patient has a prior MI or stroke diagnosis    [facility] 05/31/2020 05/14/2019 08/07/2018  Decreased Interest 0 0 0  Down, Depressed, Hopeless 0 0 0  PHQ - 2 Score 0 0 0  Altered sleeping - - -  Tired, decreased energy - - -  Change in appetite - - -  Feeling bad or failure about yourself  - - -  Trouble concentrating - - -  Moving slowly or fidgety/restless - - -  Suicidal thoughts - - -  PHQ-9 Score - - -  Difficult doing work/chores - - -  Some recent data might be hidden    Social History   Tobacco Use  Smoking Status Never  Smokeless Tobacco Never   BP Readings from Last 3 Encounters:  11/30/20 (!) 162/80  11/08/20 (!) 152/68  10/27/20 (!) 148/76   Pulse Readings from Last 3 Encounters:  11/30/20 98  11/08/20 84  10/27/20 72   Wt Readings from Last 3 Encounters:  11/08/20 151 lb 9.6 oz (68.8 kg)  10/27/20 158 lb (71.7 kg)  09/29/20 155 lb (70.3 kg)   BMI Readings from Last 3 Encounters:  11/08/20 26.85 kg/m  10/27/20 27.99 kg/m  09/29/20 27.46 kg/m    Assessment/Interventions: Review of patient past medical history, allergies, medications, health status, including review of consultants reports, laboratory and other test data, was performed as part of comprehensive evaluation and provision of chronic care management services.    SDOH Screenings   Alcohol Screen: Low Risk    Last  Alcohol Screening Score (AUDIT): 0  Depression (PHQ2-9): Low Risk    PHQ-2 Score: 0  Financial Resource Strain: Low Risk    Difficulty of Paying Living Expenses: Not hard at all  Food Insecurity: No Food Insecurity   Worried About Charity fundraiser in the Last Year: Never true   Ran Out of Food in the Last Year: Never true  Housing: Low Risk    Last Housing Risk Score: 0  Physical Activity: Insufficiently Active   Days of Exercise per Week: 3 days   Minutes of Exercise per Session: 20 min  Social Connections: Moderately Integrated   Frequency of Communication with Friends and Family: More than three times a week   Frequency of Social Gatherings with Friends and Family: Once a week   Attends Religious Services: 1 to 4 times per year   Active Member of [employer] or Organizations: No   Attends Archivist Meetings: 1 to 4 times per year   Marital Status: Widowed  Stress: No Stress Concern Present   Feeling of Stress : Not at all  Tobacco Use: Low Risk    Smoking Tobacco Use: Never   Smokeless Tobacco Use: Never   Passive Exposure: Not on file  Transportation Needs: No Transportation Needs   Lack of Transportation (Medical): No   Lack of Transportation (Non-Medical): No    CCM Care Plan  Allergies  Allergen Reactions   Codeine Other (See Comments)    Abnormal behavior   Crestor [Rosuvastatin]     Myalgias    Dexilant [Dexlansoprazole]     Made me sick   Ibuprofen Nausea And Vomiting   Simvastatin     REACTION: achy   Iodine Swelling and Rash    Medications Reviewed Today     Reviewed by Tomasa Blase, [facility] (Pharmacist) on 12/23/20 at 1452  Med List Status: <None>   Medication Order Taking? Sig Documenting Provider Last Dose Status Informant  acetaminophen (TYLENOL) 325 MG tablet [id number] No Take 650 mg by mouth every 6 (six) hours as needed. [provider] Unknown Active   Alcohol Swabs (B-D SINGLE USE SWABS REGULAR) PADS [id number]  1 each by Does  not apply route as directed. Dx: R73.9 [name], Evie Lacks, MD  Active   amLODipine (NORVASC) 10 MG tablet [id number] No Take 1 tablet (10 mg total) by mouth daily. Eileen Stanford, PA-C Unknown Active   amoxicillin (AMOXIL) 500 MG tablet [id number] No Take FOUR TABLETS (2000 mg) by mouth ONE HOUR before any dental procedures Eileen Stanford, PA-C Unknown Active   aspirin EC 81 MG tablet [id number] Yes Take 81 mg by mouth daily. Swallow whole. [provider] Taking Active   Blood Glucose Calibration (TRUE METRIX LEVEL 1) Low SOLN [id number]  1 each by In Vitro route as directed. Dx: R73.9 [name], Evie Lacks, MD  Active   Blood Glucose Monitoring Suppl (TRUE METRIX METER) w/Device KIT [id number]  USE AS DIRECTED [name], Evie Lacks, MD  Active   Cholecalciferol (VITAMIN D3) 50 MCG (2000 UT) capsule [id number] No Take 1 capsule (2,000 Units total) by mouth daily. [name], Evie Lacks, MD Unknown Active   clopidogrel (PLAVIX) 75 MG tablet [id number] No Take 1 tablet (75 mg total) by mouth daily with breakfast. Eileen Stanford, PA-C Unknown Active   escitalopram (LEXAPRO) 10 MG tablet [id number] Yes TAKE 1 TABLET(10 MG) BY MOUTH DAILY [name], Evie Lacks, MD Taking Active   famotidine (PEPCID) 20 MG tablet [id number] Yes Take 1 tablet (20 mg total) by mouth 2 (two) times daily. [name], Evie Lacks, MD Taking Active   glucose blood (TRUE METRIX BLOOD GLUCOSE TEST) test strip [id number]  Use to check blood sugars twice a day [name], Evie Lacks, MD  Active   hydrALAZINE (APRESOLINE) 25 MG tablet [id number] Yes Take 25 mg by mouth 3 (three) times daily. [provider] Taking Active   HYDROcodone-acetaminophen (NORCO) 5-325 MG tablet [id number] No Take 0.5-1 tablets by mouth every 6 (six) hours as needed for severe pain. [name], Evie Lacks, MD Unknown Active   Krill Oil (MAXIMUM RED KRILL) 300 MG CAPS [id number] Yes 1 po qd [name], Evie Lacks, MD Taking Active   metoprolol  succinate (TOPROL-XL) 50 MG 24 hr tablet [id number] Yes TAKE 1 TABLET(50 MG) BY MOUTH AT BEDTIME WITH OR IMMEDIATELY FOLLOWING A MEAL AS DIRECTED [name], Evie Lacks, MD Taking Active   nitroGLYCERIN (NITROSTAT) 0.4 MG SL tablet [id number] No Place 1 tablet (0.4 mg total) under the tongue every 5 (five) minutes as needed for chest pain.  Patient not taking: Reported on 12/23/2020   Liliane Shi, PA-C Not Taking Active   ondansetron [facility]) 4 MG tablet [id number] No Take 1 tablet (4 mg total) by mouth every 8 (eight) hours as needed for nausea or vomiting. [name], Evie Lacks, MD Unknown Active   potassium chloride SA (KLOR-CON) 20 MEQ tablet [id number] Yes Take 1 tablet (20 mEq total) by mouth daily. [name], Evie Lacks, MD Taking Active   QUEtiapine (SEROQUEL) 25 MG tablet [id number] No Take 1 tablet (25 mg total) by mouth at bedtime. Shelly Coss, MD Unknown Active   REPATHA SURECLICK 482 MG/ML SOAJ [id number] No INJECT 1 PEN INTO THE SKIN EVERY 14 DAYS Fay Records, MD Unknown Active   temazepam (RESTORIL) 30 MG capsule [id number] No TAKE 1 CAPSULE AT BEDTIME AS NEEDED  FOR  SLEEP [name], Evie Lacks, MD Unknown Active   TRUEplus Lancets 33G MISC [id number]  Use to check blood sugars twice a day [name], Evie Lacks, MD  Active   vitamin B-12 (CYANOCOBALAMIN) 100 MCG tablet [id number] No Take 100 mcg by mouth daily. [provider] Unknown Active Self            Patient Active Problem List  Diagnosis Date Noted   RUQ abdominal pain 11/30/2020   LLQ abdominal pain 11/30/2020   Nausea & vomiting 11/08/2020   Primary osteoarthritis involving multiple joints 10/27/2020   S/P TAVR (transcatheter aortic valve replacement)    History of stroke 08/23/2020   Syncope 08/19/2020   Fracture of femoral condyle, closed ([place]) 08/19/2020   Severe aortic stenosis    Statin myopathy 04/08/2019   Well adult exam 08/07/2018   Hand weakness 08/07/2018   Gait disorder 08/07/2018   Headache  08/13/2017   Rash and nonspecific skin eruption 08/13/2017   Boil of buttock 05/09/2017   Shoulder pain, left 08/14/2016   Aortic stenosis, moderate 08/14/2016   Osteoporosis 11/01/2015   Cough 09/02/2015   Heart valve disease 06/14/2015   Insomnia 10/22/2013   UTI (urinary tract infection) 08/24/2013   Preop exam for internal medicine 01/16/2012   Ankle pain 07/11/2011   Adjustment disorder with mixed anxiety and depressed mood 03/28/2011   GERD (gastroesophageal reflux disease) 12/25/2010   Dysphagia 12/25/2010   Coronary atherosclerosis 02/09/2010   FEVER UNSPECIFIED 01/18/2010   CHEST PAIN 01/18/2010   Urinary incontinence 01/18/2010   BRONCHITIS NOT SPECIFIED AS ACUTE OR CHRONIC 12/05/2009   NEURALGIA, TRIGEMINAL 07/04/2009   EAR PAIN 07/04/2009   HYPOKALEMIA 02/17/2009   Arthralgia 02/17/2009   Hyperglycemia 09/02/2008   Osteoarthritis 03/11/2008   EDEMA 03/11/2008   KNEE PAIN 02/04/2008   CHEST XRAY, ABNORMAL 07/28/2007   Hyperlipidemia 03/17/2007   Essential hypertension 03/17/2007   UPPER RESPIRATORY INFECTION (URI) 03/17/2007   VAGINITIS 03/17/2007    Immunization History  Administered Date(s) Administered   Fluad Quad(high Dose 65+) 11/10/2018, 10/27/2020   Influenza, High Dose Seasonal PF 12/03/2012, 11/01/2015, 11/01/2016, 11/13/2017   Influenza,inj,Quad PF,6+ Mos 10/22/2013, 10/30/2019   Influenza-Unspecified 11/28/2014   Moderna Sars-Covid-2 Vaccination 04/21/2019, 05/20/2019, 12/16/2019   Pneumococcal Conjugate-13 11/01/2015   Pneumococcal Polysaccharide-23 02/17/2009, 01/24/2012, 11/10/2018   Td 02/09/2010   Tdap 10/30/2019   Zoster, Live 04/20/2013    Conditions to be addressed/monitored:  Hypertension, Hyperlipidemia, Depression, and Insomnia  Care Plan : [place]  Updates made by Tomasa Blase, RPH since 12/23/2020 12:00 AM     Problem: Hypertension, Hyperlipidemia/ History of stroke, Depression, and Insomnia   Priority:  High  Onset Date: 12/23/2020     Long-Range Goal: Disease Management   Start Date: 12/23/2020  Expected End Date: 12/23/2021  This Visit's Progress: On track  Priority: High  Note:   Current Barriers:  Unable to independently monitor therapeutic efficacy  Pharmacist Clinical Goal(s):  Patient will achieve adherence to monitoring guidelines and medication adherence to achieve therapeutic efficacy achieve ability to self administer medications as prescribed through use of pill pack as evidenced by patient report through collaboration with PharmD and provider.   Interventions: 1:1 collaboration with [name], Evie Lacks, MD regarding development and update of comprehensive plan of care as evidenced by provider attestation and co-signature Inter-disciplinary care team collaboration (see longitudinal plan of care) Comprehensive medication review performed; medication list updated in electronic medical record  Hypertension (BP goal <140/90) -Controlled - at home - per home nurse  -Current treatment: Metoprolol succinate 10m - 1 tablet daily  Hydralazine 253m- 1 tablet 3 times daily  Amlodipine 1041m 1 tablet daily  -Medications previously tried: triamterene, HCTZ,   -Current home readings: home nurse reports that vitals have been normal, unable to recall specific number  -Current dietary habits: son and daughter-in-law trying to monitor food intake / reduce sodium  -  Current exercise habits: PT once weekly, OT once weekly  -Denies hypotensive/hypertensive symptoms -Educated on BP goals and benefits of medications for prevention of heart attack, stroke and kidney damage; Daily salt intake goal < 2300 mg; Importance of home blood pressure monitoring; Symptoms of hypotension and importance of maintaining adequate hydration; -Counseled to monitor BP at home at least once weekly, document, and provide log at future appointments -Counseled on diet and exercise extensively Recommended to  continue current medication  Hyperlipidemia/ previous stroke: (LDL goal < 70) -Not ideally controlled Lab Results  Component Value Date   LDLCALC 83 08/23/2020  -Current treatment: Repatha 120m/mL - 1 injection every 4 weeks- not currently taking  ASA 873m- 1 tablet daily    Clopidogrel 7580m 1 tablet daily  Krill Oil 300m61m1 capsule daily  -Medications previously tried: atorvastatin, crestor,   -Current dietary patterns: tries to follow low cholesterol diet  -Current exercise habits:  PT once weekly, OT once weekly -Educated on Cholesterol goals;  Importance of limiting foods high in cholesterol; -Counseled on diet and exercise extensively Recommended for patient to restart repatha injections   Depression/ Insomnia (Goal: Promotion of positive mood / quality sleep) -Controlled -Current treatment  Quetiapine 25mg14m tablet daily at bedtime  Temazepam 30mg 18mcapsule at bedtime as needed for sleep  - unsure if she is taking  Escitalopram 10mg -66mablet daily  -Medications previously tried: n/a  -Counseled on on additive sedating effects of medications, advised for use of lowest amount of temazepam needed to control insomnia to help lower daytime sedation / fall risk    Health Maintenance -Vaccine gaps: COVD booster / shingles vaccine -Current therapy:  Vitamin B12 100mcg -3mablet daily  APAP 325mg - 255mlets every 6 hours as needed  Ondansetron 4mg - 1 t46met every 8 hours as needed  Famotidine 20mg - 1 t47mt twice daily Vitamin D 2000 units-  1 tablet daily  -Educated on Cost vs benefit of each product must be carefully weighed by individual consumer -Patient is satisfied with current therapy and denies issues -Recommended to continue current medication  Patient Goals/Self-Care Activities Patient will:  - take medications as prescribed as evidenced by patient report and record review check blood pressure at least 3 times weekly, document, and provide at future  appointments collaborate with provider on medication access solutions  Follow Up Plan: Telephone follow up appointment with care management team member scheduled for: 1 month The patient has been provided with contact information for the care management team and has been advised to call with any health related questions or concerns.        Medication Assistance: None required.  Patient affirms current coverage meets needs.  Patient's [facility]:  [facility] [phone] [id number] [address] [address] [phone] [phone] box? Yes - filled by patient's granddaughter  Pt endorses ??% compliance  Care Plan and Follow Up Patient Decision:  Patient agrees to Care Plan and Follow-up.  Plan: Telephone follow up appointment with care management team member  scheduled for:  1 month The patient has been provided with contact information for the care management team and has been advised to call with any health related questions or concerns.   Tomasa Blase, PharmD Clinical Pharmacist, [place] screening examination/treatment/procedure(s) were performed by non-physician practitioner and as supervising physician I was immediately available for consultation/collaboration.  I agree with above. Lew Dawes, MD

## 2020-12-26 ENCOUNTER — Telehealth: Payer: Medicare HMO

## 2020-12-27 ENCOUNTER — Telehealth: Payer: Self-pay | Admitting: Internal Medicine

## 2020-12-27 ENCOUNTER — Other Ambulatory Visit: Payer: Self-pay

## 2020-12-27 ENCOUNTER — Encounter: Payer: Self-pay | Admitting: Internal Medicine

## 2020-12-27 ENCOUNTER — Ambulatory Visit (INDEPENDENT_AMBULATORY_CARE_PROVIDER_SITE_OTHER): Payer: Medicare HMO | Admitting: Internal Medicine

## 2020-12-27 VITALS — BP 162/70 | HR 59 | Temp 98.8°F | Ht 63.0 in | Wt 148.8 lb

## 2020-12-27 DIAGNOSIS — F5101 Primary insomnia: Secondary | ICD-10-CM | POA: Diagnosis not present

## 2020-12-27 DIAGNOSIS — I10 Essential (primary) hypertension: Secondary | ICD-10-CM

## 2020-12-27 DIAGNOSIS — F02B0 Dementia in other diseases classified elsewhere, moderate, without behavioral disturbance, psychotic disturbance, mood disturbance, and anxiety: Secondary | ICD-10-CM | POA: Diagnosis not present

## 2020-12-27 DIAGNOSIS — R739 Hyperglycemia, unspecified: Secondary | ICD-10-CM

## 2020-12-27 DIAGNOSIS — G301 Alzheimer's disease with late onset: Secondary | ICD-10-CM

## 2020-12-27 DIAGNOSIS — R6889 Other general symptoms and signs: Secondary | ICD-10-CM | POA: Diagnosis not present

## 2020-12-27 DIAGNOSIS — F0390 Unspecified dementia without behavioral disturbance: Secondary | ICD-10-CM | POA: Insufficient documentation

## 2020-12-27 MED ORDER — QUETIAPINE FUMARATE 25 MG PO TABS: 25.0000 mg | ORAL_TABLET | Freq: Every day | ORAL | 1 refills | Status: DC

## 2020-12-27 MED ORDER — LORATADINE 10 MG PO TABS: 10.0000 mg | ORAL_TABLET | Freq: Every day | ORAL | 1 refills | Status: AC | PRN

## 2020-12-27 MED ORDER — HYDRALAZINE HCL 25 MG PO TABS: 25.0000 mg | ORAL_TABLET | Freq: Three times a day (TID) | ORAL | 3 refills | Status: DC

## 2020-12-27 MED ORDER — AMLODIPINE BESYLATE 10 MG PO TABS: 10.0000 mg | ORAL_TABLET | Freq: Every day | ORAL | 3 refills | Status: DC

## 2020-12-27 NOTE — Assessment & Plan Note (Signed)
Cont Toprol Re-start  Hydralazine and Norvasc if BP is high

## 2020-12-27 NOTE — Assessment & Plan Note (Signed)
9/22 Worse  Quetiapin alone is not helping. Re-start Temazepam in addition to [place]

## 2020-12-27 NOTE — Progress Notes (Signed)
Subjective:  Patient ID: Terri King, female    DOB: 03/02/1934  Age: 86 y.o. MRN: [id number]  CC: Follow-up (6 week f/u- C/o leg pain in both) and Medication Problem (Need to discuss medication. Need to know what she is supposed to be taking per grand-daughter)   HPI Terri King presents for HTN, dementia, hallucinations Pt is here w/her GD - William Hamburger - she is a Education officer, museum, moved from [place], lives 20 min away.  Pt is taking Toprol only. No Hydralazine and Norvasc at home [facility] is visiting at home past couple weeks SBP 120 at home.  C/o memory loss. Out of Seroquel .  Outpatient Medications Prior to Visit  Medication Sig Dispense Refill   acetaminophen (TYLENOL) 325 MG tablet Take 650 mg by mouth every 6 (six) hours as needed for mild pain or headache.     aspirin EC 81 MG tablet Take 81 mg by mouth daily. Swallow whole.     Cholecalciferol (VITAMIN D3) 50 MCG (2000 UT) capsule Take 1 capsule (2,000 Units total) by mouth daily. 100 capsule 3   clopidogrel (PLAVIX) 75 MG tablet Take 1 tablet (75 mg total) by mouth daily with breakfast. 90 tablet 3   escitalopram (LEXAPRO) 10 MG tablet TAKE 1 TABLET(10 MG) BY MOUTH DAILY (Patient taking differently: Take 10 mg by mouth daily.) 30 tablet 5   famotidine (PEPCID) 20 MG tablet Take 1 tablet (20 mg total) by mouth 2 (two) times daily. 180 tablet 3   gabapentin (NEURONTIN) 100 MG capsule Take 100 mg by mouth 3 (three) times daily.     Krill Oil (MAXIMUM RED KRILL) 300 MG CAPS 1 po qd (Patient taking differently: Take 300 mg by mouth daily.) 100 capsule 3   metoprolol succinate (TOPROL-XL) 50 MG 24 hr tablet TAKE 1 TABLET(50 MG) BY MOUTH AT BEDTIME WITH OR IMMEDIATELY FOLLOWING A MEAL AS DIRECTED (Patient taking differently: Take 50 mg by mouth daily.) 30 tablet 11   ondansetron (ZOFRAN) 4 MG tablet Take 1 tablet (4 mg total) by mouth every 8 (eight) hours as needed for nausea or vomiting. 63 tablet 0   potassium chloride SA  (KLOR-CON) 20 MEQ tablet Take 1 tablet (20 mEq total) by mouth daily. 30 tablet 3   temazepam (RESTORIL) 30 MG capsule TAKE 1 CAPSULE AT BEDTIME AS NEEDED  FOR  SLEEP (Patient taking differently: Take 30 mg by mouth at bedtime as needed for sleep.) 90 capsule 1   vitamin B-12 (CYANOCOBALAMIN) 100 MCG tablet Take 100 mcg by mouth daily.     Alcohol Swabs (B-D SINGLE USE SWABS REGULAR) PADS 1 each by Does not apply route as directed. Dx: R73.9 180 each 3   HYDROcodone-acetaminophen (NORCO) 5-325 MG tablet Take 0.5-1 tablets by mouth every 6 (six) hours as needed for severe pain. 20 tablet 0   amoxicillin (AMOXIL) 500 MG tablet Take FOUR TABLETS (2000 mg) by mouth ONE HOUR before any dental procedures (Patient taking differently: Take 2,000 mg by mouth See admin instructions. 1 hour before any dental procedures) 12 tablet 2   nitroGLYCERIN (NITROSTAT) 0.4 MG SL tablet Place 1 tablet (0.4 mg total) under the tongue every 5 (five) minutes as needed for chest pain. 25 tablet 6   REPATHA SURECLICK 270 MG/ML SOAJ INJECT 1 PEN INTO THE SKIN EVERY 14 DAYS (Patient not taking: Reported on 12/20/2020) 6 mL 3   amLODipine (NORVASC) 10 MG tablet Take 1 tablet (10 mg total) by mouth daily. (Patient not  taking: Reported on 12/27/2020) 90 tablet 1   Blood Glucose Calibration (TRUE METRIX LEVEL 1) Low SOLN 1 each by In Vitro route as directed. Dx: R73.9 (Patient not taking: No sig reported) 3 each 3   Blood Glucose Monitoring Suppl (TRUE METRIX METER) w/Device KIT USE AS DIRECTED (Patient not taking: No sig reported) 1 kit 0   glucose blood (TRUE METRIX BLOOD GLUCOSE TEST) test strip Use to check blood sugars twice a day (Patient not taking: No sig reported) 180 each 3   hydrALAZINE (APRESOLINE) 25 MG tablet Take 25 mg by mouth 3 (three) times daily. (Patient not taking: Reported on 12/27/2020)     QUEtiapine (SEROQUEL) 25 MG tablet Take 1 tablet (25 mg total) by mouth at bedtime. (Patient not taking: Reported on  12/27/2020)     TRUEplus Lancets 33G MISC Use to check blood sugars twice a day (Patient not taking: No sig reported) 180 each 3   No facility-administered medications prior to visit.    ROS: Review of Systems  Constitutional:  Positive for fatigue. Negative for activity change, appetite change, chills and unexpected weight change.  HENT:  Negative for congestion, mouth sores and sinus pressure.   Eyes:  Negative for visual disturbance.  Respiratory:  Negative for cough and chest tightness.   Gastrointestinal:  Negative for abdominal pain and nausea.  Genitourinary:  Negative for difficulty urinating, frequency and vaginal pain.  Musculoskeletal:  Positive for arthralgias and gait problem. Negative for back pain.  Skin:  Negative for pallor and rash.  Neurological:  Positive for weakness. Negative for dizziness, tremors, numbness and headaches.  Psychiatric/Behavioral:  Positive for decreased concentration, hallucinations and sleep disturbance. Negative for confusion and suicidal ideas. The patient is nervous/anxious.    Objective:  BP (!) 162/70 (BP Location: Left Arm)   Pulse (!) 59   Temp 98.8 F (37.1 C) (Oral)   Ht _0  (1.6 m)   Wt 148 lb 12.8 oz (67.5 kg)   SpO2 97%   BMI 26.36 kg/m   BP Readings from Last 3 Encounters:  01-24-21 (!) 119/42  12/29/20 (!) 148/88  12/27/20 (!) 162/70    Wt Readings from Last 3 Encounters:  24-Jan-2021 155 lb 10.3 oz (70.6 kg)  12/29/20 147 lb 6.4 oz (66.9 kg)  12/27/20 148 lb 12.8 oz (67.5 kg)    Physical Exam Constitutional:      General: She is not in acute distress.    Appearance: Normal appearance. She is well-developed.  HENT:     Head: Normocephalic.     Right Ear: External ear normal.     Left Ear: External ear normal.     Nose: Nose normal.  Eyes:     General:        Right eye: No discharge.        Left eye: No discharge.     Conjunctiva/sclera: Conjunctivae normal.     Pupils: Pupils are equal, round, and reactive to  light.  Neck:     Thyroid: No thyromegaly.     Vascular: No JVD.     Trachea: No tracheal deviation.  Cardiovascular:     Rate and Rhythm: Normal rate and regular rhythm.     Heart sounds: Normal heart sounds.  Pulmonary:     Effort: No respiratory distress.     Breath sounds: No stridor. No wheezing.  Abdominal:     General: Bowel sounds are normal. There is no distension.     Palpations: Abdomen is soft.  There is no mass.     Tenderness: There is no abdominal tenderness. There is no guarding or rebound.  Musculoskeletal:        General: Tenderness present.     Cervical back: Normal range of motion and neck supple. No rigidity.  Lymphadenopathy:     Cervical: No cervical adenopathy.  Skin:    Findings: No erythema or rash.  Neurological:     Mental Status: Mental status is at baseline.     Cranial Nerves: No cranial nerve deficit.     Motor: No abnormal muscle tone.     Coordination: Coordination abnormal.     Gait: Gait abnormal.     Deep Tendon Reflexes: Reflexes normal.  Psychiatric:        Behavior: Behavior normal.        Thought Content: Thought content normal.        Judgment: Judgment normal.    Lab Results  Component Value Date   WBC 18.2 (H) 12/21/2020   HGB 11.9 (L) 01/14/2021   HCT 35.0 (L) 01/18/2021   PLT 265 12/23/2020   GLUCOSE 223 (H) 2021/01/27   CHOL 153 08/23/2020   TRIG 77 27-Jan-2021   HDL 41 08/23/2020   LDLDIRECT 186.1 03/24/2013   LDLCALC 83 08/23/2020   ALT 17 12/22/2020   AST 35 01/17/2021   NA 138 01-27-21   K 3.7 2021/01/27   CL 108 January 27, 2021   CREATININE 0.96 01/27/2021   BUN 8 2021/01/27   CO2 14 (L) 01/27/21   TSH 0.89 11/08/2020   INR 1.0 12/25/2020   HGBA1C 5.2 12/27/2020    CT KNEE LEFT WO CONTRAST  Result Date: 10/06/2020 CLINICAL DATA:  [place] 1 month ago.  Left knee pain. EXAM: CT OF THE left KNEE WITHOUT CONTRAST TECHNIQUE: Multidetector CT imaging of the left knee was performed according to the standard protocol.  Multiplanar CT image reconstructions were also generated. COMPARISON:  Radiographs 08/19/2020 FINDINGS: The femoral and tibial components are well seated. I do not see any obvious complicating features such as loosening or periprosthetic fracture. On the axial and coronal images there is a small cortical fracture involving the medial femoral condyle in the epicondylar region anteriorly. This appears to be partially healed. The patella is intact. The quadriceps and patellar tendons are grossly intact. There is a moderate-sized suprapatellar knee joint effusion with associated synovial thickening. IMPRESSION: 1. Small partially healed cortical fracture involving the medial femoral condyle in the epicondylar region anteriorly. 2. No complicating features associated with the total knee arthroplasty components. 3. Moderate-sized suprapatellar knee joint effusion with associated synovial thickening. Electronically Signed   By: Marijo Sanes M.D.   On: 10/06/2020 16:46    Assessment & Plan:   Problem List Items Addressed This Visit     Dementia ([place])     Worse  Planning to move to Assisted Living at some point [place] alone is not helping. Re-start Temazepam in addition to Quetiapine for insomnia      Relevant Medications   gabapentin (NEURONTIN) 100 MG capsule   Essential hypertension    Cont Toprol Re-start  Hydralazine and Norvasc if BP is high      Relevant Orders   Comprehensive metabolic panel (Completed)   Urinalysis   Hyperglycemia - Primary   Relevant Orders   Hemoglobin A1c (Completed)   Insomnia    9/22 Worse  Quetiapin alone is not helping. Re-start Temazepam in addition to [employer] ordered  this encounter  Medications   DISCONTD: QUEtiapine (SEROQUEL) 25 MG tablet    Sig: Take 1 tablet (25 mg total) by mouth at bedtime.    Dispense:  90 tablet    Refill:  1   DISCONTD: hydrALAZINE (APRESOLINE) 25 MG tablet    Sig: Take 1 tablet (25 mg total) by mouth 3  (three) times daily.    Dispense:  270 tablet    Refill:  3   DISCONTD: amLODipine (NORVASC) 10 MG tablet    Sig: Take 1 tablet (10 mg total) by mouth daily.    Dispense:  90 tablet    Refill:  3   loratadine (CLARITIN) 10 MG tablet    Sig: Take 1 tablet (10 mg total) by mouth daily as needed for allergies.    Dispense:  100 tablet    Refill:  1      Follow-up: Return in about 6 weeks (around 02/07/2021) for a follow-up visit.  Walker Kehr, MD

## 2020-12-27 NOTE — Telephone Encounter (Signed)
HH ORDERS   Caller Name: [facility] Agency Name: [facility] Phone #: [phone] Service Requested: OT- ADL, Meal Prep, Fall Reduction Frequency of Visits: 1 week 1

## 2020-12-27 NOTE — Assessment & Plan Note (Signed)
Worse  Planning to move to Assisted Living at some point [place] alone is not helping. Re-start Temazepam in addition to Quetiapine for insomnia

## 2020-12-28 LAB — URINALYSIS, ROUTINE W REFLEX MICROSCOPIC
Bilirubin Urine: NEGATIVE
Hgb urine dipstick: NEGATIVE
Leukocytes,Ua: NEGATIVE
Nitrite: NEGATIVE
RBC / HPF: NONE SEEN (ref 0–?)
Specific Gravity, Urine: 1.015 (ref 1.000–1.030)
Urine Glucose: NEGATIVE
Urobilinogen, UA: 2 — AB (ref 0.0–1.0)
pH: 8 (ref 5.0–8.0)

## 2020-12-28 LAB — COMPREHENSIVE METABOLIC PANEL
ALT: 13 U/L (ref 0–35)
AST: 25 U/L (ref 0–37)
Albumin: 4 g/dL (ref 3.5–5.2)
Alkaline Phosphatase: 101 U/L (ref 39–117)
BUN: 10 mg/dL (ref 6–23)
CO2: 26 mEq/L (ref 19–32)
Calcium: 9.8 mg/dL (ref 8.4–10.5)
Chloride: 106 mEq/L (ref 96–112)
Creatinine, Ser: 0.87 mg/dL (ref 0.40–1.20)
GFR: 61.01 mL/min (ref >60.00)
Glucose, Bld: 101 mg/dL — ABNORMAL HIGH (ref 70–99)
Potassium: 4.1 mEq/L (ref 3.5–5.1)
Sodium: 139 mEq/L (ref 135–145)
Total Bilirubin: 0.8 mg/dL (ref 0.2–1.2)
Total Protein: 6.7 g/dL (ref 6.0–8.3)

## 2020-12-28 LAB — HEMOGLOBIN A1C: Hgb A1c MFr Bld: 5.2 % (ref 4.6–6.5)

## 2020-12-28 NOTE — Telephone Encounter (Signed)
Notified [place] ok for verbal OT orders. Pt is compliance w/ appts.Marland KitchenRaechel Chute

## 2020-12-28 NOTE — Telephone Encounter (Signed)
Okay.  Thanks.

## 2020-12-29 ENCOUNTER — Encounter: Payer: Self-pay | Admitting: Physician Assistant

## 2020-12-29 ENCOUNTER — Telehealth: Payer: Self-pay | Admitting: Physician Assistant

## 2020-12-29 ENCOUNTER — Ambulatory Visit (INDEPENDENT_AMBULATORY_CARE_PROVIDER_SITE_OTHER): Payer: Medicare HMO | Admitting: Physician Assistant

## 2020-12-29 VITALS — BP 148/88 | HR 66 | Ht 63.0 in | Wt 147.4 lb

## 2020-12-29 DIAGNOSIS — R1013 Epigastric pain: Secondary | ICD-10-CM

## 2020-12-29 DIAGNOSIS — K8020 Calculus of gallbladder without cholecystitis without obstruction: Secondary | ICD-10-CM

## 2020-12-29 DIAGNOSIS — K219 Gastro-esophageal reflux disease without esophagitis: Secondary | ICD-10-CM | POA: Diagnosis not present

## 2020-12-29 DIAGNOSIS — R1310 Dysphagia, unspecified: Secondary | ICD-10-CM

## 2020-12-29 MED ORDER — PANTOPRAZOLE SODIUM 40 MG PO TBEC: 40.0000 mg | DELAYED_RELEASE_TABLET | Freq: Every day | ORAL | 3 refills | Status: DC

## 2020-12-29 NOTE — Telephone Encounter (Signed)
Inbound call from patient daughter states she received pantoprazole prescription for 1x a day. She would like clarification if she is to take 2x a day as it says in her after visit summary.

## 2020-12-29 NOTE — Progress Notes (Signed)
Chief Complaint: Abdominal pain  HPI:    Terri King is an 86 year old African-American female with a past medical history as listed below including severe aortic stenosis status post TAVR 08/30/2020 (09/29/2020 echo with an EF greater than 75% with a normally functioning TAVR), moderate pulmonary hypertension, dementia and osteoarthritis, referred by Dr. Alain Marion, who presents to clinic today with a complaint of diffuse abdominal pain.    08/25/2020 CT of the angio abdomen pelvis with severe thickening calcification of the aortic valve compatible with reported clinic history of severe aortic stenosis.  Cholelithiasis without evidence of acute cholecystitis and colonic diverticulosis without evidence of acute diverticulitis.  Also extensive edema in the presacral space and perirectal soft tissues of uncertain significance but recommended correlation for signs/symptoms of proctitis.    09/08/2020 CMP normal.    09/30/2020 CBC within normal hemoglobin.  BMP with a glucose minimally elevated at 105.    12/27/2020 patient seen by her PCP and they discussed dementia.  Planning to move to assisted living at some point.  She was restarted on Seroquel.  CMP was essentially normal.    Today, history is a little hard to garner as the patient does have dementia and is very vague in her descriptions.  Her granddaughter is with her who does try to help.  Apparently she has been experiencing some generalized abdominal discomfort which is off and on throughout the day for the last few months.  Apparently was diagnosed with a UTI at some point and does not feel like the symptoms are gone telling me that she will sometimes urinates "in my sleep".  She has not followed up about this yet.  Describes a diffuse abdominal pain but this does seem to be more in her upper abdomen.  Has also been having issues with nausea and tells me she has some reflux regardless of Famotidine 20 mg twice a day.  She is currently using Zofran 4 mg  as needed for nausea which does help.  Her bowel habits are completely normal.    Granddaughter also recounts a situation this morning when after she gave her her pills she had felt like they got stuck.  Patient tells me this occurs once in a while and she just has to wait for them to go down.    Granddaughter asked questions about a CT she had which showed some gallstones and wonders if there is anything needed to be done about this.    Denies fever, chills, weight loss, blood in her stool, change in bowel habits or symptoms that awaken her from sleep.  Past Medical History:  Diagnosis Date   Family history of anesthesia complication    NIENCE had sensitivity   History of pneumonia 2008   HTN (hypertension)    dr plotnikov   Hyperlipemia    Osteoarthritis    Severe aortic stenosis     Past Surgical History:  Procedure Laterality Date   JOINT REPLACEMENT  12/13   L TKR    KNEE ARTHROSCOPY     left   PARTIAL HYSTERECTOMY     RIGHT HEART CATH AND CORONARY ANGIOGRAPHY N/A 08/24/2020   Procedure: RIGHT HEART CATH AND CORONARY ANGIOGRAPHY;  Surgeon: Leonie Man, MD;  Location: [facility];  Service: Cardiovascular;  Laterality: N/A;   TEE WITHOUT CARDIOVERSION N/A 08/30/2020   Procedure: TRANSESOPHAGEAL ECHOCARDIOGRAM (TEE);  Surgeon: Burnell Blanks, MD;  Location: [place] [facility];  Service: Open Heart Surgery;  Laterality: N/A;  TONSILLECTOMY     TOTAL KNEE ARTHROPLASTY  01/23/2012   Procedure: TOTAL KNEE ARTHROPLASTY;  Surgeon: Ninetta Lights, MD;  Location: [place];  Service: Orthopedics;  Laterality: Left;   TOTAL KNEE ARTHROPLASTY  12/'05/2011   left knee   TRANSCATHETER AORTIC VALVE REPLACEMENT, TRANSFEMORAL N/A 08/30/2020   Procedure: TRANSCATHETER AORTIC VALVE REPLACEMENT, TRANSFEMORAL;  Surgeon: Burnell Blanks, MD;  Location: [place] [facility];  Service: Open Heart Surgery;  Laterality: N/A;    Current Outpatient Medications  Medication Sig  Dispense Refill   acetaminophen (TYLENOL) 325 MG tablet Take 650 mg by mouth every 6 (six) hours as needed.     Alcohol Swabs (B-D SINGLE USE SWABS REGULAR) PADS 1 each by Does not apply route as directed. Dx: R73.9 180 each 3   amLODipine (NORVASC) 10 MG tablet Take 1 tablet (10 mg total) by mouth daily. 90 tablet 3   amoxicillin (AMOXIL) 500 MG tablet Take FOUR TABLETS (2000 mg) by mouth ONE HOUR before any dental procedures (Patient not taking: Reported on 12/27/2020) 12 tablet 2   aspirin EC 81 MG tablet Take 81 mg by mouth daily. Swallow whole.     Blood Glucose Calibration (TRUE METRIX LEVEL 1) Low SOLN 1 each by In Vitro route as directed. Dx: R73.9 (Patient not taking: Reported on 12/27/2020) 3 each 3   Blood Glucose Monitoring Suppl (TRUE METRIX METER) w/Device KIT USE AS DIRECTED (Patient not taking: Reported on 12/27/2020) 1 kit 0   Cholecalciferol (VITAMIN D3) 50 MCG (2000 UT) capsule Take 1 capsule (2,000 Units total) by mouth daily. 100 capsule 3   clopidogrel (PLAVIX) 75 MG tablet Take 1 tablet (75 mg total) by mouth daily with breakfast. 90 tablet 3   escitalopram (LEXAPRO) 10 MG tablet TAKE 1 TABLET(10 MG) BY MOUTH DAILY 30 tablet 5   famotidine (PEPCID) 20 MG tablet Take 1 tablet (20 mg total) by mouth 2 (two) times daily. 180 tablet 3   gabapentin (NEURONTIN) 100 MG capsule Take 100 mg by mouth 3 (three) times daily.     glucose blood (TRUE METRIX BLOOD GLUCOSE TEST) test strip Use to check blood sugars twice a day (Patient not taking: Reported on 12/27/2020) 180 each 3   hydrALAZINE (APRESOLINE) 25 MG tablet Take 1 tablet (25 mg total) by mouth 3 (three) times daily. 270 tablet 3   Krill Oil (MAXIMUM RED KRILL) 300 MG CAPS 1 po qd 100 capsule 3   loratadine (CLARITIN) 10 MG tablet Take 1 tablet (10 mg total) by mouth daily as needed for allergies. 100 tablet 1   metoprolol succinate (TOPROL-XL) 50 MG 24 hr tablet TAKE 1 TABLET(50 MG) BY MOUTH AT BEDTIME WITH OR IMMEDIATELY FOLLOWING  A MEAL AS DIRECTED 30 tablet 11   nitroGLYCERIN (NITROSTAT) 0.4 MG SL tablet Place 1 tablet (0.4 mg total) under the tongue every 5 (five) minutes as needed for chest pain. (Patient not taking: No sig reported) 25 tablet 6   ondansetron (ZOFRAN) 4 MG tablet Take 1 tablet (4 mg total) by mouth every 8 (eight) hours as needed for nausea or vomiting. 63 tablet 0   potassium chloride SA (KLOR-CON) 20 MEQ tablet Take 1 tablet (20 mEq total) by mouth daily. 30 tablet 3   QUEtiapine (SEROQUEL) 25 MG tablet Take 1 tablet (25 mg total) by mouth at bedtime. 90 tablet 1   REPATHA SURECLICK 765 MG/ML SOAJ INJECT 1 PEN INTO THE SKIN EVERY 14 DAYS (Patient not taking: Reported on 12/27/2020) 6  mL 3   temazepam (RESTORIL) 30 MG capsule TAKE 1 CAPSULE AT BEDTIME AS NEEDED  FOR  SLEEP 90 capsule 1   TRUEplus Lancets 33G MISC Use to check blood sugars twice a day (Patient not taking: Reported on 12/27/2020) 180 each 3   vitamin B-12 (CYANOCOBALAMIN) 100 MCG tablet Take 100 mcg by mouth daily.     No current facility-administered medications for this visit.    Allergies as of 12/29/2020 - Review Complete 12/27/2020  Allergen Reaction Noted   Codeine Other (See Comments) 01/15/2012   Crestor [rosuvastatin]  12/03/2012   Dexilant [dexlansoprazole]  12/03/2012   Ibuprofen Nausea And Vomiting 04/23/2008   Simvastatin  02/17/2009   Iodine Swelling and Rash 03/17/2007    Family History  Problem Relation Age of Onset   Glaucoma Father    Hypertension Mother    Hypertension Other     Social History   Socioeconomic History   Marital status: Widowed    Spouse name: Not on file   Number of children: Not on file   Years of education: Not on file   Highest education level: Not on file  Occupational History   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)  Tobacco Use   Smoking status: Never   Smokeless tobacco: Never  Vaping Use   Vaping Use: Never used  Substance and Sexual Activity    Alcohol use: No   Drug use: No   Sexual activity: Never  Other Topics Concern   Not on file  Social History Narrative   Not on file   Social Determinants of Health   Financial Resource Strain: Low Risk    Difficulty of Paying Living Expenses: Not hard at all  Food Insecurity: No Food Insecurity   Worried About Charity fundraiser in the Last Year: Never true   [place] in the Last Year: Never true  Transportation Needs: No Transportation Needs   Lack of Transportation (Medical): No   Lack of Transportation (Non-Medical): No  Physical Activity: Insufficiently Active   Days of Exercise per Week: 3 days   Minutes of Exercise per Session: 20 min  Stress: No Stress Concern Present   Feeling of Stress : Not at all  Social Connections: Moderately Integrated   Frequency of Communication with Friends and Family: More than three times a week   Frequency of Social Gatherings with Friends and Family: Once a week   Attends Religious Services: 1 to 4 times per year   Active Member of [employer] or Organizations: No   Attends Archivist Meetings: 1 to 4 times per year   Marital Status: Widowed  Human resources officer Violence: Not At Risk   Fear of Current or Ex-Partner: No   Emotionally Abused: No   Physically Abused: No   Sexually Abused: No    Review of Systems:    Constitutional: No weight loss, fever or chills Skin: No rash  Cardiovascular: No chest pain  Respiratory: No SOB Gastrointestinal: See HPI and otherwise negative Genitourinary: No dysuria  Neurological: No headache, dizziness or syncope Musculoskeletal: No new muscle or joint pain Hematologic: No bleeding Psychiatric: No history of depression or anxiety   Physical Exam:  Vital signs: BP (!) 148/88   Pulse 66   Ht 5' 3" (1.6 m)   Wt 147 lb 6.4 oz (66.9 kg)   SpO2 99%   BMI 26.11 kg/m    Constitutional:   Pleasant Elderly AA female appears to be  in NAD, Well developed, Well nourished, alert and  cooperative Head:  Normocephalic and atraumatic. Eyes:   PEERL, EOMI. No icterus. Conjunctiva pink. Ears:  Normal auditory acuity. Neck:  Supple Throat: Oral cavity and pharynx without inflammation, swelling or lesion.  Respiratory: Respirations even and unlabored. Lungs clear to auscultation bilaterally.   No wheezes, crackles, or rhonchi.  Cardiovascular: Normal S1, S2. No MRG. Regular rate and rhythm. No peripheral edema, cyanosis or pallor.  Gastrointestinal:  Soft, nondistended, mild epigastric TTP no rebound or guarding. Normal bowel sounds. No appreciable masses or hepatomegaly. Rectal:  Not performed.  Msk:  Symmetrical without gross deformities. Without edema, no deformity or joint abnormality.  Ambulates with walker Neurologic:  Alert and  oriented x4;  grossly normal neurologically.  Skin:   Dry and intact without significant lesions or rashes. Psychiatric: Demonstrates good judgement and reason without abnormal affect or behaviors.  RELEVANT LABS AND IMAGING: CBC    Component Value Date/Time   WBC 7.6 12/01/2020 0901   RBC 3.96 12/01/2020 0901   HGB 13.4 12/01/2020 0901   HCT 38.6 12/01/2020 0901   PLT 257.0 12/01/2020 0901   MCV 97.4 12/01/2020 0901   MCH 32.4 09/01/2020 0114   MCHC 34.8 12/01/2020 0901   RDW 14.2 12/01/2020 0901   LYMPHSABS 1.2 12/01/2020 0901   MONOABS 0.8 12/01/2020 0901   EOSABS 0.1 12/01/2020 0901   BASOSABS 0.0 12/01/2020 0901    CMP     Component Value Date/Time   NA 139 12/27/2020 1613   K 4.1 12/27/2020 1613   CL 106 12/27/2020 1613   CO2 26 12/27/2020 1613   GLUCOSE 101 (H) 12/27/2020 1613   BUN 10 12/27/2020 1613   CREATININE 0.87 12/27/2020 1613   CREATININE 0.86 10/30/2019 1153   CALCIUM 9.8 12/27/2020 1613   PROT 6.7 12/27/2020 1613   ALBUMIN 4.0 12/27/2020 1613   AST 25 12/27/2020 1613   ALT 13 12/27/2020 1613   ALKPHOS 101 12/27/2020 1613   BILITOT 0.8 12/27/2020 1613   GFRNONAA >60 08/31/2020 0625   GFRNONAA 62  10/30/2019 1153   GFRAA 72 10/30/2019 1153    Assessment: 1.  Epigastric pain: With some reflux and nausea/vomiting, gallstones seen on imaging but liver enzymes normal; consider gastritis most likely 2.  GERD and nausea: With above 3.  Cholelithiasis: Seen at time of CTA, no right upper quadrant pain, liver enzymes normal 4.  Urinary incontinence: Continues per patient; consider ongoing UTI? 5. Dysphagia: Consider esophageal stricture versus dysmotility most likely  Plan: 1.  Discussed with patient I recommend she follow-up with her PCP in regards to continued urinary incontinence, it could be if she still has a UTI that some of this is leading to her generalized abdominal pain.  On exam and in description today feel like most of her pain is in her upper abdomen, so we will treat that. 2.  Stopped patient's Famotidine.  Started her on Pantoprazole 40 mg twice daily, 30-60 minutes before breakfast and dinner.  Prescribed #60 with 3 refills. 3.  Ordered a barium swallow with tablet as well as upper GI study for the patient given all of her upper symptoms and trouble swallowing.  Did discuss that she would be very high risk for any procedures given her recent TAVR and her overall health condition.  We will try to avoid these. 4.  Encouraged the patient to continue to use Zofran 4 mg as needed for nausea. 5.  Discussed gallstones.  Explained that at this  point in the patient's life unless they became an acute issue with acute cholecystitis or choledocholithiasis there is no need to remove her gallbladder. 6.  Patient to follow in clinic with me in 4 to 6 weeks.  Patient assigned to Dr. Havery Moros today.  Ellouise Newer, PA-C [facility] 12/29/2020, 11:13 AM  Cc: Cassandria Anger, MD

## 2020-12-29 NOTE — Patient Instructions (Signed)
We have sent the following medications to your pharmacy for you to pick up at your convenience: Pantoprazole 40 mg twice daily 30-60 minutes before breakfast and dinner.  You have been scheduled for a Barium Esophogram at [facility] (1st floor of the hospital) on Friday 01/13/21 at 9 am. Please arrive 15 minutes prior to your appointment for registration. Make certain not to have anything to eat or drink 3 hours prior to your test. If you need to reschedule for any reason, please contact radiology at [phone] to do so. __________________________________________________________________ A barium swallow is an examination that concentrates on views of the esophagus. This tends to be a double contrast exam (barium and two liquids which, when combined, create a gas to distend the wall of the oesophagus) or single contrast (non-ionic iodine based). The study is usually tailored to your symptoms so a good history is essential. Attention is paid during the study to the form, structure and configuration of the esophagus, looking for functional disorders (such as aspiration, dysphagia, achalasia, motility and reflux) EXAMINATION You may be asked to change into a gown, depending on the type of swallow being performed. A radiologist and radiographer will perform the procedure. The radiologist will advise you of the type of contrast selected for your procedure and direct you during the exam. You will be asked to stand, sit or lie in several different positions and to hold a small amount of fluid in your mouth before being asked to swallow while the imaging is performed .In some instances you may be asked to swallow barium coated marshmallows to assess the motility of a solid food bolus. The exam can be recorded as a digital or video fluoroscopy procedure. POST PROCEDURE It will take 1-2 days for the barium to pass through your system. To facilitate this, it is important, unless otherwise directed, to  increase your fluids for the next 24-48hrs and to resume your normal diet.  This test typically takes about 30 minutes to perform. ____________________________________________________________________  Terri King have been scheduled for an Upper GI Series at [facility]. Your appointment is on Friday 01/13/21 at 9 am. Please arrive 15 minutes prior to your test for registration. Make sure not to eat or drink anything after midnight on the night before your test. If you need to reschedule, please call radiology at [phone]. ________________________________________________________________ An upper GI series uses x rays to help diagnose problems of the upper GI tract, which includes the esophagus, stomach, and duodenum. The duodenum is the first part of the small intestine. An upper GI series is conducted by a radiology technologist or a radiologist--a doctor who specializes in x-ray imaging--at a hospital or outpatient center. While sitting or standing in front of an x-ray machine, the patient drinks barium liquid, which is often white and has a chalky consistency and taste. The barium liquid coats the lining of the upper GI tract and makes signs of disease show up more clearly on x rays. X-ray video, called fluoroscopy, is used to view the barium liquid moving through the esophagus, stomach, and duodenum. Additional x rays and fluoroscopy are performed while the patient lies on an x-ray table. To fully coat the upper GI tract with barium liquid, the technologist or radiologist may press on the abdomen or ask the patient to change position. Patients hold still in various positions, allowing the technologist or radiologist to take x rays of the upper GI tract at different angles. If a technologist conducts the upper GI series, a  radiologist will later examine the images to look for problems.  This test typically takes about 1 hour to  complete. __________________________________________________________________

## 2020-12-30 ENCOUNTER — Emergency Department (HOSPITAL_COMMUNITY): Payer: Medicare HMO

## 2020-12-30 ENCOUNTER — Telehealth: Payer: Self-pay | Admitting: Internal Medicine

## 2020-12-30 ENCOUNTER — Inpatient Hospital Stay (HOSPITAL_COMMUNITY)
Admission: EM | Admit: 2020-12-30 | Discharge: 2021-01-19 | DRG: 082 | Disposition: E | Payer: Medicare HMO | Attending: Pulmonary Disease | Admitting: Pulmonary Disease

## 2020-12-30 DIAGNOSIS — Z8249 Family history of ischemic heart disease and other diseases of the circulatory system: Secondary | ICD-10-CM

## 2020-12-30 DIAGNOSIS — J9601 Acute respiratory failure with hypoxia: Secondary | ICD-10-CM | POA: Diagnosis present

## 2020-12-30 DIAGNOSIS — I615 Nontraumatic intracerebral hemorrhage, intraventricular: Secondary | ICD-10-CM | POA: Diagnosis not present

## 2020-12-30 DIAGNOSIS — R739 Hyperglycemia, unspecified: Secondary | ICD-10-CM | POA: Diagnosis present

## 2020-12-30 DIAGNOSIS — S065XAA Traumatic subdural hemorrhage with loss of consciousness status unknown, initial encounter: Secondary | ICD-10-CM | POA: Diagnosis not present

## 2020-12-30 DIAGNOSIS — Z952 Presence of prosthetic heart valve: Secondary | ICD-10-CM | POA: Diagnosis not present

## 2020-12-30 DIAGNOSIS — Z20822 Contact with and (suspected) exposure to covid-19: Secondary | ICD-10-CM | POA: Diagnosis present

## 2020-12-30 DIAGNOSIS — I10 Essential (primary) hypertension: Secondary | ICD-10-CM | POA: Diagnosis present

## 2020-12-30 DIAGNOSIS — M25562 Pain in left knee: Secondary | ICD-10-CM | POA: Diagnosis not present

## 2020-12-30 DIAGNOSIS — E785 Hyperlipidemia, unspecified: Secondary | ICD-10-CM | POA: Diagnosis present

## 2020-12-30 DIAGNOSIS — Y92008 Other place in unspecified non-institutional (private) residence as the place of occurrence of the external cause: Secondary | ICD-10-CM

## 2020-12-30 DIAGNOSIS — R4020 Unspecified coma: Secondary | ICD-10-CM | POA: Diagnosis not present

## 2020-12-30 DIAGNOSIS — R29735 NIHSS score 35: Secondary | ICD-10-CM | POA: Diagnosis present

## 2020-12-30 DIAGNOSIS — Z66 Do not resuscitate: Secondary | ICD-10-CM | POA: Diagnosis present

## 2020-12-30 DIAGNOSIS — Z79899 Other long term (current) drug therapy: Secondary | ICD-10-CM | POA: Diagnosis not present

## 2020-12-30 DIAGNOSIS — R402433 Glasgow coma scale score 3-8, at hospital admission: Secondary | ICD-10-CM

## 2020-12-30 DIAGNOSIS — G9389 Other specified disorders of brain: Secondary | ICD-10-CM

## 2020-12-30 DIAGNOSIS — Z8673 Personal history of transient ischemic attack (TIA), and cerebral infarction without residual deficits: Secondary | ICD-10-CM | POA: Diagnosis not present

## 2020-12-30 DIAGNOSIS — I161 Hypertensive emergency: Secondary | ICD-10-CM | POA: Diagnosis not present

## 2020-12-30 DIAGNOSIS — I350 Nonrheumatic aortic (valve) stenosis: Secondary | ICD-10-CM | POA: Diagnosis present

## 2020-12-30 DIAGNOSIS — K219 Gastro-esophageal reflux disease without esophagitis: Secondary | ICD-10-CM | POA: Diagnosis present

## 2020-12-30 DIAGNOSIS — G935 Compression of brain: Secondary | ICD-10-CM

## 2020-12-30 DIAGNOSIS — F0390 Unspecified dementia without behavioral disturbance: Secondary | ICD-10-CM

## 2020-12-30 DIAGNOSIS — Z90711 Acquired absence of uterus with remaining cervical stump: Secondary | ICD-10-CM | POA: Diagnosis not present

## 2020-12-30 DIAGNOSIS — E876 Hypokalemia: Secondary | ICD-10-CM | POA: Diagnosis present

## 2020-12-30 DIAGNOSIS — R29818 Other symptoms and signs involving the nervous system: Secondary | ICD-10-CM | POA: Diagnosis not present

## 2020-12-30 DIAGNOSIS — R410 Disorientation, unspecified: Secondary | ICD-10-CM | POA: Diagnosis not present

## 2020-12-30 DIAGNOSIS — Z4682 Encounter for fitting and adjustment of non-vascular catheter: Secondary | ICD-10-CM | POA: Diagnosis not present

## 2020-12-30 DIAGNOSIS — Z7902 Long term (current) use of antithrombotics/antiplatelets: Secondary | ICD-10-CM | POA: Diagnosis not present

## 2020-12-30 DIAGNOSIS — S062X0S Diffuse traumatic brain injury without loss of consciousness, sequela: Secondary | ICD-10-CM

## 2020-12-30 DIAGNOSIS — Z96652 Presence of left artificial knee joint: Secondary | ICD-10-CM | POA: Diagnosis present

## 2020-12-30 DIAGNOSIS — D72828 Other elevated white blood cell count: Secondary | ICD-10-CM | POA: Diagnosis present

## 2020-12-30 DIAGNOSIS — G936 Cerebral edema: Secondary | ICD-10-CM | POA: Diagnosis present

## 2020-12-30 DIAGNOSIS — Z7982 Long term (current) use of aspirin: Secondary | ICD-10-CM

## 2020-12-30 DIAGNOSIS — S06A0XA Traumatic brain compression without herniation, initial encounter: Secondary | ICD-10-CM

## 2020-12-30 DIAGNOSIS — I629 Nontraumatic intracranial hemorrhage, unspecified: Principal | ICD-10-CM

## 2020-12-30 DIAGNOSIS — R296 Repeated falls: Secondary | ICD-10-CM | POA: Diagnosis present

## 2020-12-30 DIAGNOSIS — W010XXA Fall on same level from slipping, tripping and stumbling without subsequent striking against object, initial encounter: Secondary | ICD-10-CM | POA: Diagnosis present

## 2020-12-30 DIAGNOSIS — Z515 Encounter for palliative care: Secondary | ICD-10-CM | POA: Diagnosis not present

## 2020-12-30 DIAGNOSIS — Z7189 Other specified counseling: Secondary | ICD-10-CM | POA: Diagnosis not present

## 2020-12-30 DIAGNOSIS — R404 Transient alteration of awareness: Secondary | ICD-10-CM | POA: Diagnosis not present

## 2020-12-30 DIAGNOSIS — R42 Dizziness and giddiness: Secondary | ICD-10-CM | POA: Diagnosis not present

## 2020-12-30 DIAGNOSIS — M25561 Pain in right knee: Secondary | ICD-10-CM | POA: Diagnosis not present

## 2020-12-30 LAB — APTT: aPTT: 29 seconds (ref 24–36)

## 2020-12-30 LAB — COMPREHENSIVE METABOLIC PANEL
ALT: 21 U/L (ref 0–44)
ALT: 17 U/L (ref 0–44)
AST: 28 U/L (ref 15–41)
AST: 35 U/L (ref 15–41)
Albumin: 3.6 g/dL (ref 3.5–5.0)
Albumin: 3.6 g/dL (ref 3.5–5.0)
Alkaline Phosphatase: 106 U/L (ref 38–126)
Alkaline Phosphatase: 108 U/L (ref 38–126)
Anion gap: 8 (ref 5–15)
Anion gap: 14 (ref 5–15)
BUN: 6 mg/dL — ABNORMAL LOW (ref 8–23)
BUN: 6 mg/dL — ABNORMAL LOW (ref 8–23)
CO2: 25 mmol/L (ref 22–32)
CO2: 19 mmol/L — ABNORMAL LOW (ref 22–32)
Calcium: 9.8 mg/dL (ref 8.9–10.3)
Calcium: 9.4 mg/dL (ref 8.9–10.3)
Chloride: 105 mmol/L (ref 98–111)
Chloride: 104 mmol/L (ref 98–111)
Creatinine, Ser: 0.68 mg/dL (ref 0.44–1.00)
Creatinine, Ser: 0.69 mg/dL (ref 0.44–1.00)
GFR, Estimated: >60 mL/min (ref >60)
GFR, Estimated: >60 mL/min (ref >60)
Glucose, Bld: 101 mg/dL — ABNORMAL HIGH (ref 70–99)
Glucose, Bld: 201 mg/dL — ABNORMAL HIGH (ref 70–99)
Potassium: 3.4 mmol/L — ABNORMAL LOW (ref 3.5–5.1)
Potassium: 2.8 mmol/L — ABNORMAL LOW (ref 3.5–5.1)
Sodium: 138 mmol/L (ref 135–145)
Sodium: 137 mmol/L (ref 135–145)
Total Bilirubin: 0.8 mg/dL (ref 0.3–1.2)
Total Bilirubin: 1 mg/dL (ref 0.3–1.2)
Total Protein: 6.8 g/dL (ref 6.5–8.1)
Total Protein: 6.6 g/dL (ref 6.5–8.1)

## 2020-12-30 LAB — I-STAT CHEM 8, ED
BUN: 6 mg/dL — ABNORMAL LOW (ref 8–23)
Calcium, Ion: 1.23 mmol/L (ref 1.15–1.40)
Chloride: 103 mmol/L (ref 98–111)
Creatinine, Ser: 0.6 mg/dL (ref 0.44–1.00)
Glucose, Bld: 202 mg/dL — ABNORMAL HIGH (ref 70–99)
HCT: 40 % (ref 36.0–46.0)
Hemoglobin: 13.6 g/dL (ref 12.0–15.0)
Potassium: 2.8 mmol/L — ABNORMAL LOW (ref 3.5–5.1)
Sodium: 140 mmol/L (ref 135–145)
TCO2: 22 mmol/L (ref 22–32)

## 2020-12-30 LAB — I-STAT ARTERIAL BLOOD GAS, ED
Acid-base deficit: 4 mmol/L — ABNORMAL HIGH (ref 0.0–2.0)
Bicarbonate: 20 mmol/L (ref 20.0–28.0)
Calcium, Ion: 1.18 mmol/L (ref 1.15–1.40)
HCT: 35 % — ABNORMAL LOW (ref 36.0–46.0)
Hemoglobin: 11.9 g/dL — ABNORMAL LOW (ref 12.0–15.0)
O2 Saturation: 99 %
Patient temperature: 98.6
Potassium: 2.7 mmol/L — CL (ref 3.5–5.1)
Sodium: 138 mmol/L (ref 135–145)
TCO2: 21 mmol/L — ABNORMAL LOW (ref 22–32)
pCO2 arterial: 32.5 mmHg (ref 32.0–48.0)
pH, Arterial: 7.397 (ref 7.350–7.450)
pO2, Arterial: 122 mmHg — ABNORMAL HIGH (ref 83.0–108.0)

## 2020-12-30 LAB — GLUCOSE, CAPILLARY: Glucose-Capillary: 221 mg/dL — ABNORMAL HIGH (ref 70–99)

## 2020-12-30 LAB — PROTIME-INR
INR: 1 (ref 0.8–1.2)
Prothrombin Time: 13.4 seconds (ref 11.4–15.2)

## 2020-12-30 LAB — CBC
HCT: 40 % (ref 36.0–46.0)
Hemoglobin: 13.3 g/dL (ref 12.0–15.0)
MCH: 33.2 pg (ref 26.0–34.0)
MCHC: 33.3 g/dL (ref 30.0–36.0)
MCV: 99.8 fL (ref 80.0–100.0)
Platelets: 265 10*3/uL (ref 150–400)
RBC: 4.01 MIL/uL (ref 3.87–5.11)
RDW: 12.6 % (ref 11.5–15.5)
WBC: 18.2 10*3/uL — ABNORMAL HIGH (ref 4.0–10.5)
nRBC: 0 % (ref 0.0–0.2)

## 2020-12-30 LAB — CBC WITH DIFFERENTIAL/PLATELET
Abs Immature Granulocytes: 0.04 10*3/uL (ref 0.00–0.07)
Basophils Absolute: 0 10*3/uL (ref 0.0–0.1)
Basophils Relative: 0 %
Eosinophils Absolute: 0.2 10*3/uL (ref 0.0–0.5)
Eosinophils Relative: 3 %
HCT: 39.9 % (ref 36.0–46.0)
Hemoglobin: 13.6 g/dL (ref 12.0–15.0)
Immature Granulocytes: 1 %
Lymphocytes Relative: 15 %
Lymphs Abs: 1.2 10*3/uL (ref 0.7–4.0)
MCH: 32.9 pg (ref 26.0–34.0)
MCHC: 34.1 g/dL (ref 30.0–36.0)
MCV: 96.6 fL (ref 80.0–100.0)
Monocytes Absolute: 0.9 10*3/uL (ref 0.1–1.0)
Monocytes Relative: 12 %
Neutro Abs: 5.7 10*3/uL (ref 1.7–7.7)
Neutrophils Relative %: 69 %
Platelets: 269 10*3/uL (ref 150–400)
RBC: 4.13 MIL/uL (ref 3.87–5.11)
RDW: 12.4 % (ref 11.5–15.5)
WBC: 8.1 10*3/uL (ref 4.0–10.5)
nRBC: 0 % (ref 0.0–0.2)

## 2020-12-30 LAB — DIFFERENTIAL
Abs Immature Granulocytes: 0.17 10*3/uL — ABNORMAL HIGH (ref 0.00–0.07)
Basophils Absolute: 0.1 10*3/uL (ref 0.0–0.1)
Basophils Relative: 0 %
Eosinophils Absolute: 0 10*3/uL (ref 0.0–0.5)
Eosinophils Relative: 0 %
Immature Granulocytes: 1 %
Lymphocytes Relative: 11 %
Lymphs Abs: 2.1 10*3/uL (ref 0.7–4.0)
Monocytes Absolute: 1.2 10*3/uL — ABNORMAL HIGH (ref 0.1–1.0)
Monocytes Relative: 7 %
Neutro Abs: 14.7 10*3/uL — ABNORMAL HIGH (ref 1.7–7.7)
Neutrophils Relative %: 81 %

## 2020-12-30 LAB — CBG MONITORING, ED: Glucose-Capillary: 201 mg/dL — ABNORMAL HIGH (ref 70–99)

## 2020-12-30 LAB — RESP PANEL BY RT-PCR (FLU A&B, COVID) ARPGX2
Influenza A by PCR: NEGATIVE
Influenza B by PCR: NEGATIVE
SARS Coronavirus 2 by RT PCR: NEGATIVE

## 2020-12-30 MED ORDER — DOCUSATE SODIUM 100 MG PO CAPS: 100.0000 mg | ORAL_CAPSULE | Freq: Two times a day (BID) | ORAL | Status: DC | PRN

## 2020-12-30 MED ORDER — LABETALOL HCL 5 MG/ML IV SOLN
20.0000 mg | Freq: Once | INTRAVENOUS | Status: AC
Start: 2020-12-30 — End: 2020-12-30

## 2020-12-30 MED ORDER — PROPOFOL 1000 MG/100ML IV EMUL: 5.0000 ug/kg/min | INTRAVENOUS | Status: DC

## 2020-12-30 MED ORDER — ORAL CARE MOUTH RINSE
15.0000 mL | OROMUCOSAL | Status: DC
  Administered 2020-12-30 – 2020-12-31 (×9): 15 mL via OROMUCOSAL

## 2020-12-30 MED ORDER — PROPOFOL 1000 MG/100ML IV EMUL: 0.0000 ug/kg/min | INTRAVENOUS | Status: DC

## 2020-12-30 MED ORDER — CHLORHEXIDINE GLUCONATE CLOTH 2 % EX PADS: 6.0000 | MEDICATED_PAD | Freq: Every day | CUTANEOUS | Status: DC

## 2020-12-30 MED ORDER — CHLORHEXIDINE GLUCONATE 0.12% ORAL RINSE (MEDLINE KIT)
15.0000 mL | Freq: Two times a day (BID) | OROMUCOSAL | Status: DC
  Administered 2020-12-30 – 2020-12-31 (×2): 15 mL via OROMUCOSAL

## 2020-12-30 MED ORDER — CLEVIDIPINE BUTYRATE 0.5 MG/ML IV EMUL
0.0000 mg/h | INTRAVENOUS | Status: DC
  Administered 2020-12-31: 12 mg/h via INTRAVENOUS
  Administered 2020-12-31: 21 mg/h via INTRAVENOUS
  Filled 2020-12-30 (×5): qty 50

## 2020-12-30 MED ORDER — ETOMIDATE 2 MG/ML IV SOLN
20.0000 mg | Freq: Once | INTRAVENOUS | Status: AC
  Administered 2020-12-30: 20 mg via INTRAVENOUS

## 2020-12-30 MED ORDER — FAMOTIDINE IN NACL 20-0.9 MG/50ML-% IV SOLN
20.0000 mg | Freq: Two times a day (BID) | INTRAVENOUS | Status: DC
  Administered 2020-12-31: 20 mg via INTRAVENOUS
  Filled 2020-12-30 (×2): qty 50

## 2020-12-30 MED ORDER — FENTANYL CITRATE PF 50 MCG/ML IJ SOSY
25.0000 ug | PREFILLED_SYRINGE | INTRAMUSCULAR | Status: DC | PRN
  Administered 2020-12-30: 50 ug via INTRAVENOUS
  Filled 2020-12-30: qty 1

## 2020-12-30 MED ORDER — INSULIN ASPART 100 UNIT/ML IJ SOLN
0.0000 [IU] | INTRAMUSCULAR | Status: DC
  Administered 2020-12-31: 2 [IU] via SUBCUTANEOUS
  Administered 2020-12-31: 3 [IU] via SUBCUTANEOUS
  Administered 2020-12-31: 2 [IU] via SUBCUTANEOUS
  Administered 2020-12-31: 4 [IU] via SUBCUTANEOUS

## 2020-12-30 MED ORDER — SUCCINYLCHOLINE CHLORIDE 200 MG/10ML IV SOSY
70.0000 mg | PREFILLED_SYRINGE | Freq: Once | INTRAVENOUS | Status: AC
Start: 2020-12-30 — End: 2020-12-30
  Administered 2020-12-30: 70 mg via INTRAVENOUS

## 2020-12-30 MED ORDER — PANTOPRAZOLE SODIUM 40 MG PO TBEC: 40.0000 mg | DELAYED_RELEASE_TABLET | Freq: Two times a day (BID) | ORAL | 3 refills | Status: AC

## 2020-12-30 MED ORDER — POLYETHYLENE GLYCOL 3350 17 G PO PACK: 17.0000 g | PACK | Freq: Every day | ORAL | Status: DC | PRN

## 2020-12-30 MED ORDER — SODIUM CHLORIDE 0.9% FLUSH
3.0000 mL | Freq: Once | INTRAVENOUS | Status: AC
Start: 2020-12-30 — End: 2020-12-30
  Administered 2020-12-30: 3 mL via INTRAVENOUS

## 2020-12-30 MED ORDER — SODIUM CHLORIDE 0.9 % IV SOLN: INTRAVENOUS | Status: DC

## 2020-12-30 MED ORDER — POTASSIUM CHLORIDE 20 MEQ PO PACK
60.0000 meq | PACK | Freq: Once | ORAL | Status: AC
  Administered 2020-12-30: 60 meq
  Filled 2020-12-30: qty 3

## 2020-12-30 MED ORDER — CLEVIDIPINE BUTYRATE 0.5 MG/ML IV EMUL
INTRAVENOUS | Status: AC
  Administered 2020-12-30: 2 mg/h via INTRAVENOUS
  Filled 2020-12-30: qty 50

## 2020-12-30 MED ORDER — DOCUSATE SODIUM 50 MG/5ML PO LIQD: 100.0000 mg | Freq: Two times a day (BID) | ORAL | Status: DC | PRN

## 2020-12-30 MED ORDER — LABETALOL HCL 5 MG/ML IV SOLN
INTRAVENOUS | Status: AC
  Administered 2020-12-30: 20 mg via INTRAVENOUS
  Filled 2020-12-30: qty 4

## 2020-12-30 MED ORDER — PROPOFOL 1000 MG/100ML IV EMUL
INTRAVENOUS | Status: AC
  Administered 2020-12-30: 25 ug/kg/min via INTRAVENOUS
  Filled 2020-12-30: qty 100

## 2020-12-30 NOTE — ED Provider Notes (Signed)
Emergency Medicine Provider Triage Evaluation Note  Terri King , a 86 y.o. female  was evaluated in triage.  Pt complains of fall that occurred earlier today.  Per EMS home health nurse came to the door and found patient crawling on the floor with unknown downtime however patient reports that she was sitting down and heard somebody knock.  She asked who was at the door and stated that the nurse answered and said that she was here for home health.  Patient states that she got up and felt like she was rushing, lost balance, fell forward landing on her bilateral knees.  She currently complains of bilateral knee pain.  She denies any head injury or loss of consciousness.  Patient does have a history of dementia per chart review however is alert and oriented x4 currently.  She denies pain of any other sort.  She is noted to be hypertensive in the ED today at 235/55.  Review of Systems  Positive: + bilateral knee pain Negative: - head injury, LOC  Physical Exam  BP (!) 235/55 (BP Location: Right Arm)   Pulse 62   Temp 98.4 F (36.9 C) (Oral)   Resp 14   SpO2 100%  Gen:   Awake, no distress   Resp:  Normal effort  MSK:   Moves extremities without difficulty  Other:  A & O x 4. Following commands without difficulty. + bilateral knee TTP.   Medical Decision Making  Medically screening exam initiated at 2:41 PM.  Appropriate orders placed.  [name] was informed that the remainder of the evaluation will be completed by another provider, this initial triage assessment does not replace that evaluation, and the importance of remaining in the ED until their evaluation is complete.     Tanda Rockers, PA-C 01/17/2021 1442    Arby Barrette, MD 2021/01/26 [phone]

## 2020-12-30 NOTE — Consult Note (Addendum)
NEUROLOGY CONSULTATION NOTE   Date of service: December 30, 2020 Patient Name: Terri King MRN:  [id number] DOB:  August 10, 1934 Reason for consult: "Unresponsive with flaccid left upper and left lower extremity." Requesting Provider: Courtney Paris, * _ _ _   _ __   _ __ _ _  __ __   _ __   __ _  History of Present Illness  Terri King is a 86 y.o. female with PMH significant for hypertension, dementia, hyperlipidemia, osteoarthritis, severe aortic stenosis who presented with suspected fall with bilateral knee pain.  Patient's health aide went in to check on her today and noted the patient was crawling to the door to answer it.  It is unclear how long she has been on the floor.  Patient reported to the ED staff that she was dizzy prior to being on the floor.  She was also complaining of bilateral knee pain.  She reported to the ED team that she got up and felt like she was rushing, lost balance, fell forward landing on her bilateral knees. She was noted to be answering all questions to the ED triage staff.  She was placed in the waiting room.   She was brought back in to the triage room from waiting area with unresponsiveness to sternal rub, GCS of 3 and flaccid left upper extremity. She ws intubated with etomidate and succinyl-choline and a code stroke was activated with a last known well of 1420 on 01/01/2021.  CT head without contrast demonstrated a large hematoma in the right temporal lobe extending into the occipital parietal lobe which is concerning for a hemorrhagic infarction.  She also has a right-sided subdural hematoma which is about 13 mm at its thickest.  There is also a large intraventricular extension with dilated and trapped left lateral ventricle.  She has a 19 mm midline shift to the left on CT head.  mRS: 1 tNKase/Thrombectomy: not offered, she has ICH. ICH Score of 5. NIHSS components Score: Comment  1a Level of Conscious 0[]  1[]  2[]  3[x]      1b LOC Questions 0[]   1[]  2[x]       1c LOC Commands 0[]  1[]  2[x]       2 Best Gaze 0[]  1[]  2[x]       3 Visual 0[x]  1[]  2[]  3[]      4 Facial Palsy 0[]  1[]  2[]  3[x]      5a Motor Arm - left 0[]  1[]  2[]  3[]  4[x]  UN[]    5b Motor Arm - Right 0[]  1[]  2[]  3[]  4[x]  UN[]    6a Motor Leg - Left 0[]  1[]  2[]  3[]  4[x]  UN[]    6b Motor Leg - Right 0[]  1[]  2[]  3[]  4[x]  UN[]    7 Limb Ataxia 0[]  1[]  2[]  3[]  UN[x]     8 Sensory 0[]  1[]  2[x]  UN[]      9 Best Language 0[]  1[]  2[]  3[x]      10 Dysarthria 0[]  1[]  2[x]  UN[]      11 Extinct. and Inattention 0[x]  1[]  2[]       TOTAL: 35      ROS  Unable to obtain due to unresponsiveness. Past History   Past Medical History:  Diagnosis Date   Family history of anesthesia complication    NIENCE had sensitivity   History of pneumonia 2008   HTN (hypertension)    dr Alain Marion   Hyperlipemia    Osteoarthritis    Severe aortic stenosis    Past Surgical History:  Procedure Laterality Date   JOINT REPLACEMENT  12/13  L TKR    KNEE ARTHROSCOPY     left   PARTIAL HYSTERECTOMY     RIGHT HEART CATH AND CORONARY ANGIOGRAPHY N/A 08/24/2020   Procedure: RIGHT HEART CATH AND CORONARY ANGIOGRAPHY;  Surgeon: Marykay Lex, MD;  [facility]: [facility] INVASIVE CV LAB;  Service: Cardiovascular;  Laterality: N/A;   TEE WITHOUT CARDIOVERSION N/A 08/30/2020   Procedure: TRANSESOPHAGEAL ECHOCARDIOGRAM (TEE);  Surgeon: Kathleene Hazel, MD;  Location: [facility] INVASIVE CV LAB;  Service: Open Heart Surgery;  Laterality: N/A;   TONSILLECTOMY     TOTAL KNEE ARTHROPLASTY  01/23/2012   Procedure: TOTAL KNEE ARTHROPLASTY;  Surgeon: Loreta Ave, MD;  Location: [facility] OR;  Service: Orthopedics;  Laterality: Left;   TOTAL KNEE ARTHROPLASTY  12/'05/2011   left knee   TRANSCATHETER AORTIC VALVE REPLACEMENT, TRANSFEMORAL N/A 08/30/2020   Procedure: TRANSCATHETER AORTIC VALVE REPLACEMENT, TRANSFEMORAL;  Surgeon: Kathleene Hazel, MD;  Location: [facility] INVASIVE CV LAB;  Service: Open Heart Surgery;  Laterality: N/A;    Family History  Problem Relation Age of Onset   Glaucoma Father    Hypertension Mother    Hypertension Other    Social History   Socioeconomic History   Marital status: Widowed    Spouse name: Not on file   Number of children: Not on file   Years of education: Not on file   Highest education level: Not on file  Occupational History   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, industrial/product)  Tobacco Use   Smoking status: Never   Smokeless tobacco: Never  Vaping Use   Vaping Use: Never used  Substance and Sexual Activity   Alcohol use: No   Drug use: No   Sexual activity: Never  Other Topics Concern   Not on file  Social History Narrative   Not on file   Social Determinants of Health   Financial Resource Strain: Low Risk    Difficulty of Paying Living Expenses: Not hard at all  Food Insecurity: No Food Insecurity   Worried About Programme researcher, broadcasting/film/video in the Last Year: Never true   Ran Out of Food in the Last Year: Never true  Transportation Needs: No Transportation Needs   Lack of Transportation (Medical): No   Lack of Transportation (Non-Medical): No  Physical Activity: Insufficiently Active   Days of Exercise per Week: 3 days   Minutes of Exercise per Session: 20 min  Stress: No Stress Concern Present   Feeling of Stress : Not at all  Social Connections: Moderately Integrated   Frequency of Communication with Friends and Family: More than three times a week   Frequency of Social Gatherings with Friends and Family: Once a week   Attends Religious Services: 1 to 4 times per year   Active Member of [employer] or Organizations: No   Attends Banker Meetings: 1 to 4 times per year   Marital Status: Widowed   Allergies  Allergen Reactions   Codeine Other (See Comments)    Abnormal behavior   Crestor [Rosuvastatin]     Myalgias    Dexilant [Dexlansoprazole]     Made me sick   Ibuprofen Nausea And Vomiting   Simvastatin     REACTION: achy    Iodine Swelling and Rash    Medications  (Not in a hospital admission)    Vitals   Vitals:   12/25/2020 1426 01/04/2021 1657 01/17/2021 1951 12/22/2020 2018  BP: (!) 235/55 (!) 198/72 (!) 219/78   Pulse: 62  71 91   Resp: 14 15 12    Temp: 98.4 F (36.9 C) (!) 97.4 F (36.3 C)    TempSrc: Oral     SpO2: 100% 98% 100%   Height:    5\' 5"  (1.651 m)     Body mass index is 24.53 kg/m.  Physical Exam   General: intubated, does not appear in distress. HENT: Normal oropharynx and mucosa. Normal external appearance of ears and nose. Neck: Supple, no pain or tenderness CV: No JVD. No peripheral edema.  Pulmonary: Symmetric Chest rise. Not berathing over vent. Abdomen: Soft to touch, non-tender.  Ext: No cyanosis, edema, or deformity Skin: No rash. Normal palpation of skin.  Musculoskeletal: Normal digits and nails by inspection. No clubbing.  Neurologic Examination  Mental status/Cognition:  No response to voice or loud clap.  No grimace to nares stimulation.  Brainstem reflexes: Pupils: Both pupils are round.  Right pupil is about 4 mm in diameter and left pupil is about 3 mm in diameter.  Pupils appear somewhat cloudy.  No response to bright light in either of her pupils. Corneals: Absent bilaterally. Cough: Absent. Gag: Absent.  Motor/sensory: No spontaneous movement noted.  No grimace or movement noted to pinch in bilateral thighs or in bilateral armpits. Labs   CBC:  Recent Labs  Lab 12/28/2020 1442 01/08/2021 2011 01/12/2021 2021  WBC 8.1  --  18.2*  NEUTROABS 5.7  --  14.7*  HGB 13.6 13.6 13.3  HCT 39.9 40.0 40.0  MCV 96.6  --  99.8  PLT 269  --  99991111    Basic Metabolic Panel:  Lab Results  Component Value Date   NA 140 12/26/2020   K 2.8 (L) 01/14/2021   CO2 25 12/25/2020   GLUCOSE 202 (H) 01/15/2021   BUN 6 (L) 01/13/2021   CREATININE 0.60 01/01/2021   CALCIUM 9.8 01/01/2021   GFRNONAA >60 01/12/2021   GFRAA 72 10/30/2019   Lipid Panel:  Lab Results   Component Value Date   LDLCALC 83 08/23/2020   HgbA1c:  Lab Results  Component Value Date   HGBA1C 5.2 12/27/2020   Urine Drug Screen: No results found for: LABOPIA, COCAINSCRNUR, LABBENZ, AMPHETMU, THCU, LABBARB  Alcohol Level No results found for: [place]  CT Head without contrast: Personally reviewed CT head without contrast and demonstrates a large hematoma in the right temporal lobe extending to the occipital parietal lobe.  She also has a large intraventricular hematoma with dilated and trapped left lateral ventricle.  She has right-sided subdural hematoma which is about 13 mm in size.  She has about 2 cm midline shift.  Impression   Terri King is a 86 y.o. female who presents with a large IPH with intraventricular extension and a large R SDH. She was intubated in the ED for a GCS of 3. Her ICH score is 5.  I discussed this with her son at bedside and her other son on the phone. This is essentially an unsurvivable head bleed.  Impression: - Large Intracranial hemorrhage with intraventricular hemorrhage - Large R subdural hematoma - Cerebral edema with mass effect and midline shift. - Brain compression. - GCS of 3. - Hypertensive emergency. - Dementia.  Recommendations  - I spoke to both of her sons and discussed dire prognosis and significant disability going forward even if she was to make it through this with need for trach and Peg tube for feeding. Patient had expressed wishes in the past regarding preference for quality of life  over quantity of life and she would not have wanted to live like this. Family appropriately made patient DNR. They would like to continue her on Ventilator overnight until family can see her. ______________________________________________________________________  Plan discussed with Dr. Gustavus Messing with the ED team.   This patient is critically ill and at significant risk of neurological worsening, death and care requires constant monitoring of  vital signs, hemodynamics,respiratory and cardiac monitoring, neurological assessment, discussion with family, other specialists and medical decision making of high complexity. I spent 40 minutes of neurocritical care time  in the care of  this patient. This was time spent independent of any time provided by nurse practitioner or PA.  Donnetta Simpers [facility] Pager Number [phone] 01/15/2021  9:21 PM   Thank you for the opportunity to take part in the care of this patient. If you have any further questions, please contact the neurology consultation attending.  Signed,  [place] Pager Number [phone] _ _ _   _ __   _ __ _ _  __ __   _ __   __ _

## 2020-12-30 NOTE — Telephone Encounter (Signed)
Spoke with patient's daughter and confirmed patient should be taking Pantoprazole twice daily. Corrected prescription and sent it to the pharmacy.

## 2020-12-30 NOTE — ED Provider Notes (Signed)
[facility] EMERGENCY DEPARTMENT Provider Note   CSN: [id number] Arrival date & time: 01/14/2021  1417     History Chief Complaint  Patient presents with   Terri King is a 86 y.o. female.   Fall  Patient is an 86 year old female with a PMH significant for HTN, HLD, OA, s/p TAVR, CVA, and others as below who presents to the ED with complaints of fall.  Per review of MSE, patient was found by her home health care nurse this morning on the floor s/p apparent GLF.  She reportedly denied pain or falls in triage.  Per the patient's son at bedside, the patient was last known normal yesterday evening when he spoke with her on the phone.  He was unable to get a hold of her today and became worried.  On arrival, the patient was brought emergently back to the trauma bay for altered mental status and decreased respirations.  Patient is unresponsive and unable to answer further questions.  Patient's son states the patient recently started a new blood thinner, but on review of records, patient takes aspirin and Plavix and they are not new medications.  Unclear if there is a new blood thinner on board.  Patient's son also states she may have not been moving her right side normal yesterday, but is unclear when this started or how long this was going on.  Past Medical History:  Diagnosis Date   Family history of anesthesia complication    NIENCE had sensitivity   History of pneumonia 2008   HTN (hypertension)    dr Alain Marion   Hyperlipemia    Osteoarthritis    Severe aortic stenosis     Patient Active Problem List   Diagnosis Date Noted   Intracranial hemorrhage ([place]) 01/18/2021   SDH (subdural hematoma)    Hypertensive emergency    DNR (do not resuscitate)    Dementia ([place]) 12/27/2020   RUQ abdominal pain 11/30/2020   LLQ abdominal pain 11/30/2020   Nausea & vomiting 11/08/2020   Primary osteoarthritis involving multiple joints 10/27/2020   S/P TAVR  (transcatheter aortic valve replacement)    History of stroke 08/23/2020   Syncope 08/19/2020   Fracture of femoral condyle, closed ([place]) 08/19/2020   Severe aortic stenosis    Statin myopathy 04/08/2019   Well adult exam 08/07/2018   Hand weakness 08/07/2018   Gait disorder 08/07/2018   Headache 08/13/2017   Rash and nonspecific skin eruption 08/13/2017   Boil of buttock 05/09/2017   Shoulder pain, left 08/14/2016   Aortic stenosis, moderate 08/14/2016   Osteoporosis 11/01/2015   Cough 09/02/2015   Heart valve disease 06/14/2015   Insomnia 10/22/2013   UTI (urinary tract infection) 08/24/2013   Preop exam for internal medicine 01/16/2012   Ankle pain 07/11/2011   Adjustment disorder with mixed anxiety and depressed mood 03/28/2011   GERD (gastroesophageal reflux disease) 12/25/2010   Dysphagia 12/25/2010   Coronary atherosclerosis 02/09/2010   FEVER UNSPECIFIED 01/18/2010   CHEST PAIN 01/18/2010   Urinary incontinence 01/18/2010   BRONCHITIS NOT SPECIFIED AS ACUTE OR CHRONIC 12/05/2009   NEURALGIA, TRIGEMINAL 07/04/2009   EAR PAIN 07/04/2009   HYPOKALEMIA 02/17/2009   Arthralgia 02/17/2009   Hyperglycemia 09/02/2008   Osteoarthritis 03/11/2008   EDEMA 03/11/2008   KNEE PAIN 02/04/2008   CHEST XRAY, ABNORMAL 07/28/2007   Hyperlipidemia 03/17/2007   Essential hypertension 03/17/2007   UPPER RESPIRATORY INFECTION (URI) 03/17/2007   VAGINITIS 03/17/2007    Past  Surgical History:  Procedure Laterality Date   JOINT REPLACEMENT  12/13   L TKR    KNEE ARTHROSCOPY     left   PARTIAL HYSTERECTOMY     RIGHT HEART CATH AND CORONARY ANGIOGRAPHY N/A 08/24/2020   Procedure: RIGHT HEART CATH AND CORONARY ANGIOGRAPHY;  Surgeon: Leonie Man, MD;  Location: [place] [facility];  Service: Cardiovascular;  Laterality: N/A;   TEE WITHOUT CARDIOVERSION N/A 08/30/2020   Procedure: TRANSESOPHAGEAL ECHOCARDIOGRAM (TEE);  Surgeon: Burnell Blanks, MD;  Location: [place] [facility];  Service: Open Heart Surgery;  Laterality: N/A;   TONSILLECTOMY     TOTAL KNEE ARTHROPLASTY  01/23/2012   Procedure: TOTAL KNEE ARTHROPLASTY;  Surgeon: Ninetta Lights, MD;  Location: [place];  Service: Orthopedics;  Laterality: Left;   TOTAL KNEE ARTHROPLASTY  12/'05/2011   left knee   TRANSCATHETER AORTIC VALVE REPLACEMENT, TRANSFEMORAL N/A 08/30/2020   Procedure: TRANSCATHETER AORTIC VALVE REPLACEMENT, TRANSFEMORAL;  Surgeon: Burnell Blanks, MD;  Location: [facility] [facility];  Service: Open Heart Surgery;  Laterality: N/A;     OB History   No obstetric history on file.     Family History  Problem Relation Age of Onset   Glaucoma Father    Hypertension Mother    Hypertension Other     Social History   Tobacco Use   Smoking status: Never   Smokeless tobacco: Never  Vaping Use   Vaping Use: Never used  Substance Use Topics   Alcohol use: No   Drug use: No    Home Medications Prior to Admission medications   Medication Sig Start Date End Date Taking? Authorizing Provider  acetaminophen (TYLENOL) 325 MG tablet Take 650 mg by mouth every 6 (six) hours as needed for mild pain or headache.   Yes [provider]  amoxicillin (AMOXIL) 500 MG tablet Take FOUR TABLETS (2000 mg) by mouth ONE HOUR before any dental procedures Patient taking differently: Take 2,000 mg by mouth See admin instructions. 1 hour before any dental procedures 09/14/20  Yes Crista Luria  aspirin EC 81 MG tablet Take 81 mg by mouth daily. Swallow whole.   Yes [provider]  Cholecalciferol (VITAMIN D3) 50 MCG (2000 UT) capsule Take 1 capsule (2,000 Units total) by mouth daily. 11/08/20  Yes [name], Evie Lacks, MD  clopidogrel (PLAVIX) 75 MG tablet Take 1 tablet (75 mg total) by mouth daily with breakfast. 12/19/20 05/22/21 Yes Eileen Stanford, PA-C  escitalopram (LEXAPRO) 10 MG tablet TAKE 1 TABLET(10 MG) BY MOUTH DAILY Patient taking differently: Take 10 mg by  mouth daily. 12/11/20  Yes [name], Evie Lacks, MD  famotidine (PEPCID) 20 MG tablet Take 1 tablet (20 mg total) by mouth 2 (two) times daily. 12/09/20  Yes [name], Evie Lacks, MD  gabapentin (NEURONTIN) 100 MG capsule Take 100 mg by mouth 3 (three) times daily.   Yes [provider]  Astrid Drafts (MAXIMUM RED KRILL) 300 MG CAPS 1 po qd Patient taking differently: Take 300 mg by mouth daily. 07/22/13  Yes [name], Evie Lacks, MD  loratadine (CLARITIN) 10 MG tablet Take 1 tablet (10 mg total) by mouth daily as needed for allergies. 12/27/20  Yes [name], Evie Lacks, MD  metoprolol succinate (TOPROL-XL) 50 MG 24 hr tablet TAKE 1 TABLET(50 MG) BY MOUTH AT BEDTIME WITH OR IMMEDIATELY FOLLOWING A MEAL AS DIRECTED Patient taking differently: Take 50 mg by mouth daily. 12/11/20  Yes [name], Evie Lacks, MD  nitroGLYCERIN (  NITROSTAT) 0.4 MG SL tablet Place 1 tablet (0.4 mg total) under the tongue every 5 (five) minutes as needed for chest pain. 03/20/19  Yes Weaver, Scott T, PA-C  ondansetron (ZOFRAN) 4 MG tablet Take 1 tablet (4 mg total) by mouth every 8 (eight) hours as needed for nausea or vomiting. 12/05/20  Yes [name], Evie Lacks, MD  pantoprazole (PROTONIX) 40 MG tablet Take 1 tablet (40 mg total) by mouth 2 (two) times daily before a meal. 01/03/2021  Yes Lemmon, Lavone Nian, PA  potassium chloride SA (KLOR-CON) 20 MEQ tablet Take 1 tablet (20 mEq total) by mouth daily. 12/02/20  Yes [name], Evie Lacks, MD  vitamin B-12 (CYANOCOBALAMIN) 100 MCG tablet Take 100 mcg by mouth daily.   Yes [provider]  REPATHA SURECLICK 062 MG/ML SOAJ INJECT 1 PEN INTO THE SKIN EVERY 14 DAYS Patient not taking: Reported on 01/11/2021 03/15/20   Fay Records, MD  temazepam (RESTORIL) 30 MG capsule TAKE 1 CAPSULE AT BEDTIME AS NEEDED  FOR  SLEEP Patient taking differently: Take 30 mg by mouth at bedtime as needed for sleep. 11/08/20   [name], Evie Lacks, MD    Allergies    Codeine, Crestor  [rosuvastatin], Dexilant [dexlansoprazole], Ibuprofen, Simvastatin, Tositumomab, and Iodine  Review of Systems   Review of Systems  Unable to perform ROS: Patient unresponsive   Physical Exam Updated Vital Signs BP (!) 178/68 (BP Location: Left Arm)   Pulse 77   Temp (!) 97.4 F (36.3 C) (Oral) Comment: RN notified, blankets applied  Resp (!) 24   Ht 5' 5" (1.651 m)   SpO2 100%   BMI 24.53 kg/m   Physical Exam Vitals and nursing note reviewed.  Constitutional:      General: She is in acute distress.     Appearance: She is ill-appearing. She is not diaphoretic.     Comments: Unresponsive.  No obvious signs of trauma  HENT:     Head: Normocephalic and atraumatic. No right periorbital erythema or left periorbital erythema.     Jaw: There is normal jaw occlusion.     Right Ear: External ear normal.     Left Ear: External ear normal.     Nose: Nose normal.     Right Nostril: No epistaxis or septal hematoma.     Left Nostril: No epistaxis or septal hematoma.     Mouth/Throat:     Lips: Pink.     Dentition: Has dentures.     Pharynx: Uvula midline.     Comments: Blood in oropharynx Eyes:     Comments: Left pupil 54m, right 477mand non-reactive  Neck:     Vascular: No JVD.     Trachea: Trachea normal.  Cardiovascular:     Rate and Rhythm: Normal rate and regular rhythm.     Pulses: Normal pulses.     Heart sounds: Normal heart sounds. No murmur heard.    Comments: Warm and well perfused Pulmonary:     Effort: Respiratory distress present.     Comments: Snoring respirations, gurgling and not protecting her airway Chest:     Chest wall: No crepitus.  Abdominal:     General: There is no distension.     Palpations: Abdomen is soft.  Musculoskeletal:     Right lower leg: No edema.     Left lower leg: No edema.  Skin:    General: Skin is warm and dry.     Capillary Refill: Capillary refill takes 2 to  3 seconds.     Coloration: Skin is not pale.     Findings: No  bruising or erythema.  Neurological:     Mental Status: She is unresponsive.     GCS: GCS eye subscore is 1. GCS verbal subscore is 1. GCS motor subscore is 1.     Gait: Gait is intact.    ED Results / Procedures / Treatments   Labs (all labs ordered are listed, but only abnormal results are displayed) Labs Reviewed  COMPREHENSIVE METABOLIC PANEL - Abnormal; Notable for the following components:      Result Value   Potassium 3.4 (*)    Glucose, Bld 101 (*)    BUN 6 (*)    All other components within normal limits  CBC - Abnormal; Notable for the following components:   WBC 18.2 (*)    All other components within normal limits  DIFFERENTIAL - Abnormal; Notable for the following components:   Neutro Abs 14.7 (*)    Monocytes Absolute 1.2 (*)    Abs Immature Granulocytes 0.17 (*)    All other components within normal limits  COMPREHENSIVE METABOLIC PANEL - Abnormal; Notable for the following components:   Potassium 2.8 (*)    CO2 19 (*)    Glucose, Bld 201 (*)    BUN 6 (*)    All other components within normal limits  GLUCOSE, CAPILLARY - Abnormal; Notable for the following components:   Glucose-Capillary 221 (*)    All other components within normal limits  CBG MONITORING, ED - Abnormal; Notable for the following components:   Glucose-Capillary 201 (*)    All other components within normal limits  I-STAT CHEM 8, ED - Abnormal; Notable for the following components:   Potassium 2.8 (*)    BUN 6 (*)    Glucose, Bld 202 (*)    All other components within normal limits  I-STAT ARTERIAL BLOOD GAS, ED - Abnormal; Notable for the following components:   pO2, Arterial 122 (*)    TCO2 21 (*)    Acid-base deficit 4.0 (*)    Potassium 2.7 (*)    HCT 35.0 (*)    Hemoglobin 11.9 (*)    All other components within normal limits  RESP PANEL BY RT-PCR (FLU A&B, COVID) ARPGX2  MRSA NEXT GEN BY PCR, NASAL  CBC WITH DIFFERENTIAL/PLATELET  PROTIME-INR  APTT  TRIGLYCERIDES  BLOOD GAS,  ARTERIAL  BASIC METABOLIC PANEL  CK  I-STAT CHEM 8, ED  CBG MONITORING, ED    EKG None  Radiology DG Chest Portable 1 View  Result Date: 01/04/2021 CLINICAL DATA:  Tube placement. EXAM: PORTABLE CHEST 1 VIEW COMPARISON:  Chest x-ray 08/29/2020. FINDINGS: Endotracheal tube tip is approximately 4.6 cm above the carina. Enteric tube extends below the diaphragm. There is a new aortic stent/graft in place. The cardiomediastinal silhouette is within normal limits. The lungs and costophrenic angles are clear. There is no pneumothorax. No acute fractures are seen. There are severe degenerative changes of both shoulders. IMPRESSION: 1. Endotracheal tube tip 4.6 cm above the carina. 2. The lungs are clear. Electronically Signed   By: Ronney Asters M.D.   On: 01/03/2021 20:22   DG Knee Complete 4 Views Left  Result Date: 01/12/2021 CLINICAL DATA:  Fall, knee pain. EXAM: RIGHT KNEE - COMPLETE 4+ VIEW; LEFT KNEE - COMPLETE 4+ VIEW COMPARISON:  Left knee x-ray 08/19/2020. FINDINGS: Left knee: Left knee arthroplasty appears in anatomic alignment. There is no evidence for hardware loosening or  acute fracture. Soft tissues are within normal limits. The bones are diffusely osteopenic. Right knee: The bones are diffusely osteopenic. Large joint effusion is present. There is no definite acute fracture or dislocation. There is moderate severe medial compartment joint space narrowing and moderate severe patellofemoral compartment joint space narrowing. There is tricompartmental osteophyte formation and spurring of the tibial spines. IMPRESSION: 1. No definite acute fracture or dislocation of the bilateral knees. 2. Large joint effusion right knee. 3. Left knee arthroplasty in anatomic alignment. 4. Severe degenerative changes of the right knee. Electronically Signed   By: Ronney Asters M.D.   On: 01/10/2021 15:33   DG Knee Complete 4 Views Right  Result Date: 01/09/2021 CLINICAL DATA:  Fall, knee pain. EXAM: RIGHT  KNEE - COMPLETE 4+ VIEW; LEFT KNEE - COMPLETE 4+ VIEW COMPARISON:  Left knee x-ray 08/19/2020. FINDINGS: Left knee: Left knee arthroplasty appears in anatomic alignment. There is no evidence for hardware loosening or acute fracture. Soft tissues are within normal limits. The bones are diffusely osteopenic. Right knee: The bones are diffusely osteopenic. Large joint effusion is present. There is no definite acute fracture or dislocation. There is moderate severe medial compartment joint space narrowing and moderate severe patellofemoral compartment joint space narrowing. There is tricompartmental osteophyte formation and spurring of the tibial spines. IMPRESSION: 1. No definite acute fracture or dislocation of the bilateral knees. 2. Large joint effusion right knee. 3. Left knee arthroplasty in anatomic alignment. 4. Severe degenerative changes of the right knee. Electronically Signed   By: Ronney Asters M.D.   On: 01/14/2021 15:33   CT HEAD CODE STROKE WO CONTRAST  Result Date: 01/03/2021 CLINICAL DATA:  Code stroke.  Acute neuro deficit EXAM: CT HEAD WITHOUT CONTRAST TECHNIQUE: Contiguous axial images were obtained from the base of the skull through the vertex without intravenous contrast. COMPARISON:  08/19/2020 FINDINGS: Brain: Large intracranial hemorrhage. Large parenchymal hemorrhage in the right temporal and occipital parietal measuring approximately 9 x 5 cm. There is surrounding edema in the brain. This hemorrhage has extended into the right lateral ventricle where there is a large ventricular hematoma also extending into the left lateral ventricle. There is 19 mm midline shift to the left. Left ventricle is dilating appears trapped. There is some white matter edema surrounding the dilated left lateral ventricle. Right-sided high-density subdural hematoma measures 13 mm in thickness and also contributes to the midline shift. There is also subdural hemorrhage along the right tentorium and floor of the  middle cranial fossa Vascular: Negative for hyperdense vessel Skull: Negative Sinuses/Orbits: Negative Other: None ASPECTS ([place] Stroke Program Early CT Score) Not calculated due to intracranial hemorrhage. IMPRESSION: 1. Large hematoma in the right temporal lobe extending into the occipital parietal lobe. This may be hemorrhagic infarction. Patient is on aspirin and Plavix. There is a large intraventricular hematoma with dilated and trapped left lateral ventricle. 2. Right-sided subdural hematoma 13 mm 3. 19 mm midline shift to the left. Neuro surgical consultation recommended. 4. These results were called by telephone at the time of interpretation on 01/01/2021 at 8:36 pm to provider Lorrin Goodell , who verbally acknowledged these results. Electronically Signed   By: Franchot Gallo M.D.   On: 12/21/2020 20:37    Procedures Procedure Name: Intubation Date/Time: 12/27/2020 8:15 PM Performed by: Cherly Hensen, DO Pre-anesthesia Checklist: Patient identified, Emergency Drugs available, Suction available, Patient being monitored and Timeout performed Oxygen Delivery Method: Ambu bag Preoxygenation: Pre-oxygenation with 100% oxygen Induction Type: Rapid sequence Ventilation: Mask ventilation without  difficulty and Oral airway inserted - appropriate to patient size Laryngoscope Size: Mac and 4 Grade View: Grade I Tube size: 7.5 mm Number of attempts: 1 Airway Equipment and Method: Video-laryngoscopy Placement Confirmation: ETT inserted through vocal cords under direct vision, Positive ETCO2, CO2 detector and Breath sounds checked- equal and bilateral Secured at: 23 cm Tube secured with: ETT holder Dental Injury: Teeth and Oropharynx as per pre-operative assessment  Difficulty Due To: Difficult Airway- due to anterior larynx     Medications Ordered in ED Medications  clevidipine (CLEVIPREX) infusion 0.5 mg/mL (2 mg/hr Intravenous Rate/Dose Change 01/08/2021 2058)  polyethylene glycol (MIRALAX  / GLYCOLAX) packet 17 g (has no administration in time range)  famotidine (PEPCID) IVPB 20 mg premix (has no administration in time range)  0.9 %  sodium chloride infusion ( Intravenous New Bag/Given 12/24/2020 2311)  fentaNYL (SUBLIMAZE) injection 25-50 mcg (50 mcg Intravenous Given 01/03/2021 2310)  propofol (DIPRIVAN) 1000 MG/100ML infusion (0 mcg/kg/min  66.9 kg Intravenous Stopped 01/04/2021 2223)  docusate (COLACE) 50 MG/5ML liquid 100 mg (has no administration in time range)  chlorhexidine gluconate (MEDLINE KIT) (PERIDEX) 0.12 % solution 15 mL (15 mLs Mouth Rinse Given 01/13/2021 2301)  MEDLINE mouth rinse (15 mLs Mouth Rinse Given 12/21/2020 2302)  Chlorhexidine Gluconate Cloth 2 % PADS 6 each (has no administration in time range)  insulin aspart (novoLOG) injection 0-9 Units (has no administration in time range)  etomidate (AMIDATE) injection 20 mg (20 mg Intravenous Given 12/28/2020 2008)  succinylcholine (ANECTINE) syringe 70 mg (70 mg Intravenous Given 12/23/2020 2008)  sodium chloride flush (NS) 0.9 % injection 3 mL (3 mLs Intravenous Given 12/20/2020 2115)  labetalol (NORMODYNE) injection 20 mg (20 mg Intravenous Given 01/07/2021 2029)  potassium chloride (KLOR-CON) packet 60 mEq (60 mEq Per Tube Given 01/01/2021 2310)    ED Course  I have reviewed the triage vital signs and the nursing notes.  Pertinent labs & imaging results that were available during my care of the patient were reviewed by me and considered in my medical decision making (see chart for details).    MDM Rules/Calculators/A&P                          DERRIAN RODAK is a 86 y.o. female presenting with AMS/fall. Initial VS sig for hypertension. CODE STROKE called on arrival. BGL 201.  Patient somnolent and not protecting her airway on arrival.  EKG interpretation: Sinus arrhythmia with occasional PVCs, normal intervals with short PR, no ST elevations or depressions, repolarization abnormalities in inferior leads.  Labs:  Hypokalemia and hyperglycemia. CBC wnl. Other labs pending at the time of transfer from the ED.  Imaging: Postintubation x-ray with ETT 4.6 cm above the carina without other cardiopulmonary abnormality.  CT head remarkable for large right temporal lobe and occipital lobe hematoma and large intraventricular hematoma with right-sided SDH and 19 mm midline shift to the left. Imaging was reviewed by radiology and personally by me.  DDX considered: CVA, head trauma, meningitis, encephalopathy. History, examination, and objective data most consistent with catastrophic head bleed, likely hypertensive in nature given history, asa/plavix use, anisocoria, rapid decompensation, and HTN on arrival.  No signs of obvious trauma on exam.  No systemic infectious signs or symptoms.  No obvious electrolyte disturbances accounting for altered mental status.  Low suspicion for drug intoxication or withdrawal based on history.  Unable to complete neurologic exam on arrival, no reported focal deficits on initial exam in triage.  Medications: Medications  clevidipine (CLEVIPREX) infusion 0.5 mg/mL (2 mg/hr Intravenous Rate/Dose Change 01/10/2021 2058)  polyethylene glycol (MIRALAX / GLYCOLAX) packet 17 g (has no administration in time range)  famotidine (PEPCID) IVPB 20 mg premix (has no administration in time range)  0.9 %  sodium chloride infusion ( Intravenous New Bag/Given 12/26/2020 2311)  fentaNYL (SUBLIMAZE) injection 25-50 mcg (50 mcg Intravenous Given 01/04/2021 2310)  propofol (DIPRIVAN) 1000 MG/100ML infusion (0 mcg/kg/min  66.9 kg Intravenous Stopped 12/22/2020 2223)  docusate (COLACE) 50 MG/5ML liquid 100 mg (has no administration in time range)  chlorhexidine gluconate (MEDLINE KIT) (PERIDEX) 0.12 % solution 15 mL (15 mLs Mouth Rinse Given 01/17/2021 2301)  MEDLINE mouth rinse (15 mLs Mouth Rinse Given 01/12/2021 2302)  Chlorhexidine Gluconate Cloth 2 % PADS 6 each (has no administration in time range)  insulin  aspart (novoLOG) injection 0-9 Units (has no administration in time range)  etomidate (AMIDATE) injection 20 mg (20 mg Intravenous Given 01/15/2021 2008)  succinylcholine (ANECTINE) syringe 70 mg (70 mg Intravenous Given 12/24/2020 2008)  sodium chloride flush (NS) 0.9 % injection 3 mL (3 mLs Intravenous Given 12/25/2020 2115)  labetalol (NORMODYNE) injection 20 mg (20 mg Intravenous Given 12/25/2020 2029)  potassium chloride (KLOR-CON) packet 60 mEq (60 mEq Per Tube Given 01/05/2021 2310)    Patient found to be snoring and gurgling, not protecting her airway on arrival of provider to room.  Jaw thrust applied on arrival, vitals significant for HTN but otherwise wnl.  Afebrile and not hypoxic.  Intubated as above with etomidate and succinylcholine successfully. Post intubation CXR reveals ETT in expected position. Propofol initiated for sedation.  Patient taken emergently to CT which revealed findings as above.  Cleviprex infusion initiated.  Neurology at bedside shortly after intubation for exam.  Re-evaluated prior to admission. Hemodynamically stable and in no acute distress.  Admitted to ICU in critical condition.  Per neurology, this bleed likely represents an nonsurvivable condition.  See other provider's note regarding discussion at length with son at the bedside, patient will remain intubated until family is able to present to bedside, with plans for possible comfort care transition when family determines.  Family understands and agrees with the plan. Transferred from the ED without issue.    The plan for this patient was discussed with my attending physician, who voiced agreement and who oversaw evaluation and treatment of this patient.     Note: Estate manager/land agent was used in the creation of this note.  Final Clinical Impression(s) / ED Diagnoses Final diagnoses:  Intracranial bleed [facility])    Rx / DC Orders ED Discharge Orders     None        Cherly Hensen,  DO 01/22/21 0022    Tegeler, Gwenyth Allegra, MD 01-22-2021 [phone]

## 2020-12-30 NOTE — ED Triage Notes (Addendum)
Pt lives at home alone with hx of dementia, has home health aid who comes and checks on her. On health aid arrival, pt crawling to door to answer it. Unknown how long she was on the floor, but last thing she remembers prior to being on the floor is being dizzy. Pt c/o bilateral knee pain. On exam patient answering all questions appropriately and sts that she was in a hurry to answer the door and fell in route to the door. Was not on the floor for an extended period of time, as was EMS's indication.

## 2020-12-30 NOTE — ED Notes (Signed)
Pt brought back to triage room by sort tech, pt unresponsive to sternal rub, left arm flaccid. Charge RN made aware.

## 2020-12-30 NOTE — Telephone Encounter (Signed)
Pam, an RN with [facility] has called to report that pt has fallen. States that she is going to ER. Also, requesting order for re certification of services.    Callback #- [id number]

## 2020-12-30 NOTE — Progress Notes (Signed)
eLink Physician-Brief Progress Note Patient Name: ESTEEN DELPRIORE DOB: 07-25-1934 MRN: [id number]   Date of Service  12/23/2020  HPI/Events of Note  Patient with devastating ICH, coma, respiratory failure on the ventilator.  eICU Interventions  New Patient Evaluation. Plan is to transition to comfort measures.        Thomasene Lot Vianna Venezia 01/12/2021, 11:54 PM

## 2020-12-30 NOTE — H&P (Signed)
NAME:  Terri King, MRN:  [id number], DOB:  25-Sep-1934, LOS: 0 ADMISSION DATE:  01/09/2021, CONSULTATION DATE:  [name] MD:  Tegler MD, CHIEF COMPLAINT:  Fall   History of Present Illness:  Patient is encephalopathic and/or intubated. Therefore history has been obtained from chart review.   Terri King, is a 86 y.o. female, who presented to the [facility] ED with a chief complaint of a fall  They have a pertinent past medical history of dementia, HLD, OA, aortic stenosis S/P TAVR 08/2020, L occipital stroke  Family reports that she has had several falls this week. On 11/11 patients home health aide found her on the floor. Unclear downtime.   She was found to be hypertensive in the ED (235/55). Per documentation she was oriented x4 on presentation but later became unresponsive with a GCS of 3. She was intubated in the ED. A code stroke was called.    CT head demonstrated a large hematoma in the right temporal lobe extending into the occipital parietal lobe, concerning for hemorraghic infarction, a 30mm right SDH, IVH, and a 19 mm midline shift. ICH score 5. Neurology deemed injury as an unsurvivable head bleed.  She was made a DNR after discussion with sons Phineas Inches and Kandise Riehle with no escalation of care. Family would like to transition the patient to comfort care on 11/12 once family is available to be at bedside.  PCCM was consulted for admission to the ICU  Pertinent  Medical History  Dementia, HLD, OA, aortic stenosis S/P TAVR 08/2020, L occipital stroke,  Significant Hospital Events: Including procedures, antibiotic start and stop dates in addition to other pertinent events   11/11 Presented to ED, Intubated, neurology consulted  Interim History / Subjective:  See above  2mg  cleviprex propofol  Unable to obtain subjective evaluation due to patient status   Objective   Blood pressure (!) 137/46, pulse 77, temperature (!) 97.4 F (36.3 C), resp.  rate 16, height 5\' 5"  (1.651 m), SpO2 100 %.    Vent Mode: PRVC FiO2 (%):  [50 %] 50 % Set Rate:  [16 bmp] 16 bmp Vt Set:  [450 mL] 450 mL PEEP:  [5 cmH20] 5 cmH20 Plateau Pressure:  [13 cmH20] 13 cmH20  No intake or output data in the 24 hours ending 12/20/2020 2155 There were no vitals filed for this visit.  Examination: General: In bed, NAD, frail HEENT: MM pink/moist, anicteric, atraumatic Neuro: RASS -5, PERRL  RT 61mm, L 34mm, GCS 3t, Not breathing over vent, +corneal, cough, gag CV: S1S2, NSR, no m/r/g appreciated PULM:  clear in the upper lobes, clear in the lower lobes, trachea midline, chest expansion symmetric GI: soft, bsx4 active, non-tender   Extremities: warm/dry, no pretibial edema, capillary refill less than 3 seconds  Skin:  no rashes or lesions noted  CXR stable ETT, no pneumo, no infiltrate ABG: WNL Hypokalemic on istat CBC: leukocytosis 2ndary to head bleed  Resolved Hospital Problem list     Assessment & Plan:  ICH with IVH- ICH score 5 R 55mm SDH Cerebral edema Hypertensive emergency Fall Endotracheally intubated- for airway protection. Hypokalemia HX Dementia HX Aortic stenosis S/P TAVR 08/2020 DNR Multiple falls over the week. On ASA/Plavix. Head bleeds seen on head CT 11/11. Deemed nonsurvivable by Dr. 09/2020. Dr. 13/11 and I separately spoke with sons Derry Lory and Lakeia Bradshaw and updated them on findings of head CT and prognosis. Family expressed that "she had lived a good life"  and "helped a lot of people" and given the injuries that she has that she has that she would want to pursue comfort.  P: -Continue DNR, no escalation in care. No vasoactive agents.  -Neurology consulted and following. -Continue cleviprex. Goal SBP 130-150. -LTVV strategy with tidal volumes of 4-8 cc/kg ideal body weight -Goal plateau pressures less than 30 and driving pressures less than 15 -Wean PEEP/FiO2 for SpO2 92-98% -VAP bundle -PAD bundle with  Propofol gtt and fentanyl push. Wean propofol once in ICU. -Foley for comfort. -Replete potassium -Will plan to transition to comfort care once family arrives on 11/12. Plan to extubate and allow for a natural death. Discussed with Jules Husbands and Luisa Hart.    Best Practice (right click and "Reselect all SmartList Selections" daily)   Diet/type: NPO w/ meds via tube DVT prophylaxis: SCD GI prophylaxis: H2B Lines: N/A Foley:  Yes, and it is still needed Code Status:  DNR Last date of multidisciplinary goals of care discussion [11/11 See note]  Labs   CBC: Recent Labs  Lab 01/17/2021 1442 2021/01/17 2011 2021-01-17 2021  WBC 8.1  --  18.2*  NEUTROABS 5.7  --  14.7*  HGB 13.6 13.6 13.3  HCT 39.9 40.0 40.0  MCV 96.6  --  99.8  PLT 269  --  265    Basic Metabolic Panel: Recent Labs  Lab 12/27/20 1613 January 17, 2021 1442 Jan 17, 2021 2011 Jan 17, 2021 2021  NA 139 138 140 137  K 4.1 3.4* 2.8* 2.8*  CL 106 105 103 104  CO2 26 25  --  19*  GLUCOSE 101* 101* 202* 201*  BUN 10 6* 6* 6*  CREATININE 0.87 0.68 0.60 0.69  CALCIUM 9.8 9.8  --  9.4   GFR: Estimated Creatinine Clearance: 47.1 mL/min (by C-G formula based on SCr of 0.69 mg/dL). Recent Labs  Lab 01/17/21 1442 2021-01-17 2021  WBC 8.1 18.2*    Liver Function Tests: Recent Labs  Lab 12/27/20 1613 01/17/2021 1442 01/17/21 2021  AST 25 28 35  ALT 13 21 17   ALKPHOS 101 106 108  BILITOT 0.8 0.8 1.0  PROT 6.7 6.8 6.6  ALBUMIN 4.0 3.6 3.6   No results for input(s): LIPASE, AMYLASE in the last 168 hours. No results for input(s): AMMONIA in the last 168 hours.  ABG    Component Value Date/Time   PHART 7.481 (H) 08/29/2020 2255   PCO2ART 28.0 (L) 08/29/2020 2255   PO2ART 87.1 08/29/2020 2255   HCO3 20.7 08/29/2020 2255   TCO2 22 01/17/21 2011   ACIDBASEDEF 2.3 (H) 08/29/2020 2255   O2SAT 97.3 08/29/2020 2255     Coagulation Profile: Recent Labs  Lab 2021-01-17 2021  INR 1.0    Cardiac Enzymes: No results for input(s):  CKTOTAL, CKMB, CKMBINDEX, TROPONINI in the last 168 hours.  HbA1C: Hgb A1c MFr Bld  Date/Time Value Ref Range Status  12/27/2020 04:13 PM 5.2 4.6 - 6.5 % Final    Comment:    Glycemic Control Guidelines for People with Diabetes:Non Diabetic:  <6%Goal of Therapy: <7%Additional Action Suggested:  >8%   11/08/2020 11:03 AM 5.3 4.6 - 6.5 % Final    Comment:    Glycemic Control Guidelines for People with Diabetes:Non Diabetic:  <6%Goal of Therapy: <7%Additional Action Suggested:  >8%     CBG: Recent Labs  Lab Jan 17, 2021 1957  GLUCAP 201*    Review of Systems:   Unable to complete ROS due to patient status  Past Medical History:  She,  has a past  medical history of Family history of anesthesia complication, History of pneumonia (2008), HTN (hypertension), Hyperlipemia, Osteoarthritis, and Severe aortic stenosis.   Surgical History:   Past Surgical History:  Procedure Laterality Date   JOINT REPLACEMENT  12/13   L TKR    KNEE ARTHROSCOPY     left   PARTIAL HYSTERECTOMY     RIGHT HEART CATH AND CORONARY ANGIOGRAPHY N/A 08/24/2020   Procedure: RIGHT HEART CATH AND CORONARY ANGIOGRAPHY;  Surgeon: Marykay Lex, MD;  Location: [facility] INVASIVE CV LAB;  Service: Cardiovascular;  Laterality: N/A;   TEE WITHOUT CARDIOVERSION N/A 08/30/2020   Procedure: TRANSESOPHAGEAL ECHOCARDIOGRAM (TEE);  Surgeon: Kathleene Hazel, MD;  Location: [facility] INVASIVE CV LAB;  Service: Open Heart Surgery;  Laterality: N/A;   TONSILLECTOMY     TOTAL KNEE ARTHROPLASTY  01/23/2012   Procedure: TOTAL KNEE ARTHROPLASTY;  Surgeon: Loreta Ave, MD;  Location: [facility] OR;  Service: Orthopedics;  Laterality: Left;   TOTAL KNEE ARTHROPLASTY  12/'05/2011   left knee   TRANSCATHETER AORTIC VALVE REPLACEMENT, TRANSFEMORAL N/A 08/30/2020   Procedure: TRANSCATHETER AORTIC VALVE REPLACEMENT, TRANSFEMORAL;  Surgeon: Kathleene Hazel, MD;  Location: [facility] INVASIVE CV LAB;  Service: Open Heart Surgery;  Laterality: N/A;      Social History:   reports that she has never smoked. She has never used smokeless tobacco. She reports that she does not drink alcohol and does not use drugs.   Family History:  Her family history includes Glaucoma in her father; Hypertension in her mother and another family member.   Allergies Allergies  Allergen Reactions   Codeine Other (See Comments)    Abnormal behavior   Crestor [Rosuvastatin]     Myalgias    Dexilant [Dexlansoprazole]     Made me sick   Ibuprofen Nausea And Vomiting   Simvastatin     REACTION: achy   Tositumomab    Iodine Swelling and Rash     Home Medications  Prior to Admission medications   Medication Sig Start Date End Date Taking? Authorizing Provider  acetaminophen (TYLENOL) 325 MG tablet Take 650 mg by mouth every 6 (six) hours as needed for mild pain or headache.   Yes [provider]  amoxicillin (AMOXIL) 500 MG tablet Take FOUR TABLETS (2000 mg) by mouth ONE HOUR before any dental procedures Patient taking differently: Take 2,000 mg by mouth See admin instructions. 1 hour before any dental procedures 09/14/20  Yes Allena Katz  aspirin EC 81 MG tablet Take 81 mg by mouth daily. Swallow whole.   Yes [provider]  Cholecalciferol (VITAMIN D3) 50 MCG (2000 UT) capsule Take 1 capsule (2,000 Units total) by mouth daily. 11/08/20  Yes [name], Georgina Quint, MD  clopidogrel (PLAVIX) 75 MG tablet Take 1 tablet (75 mg total) by mouth daily with breakfast. 12/19/20 05/22/21 Yes Janetta Hora, PA-C  escitalopram (LEXAPRO) 10 MG tablet TAKE 1 TABLET(10 MG) BY MOUTH DAILY Patient taking differently: Take 10 mg by mouth daily. 12/11/20  Yes [name], Georgina Quint, MD  famotidine (PEPCID) 20 MG tablet Take 1 tablet (20 mg total) by mouth 2 (two) times daily. 12/09/20  Yes [name], Georgina Quint, MD  gabapentin (NEURONTIN) 100 MG capsule Take 100 mg by mouth 3 (three) times daily.   Yes [provider]  [facility] Lanius  (MAXIMUM RED KRILL) 300 MG CAPS 1 po qd Patient taking differently: Take 300 mg by mouth daily. 07/22/13  Yes [name], Georgina Quint, MD  loratadine (CLARITIN) 10  MG tablet Take 1 tablet (10 mg total) by mouth daily as needed for allergies. 12/27/20  Yes [name], Georgina Quint, MD  metoprolol succinate (TOPROL-XL) 50 MG 24 hr tablet TAKE 1 TABLET(50 MG) BY MOUTH AT BEDTIME WITH OR IMMEDIATELY FOLLOWING A MEAL AS DIRECTED Patient taking differently: Take 50 mg by mouth daily. 12/11/20  Yes [name], Georgina Quint, MD  nitroGLYCERIN (NITROSTAT) 0.4 MG SL tablet Place 1 tablet (0.4 mg total) under the tongue every 5 (five) minutes as needed for chest pain. 03/20/19  Yes Weaver, Scott T, PA-C  ondansetron (ZOFRAN) 4 MG tablet Take 1 tablet (4 mg total) by mouth every 8 (eight) hours as needed for nausea or vomiting. 12/05/20  Yes [name], Georgina Quint, MD  pantoprazole (PROTONIX) 40 MG tablet Take 1 tablet (40 mg total) by mouth 2 (two) times daily before a meal. 12/25/2020  Yes Lemmon, Violet Baldy, PA  potassium chloride SA (KLOR-CON) 20 MEQ tablet Take 1 tablet (20 mEq total) by mouth daily. 12/02/20  Yes [name], Georgina Quint, MD  vitamin B-12 (CYANOCOBALAMIN) 100 MCG tablet Take 100 mcg by mouth daily.   Yes [provider]  REPATHA SURECLICK 140 MG/ML SOAJ INJECT 1 PEN INTO THE SKIN EVERY 14 DAYS Patient not taking: Reported on 12/25/2020 03/15/20   Pricilla Riffle, MD  temazepam (RESTORIL) 30 MG capsule TAKE 1 CAPSULE AT BEDTIME AS NEEDED  FOR  SLEEP Patient taking differently: Take 30 mg by mouth at bedtime as needed for sleep. 11/08/20   [name], Georgina Quint, MD     Critical care time: 37 minutes    Gershon Mussel., MSN, APRN, AGACNP-BC [facility]  12/28/2020 , 9:56 PM  Please see [email or web address] for pager details  If no response, please call [phone] After hours, please call Elink at [phone]

## 2020-12-30 NOTE — ED Notes (Signed)
Pt arrived into Tra A, unresponsive, gcs 3, teeth clenched. Son at bedside reported pt has not been using L arm since finished triage. MD at bedside preparing for intubation.

## 2020-12-30 NOTE — Code Documentation (Signed)
Stroke Response Nurse Documentation Code Documentation  Terri King is a 86 y.o. female arriving to [facility] ED via Private Vehicle on 11/11 with past medical hx of HTN,HLD. On aspirin 81 mg daily and clopidogrel 75 mg daily. Code stroke was activated by ED.   Patient from home where she was LKW at 1420 and now complaining of left sided weakness.   Stroke team at the bedside on patient arrival. Labs drawn and patient cleared for CT by Dr. Julieanne Manson. Patient to CT with team. NIHSS 35, see documentation for details and code stroke times. Patient with decreased LOC, disoriented, not following commands, left facial droop, bilateral arm weakness, bilateral leg weakness, bilateral decreased sensation, Global aphasia , dysarthria , and Sensory  neglect on exam. The following imaging was completed:  CT. Patient is not a candidate for IV Thrombolytic due to hemorrhage.   Care/Plan: DNR/comfort care.   Bedside handoff with ED RN [place].    Rose Fillers  Rapid Response RN

## 2020-12-30 NOTE — Progress Notes (Signed)
Agree with assessment and plan as outlined.  

## 2020-12-31 DIAGNOSIS — J9601 Acute respiratory failure with hypoxia: Secondary | ICD-10-CM

## 2020-12-31 DIAGNOSIS — Z7189 Other specified counseling: Secondary | ICD-10-CM | POA: Diagnosis not present

## 2020-12-31 DIAGNOSIS — S06A0XA Traumatic brain compression without herniation, initial encounter: Secondary | ICD-10-CM

## 2020-12-31 DIAGNOSIS — I629 Nontraumatic intracranial hemorrhage, unspecified: Secondary | ICD-10-CM | POA: Diagnosis not present

## 2020-12-31 DIAGNOSIS — I615 Nontraumatic intracerebral hemorrhage, intraventricular: Secondary | ICD-10-CM

## 2020-12-31 LAB — GLUCOSE, CAPILLARY
Glucose-Capillary: 215 mg/dL — ABNORMAL HIGH (ref 70–99)
Glucose-Capillary: 187 mg/dL — ABNORMAL HIGH (ref 70–99)
Glucose-Capillary: 169 mg/dL — ABNORMAL HIGH (ref 70–99)

## 2020-12-31 LAB — BASIC METABOLIC PANEL
Anion gap: 16 — ABNORMAL HIGH (ref 5–15)
BUN: 8 mg/dL (ref 8–23)
CO2: 14 mmol/L — ABNORMAL LOW (ref 22–32)
Calcium: 9.7 mg/dL (ref 8.9–10.3)
Chloride: 108 mmol/L (ref 98–111)
Creatinine, Ser: 0.96 mg/dL (ref 0.44–1.00)
GFR, Estimated: 58 mL/min — ABNORMAL LOW (ref >60)
Glucose, Bld: 223 mg/dL — ABNORMAL HIGH (ref 70–99)
Potassium: 3.7 mmol/L (ref 3.5–5.1)
Sodium: 138 mmol/L (ref 135–145)

## 2020-12-31 LAB — MRSA NEXT GEN BY PCR, NASAL: MRSA by PCR Next Gen: NOT DETECTED

## 2020-12-31 LAB — TRIGLYCERIDES: Triglycerides: 77 mg/dL (ref <150)

## 2020-12-31 LAB — CK: Total CK: 55 U/L (ref 38–234)

## 2020-12-31 MED ORDER — POLYVINYL ALCOHOL 1.4 % OP SOLN: 1.0000 [drp] | Freq: Four times a day (QID) | OPHTHALMIC | Status: DC | PRN

## 2020-12-31 MED ORDER — GLYCOPYRROLATE 0.2 MG/ML IJ SOLN: 0.2000 mg | INTRAMUSCULAR | Status: DC | PRN

## 2020-12-31 MED ORDER — ACETAMINOPHEN 325 MG PO TABS: 650.0000 mg | ORAL_TABLET | Freq: Four times a day (QID) | ORAL | Status: DC | PRN

## 2020-12-31 MED ORDER — DIPHENHYDRAMINE HCL 50 MG/ML IJ SOLN: 25.0000 mg | INTRAMUSCULAR | Status: DC | PRN

## 2020-12-31 MED ORDER — MORPHINE SULFATE (PF) 2 MG/ML IV SOLN
2.0000 mg | INTRAVENOUS | Status: DC | PRN
  Administered 2020-12-31: 4 mg via INTRAVENOUS
  Filled 2020-12-31: qty 2

## 2020-12-31 MED ORDER — ACETAMINOPHEN 650 MG RE SUPP: 650.0000 mg | Freq: Four times a day (QID) | RECTAL | Status: DC | PRN

## 2020-12-31 MED ORDER — ACETAMINOPHEN 160 MG/5ML PO SOLN: 650.0000 mg | Freq: Four times a day (QID) | ORAL | Status: DC | PRN

## 2020-12-31 MED ORDER — GLYCOPYRROLATE 1 MG PO TABS
1.0000 mg | ORAL_TABLET | ORAL | Status: DC | PRN
  Filled 2020-12-31: qty 1

## 2020-12-31 MED ORDER — DEXTROSE 5 % IV SOLN: INTRAVENOUS | Status: DC

## 2021-01-01 NOTE — Telephone Encounter (Signed)
Noted.   Okay to recertify services. Thanks

## 2021-01-11 ENCOUNTER — Telehealth: Payer: Medicare HMO

## 2021-01-13 ENCOUNTER — Other Ambulatory Visit (HOSPITAL_COMMUNITY): Payer: Medicare HMO

## 2021-01-18 DIAGNOSIS — I10 Essential (primary) hypertension: Secondary | ICD-10-CM

## 2021-01-18 DIAGNOSIS — E782 Mixed hyperlipidemia: Secondary | ICD-10-CM

## 2021-01-19 NOTE — Progress Notes (Signed)
NAME:  Terri King, MRN:  [id number], DOB:  1935/02/15, LOS: 1 ADMISSION DATE:  01/13/2021, CONSULTATION DATE:  Tegler REFERRING MD:  Tegler MD, CHIEF COMPLAINT:  Fall   History of Present Illness:  Patient is encephalopathic and/or intubated. Therefore history has been obtained from chart review.   Terri King, is a 86 y.o. female, who presented to the [facility] ED with a chief complaint of a fall  They have a pertinent past medical history of dementia, HLD, OA, aortic stenosis S/P TAVR 08/2020, L occipital stroke  Family reports that she has had several falls this week. On 11/11 patients home health aide found her on the floor. Unclear downtime.   She was found to be hypertensive in the ED (235/55). Per documentation she was oriented x4 on presentation but later became unresponsive with a GCS of 3. She was intubated in the ED. A code stroke was called.    CT head demonstrated a large hematoma in the right temporal lobe extending into the occipital parietal lobe, concerning for hemorraghic infarction, a 59mm right SDH, IVH, and a 19 mm midline shift. ICH score 5. Neurology deemed injury as an unsurvivable head bleed.  She was made a DNR after discussion with sons Phineas Inches and Gunda Maqueda with no escalation of care. Family would like to transition the patient to comfort care on 11/12 once family is available to be at bedside.  PCCM was consulted for admission to the ICU  Pertinent  Medical History  Dementia, HLD, OA, aortic stenosis S/P TAVR 08/2020, L occipital stroke,  Significant Hospital Events: Including procedures, antibiotic start and stop dates in addition to other pertinent events   11/11 Presented to ED, Intubated, neurology consulted  Interim History / Subjective:  Family discussion with Neuro with plan for comfort care when family arrives  Objective   Blood pressure (!) 156/56, pulse 70, temperature (!) 94 F (34.4 C), temperature source Oral, resp. rate 18,  height 5\' 5"  (1.651 m), weight 70.6 kg, SpO2 100 %.    Vent Mode: PRVC FiO2 (%):  [40 %-50 %] 40 % Set Rate:  [16 bmp] 16 bmp Vt Set:  [450 mL] 450 mL PEEP:  [5 cmH20] 5 cmH20 Plateau Pressure:  [13 cmH20-14 cmH20] 13 cmH20   Intake/Output Summary (Last 24 hours) at 01-14-2021 0705 Last data filed at 01/14/21 0600 Gross per 24 hour  Intake 371.09 ml  Output 800 ml  Net -428.91 ml   Filed Weights   Jan 14, 2021 0400  Weight: 70.6 kg   Physical Exam: General: Chronic-appearing, no acute distress HENT: [place], AT, OP clear, MMM Eyes: EOMI, no scleral icterus Respiratory: Clear to auscultation bilaterally.  No crackles, wheezing or rales Cardiovascular: RRR, -M/R/G, no JVD GI: BS+, soft, nontender Extremities: Pedal edema,-tenderness Neuro: Unresponsive. Right pupil 5 mm and left pupil 2 mm, both non-reactive. No cough/gag. No withdrawal GU: Foley in place  Resolved Hospital Problem list     Assessment & Plan:  ICH with IVH- ICH score 5 R 74mm SDH Cerebral edema Hypertensive emergency Fall Endotracheally intubated- for airway protection. Hypokalemia HX Dementia HX Aortic stenosis S/P TAVR 08/2020 DNR Multiple falls over the week. On ASA/Plavix. Head bleeds seen on head CT 11/11. Deemed nonsurvivable by Dr. 13/11. Dr. Derry Lory and I separately spoke with sons Derry Lory and Tiawana Forgy and updated them on findings of head CT and prognosis. Family expressed that "she had lived a good life" and "helped a lot of people" and given the  injuries that she has that she has that she would want to pursue comfort.  P: -Continue DNR, no escalation in care. No vasoactive agents.  -Neurology consulted and following. -Continue cleviplex Goal SBP 130-150. -Continue full vent support. No SBT/WUA -LTVV strategy with tidal volumes of 4-8 cc/kg ideal body weight -Goal plateau pressures less than 30 and driving pressures less than 15 -Wean PEEP/FiO2 for SpO2 92-98% -VAP  bundle -PAD bundle with Propofol gtt and fentanyl push. Wean propofol once in ICU. -Foley for comfort. -Replete potassium -Will plan to transition to comfort care once family arrives on 11/12. Plan to extubate and allow for a natural death. Discussed with Jules Husbands and Luisa Hart.    Best Practice (right click and "Reselect all SmartList Selections" daily)   Diet/type: NPO w/ meds via tube DVT prophylaxis: SCD GI prophylaxis: H2B Lines: N/A Foley:  Yes, and it is still needed Code Status:  DNR Last date of multidisciplinary goals of care discussion [11/11 See note] No escalation of care. DNR. Family plans to transition to comfort care on arrival  Critical care time: 38 minutes    The patient is critically ill with multiple organ systems failure and requires high complexity decision making for assessment and support, frequent evaluation and titration of therapies, application of advanced monitoring technologies and extensive interpretation of multiple databases.    Mechele Collin, M.D. [facility] Pulmonary/Critical Care Medicine 01/18/2021 7:06 AM   Please see Amion for pager number to reach on-call Pulmonary and Critical Care Team.

## 2021-01-19 NOTE — Death Summary Note (Signed)
DEATH SUMMARY   Patient Details  Name: Terri King MRN: [id number] DOB: [birth year]  Admission/Discharge Information   Admit Date:  January 06, 2021  Date of Death: Date of Death: Jan 07, 2021  Time of Death: Time of Death: 06-17-52  Length of Stay: 1  Referring Physician: Tresa Garter, MD   Reason(s) for Hospitalization  Fall  Diagnoses  Preliminary cause of death: Traumatic hematoma with compression and midline shift of the brain secondary to fall in setting of Plavix Secondary Diagnoses (including complications and co-morbidities):  Active Problems:   HYPOKALEMIA   Essential hypertension   Severe aortic stenosis   S/P TAVR (transcatheter aortic valve replacement)   Dementia (HCC)   Intracranial hemorrhage (HCC)   SDH (subdural hematoma)   Hypertensive emergency   Acute hypoxemic respiratory failure (HCC)   IVH (intraventricular hemorrhage) (HCC)   Traumatic hematoma of brain with compression and midline shift of brain [facility])   Brief Hospital Course (including significant findings, care, treatment, and services provided and events leading to death)  Terri King is a 86 y.o. year old female with history of dementia, HLD, OA, aortic stenosis S/P TAVR 08/2020, L occipital stroke and recent history of multiple falls who presented to the [facility] ED with a chief complaint of a fall. She was found to be hypertensive in the ED (235/55). Per documentation she was oriented x4 on presentation but later became unresponsive with a GCS of 3. She was intubated in the ED. A code stroke was called.     CT head demonstrated a large hematoma in the right temporal lobe extending into the occipital parietal lobe, concerning for hemorraghic infarction, a 34mm right SDH, IVH, and a 19 mm midline shift. ICH score 5. Neurology deemed injury as an unsurvivable head bleed.   She was made a DNR after discussion with sons Phineas Inches and Sathvika Takayama with no escalation of care. Family would like to  transition the patient to comfort care on Jan 08, 2023 once family is available to be at bedside.  After family arrived, she was transitioned to comfort care. She expired at 18:54   Pertinent Labs and Studies  Significant Diagnostic Studies DG Chest Portable 1 View  Result Date: 01/06/21 CLINICAL DATA:  Tube placement. EXAM: PORTABLE CHEST 1 VIEW COMPARISON:  Chest x-ray 08/29/2020. FINDINGS: Endotracheal tube tip is approximately 4.6 cm above the carina. Enteric tube extends below the diaphragm. There is a new aortic stent/graft in place. The cardiomediastinal silhouette is within normal limits. The lungs and costophrenic angles are clear. There is no pneumothorax. No acute fractures are seen. There are severe degenerative changes of both shoulders. IMPRESSION: 1. Endotracheal tube tip 4.6 cm above the carina. 2. The lungs are clear. Electronically Signed   By: Darliss Cheney M.D.   On: 06-Jan-2021 20:22   DG Knee Complete 4 Views Left  Result Date: Jan 06, 2021 CLINICAL DATA:  Fall, knee pain. EXAM: RIGHT KNEE - COMPLETE 4+ VIEW; LEFT KNEE - COMPLETE 4+ VIEW COMPARISON:  Left knee x-ray 08/19/2020. FINDINGS: Left knee: Left knee arthroplasty appears in anatomic alignment. There is no evidence for hardware loosening or acute fracture. Soft tissues are within normal limits. The bones are diffusely osteopenic. Right knee: The bones are diffusely osteopenic. Large joint effusion is present. There is no definite acute fracture or dislocation. There is moderate severe medial compartment joint space narrowing and moderate severe patellofemoral compartment joint space narrowing. There is tricompartmental osteophyte formation and spurring of the tibial spines. IMPRESSION: 1. No definite acute  fracture or dislocation of the bilateral knees. 2. Large joint effusion right knee. 3. Left knee arthroplasty in anatomic alignment. 4. Severe degenerative changes of the right knee. Electronically Signed   By: Darliss Cheney  M.D.   On: 01-05-21 15:33   DG Knee Complete 4 Views Right  Result Date: 01/05/2021 CLINICAL DATA:  Fall, knee pain. EXAM: RIGHT KNEE - COMPLETE 4+ VIEW; LEFT KNEE - COMPLETE 4+ VIEW COMPARISON:  Left knee x-ray 08/19/2020. FINDINGS: Left knee: Left knee arthroplasty appears in anatomic alignment. There is no evidence for hardware loosening or acute fracture. Soft tissues are within normal limits. The bones are diffusely osteopenic. Right knee: The bones are diffusely osteopenic. Large joint effusion is present. There is no definite acute fracture or dislocation. There is moderate severe medial compartment joint space narrowing and moderate severe patellofemoral compartment joint space narrowing. There is tricompartmental osteophyte formation and spurring of the tibial spines. IMPRESSION: 1. No definite acute fracture or dislocation of the bilateral knees. 2. Large joint effusion right knee. 3. Left knee arthroplasty in anatomic alignment. 4. Severe degenerative changes of the right knee. Electronically Signed   By: Darliss Cheney M.D.   On: January 05, 2021 15:33   CT HEAD CODE STROKE WO CONTRAST  Result Date: 01-05-21 CLINICAL DATA:  Code stroke.  Acute neuro deficit EXAM: CT HEAD WITHOUT CONTRAST TECHNIQUE: Contiguous axial images were obtained from the base of the skull through the vertex without intravenous contrast. COMPARISON:  08/19/2020 FINDINGS: Brain: Large intracranial hemorrhage. Large parenchymal hemorrhage in the right temporal and occipital parietal measuring approximately 9 x 5 cm. There is surrounding edema in the brain. This hemorrhage has extended into the right lateral ventricle where there is a large ventricular hematoma also extending into the left lateral ventricle. There is 19 mm midline shift to the left. Left ventricle is dilating appears trapped. There is some white matter edema surrounding the dilated left lateral ventricle. Right-sided high-density subdural hematoma measures 13  mm in thickness and also contributes to the midline shift. There is also subdural hemorrhage along the right tentorium and floor of the middle cranial fossa Vascular: Negative for hyperdense vessel Skull: Negative Sinuses/Orbits: Negative Other: None ASPECTS ([place] Stroke Program Early CT Score) Not calculated due to intracranial hemorrhage. IMPRESSION: 1. Large hematoma in the right temporal lobe extending into the occipital parietal lobe. This may be hemorrhagic infarction. Patient is on aspirin and Plavix. There is a large intraventricular hematoma with dilated and trapped left lateral ventricle. 2. Right-sided subdural hematoma 13 mm 3. 19 mm midline shift to the left. Neuro surgical consultation recommended. 4. These results were called by telephone at the time of interpretation on 2021-01-05 at 8:36 pm to provider Derry Lory , who verbally acknowledged these results. Electronically Signed   By: Marlan Palau M.D.   On: January 05, 2021 20:37    Microbiology Recent Results (from the past 240 hour(s))  Resp Panel by RT-PCR (Flu A&B, Covid) Nasopharyngeal Swab     Status: None   Collection Time: 01-05-21  8:45 PM   Specimen: Nasopharyngeal Swab; Nasopharyngeal(NP) swabs in vial transport medium  Result Value Ref Range Status   SARS Coronavirus 2 by RT PCR NEGATIVE NEGATIVE Final    Comment: (NOTE) SARS-CoV-2 target nucleic acids are NOT DETECTED.  The SARS-CoV-2 RNA is generally detectable in upper respiratory specimens during the acute phase of infection. The lowest concentration of SARS-CoV-2 viral copies this assay can detect is 138 copies/mL. A negative result does not preclude SARS-Cov-2 infection and  should not be used as the sole basis for treatment or other patient management decisions. A negative result may occur with  improper specimen collection/handling, submission of specimen other than nasopharyngeal swab, presence of viral mutation(s) within the areas targeted by this assay, and  inadequate number of viral copies(<138 copies/mL). A negative result must be combined with clinical observations, patient history, and epidemiological information. The expected result is Negative.  Fact Sheet for Patients:  BloggerCourse.com  Fact Sheet for Healthcare Providers:  SeriousBroker.it  This test is no t yet approved or cleared by the [place] FDA and  has been authorized for detection and/or diagnosis of SARS-CoV-2 by FDA under an Emergency Use Authorization (EUA). This EUA will remain  in effect (meaning this test can be used) for the duration of the COVID-19 declaration under Section 564(b)(1) of the Act, 21 U.S.C.section 360bbb-3(b)(1), unless the authorization is terminated  or revoked sooner.       Influenza A by PCR NEGATIVE NEGATIVE Final   Influenza B by PCR NEGATIVE NEGATIVE Final    Comment: (NOTE) The Xpert Xpress SARS-CoV-2/FLU/RSV plus assay is intended as an aid in the diagnosis of influenza from Nasopharyngeal swab specimens and should not be used as a sole basis for treatment. Nasal washings and aspirates are unacceptable for Xpert Xpress SARS-CoV-2/FLU/RSV testing.  Fact Sheet for Patients: BloggerCourse.com  Fact Sheet for Healthcare Providers: SeriousBroker.it  This test is not yet approved or cleared by the [place] FDA and has been authorized for detection and/or diagnosis of SARS-CoV-2 by FDA under an Emergency Use Authorization (EUA). This EUA will remain in effect (meaning this test can be used) for the duration of the COVID-19 declaration under Section 564(b)(1) of the Act, 21 U.S.C. section 360bbb-3(b)(1), unless the authorization is terminated or revoked.  Performed at [facility], [address] [place], [place] [address]   MRSA Next Gen by PCR, Nasal     Status: None   Collection Time: 01/12/2021 11:01 PM    Specimen: Nasal Mucosa; Nasal Swab  Result Value Ref Range Status   MRSA by PCR Next Gen NOT DETECTED NOT DETECTED Final    Comment: (NOTE) The GeneXpert MRSA Assay (FDA approved for NASAL specimens only), is one component of a comprehensive MRSA colonization surveillance program. It is not intended to diagnose MRSA infection nor to guide or monitor treatment for MRSA infections. Test performance is not FDA approved in patients less than 17 years old. Performed at [facility], [address] [place], [place] [address]     Lab Basic Metabolic Panel: Recent Labs  Lab 12/27/20 1613 01/08/2021 1442 01/10/2021 2011 01/15/2021 2021 12/24/2020 2207 January 29, 2021 0351  NA 139 138 140 137 138 138  K 4.1 3.4* 2.8* 2.8* 2.7* 3.7  CL 106 105 103 104  --  108  CO2 26 25  --  19*  --  14*  GLUCOSE 101* 101* 202* 201*  --  223*  BUN 10 6* 6* 6*  --  8  CREATININE 0.87 0.68 0.60 0.69  --  0.96  CALCIUM 9.8 9.8  --  9.4  --  9.7   Liver Function Tests: Recent Labs  Lab 12/27/20 1613 01/13/2021 1442 12/25/2020 2021  AST 25 28 35  ALT 13 21 17   ALKPHOS 101 106 108  BILITOT 0.8 0.8 1.0  PROT 6.7 6.8 6.6  ALBUMIN 4.0 3.6 3.6   No results for input(s): LIPASE, AMYLASE in the last 168 hours. No results  for input(s): AMMONIA in the last 168 hours. CBC: Recent Labs  Lab 12/20/2020 1442 12/21/2020 2011 01/06/2021 2021 01/02/2021 2207  WBC 8.1  --  18.2*  --   NEUTROABS 5.7  --  14.7*  --   HGB 13.6 13.6 13.3 11.9*  HCT 39.9 40.0 40.0 35.0*  MCV 96.6  --  99.8  --   PLT 269  --  265  --    Cardiac Enzymes: Recent Labs  Lab Jan 12, 2021 0351  CKTOTAL 55   Sepsis Labs: Recent Labs  Lab 12/25/2020 1442 01/11/2021 2021  WBC 8.1 18.2*    Procedures/Operations  ETT 01/16/2021   Husein Guedes Mechele Collin 2021/01/12, 6:57 PM

## 2021-01-19 NOTE — Progress Notes (Signed)
Pt extubated to comfort care per Order. RN at beside, MD aware,

## 2021-01-19 NOTE — Progress Notes (Signed)
Informed CCM MD of patient's pulse pressure widening and some ST-elevation. Per MD, continue to monitor.

## 2021-01-19 DEATH — deceased

## 2021-01-20 ENCOUNTER — Telehealth: Payer: Medicare HMO

## 2021-01-26 ENCOUNTER — Ambulatory Visit: Payer: Medicare HMO | Admitting: Internal Medicine

## 2021-02-07 ENCOUNTER — Ambulatory Visit: Payer: Medicare HMO | Admitting: Adult Health

## 2021-02-08 ENCOUNTER — Ambulatory Visit: Payer: Medicare HMO | Admitting: Physician Assistant

## 2021-02-09 ENCOUNTER — Ambulatory Visit: Payer: Medicare HMO | Admitting: Internal Medicine

## 2022-01-05 IMAGING — CT CT KNEE*L* W/O CM
3 of 4 series · 12 of 35 positions shown, 14 images · non-contrast
Comparison: Radiographs 08/19/2020

CLINICAL DATA: Fell 1 month ago.  Left knee pain.

EXAM:
CT OF THE left KNEE WITHOUT CONTRAST
TECHNIQUE: Multidetector CT imaging of the left knee was performed according to
the standard protocol. Multiplanar CT image reconstructions were
also generated.

[Series 6: lfov lower extremity 0.60 br40 s3 soft thin · axial · 0.35mm/px · z∈[+1128,+1294]mm · 4 of 462 slices shown, 5 images]
[im 93/462  soft-tissue]
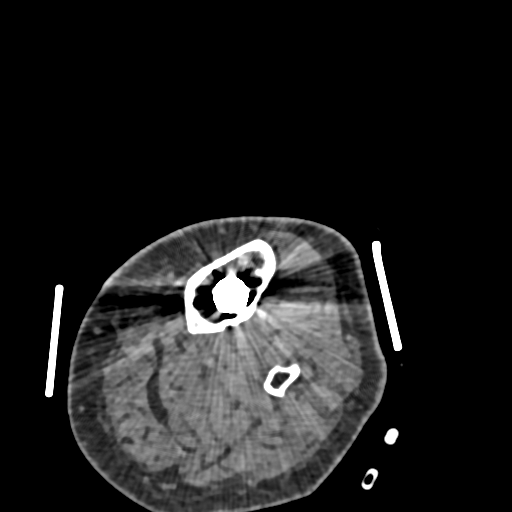
[im 93/462  bone]
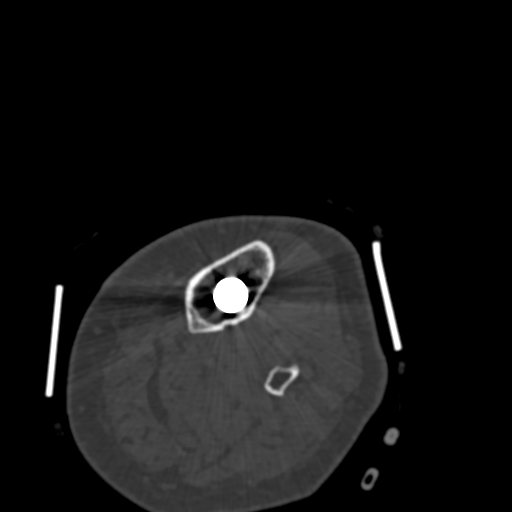
[im 185/462  bone]
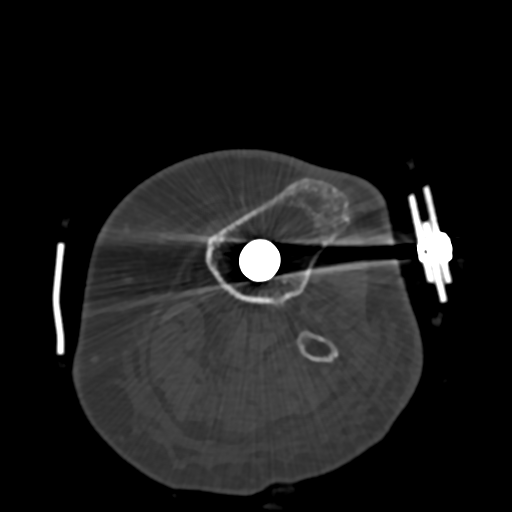
[im 277/462  bone]
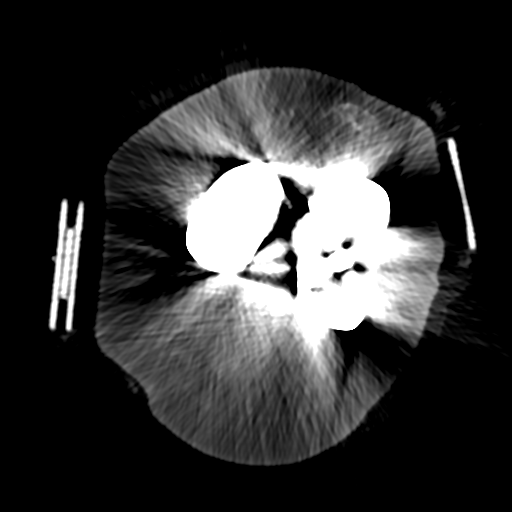
[im 369/462  bone]
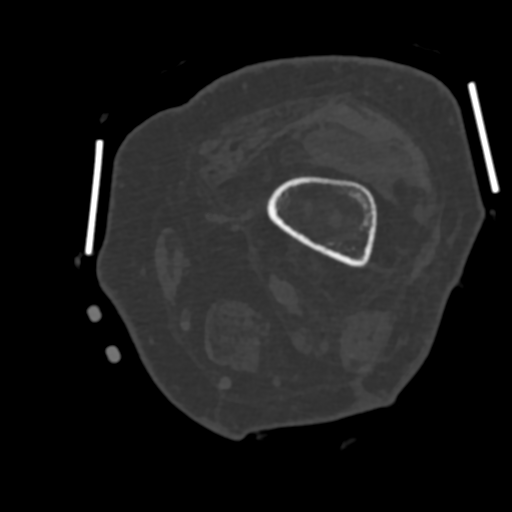

[Series 13: lfov lower extremity 2.00 br40 s3 soft · coronal · 0.35mm/px · 3 of 95 slices shown (1 of 2)]
[im 19/95  bone]
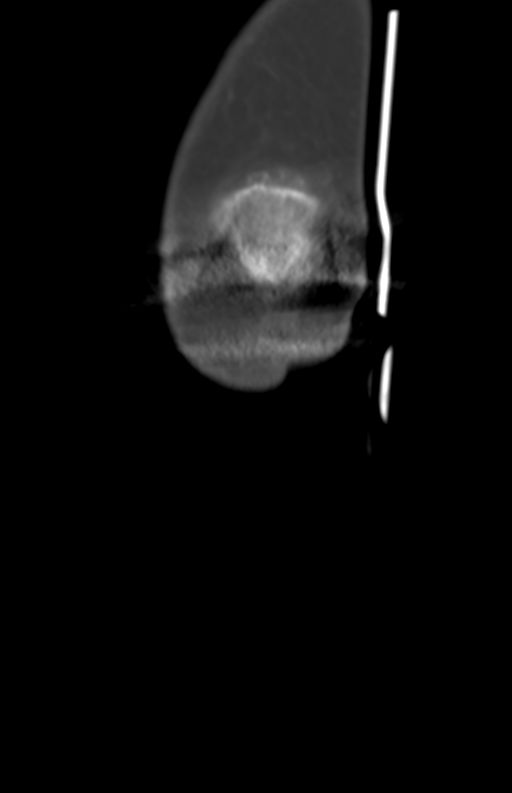
[im 38/95  bone]
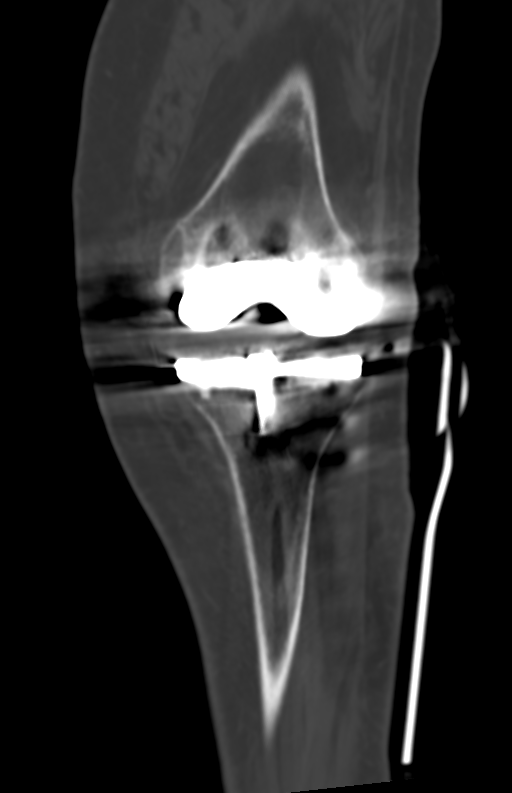
[im 57/95  bone]
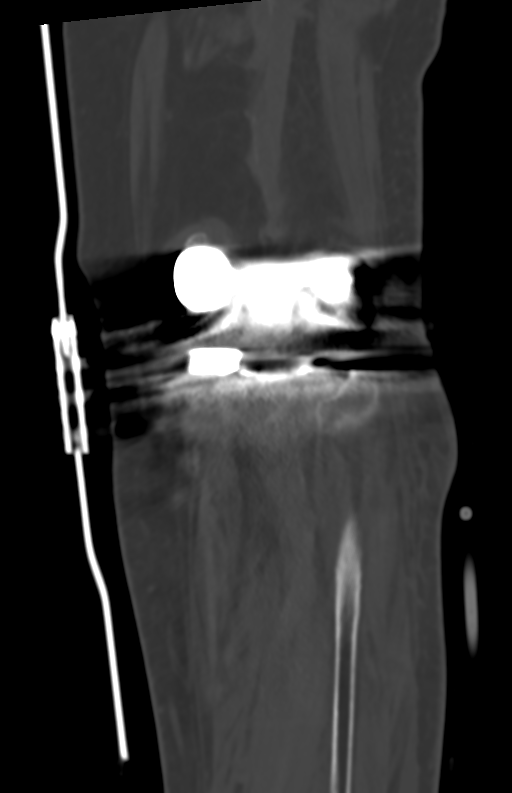

[Series 17: lfov lower extremity 2.00 br40 s3 soft · sagittal · 0.35mm/px · 5 of 89 slices shown, 6 images (2 of 2)]
[im 30/89  bone]
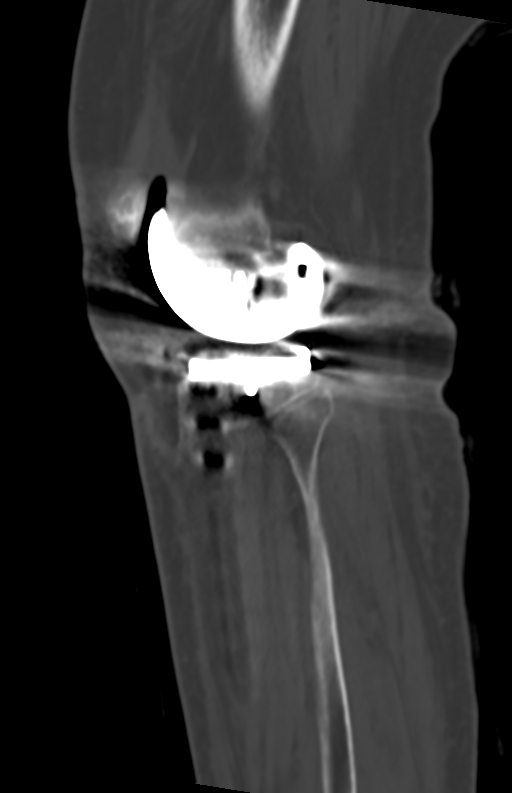
[im 37/89  bone]
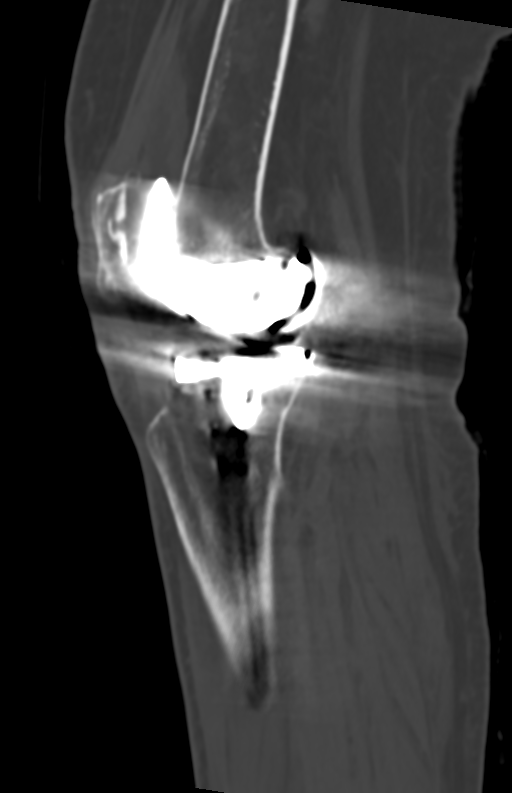
[im 45/89  soft-tissue]
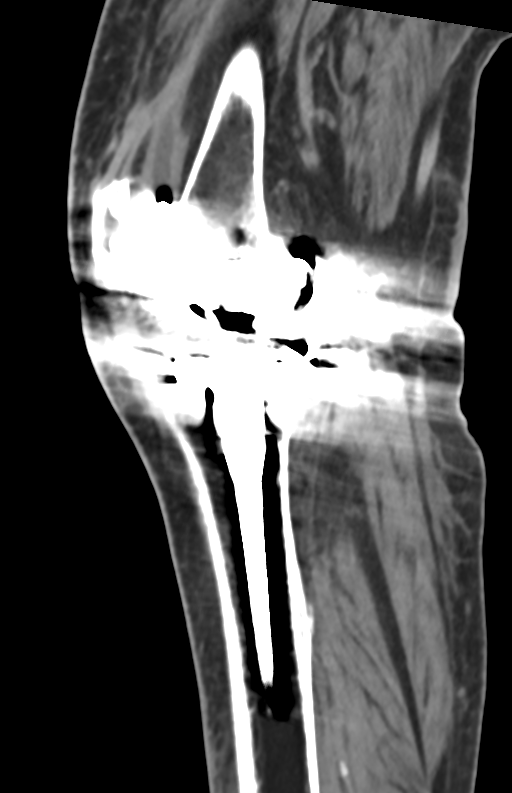
[im 45/89  bone]
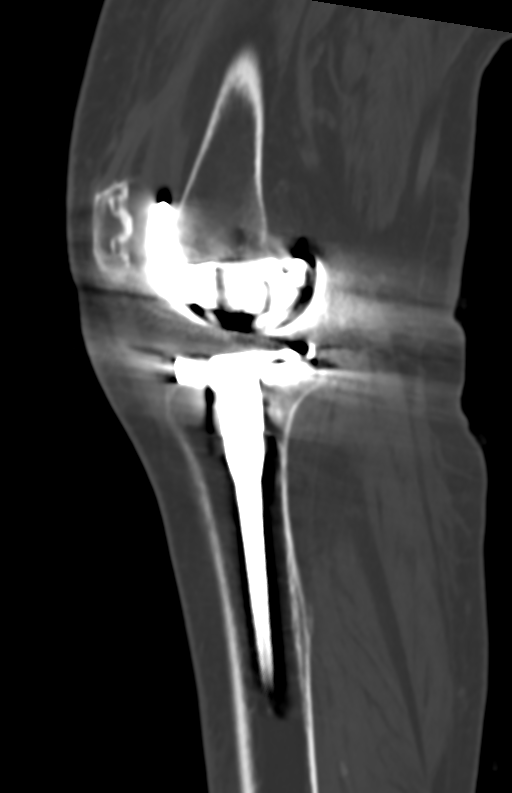
[im 52/89  bone]
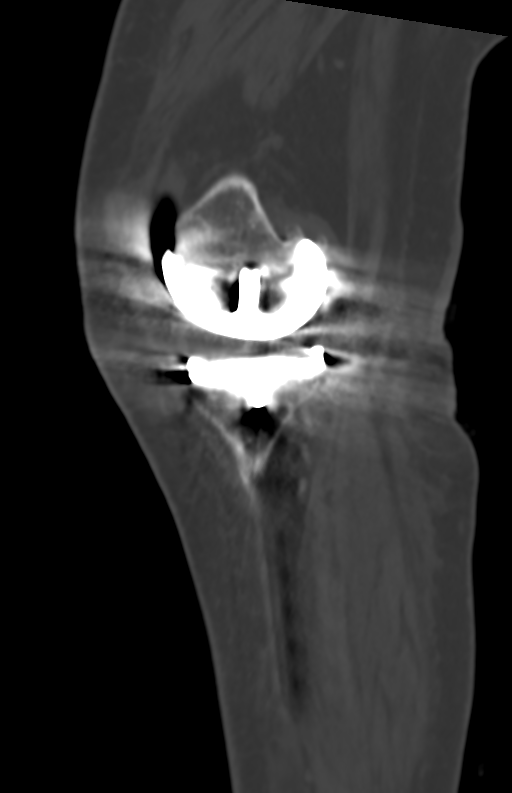
[im 59/89  bone]
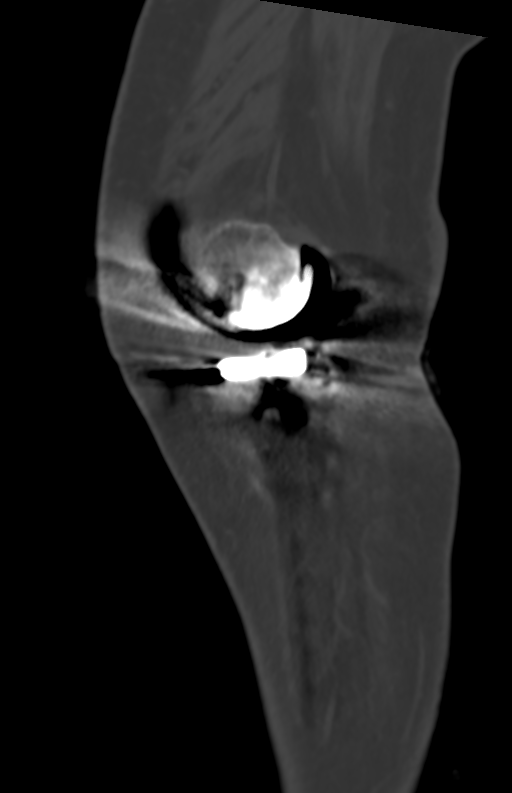

[12 of 35 positions shown; findings below may reference images not displayed]

FINDINGS: The femoral and tibial components are well seated. I do not see any
obvious complicating features such as loosening or periprosthetic
fracture.

On the axial and coronal images there is a small cortical fracture
involving the medial femoral condyle in the epicondylar region
anteriorly. This appears to be partially healed.

The patella is intact. The quadriceps and patellar tendons are
grossly intact. There is a moderate-sized suprapatellar knee joint
effusion with associated synovial thickening.
IMPRESSION: 1. Small partially healed cortical fracture involving the medial
femoral condyle in the epicondylar region anteriorly.
2. No complicating features associated with the total knee
arthroplasty components.
3. Moderate-sized suprapatellar knee joint effusion with associated
synovial thickening.
